# Patient Record
Sex: Female | Born: 1965 | Race: Black or African American | Hispanic: No | Marital: Single | Smoking: Current some day smoker
Health system: Southern US, Community
[De-identification: ages and names within clinical notes are randomized; demographics above are authoritative.]

## PROBLEM LIST (undated history)

## (undated) ENCOUNTER — Emergency Department (HOSPITAL_COMMUNITY): Payer: Self-pay

## (undated) DIAGNOSIS — I509 Heart failure, unspecified: Secondary | ICD-10-CM

## (undated) DIAGNOSIS — D573 Sickle-cell trait: Secondary | ICD-10-CM

## (undated) DIAGNOSIS — F329 Major depressive disorder, single episode, unspecified: Secondary | ICD-10-CM

## (undated) DIAGNOSIS — E669 Obesity, unspecified: Secondary | ICD-10-CM

## (undated) DIAGNOSIS — I639 Cerebral infarction, unspecified: Secondary | ICD-10-CM

## (undated) DIAGNOSIS — I10 Essential (primary) hypertension: Secondary | ICD-10-CM

## (undated) DIAGNOSIS — F32A Depression, unspecified: Secondary | ICD-10-CM

## (undated) HISTORY — PX: NO PAST SURGERIES: SHX2092

---

## 1998-05-12 ENCOUNTER — Emergency Department (HOSPITAL_COMMUNITY): Admission: EM | Admit: 1998-05-12 | Discharge: 1998-05-12 | Payer: Self-pay | Admitting: Emergency Medicine

## 1998-07-18 ENCOUNTER — Emergency Department (HOSPITAL_COMMUNITY): Admission: EM | Admit: 1998-07-18 | Discharge: 1998-07-18 | Payer: Self-pay | Admitting: Emergency Medicine

## 1998-09-05 ENCOUNTER — Emergency Department (HOSPITAL_COMMUNITY): Admission: EM | Admit: 1998-09-05 | Discharge: 1998-09-05 | Payer: Self-pay | Admitting: Emergency Medicine

## 1999-11-03 ENCOUNTER — Emergency Department (HOSPITAL_COMMUNITY): Admission: EM | Admit: 1999-11-03 | Discharge: 1999-11-03 | Payer: Self-pay | Admitting: Emergency Medicine

## 1999-11-11 ENCOUNTER — Emergency Department (HOSPITAL_COMMUNITY): Admission: EM | Admit: 1999-11-11 | Discharge: 1999-11-11 | Payer: Self-pay | Admitting: Emergency Medicine

## 1999-12-15 ENCOUNTER — Emergency Department (HOSPITAL_COMMUNITY): Admission: EM | Admit: 1999-12-15 | Discharge: 1999-12-15 | Payer: Self-pay | Admitting: Emergency Medicine

## 2000-01-29 ENCOUNTER — Emergency Department (HOSPITAL_COMMUNITY): Admission: EM | Admit: 2000-01-29 | Discharge: 2000-01-29 | Payer: Self-pay | Admitting: Emergency Medicine

## 2000-10-16 ENCOUNTER — Emergency Department (HOSPITAL_COMMUNITY): Admission: EM | Admit: 2000-10-16 | Discharge: 2000-10-16 | Payer: Self-pay | Admitting: Emergency Medicine

## 2000-10-23 ENCOUNTER — Encounter: Payer: Self-pay | Admitting: Emergency Medicine

## 2000-10-23 ENCOUNTER — Emergency Department (HOSPITAL_COMMUNITY): Admission: EM | Admit: 2000-10-23 | Discharge: 2000-10-23 | Payer: Self-pay | Admitting: Emergency Medicine

## 2001-01-07 ENCOUNTER — Emergency Department (HOSPITAL_COMMUNITY): Admission: EM | Admit: 2001-01-07 | Discharge: 2001-01-07 | Payer: Self-pay | Admitting: Emergency Medicine

## 2001-01-07 ENCOUNTER — Encounter: Payer: Self-pay | Admitting: Emergency Medicine

## 2001-01-09 ENCOUNTER — Emergency Department (HOSPITAL_COMMUNITY): Admission: EM | Admit: 2001-01-09 | Discharge: 2001-01-09 | Payer: Self-pay | Admitting: Emergency Medicine

## 2001-01-09 ENCOUNTER — Encounter: Payer: Self-pay | Admitting: Emergency Medicine

## 2001-07-29 ENCOUNTER — Emergency Department (HOSPITAL_COMMUNITY): Admission: EM | Admit: 2001-07-29 | Discharge: 2001-07-29 | Payer: Self-pay | Admitting: Emergency Medicine

## 2001-07-29 ENCOUNTER — Encounter: Payer: Self-pay | Admitting: Emergency Medicine

## 2001-10-21 ENCOUNTER — Emergency Department (HOSPITAL_COMMUNITY): Admission: EM | Admit: 2001-10-21 | Discharge: 2001-10-22 | Payer: Self-pay | Admitting: Emergency Medicine

## 2001-10-22 ENCOUNTER — Encounter: Payer: Self-pay | Admitting: Emergency Medicine

## 2002-06-15 ENCOUNTER — Emergency Department (HOSPITAL_COMMUNITY): Admission: EM | Admit: 2002-06-15 | Discharge: 2002-06-15 | Payer: Self-pay | Admitting: *Deleted

## 2002-06-23 ENCOUNTER — Encounter: Payer: Self-pay | Admitting: Emergency Medicine

## 2002-06-23 ENCOUNTER — Emergency Department (HOSPITAL_COMMUNITY): Admission: EM | Admit: 2002-06-23 | Discharge: 2002-06-23 | Payer: Self-pay | Admitting: Emergency Medicine

## 2002-06-29 ENCOUNTER — Emergency Department (HOSPITAL_COMMUNITY): Admission: EM | Admit: 2002-06-29 | Discharge: 2002-06-29 | Payer: Self-pay | Admitting: *Deleted

## 2002-07-01 ENCOUNTER — Emergency Department (HOSPITAL_COMMUNITY): Admission: EM | Admit: 2002-07-01 | Discharge: 2002-07-01 | Payer: Self-pay | Admitting: Emergency Medicine

## 2004-10-24 ENCOUNTER — Emergency Department (HOSPITAL_COMMUNITY): Admission: EM | Admit: 2004-10-24 | Discharge: 2004-10-24 | Payer: Self-pay | Admitting: Emergency Medicine

## 2005-05-15 ENCOUNTER — Emergency Department (HOSPITAL_COMMUNITY): Admission: EM | Admit: 2005-05-15 | Discharge: 2005-05-15 | Payer: Self-pay | Admitting: Emergency Medicine

## 2006-06-01 ENCOUNTER — Emergency Department (HOSPITAL_COMMUNITY): Admission: EM | Admit: 2006-06-01 | Discharge: 2006-06-01 | Payer: Self-pay | Admitting: Emergency Medicine

## 2006-06-04 ENCOUNTER — Emergency Department (HOSPITAL_COMMUNITY): Admission: EM | Admit: 2006-06-04 | Discharge: 2006-06-04 | Payer: Self-pay | Admitting: Emergency Medicine

## 2006-06-26 ENCOUNTER — Emergency Department (HOSPITAL_COMMUNITY): Admission: EM | Admit: 2006-06-26 | Discharge: 2006-06-26 | Payer: Self-pay | Admitting: Emergency Medicine

## 2006-09-26 ENCOUNTER — Emergency Department (HOSPITAL_COMMUNITY): Admission: EM | Admit: 2006-09-26 | Discharge: 2006-09-27 | Payer: Self-pay | Admitting: Emergency Medicine

## 2007-01-12 ENCOUNTER — Emergency Department (HOSPITAL_COMMUNITY): Admission: EM | Admit: 2007-01-12 | Discharge: 2007-01-12 | Payer: Self-pay | Admitting: Emergency Medicine

## 2007-04-27 ENCOUNTER — Emergency Department (HOSPITAL_COMMUNITY): Admission: EM | Admit: 2007-04-27 | Discharge: 2007-04-27 | Payer: Self-pay | Admitting: Emergency Medicine

## 2007-09-29 ENCOUNTER — Emergency Department (HOSPITAL_COMMUNITY): Admission: EM | Admit: 2007-09-29 | Discharge: 2007-09-29 | Payer: Self-pay | Admitting: Emergency Medicine

## 2007-10-06 ENCOUNTER — Ambulatory Visit: Payer: Self-pay | Admitting: *Deleted

## 2007-11-14 ENCOUNTER — Ambulatory Visit: Payer: Self-pay | Admitting: Internal Medicine

## 2007-11-14 ENCOUNTER — Encounter (INDEPENDENT_AMBULATORY_CARE_PROVIDER_SITE_OTHER): Payer: Self-pay | Admitting: Nurse Practitioner

## 2007-11-14 LAB — CONVERTED CEMR LAB
BUN: 19 mg/dL (ref 6–23)
CO2: 20 meq/L (ref 19–32)
Eosinophils Relative: 3 % (ref 0–5)
Glucose, Bld: 103 mg/dL — ABNORMAL HIGH (ref 70–99)
HCT: 40.4 % (ref 36.0–46.0)
Hemoglobin: 14 g/dL (ref 12.0–15.0)
Lymphocytes Relative: 40 % (ref 12–46)
Lymphs Abs: 2.8 10*3/uL (ref 0.7–4.0)
Monocytes Absolute: 0.8 10*3/uL (ref 0.1–1.0)
Monocytes Relative: 11 % (ref 3–12)
Platelets: 241 10*3/uL (ref 150–400)
RBC: 4.69 M/uL (ref 3.87–5.11)
Sodium: 141 meq/L (ref 135–145)
TSH: 1.061 microintl units/mL (ref 0.350–5.50)
Total Bilirubin: 0.2 mg/dL — ABNORMAL LOW (ref 0.3–1.2)
Total Protein: 6.9 g/dL (ref 6.0–8.3)
WBC: 6.9 10*3/uL (ref 4.0–10.5)

## 2007-11-25 ENCOUNTER — Ambulatory Visit (HOSPITAL_COMMUNITY): Admission: RE | Admit: 2007-11-25 | Discharge: 2007-11-25 | Payer: Self-pay | Admitting: Family Medicine

## 2007-11-27 ENCOUNTER — Emergency Department (HOSPITAL_COMMUNITY): Admission: EM | Admit: 2007-11-27 | Discharge: 2007-11-27 | Payer: Self-pay | Admitting: Emergency Medicine

## 2008-02-02 ENCOUNTER — Ambulatory Visit: Payer: Self-pay | Admitting: Internal Medicine

## 2008-02-09 ENCOUNTER — Ambulatory Visit: Payer: Self-pay | Admitting: Internal Medicine

## 2008-02-18 ENCOUNTER — Ambulatory Visit: Payer: Self-pay | Admitting: Internal Medicine

## 2008-05-04 ENCOUNTER — Encounter: Admission: RE | Admit: 2008-05-04 | Discharge: 2008-05-04 | Payer: Self-pay | Admitting: Family Medicine

## 2008-08-14 ENCOUNTER — Emergency Department (HOSPITAL_COMMUNITY): Admission: EM | Admit: 2008-08-14 | Discharge: 2008-08-14 | Payer: Self-pay | Admitting: Emergency Medicine

## 2008-09-04 ENCOUNTER — Emergency Department (HOSPITAL_COMMUNITY): Admission: EM | Admit: 2008-09-04 | Discharge: 2008-09-04 | Payer: Self-pay | Admitting: Emergency Medicine

## 2008-09-07 ENCOUNTER — Ambulatory Visit: Payer: Self-pay | Admitting: Family Medicine

## 2008-10-19 ENCOUNTER — Emergency Department (HOSPITAL_COMMUNITY): Admission: EM | Admit: 2008-10-19 | Discharge: 2008-10-19 | Payer: Self-pay | Admitting: Emergency Medicine

## 2008-11-04 ENCOUNTER — Encounter: Payer: Self-pay | Admitting: Family Medicine

## 2008-11-04 ENCOUNTER — Other Ambulatory Visit: Admission: RE | Admit: 2008-11-04 | Discharge: 2008-11-04 | Payer: Self-pay | Admitting: Obstetrics and Gynecology

## 2008-11-04 ENCOUNTER — Ambulatory Visit: Payer: Self-pay | Admitting: Obstetrics and Gynecology

## 2008-11-10 ENCOUNTER — Ambulatory Visit (HOSPITAL_COMMUNITY): Admission: RE | Admit: 2008-11-10 | Discharge: 2008-11-10 | Payer: Self-pay | Admitting: Obstetrics and Gynecology

## 2008-12-15 ENCOUNTER — Encounter: Admission: RE | Admit: 2008-12-15 | Discharge: 2008-12-15 | Payer: Self-pay | Admitting: Family Medicine

## 2009-01-14 ENCOUNTER — Emergency Department (HOSPITAL_COMMUNITY): Admission: EM | Admit: 2009-01-14 | Discharge: 2009-01-14 | Payer: Self-pay | Admitting: Emergency Medicine

## 2009-03-15 ENCOUNTER — Emergency Department (HOSPITAL_COMMUNITY): Admission: EM | Admit: 2009-03-15 | Discharge: 2009-03-16 | Payer: Self-pay | Admitting: Emergency Medicine

## 2009-03-16 ENCOUNTER — Inpatient Hospital Stay (HOSPITAL_COMMUNITY): Admission: AD | Admit: 2009-03-16 | Discharge: 2009-03-16 | Payer: Self-pay | Admitting: Obstetrics and Gynecology

## 2009-03-16 ENCOUNTER — Ambulatory Visit: Payer: Self-pay | Admitting: Obstetrics and Gynecology

## 2009-05-02 ENCOUNTER — Emergency Department (HOSPITAL_COMMUNITY): Admission: EM | Admit: 2009-05-02 | Discharge: 2009-05-02 | Payer: Self-pay | Admitting: Emergency Medicine

## 2009-05-05 ENCOUNTER — Ambulatory Visit: Payer: Self-pay | Admitting: Family Medicine

## 2009-08-07 ENCOUNTER — Emergency Department (HOSPITAL_COMMUNITY): Admission: EM | Admit: 2009-08-07 | Discharge: 2009-08-07 | Payer: Self-pay | Admitting: Emergency Medicine

## 2009-09-27 ENCOUNTER — Emergency Department (HOSPITAL_COMMUNITY): Admission: EM | Admit: 2009-09-27 | Discharge: 2009-09-27 | Payer: Self-pay | Admitting: Emergency Medicine

## 2010-03-23 ENCOUNTER — Emergency Department (HOSPITAL_COMMUNITY): Admission: EM | Admit: 2010-03-23 | Discharge: 2010-03-23 | Payer: Self-pay | Admitting: Emergency Medicine

## 2010-03-29 ENCOUNTER — Emergency Department (HOSPITAL_COMMUNITY): Admission: EM | Admit: 2010-03-29 | Discharge: 2010-03-29 | Payer: Self-pay | Admitting: Emergency Medicine

## 2010-04-08 ENCOUNTER — Emergency Department (HOSPITAL_COMMUNITY): Admission: EM | Admit: 2010-04-08 | Discharge: 2010-04-08 | Payer: Self-pay | Admitting: Emergency Medicine

## 2010-11-12 ENCOUNTER — Encounter: Payer: Self-pay | Admitting: Family Medicine

## 2010-11-13 ENCOUNTER — Encounter: Payer: Self-pay | Admitting: Family Medicine

## 2011-01-08 LAB — POCT I-STAT, CHEM 8
Calcium, Ion: 1.1 mmol/L — ABNORMAL LOW (ref 1.12–1.32)
Chloride: 109 mEq/L (ref 96–112)
HCT: 42 % (ref 36.0–46.0)
Hemoglobin: 14.3 g/dL (ref 12.0–15.0)
Potassium: 3.9 mEq/L (ref 3.5–5.1)
Sodium: 142 mEq/L (ref 135–145)
TCO2: 26 mmol/L (ref 0–100)

## 2011-01-08 LAB — RAPID URINE DRUG SCREEN, HOSP PERFORMED
Amphetamines: NOT DETECTED
Tetrahydrocannabinol: NOT DETECTED

## 2011-01-08 LAB — POCT CARDIAC MARKERS: Myoglobin, poc: 74.1 ng/mL (ref 12–200)

## 2011-01-08 LAB — CBC
MCHC: 33.6 g/dL (ref 30.0–36.0)
MCV: 87.4 fL (ref 78.0–100.0)
Platelets: 201 10*3/uL (ref 150–400)
WBC: 7.9 10*3/uL (ref 4.0–10.5)

## 2011-01-08 LAB — DIFFERENTIAL
Basophils Relative: 1 % (ref 0–1)
Eosinophils Absolute: 0.2 10*3/uL (ref 0.0–0.7)
Neutrophils Relative %: 46 % (ref 43–77)

## 2011-01-08 LAB — D-DIMER, QUANTITATIVE: D-Dimer, Quant: 0.33 ug/mL-FEU (ref 0.00–0.48)

## 2011-01-30 LAB — URINE MICROSCOPIC-ADD ON

## 2011-01-30 LAB — DIFFERENTIAL
Basophils Absolute: 0 10*3/uL (ref 0.0–0.1)
Eosinophils Relative: 4 % (ref 0–5)
Lymphocytes Relative: 38 % (ref 12–46)
Lymphs Abs: 3.7 10*3/uL (ref 0.7–4.0)
Neutro Abs: 4.8 10*3/uL (ref 1.7–7.7)
Neutrophils Relative %: 50 % (ref 43–77)

## 2011-01-30 LAB — URINALYSIS, ROUTINE W REFLEX MICROSCOPIC
Ketones, ur: NEGATIVE mg/dL
Leukocytes, UA: NEGATIVE
Nitrite: NEGATIVE
Specific Gravity, Urine: 1.015 (ref 1.005–1.030)
Urobilinogen, UA: 0.2 mg/dL (ref 0.0–1.0)
pH: 5.5 (ref 5.0–8.0)

## 2011-01-30 LAB — POCT CARDIAC MARKERS
Myoglobin, poc: 80 ng/mL (ref 12–200)
Troponin i, poc: <0.05 ng/mL (ref 0.00–0.09)

## 2011-01-30 LAB — POCT I-STAT, CHEM 8
BUN: 15 mg/dL (ref 6–23)
Chloride: 109 mEq/L (ref 96–112)
HCT: 44 % (ref 36.0–46.0)
Potassium: 3.7 mEq/L (ref 3.5–5.1)
Sodium: 139 mEq/L (ref 135–145)

## 2011-01-30 LAB — CBC
HCT: 43 % (ref 36.0–46.0)
Platelets: 250 10*3/uL (ref 150–400)
WBC: 9.6 10*3/uL (ref 4.0–10.5)

## 2011-01-30 LAB — WET PREP, GENITAL: WBC, Wet Prep HPF POC: NONE SEEN

## 2011-01-30 LAB — URINE CULTURE: Colony Count: 4000

## 2011-01-30 LAB — POCT PREGNANCY, URINE: Preg Test, Ur: NEGATIVE

## 2011-01-30 LAB — LIPASE, BLOOD: Lipase: 21 U/L (ref 11–59)

## 2011-02-01 LAB — COMPREHENSIVE METABOLIC PANEL
AST: 20 U/L (ref 0–37)
Albumin: 3.4 g/dL — ABNORMAL LOW (ref 3.5–5.2)
BUN: 9 mg/dL (ref 6–23)
Calcium: 8.6 mg/dL (ref 8.4–10.5)
Chloride: 108 mEq/L (ref 96–112)
Creatinine, Ser: 0.8 mg/dL (ref 0.4–1.2)
GFR calc Af Amer: >60 mL/min (ref >60)
Total Protein: 6.6 g/dL (ref 6.0–8.3)

## 2011-02-01 LAB — GC/CHLAMYDIA PROBE AMP, GENITAL: GC Probe Amp, Genital: NEGATIVE

## 2011-02-01 LAB — URINE MICROSCOPIC-ADD ON

## 2011-02-01 LAB — DIFFERENTIAL
Basophils Absolute: 0 10*3/uL (ref 0.0–0.1)
Eosinophils Relative: 4 % (ref 0–5)
Lymphocytes Relative: 36 % (ref 12–46)
Lymphs Abs: 2.5 10*3/uL (ref 0.7–4.0)
Monocytes Absolute: 0.9 10*3/uL (ref 0.1–1.0)
Monocytes Relative: 12 % (ref 3–12)
Neutro Abs: 3.4 10*3/uL (ref 1.7–7.7)

## 2011-02-01 LAB — URINE CULTURE
Colony Count: NO GROWTH
Culture: NO GROWTH

## 2011-02-01 LAB — CBC
HCT: 43.1 % (ref 36.0–46.0)
MCHC: 34.6 g/dL (ref 30.0–36.0)
MCV: 88.4 fL (ref 78.0–100.0)
Platelets: 217 10*3/uL (ref 150–400)
RDW: 14.1 % (ref 11.5–15.5)
WBC: 7.1 10*3/uL (ref 4.0–10.5)

## 2011-02-01 LAB — URINALYSIS, ROUTINE W REFLEX MICROSCOPIC
Bilirubin Urine: NEGATIVE
Glucose, UA: NEGATIVE mg/dL
Ketones, ur: 15 mg/dL — AB
Nitrite: NEGATIVE
Protein, ur: 100 mg/dL — AB

## 2011-02-01 LAB — WET PREP, GENITAL

## 2011-02-08 ENCOUNTER — Emergency Department (HOSPITAL_COMMUNITY): Payer: Self-pay

## 2011-02-08 ENCOUNTER — Emergency Department (HOSPITAL_COMMUNITY)
Admission: EM | Admit: 2011-02-08 | Discharge: 2011-02-08 | Disposition: A | Discharge status: Home / Self Care | Payer: Self-pay | Attending: Emergency Medicine | Admitting: Emergency Medicine

## 2011-02-08 DIAGNOSIS — H5789 Other specified disorders of eye and adnexa: Secondary | ICD-10-CM | POA: Insufficient documentation

## 2011-02-08 DIAGNOSIS — J45909 Unspecified asthma, uncomplicated: Secondary | ICD-10-CM | POA: Insufficient documentation

## 2011-02-08 DIAGNOSIS — Z760 Encounter for issue of repeat prescription: Secondary | ICD-10-CM | POA: Insufficient documentation

## 2011-02-08 DIAGNOSIS — H05019 Cellulitis of unspecified orbit: Secondary | ICD-10-CM | POA: Insufficient documentation

## 2011-02-08 DIAGNOSIS — H538 Other visual disturbances: Secondary | ICD-10-CM | POA: Insufficient documentation

## 2011-02-08 DIAGNOSIS — I10 Essential (primary) hypertension: Secondary | ICD-10-CM | POA: Insufficient documentation

## 2011-02-08 DIAGNOSIS — M7989 Other specified soft tissue disorders: Secondary | ICD-10-CM | POA: Insufficient documentation

## 2011-02-08 DIAGNOSIS — R51 Headache: Secondary | ICD-10-CM | POA: Insufficient documentation

## 2011-02-08 LAB — CBC
Hemoglobin: 14.4 g/dL (ref 12.0–15.0)
MCHC: 35.5 g/dL (ref 30.0–36.0)
RDW: 14.2 % (ref 11.5–15.5)
WBC: 6.2 10*3/uL (ref 4.0–10.5)

## 2011-02-08 LAB — POCT I-STAT, CHEM 8
Creatinine, Ser: 1.1 mg/dL (ref 0.4–1.2)
HCT: 42 % (ref 36.0–46.0)
Hemoglobin: 14.3 g/dL (ref 12.0–15.0)
Potassium: 4 mEq/L (ref 3.5–5.1)
Sodium: 141 mEq/L (ref 135–145)
TCO2: 24 mmol/L (ref 0–100)

## 2011-02-08 LAB — DIFFERENTIAL
Basophils Absolute: 0 10*3/uL (ref 0.0–0.1)
Basophils Relative: 0 % (ref 0–1)
Eosinophils Relative: 5 % (ref 0–5)
Lymphocytes Relative: 33 % (ref 12–46)
Monocytes Absolute: 0.7 10*3/uL (ref 0.1–1.0)
Neutro Abs: 3.1 10*3/uL (ref 1.7–7.7)

## 2011-03-06 NOTE — Group Therapy Note (Signed)
NAME:  Taylor Vazquez, PASK NO.:  [id number]   MEDICAL RECORD NO.:  [id number]          PATIENT TYPE:  WOC   LOCATION:  [facility]                   FACILITY:  [facility]   PHYSICIAN:  Argentina Donovan, MD        DATE OF BIRTH:  Jun 07, 1966   DATE OF SERVICE:  11/04/2008                                  CLINIC NOTE   REASON FOR VISIT:  Ms. Taylor Vazquez is here for Pap smear, evaluation of  right pelvic pain as well as evaluation of vulvar mass that has been  present for quite some time.   HISTORY OF PRESENT ILLNESS:  Ms. Taylor Vazquez is a 45 year old gravida 5,  para -4-0-1-4 who has an approximate 1-year history of right lower  pelvic pain that waxes and wanes.  She notes several months ago she was  seen in the ER for this.  An ultrasound at that time revealed a large  right ovarian cyst.  She is here today for followup of that.  She also  describes a lesion on her left vulva that has been present for several  years but over the last year has been increasing in size.  Additionally,  at her primary care Mariadejesus Cade visit, they were not able to visualize her  cervix so she is requesting a Pap smear today as well.   PAST MEDICAL HISTORY:  Is significant for hypertension.   PAST SURGICAL HISTORY:  None.   PAST OBSTETRICAL HISTORY:  She has a history of four vaginal term  deliveries and one miscarriage.  Her last menstrual period started  today, 11/04/2008.  She has a history of a tubal ligation.   She admits to being a smoker.   PHYSICAL EXAM:  Blood pressure 115/79, heart rate 96, respiration rate  20, temperature 97.6.  She is 5 feet 3 inches tall and weighs 214  pounds.  GENERAL APPEARANCE:  Healthy appearing in no acute distress.  LUNGS:  Were clear to auscultation.  CARDIOVASCULAR EXAM:  Regular rate without murmur.  ABDOMEN:  Soft, obese with right lower quadrant tenderness.  There is no  rebound or guarding.  GU EXAM:  Reveals normal external female genitalia.  The vaginal  mucosa  is normal with normal rugae.  The cervix was visualized which appeared  multiparous.  No lesion identified.  A Pap smear was done.  Bimanual  exam revealed a normal-sized uterus.  The adnexa were not palpated  secondary to the patient's body habitus.  There is a 4 cm pedunculated  skin mass protruding from the middle aspect of the left vulva.   The patient was counseled on the risks and benefits of an office skin  biopsy of her left vulvar lesion.  The risks to include but not limited  to bleeding, infection, pain, scarring and recurrence of the lesion.  The patient voiced understanding of these risks and desires to proceed  with the procedure.  The patient was then anesthetized with 2 mL of 1%  lidocaine with epinephrine.  She was then prepped and draped in the  usual sterile manner.  The 4 cm vulvar mass was removed with a scalpel  at the base.  The specimen was sent to pathology.  The small skin  opening was repaired with 3-0 Vicryl suture, simple interrupted.  Wound  care was discussed with the patient.  The patient will return in 2 weeks  for a discussion of the biopsy results.  The blood loss was minimal.  The patient tolerated the procedure well and there were no  complications.   ASSESSMENT/PLAN:  1. Right ovarian cyst.  The patient had ultrasound revealing a 6 cm      right ovarian cyst and has not yet had followup.  A repeat      ultrasound was scheduled prior to leaving the office today.  2. Vulvar skin tag removed as per procedure note above.  The patient      will follow up in 2 weeks for results.  3. Well-woman exam.  Pap smear was done today.  The patient will      follow up for results or receive a letter in the mail.      Additionally, her breast exam and mammogram were ordered through      her primary care Wacey Zieger at [facility].     ______________________________  Odie Sera, D.O.    ______________________________  Argentina Donovan, MD    MC/MEDQ   D:  11/04/2008  T:  11/04/2008  Job:  [id number]

## 2011-03-11 ENCOUNTER — Emergency Department (HOSPITAL_COMMUNITY)
Admission: EM | Admit: 2011-03-11 | Discharge: 2011-03-11 | Disposition: A | Discharge status: Home / Self Care | Payer: Self-pay | Attending: Emergency Medicine | Admitting: Emergency Medicine

## 2011-03-11 DIAGNOSIS — K089 Disorder of teeth and supporting structures, unspecified: Secondary | ICD-10-CM | POA: Insufficient documentation

## 2011-03-11 DIAGNOSIS — L509 Urticaria, unspecified: Secondary | ICD-10-CM | POA: Insufficient documentation

## 2011-03-11 DIAGNOSIS — J45909 Unspecified asthma, uncomplicated: Secondary | ICD-10-CM | POA: Insufficient documentation

## 2011-03-11 DIAGNOSIS — Z79899 Other long term (current) drug therapy: Secondary | ICD-10-CM | POA: Insufficient documentation

## 2011-03-11 DIAGNOSIS — I10 Essential (primary) hypertension: Secondary | ICD-10-CM | POA: Insufficient documentation

## 2011-03-11 DIAGNOSIS — L2989 Other pruritus: Secondary | ICD-10-CM | POA: Insufficient documentation

## 2011-03-11 DIAGNOSIS — L298 Other pruritus: Secondary | ICD-10-CM | POA: Insufficient documentation

## 2011-03-26 ENCOUNTER — Emergency Department (HOSPITAL_COMMUNITY)
Admission: EM | Admit: 2011-03-26 | Discharge: 2011-03-26 | Discharge status: Against Medical Advice | Payer: Self-pay | Attending: Emergency Medicine | Admitting: Emergency Medicine

## 2011-03-26 DIAGNOSIS — Z0389 Encounter for observation for other suspected diseases and conditions ruled out: Secondary | ICD-10-CM | POA: Insufficient documentation

## 2011-03-26 LAB — URINALYSIS, ROUTINE W REFLEX MICROSCOPIC
Ketones, ur: NEGATIVE mg/dL
Leukocytes, UA: NEGATIVE
Nitrite: NEGATIVE
Protein, ur: NEGATIVE mg/dL

## 2011-03-26 LAB — URINE MICROSCOPIC-ADD ON

## 2011-03-26 LAB — POCT PREGNANCY, URINE: Preg Test, Ur: NEGATIVE

## 2011-07-02 ENCOUNTER — Other Ambulatory Visit: Payer: Self-pay | Admitting: Family Medicine

## 2011-07-02 DIAGNOSIS — N63 Unspecified lump in unspecified breast: Secondary | ICD-10-CM

## 2011-07-25 LAB — URINALYSIS, ROUTINE W REFLEX MICROSCOPIC
Bilirubin Urine: NEGATIVE
Glucose, UA: NEGATIVE
Ketones, ur: NEGATIVE
Leukocytes, UA: NEGATIVE
Protein, ur: 30 — AB
pH: 6

## 2011-07-25 LAB — WET PREP, GENITAL
Trich, Wet Prep: NONE SEEN
Yeast Wet Prep HPF POC: NONE SEEN

## 2011-07-25 LAB — POCT PREGNANCY, URINE: Preg Test, Ur: NEGATIVE

## 2011-07-25 LAB — DIFFERENTIAL
Eosinophils Absolute: 0.1
Lymphs Abs: 3
Neutro Abs: 3.7
Neutrophils Relative %: 49

## 2011-07-25 LAB — COMPREHENSIVE METABOLIC PANEL
ALT: 26
BUN: 6
CO2: 23
Calcium: 9.3
Creatinine, Ser: 0.81
GFR calc non Af Amer: >60
Glucose, Bld: 95
Sodium: 139

## 2011-07-25 LAB — CBC
HCT: 47 — ABNORMAL HIGH
Hemoglobin: 15.6 — ABNORMAL HIGH
MCHC: 33.3
MCV: 88.7
RBC: 5.3 — ABNORMAL HIGH
RDW: 14.4

## 2011-07-25 LAB — GC/CHLAMYDIA PROBE AMP, GENITAL: GC Probe Amp, Genital: NEGATIVE

## 2011-07-30 LAB — DIFFERENTIAL
Eosinophils Relative: 4
Lymphocytes Relative: 39
Lymphs Abs: 3.1
Monocytes Absolute: 1

## 2011-07-30 LAB — I-STAT 8, (EC8 V) (CONVERTED LAB)
Glucose, Bld: 96
Potassium: 4.1
TCO2: 27
pCO2, Ven: 47.5
pH, Ven: 7.336 — ABNORMAL HIGH

## 2011-07-30 LAB — CBC
HCT: 39.2
Hemoglobin: 13.4
MCHC: 33.8
MCV: 87.4
MCV: 88.4
RBC: 4.79
RDW: 14
RDW: 14.2
WBC: 8

## 2011-07-30 LAB — POCT I-STAT CREATININE: Operator id: 294501

## 2011-08-14 ENCOUNTER — Emergency Department (HOSPITAL_COMMUNITY): Payer: Self-pay

## 2011-08-14 ENCOUNTER — Emergency Department (HOSPITAL_COMMUNITY)
Admission: EM | Admit: 2011-08-14 | Discharge: 2011-08-14 | Disposition: A | Discharge status: Home / Self Care | Payer: Self-pay | Attending: Emergency Medicine | Admitting: Emergency Medicine

## 2011-08-14 DIAGNOSIS — F411 Generalized anxiety disorder: Secondary | ICD-10-CM | POA: Insufficient documentation

## 2011-08-14 DIAGNOSIS — R079 Chest pain, unspecified: Secondary | ICD-10-CM | POA: Insufficient documentation

## 2011-08-14 DIAGNOSIS — M545 Low back pain, unspecified: Secondary | ICD-10-CM | POA: Insufficient documentation

## 2011-08-14 DIAGNOSIS — F17200 Nicotine dependence, unspecified, uncomplicated: Secondary | ICD-10-CM | POA: Insufficient documentation

## 2011-08-14 DIAGNOSIS — J45909 Unspecified asthma, uncomplicated: Secondary | ICD-10-CM | POA: Insufficient documentation

## 2011-08-14 DIAGNOSIS — R0609 Other forms of dyspnea: Secondary | ICD-10-CM | POA: Insufficient documentation

## 2011-08-14 DIAGNOSIS — R0602 Shortness of breath: Secondary | ICD-10-CM | POA: Insufficient documentation

## 2011-08-14 DIAGNOSIS — R0989 Other specified symptoms and signs involving the circulatory and respiratory systems: Secondary | ICD-10-CM | POA: Insufficient documentation

## 2011-08-14 LAB — URINE MICROSCOPIC-ADD ON

## 2011-08-14 LAB — BASIC METABOLIC PANEL
BUN: 8 mg/dL (ref 6–23)
Calcium: 9 mg/dL (ref 8.4–10.5)
GFR calc non Af Amer: 76 mL/min — ABNORMAL LOW (ref >90)
Glucose, Bld: 90 mg/dL (ref 70–99)
Potassium: 3.8 mEq/L (ref 3.5–5.1)

## 2011-08-14 LAB — URINALYSIS, ROUTINE W REFLEX MICROSCOPIC
Bilirubin Urine: NEGATIVE
Specific Gravity, Urine: 1.01 (ref 1.005–1.030)
Urobilinogen, UA: 0.2 mg/dL (ref 0.0–1.0)

## 2011-08-14 LAB — CBC
HCT: 41.5 % (ref 36.0–46.0)
Hemoglobin: 14.2 g/dL (ref 12.0–15.0)
MCH: 28.7 pg (ref 26.0–34.0)
MCHC: 34.2 g/dL (ref 30.0–36.0)
MCV: 83.8 fL (ref 78.0–100.0)

## 2011-08-14 LAB — DIFFERENTIAL
Lymphocytes Relative: 41 % (ref 12–46)
Monocytes Absolute: 0.8 10*3/uL (ref 0.1–1.0)
Monocytes Relative: 11 % (ref 3–12)
Neutro Abs: 3.1 10*3/uL (ref 1.7–7.7)

## 2011-08-23 NOTE — Consult Note (Signed)
NAMEGENTRY, SEEBER NO.:  [id number]  MEDICAL RECORD NO.:  [id number]  LOCATION:  [facility]                         FACILITY:  [facility]  PHYSICIAN:  Thurmon Fair, MD     DATE OF BIRTH:  1966-07-27  DATE OF CONSULTATION:  08/14/2011 DATE OF DISCHARGE:  08/14/2011                                CONSULTATION   Asked by ER physician to see 45 year old African American female secondary to chest pain and back pain and right flank pain.  HISTORY OF PRESENT ILLNESS:  A 45 year old African American female presented to [facility] Emergency Room today complaining of sharp left anterior chest pain and some chest heaviness with shortness of breath. This has been going on off and on for 1 week, sometimes does increase with a deep breath.  No nausea, diaphoresis.  Also complains of back pain, right flank with radiation to the right anterior lower quadrant. The patient stated she saw cardiologist sometime ago, was here at [facility] 1 year ago, and was told she had an MI and stated she was in the hospital for 1 week.  I cannot find any records in [place] in access anywhere nor in e-chart.  I find multiple ER notes and one ER note 1 year ago,when the patient was seen with chest pain.  She was negative for MI, but positive for cocaine at that time, and was discharged home from the emergency room.   Here in the ER, she received Dilaudid and Valium and in the course of the interview with the patient, she stated she was 6-[redacted] weeks pregnant and that she was supposed to see the obstetrician on Friday.  It was not a planned pregnancy.  Also new in her history from the ER is sickle cell anemia and asked the patient about this, states she was sickle cell, had not had an exacerbation in years, but again I find no previous history in our medical records at this time.  PAST MEDICAL HISTORY:  Asthma, prior chest pain with cocaine use, and hypertension.  OUTPATIENT MEDICATIONS:  Albuterol  and lisinopril.  FAMILY HISTORY:  Positive for coronary artery disease with mother, father, and 1 brother.  SOCIAL HISTORY:  She lives with a boyfriend, she has 4 grown children, 3 grandchildren, 1 daughter was born with a hole in her heart.  The patient smokes 2 cigarettes a day, uses social alcohol, but no street drugs.  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  REVIEW OF SYSTEMS:  GENERAL:  No colds or fevers.  SKIN:  No rashes. HEME:  No recent episodes of known sickle cell exacerbations.  HEENT: No blurred vision.  CARDIOVASCULAR:  As stated.  No palpitations. PULMONARY:  History of asthma.  MUSCULOSKELETAL:  Negative except for chest pain and back pain.  ENDOCRINE:  No diabetes or thyroid disease. NEURO:  No syncope.  PHYSICAL EXAMINATION:  VITAL SIGNS:  Today blood pressure 140/75, respirations 15, pulse 66, temp 98, oxygen saturation 98%. GENERAL:  Alert and oriented African American female, pleasant affect. SKIN:  Warm and dry.  Brisk capillary refill. HEENT:  Normocephalic.  Sclerae are clear. NECK:  Supple.  No JVD.  No bruits. HEART:  S1 and  S2.  Regular rate and rhythm without murmur, gallop, rub, or click. LUNGS:  Clear without rales, rhonchi, or wheezes. ABDOMEN:  Soft, nontender.  Positive bowel sounds.  I do not palpate liver, spleen, or masses. EXTREMITIES:  No edema. NEURO:  Alert and oriented x3.  Moves all extremities.  LABORATORY DATA:  Pregnancy urine screen was negative.  Troponin-I 0.01. Hemoglobin 14.2, hematocrit 41.5, WBC 6.9, platelets 243.  Sodium 140, potassium 3.8, BUN 8, creatinine 0.91, glucose 90.  Urinalysis was clear.  Chest x-ray, biapical bullous disease.  No acute infiltrates. EKG, sinus rhythm without acute changes from previous tracings.  IMPRESSION: 1. Atypical chest pain. 2. History of asthma. 3. Negative urine pregnancy.  The patient states she is pregnant.  PLAN:  Dr. Royann Shivers saw the patient and assessed her and felt stable for  discharge, from Cardiology standpoint, we will have her follow up in our office in 1-2 weeks.  Our office will call her with the date and time.  There are no abnormalities in her EKG and her troponin-I was negative.  her history it is unclear according to our documentation when comparing it to.  Nevertheless, we will see the patient in consult as an outpatient, and if she is admitted we will be glad to follow along, so please notify us of the admission.     Darcella Gasman. Annie Paras, N.P.   ______________________________ Thurmon Fair, MD    LRI/MEDQ  D:  08/14/2011  T:  08/14/2011  Job:  [id number]  Electronically Signed by Nada Boozer N.P. on 08/16/2011 09:57:31 AM Electronically Signed by Thurmon Fair M.D. on 08/23/2011 11:06:51 AM

## 2011-09-14 ENCOUNTER — Emergency Department (HOSPITAL_COMMUNITY)
Admission: EM | Admit: 2011-09-14 | Discharge: 2011-09-14 | Disposition: A | Discharge status: Home / Self Care | Payer: Self-pay | Attending: Emergency Medicine | Admitting: Emergency Medicine

## 2011-09-14 ENCOUNTER — Encounter: Payer: Self-pay | Admitting: *Deleted

## 2011-09-14 DIAGNOSIS — R109 Unspecified abdominal pain: Secondary | ICD-10-CM | POA: Insufficient documentation

## 2011-09-14 DIAGNOSIS — I10 Essential (primary) hypertension: Secondary | ICD-10-CM | POA: Insufficient documentation

## 2011-09-14 DIAGNOSIS — F17200 Nicotine dependence, unspecified, uncomplicated: Secondary | ICD-10-CM | POA: Insufficient documentation

## 2011-09-14 DIAGNOSIS — T148XXA Other injury of unspecified body region, initial encounter: Secondary | ICD-10-CM

## 2011-09-14 DIAGNOSIS — M545 Low back pain, unspecified: Secondary | ICD-10-CM | POA: Insufficient documentation

## 2011-09-14 DIAGNOSIS — M549 Dorsalgia, unspecified: Secondary | ICD-10-CM

## 2011-09-14 DIAGNOSIS — D573 Sickle-cell trait: Secondary | ICD-10-CM | POA: Insufficient documentation

## 2011-09-14 DIAGNOSIS — R112 Nausea with vomiting, unspecified: Secondary | ICD-10-CM | POA: Insufficient documentation

## 2011-09-14 HISTORY — DX: Sickle-cell trait: D57.3

## 2011-09-14 HISTORY — DX: Essential (primary) hypertension: I10

## 2011-09-14 LAB — CBC
Hemoglobin: 14 g/dL (ref 12.0–15.0)
MCH: 28.3 pg (ref 26.0–34.0)
RBC: 4.94 MIL/uL (ref 3.87–5.11)

## 2011-09-14 LAB — COMPREHENSIVE METABOLIC PANEL
Alkaline Phosphatase: 106 U/L (ref 39–117)
BUN: 14 mg/dL (ref 6–23)
CO2: 27 mEq/L (ref 19–32)
Chloride: 107 mEq/L (ref 96–112)
GFR calc Af Amer: 58 mL/min — ABNORMAL LOW (ref >90)
GFR calc non Af Amer: 50 mL/min — ABNORMAL LOW (ref >90)
Glucose, Bld: 91 mg/dL (ref 70–99)
Potassium: 4.1 mEq/L (ref 3.5–5.1)
Total Bilirubin: 0.2 mg/dL — ABNORMAL LOW (ref 0.3–1.2)

## 2011-09-14 LAB — URINALYSIS, ROUTINE W REFLEX MICROSCOPIC
Glucose, UA: NEGATIVE mg/dL
Ketones, ur: NEGATIVE mg/dL
Leukocytes, UA: NEGATIVE
Nitrite: NEGATIVE
Protein, ur: NEGATIVE mg/dL
Urobilinogen, UA: 0.2 mg/dL (ref 0.0–1.0)

## 2011-09-14 LAB — LIPASE, BLOOD: Lipase: 25 U/L (ref 11–59)

## 2011-09-14 LAB — DIFFERENTIAL
Eosinophils Absolute: 0.2 10*3/uL (ref 0.0–0.7)
Lymphocytes Relative: 43 % (ref 12–46)
Lymphs Abs: 2.9 10*3/uL (ref 0.7–4.0)
Monocytes Relative: 13 % — ABNORMAL HIGH (ref 3–12)
Neutro Abs: 2.7 10*3/uL (ref 1.7–7.7)
Neutrophils Relative %: 40 % — ABNORMAL LOW (ref 43–77)

## 2011-09-14 LAB — URINE MICROSCOPIC-ADD ON

## 2011-09-14 MED ORDER — SODIUM CHLORIDE 0.9 % IV BOLUS (SEPSIS)
1000.0000 mL | Freq: Once | INTRAVENOUS | Status: AC
  Administered 2011-09-14: 1000 mL via INTRAVENOUS

## 2011-09-14 MED ORDER — ONDANSETRON HCL 4 MG/2ML IJ SOLN
4.0000 mg | Freq: Once | INTRAMUSCULAR | Status: AC
  Administered 2011-09-14: 4 mg via INTRAVENOUS
  Filled 2011-09-14: qty 2

## 2011-09-14 MED ORDER — TRAMADOL HCL 50 MG PO TABS: 50.0000 mg | ORAL_TABLET | Freq: Four times a day (QID) | ORAL | Status: AC | PRN

## 2011-09-14 MED ORDER — MORPHINE SULFATE 4 MG/ML IJ SOLN
4.0000 mg | Freq: Once | INTRAMUSCULAR | Status: AC
  Administered 2011-09-14: 4 mg via INTRAVENOUS
  Filled 2011-09-14: qty 1

## 2011-09-14 MED ORDER — HYDROCHLOROTHIAZIDE 25 MG PO TABS: 12.5000 mg | ORAL_TABLET | Freq: Every day | ORAL | Status: DC

## 2011-09-14 NOTE — ED Provider Notes (Signed)
Medical screening examination/treatment/procedure(s) were performed by non-physician practitioner and as supervising physician I was immediately available for consultation/collaboration.   Gentry Pilson M Jakeem Grape, MD 09/14/11 1605 

## 2011-09-14 NOTE — ED Provider Notes (Signed)
History     CSN: [id number] Arrival date & time: 09/14/2011  8:55 AM   First MD Initiated Contact with Patient 09/14/11 1019     11:01 AM HPI Taylor Vazquez is a 45 y.o. female complaining of right mid to lower back pain with radiation to right flank. Reports pain has been constant since her last visit to the ED in October. States to her last visit she was told she may have gallstones and given Ultram for the pain which helped. Reports that she has run out of her Ultram and pain has returned. Reports pain associated with nausea and vomiting. States pain is constant and is not have any aggravating or alleviating factors. Denies fever, urinary symptoms, vaginal discharge, diarrhea, constipation, numbness, tingling, weakness,  perineal paresthesias, saddle anesthesias , or known injury. Reports she is to 3 months pregnant.  Patient is a 45 y.o. female presenting with back pain. The history is provided by the patient.  Back Pain  This is a chronic problem. Episode onset: Several weeks ago. The problem occurs constantly. The problem has not changed since onset.The pain is associated with no known injury. The quality of the pain is described as aching. The pain does not radiate. The pain is severe. The symptoms are aggravated by certain positions (Palpation). Pertinent negatives include no chest pain, no fever, no abdominal pain and no dysuria. Treatments tried: Ultram. The treatment provided significant relief.    Past Medical History  Diagnosis Date  . Sickle cell trait   . Hypertension     History reviewed. No pertinent past surgical history.  No family history on file.  History  Substance Use Topics  . Smoking status: Current Everyday Smoker  . Smokeless tobacco: Not on file  . Alcohol Use: Yes    OB History    Grav Para Term Preterm Abortions TAB SAB Ect Mult Living                  Review of Systems  Constitutional: Negative for fever and chills.  Respiratory: Negative for  shortness of breath.   Cardiovascular: Negative for chest pain.  Gastrointestinal: Positive for nausea and vomiting. Negative for abdominal pain, diarrhea and constipation.  Genitourinary: Positive for flank pain. Negative for dysuria, urgency, frequency, hematuria, vaginal discharge and vaginal pain.  Musculoskeletal: Positive for back pain.  All other systems reviewed and are negative.    Allergies  Shellfish allergy  Home Medications   Current Outpatient Rx  Name Route Sig Dispense Refill  . LISINOPRIL 10 MG PO TABS Oral Take 10 mg by mouth daily.      Marland Kitchen PRENATAL VITAMIN PO Oral Take 1 tablet by mouth daily.        BP 157/103  Pulse 81  Temp(Src) 97.4 F (36.3 C) (Oral)  Resp 20  SpO2 99%  Physical Exam  Vitals reviewed. Constitutional: She is oriented to person, place, and time. Vital signs are normal. She appears well-developed and well-nourished.  HENT:  Head: Normocephalic and atraumatic.  Eyes: Conjunctivae are normal. Pupils are equal, round, and reactive to light.  Neck: Normal range of motion. Neck supple.  Cardiovascular: Normal rate, regular rhythm and normal heart sounds.  Exam reveals no friction rub.   No murmur heard. Pulmonary/Chest: Effort normal and breath sounds normal. She has no wheezes. She has no rhonchi. She has no rales. She exhibits no tenderness.  Abdominal: Soft. Bowel sounds are normal. She exhibits no distension and no mass. There is no tenderness.  There is CVA tenderness. There is no rebound and no guarding.       Right sided CVA and right flank tenderness to palpation.  Musculoskeletal: Normal range of motion.  Neurological: She is alert and oriented to person, place, and time. Coordination normal.  Skin: Skin is warm and dry. No rash noted. No erythema. No pallor.    ED Course  Procedures   MDM   Discussed results with patient and family. Patient is requesting tramadol for pain. We'll write prescription for pain. Patient has finished  IV fluids And is ready for discharge.      Thomasene Lot, [place] 09/14/11 1346

## 2011-09-14 NOTE — ED Notes (Signed)
EDPA Cyndie Chime informed of elevated BP, no new orders at this time, ok to dc home

## 2011-09-14 NOTE — ED Notes (Signed)
Patient states she had mild lower back pain a few weeks ago.  She states her pain has increased and she can't sleep.  Patient denies any diff voiding.  She states she feels dehydrated.  She reports n/v.  Patient questions if she had gall stones on last visit

## 2011-10-31 ENCOUNTER — Emergency Department (HOSPITAL_COMMUNITY): Payer: Self-pay

## 2011-10-31 ENCOUNTER — Emergency Department (HOSPITAL_COMMUNITY)
Admission: EM | Admit: 2011-10-31 | Discharge: 2011-10-31 | Disposition: A | Discharge status: Home / Self Care | Payer: Self-pay | Attending: Emergency Medicine | Admitting: Emergency Medicine

## 2011-10-31 ENCOUNTER — Encounter (HOSPITAL_COMMUNITY): Payer: Self-pay | Admitting: *Deleted

## 2011-10-31 DIAGNOSIS — M545 Low back pain, unspecified: Secondary | ICD-10-CM | POA: Insufficient documentation

## 2011-10-31 DIAGNOSIS — S335XXA Sprain of ligaments of lumbar spine, initial encounter: Secondary | ICD-10-CM | POA: Insufficient documentation

## 2011-10-31 DIAGNOSIS — I10 Essential (primary) hypertension: Secondary | ICD-10-CM | POA: Insufficient documentation

## 2011-10-31 DIAGNOSIS — D573 Sickle-cell trait: Secondary | ICD-10-CM | POA: Insufficient documentation

## 2011-10-31 DIAGNOSIS — S39012A Strain of muscle, fascia and tendon of lower back, initial encounter: Secondary | ICD-10-CM

## 2011-10-31 DIAGNOSIS — M546 Pain in thoracic spine: Secondary | ICD-10-CM | POA: Insufficient documentation

## 2011-10-31 DIAGNOSIS — W108XXA Fall (on) (from) other stairs and steps, initial encounter: Secondary | ICD-10-CM | POA: Insufficient documentation

## 2011-10-31 MED ORDER — HYDROCODONE-ACETAMINOPHEN 5-325 MG PO TABS: 1.0000 | ORAL_TABLET | Freq: Four times a day (QID) | ORAL | Status: AC | PRN

## 2011-10-31 MED ORDER — CYCLOBENZAPRINE HCL 5 MG PO TABS: 5.0000 mg | ORAL_TABLET | Freq: Three times a day (TID) | ORAL | Status: AC | PRN

## 2011-10-31 MED ORDER — OXYCODONE-ACETAMINOPHEN 5-325 MG PO TABS
1.0000 | ORAL_TABLET | Freq: Once | ORAL | Status: AC
  Administered 2011-10-31: 1 via ORAL
  Filled 2011-10-31: qty 1

## 2011-10-31 MED ORDER — HYDROMORPHONE HCL PF 2 MG/ML IJ SOLN
2.0000 mg | Freq: Once | INTRAMUSCULAR | Status: AC
Start: 2011-10-31 — End: 2011-10-31
  Administered 2011-10-31: 2 mg via INTRAMUSCULAR
  Filled 2011-10-31: qty 1

## 2011-10-31 NOTE — ED Provider Notes (Signed)
History     CSN: [id number]  Arrival date & time 10/31/11  1430   First MD Initiated Contact with Patient 10/31/11 1644      Chief Complaint  Patient presents with  . Back Pain    HPI Patient states she fell down stairs 2 days after Christmas. Since that time she's been having persistent pain in her back. Patient states she can ambulate without trouble however her back is stiff. She denies any numbness or weakness. The pain is in her mid and lower back. It's sharp and severe. It increases with movement. She has not seen anyone else prior to arrival Past Medical History  Diagnosis Date  . Sickle cell trait   . Hypertension     History reviewed. No pertinent past surgical history.  History reviewed. No pertinent family history.  History  Substance Use Topics  . Smoking status: Current Everyday Smoker  . Smokeless tobacco: Not on file  . Alcohol Use: Yes    OB History    Grav Para Term Preterm Abortions TAB SAB Ect Mult Living                  Review of Systems  All other systems reviewed and are negative.    Allergies  Shellfish allergy  Home Medications   Current Outpatient Rx  Name Route Sig Dispense Refill  . LISINOPRIL 10 MG PO TABS Oral Take 10 mg by mouth daily.      Marland Kitchen PRENATAL VITAMIN PO Oral Take 1 tablet by mouth daily.        BP 155/99  Pulse 89  Temp(Src) 98.2 F (36.8 C) (Oral)  Resp 16  SpO2 99%  Physical Exam  Nursing note and vitals reviewed. Constitutional: She appears well-developed and well-nourished. No distress.  HENT:  Head: Normocephalic and atraumatic.  Right Ear: External ear normal.  Left Ear: External ear normal.  Eyes: Conjunctivae are normal. Right eye exhibits no discharge. Left eye exhibits no discharge. No scleral icterus.  Neck: Neck supple. No tracheal deviation present.  Cardiovascular: Normal rate.   Pulmonary/Chest: Effort normal. No stridor. No respiratory distress.  Musculoskeletal: She exhibits no edema.    Cervical back: She exhibits no tenderness.       Thoracic back: She exhibits tenderness and bony tenderness. She exhibits no swelling and no edema.       Lumbar back: She exhibits tenderness and bony tenderness. She exhibits no swelling and no edema.  Neurological: She is alert. Cranial nerve deficit: no gross deficits.  Skin: Skin is warm and dry. No rash noted.  Psychiatric: She has a normal mood and affect.    ED Course  Procedures (including critical care time)  Labs Reviewed - No data to display Dg Thoracic Spine 2 View  10/31/2011  *RADIOLOGY REPORT*  Clinical Data: Recent fall.  Mid back pain.  THORACIC SPINE - 2 VIEW  Comparison: None.  Findings: Two-view exam shows no fracture.  No subluxation. Intervertebral disc spaces are preserved throughout.  No abnormal paraspinal line on the frontal film.  IMPRESSION: No evidence for thoracic spine fracture.  Original Report Authenticated By: ERIC A. MANSELL, M.D.   Dg Lumbar Spine Complete  10/31/2011  *RADIOLOGY REPORT*  Clinical Data: Mid and low back pain.  LUMBAR SPINE - COMPLETE 4+ VIEW  Comparison: None.  Findings: No evidence for fracture.  No subluxation. Intervertebral disc spaces are preserved throughout.  The facets are well-aligned bilaterally. SI joints are normal.  IMPRESSION: Normal exam.  Original Report Authenticated By: ERIC A. MANSELL, M.D.     1. Lumbar strain       MDM  Patient without signs of fracture on her x-ray. Her symptoms do not suggest any acute neurologic injury. Patient does not have any symptoms to suggest a referred pain from her kidney or abdomen. She will be treated with a course of hydrocodone and Flexeril. She is to follow up with her doctor next week if not improving        Celene Kras, MD 10/31/11 [phone]

## 2011-10-31 NOTE — ED Notes (Signed)
To ed for eval of back pain since falling down stairs on Christmas day. Ambulatory without diff

## 2012-01-15 ENCOUNTER — Emergency Department (HOSPITAL_COMMUNITY)
Admission: EM | Admit: 2012-01-15 | Discharge: 2012-01-15 | Disposition: A | Discharge status: Home / Self Care | Payer: Self-pay | Attending: Emergency Medicine | Admitting: Emergency Medicine

## 2012-01-15 ENCOUNTER — Encounter (HOSPITAL_COMMUNITY): Payer: Self-pay | Admitting: *Deleted

## 2012-01-15 ENCOUNTER — Emergency Department (HOSPITAL_COMMUNITY): Payer: Self-pay

## 2012-01-15 DIAGNOSIS — M25562 Pain in left knee: Secondary | ICD-10-CM

## 2012-01-15 DIAGNOSIS — Z79899 Other long term (current) drug therapy: Secondary | ICD-10-CM | POA: Insufficient documentation

## 2012-01-15 DIAGNOSIS — M25469 Effusion, unspecified knee: Secondary | ICD-10-CM | POA: Insufficient documentation

## 2012-01-15 DIAGNOSIS — I10 Essential (primary) hypertension: Secondary | ICD-10-CM | POA: Insufficient documentation

## 2012-01-15 DIAGNOSIS — M25569 Pain in unspecified knee: Secondary | ICD-10-CM | POA: Insufficient documentation

## 2012-01-15 DIAGNOSIS — J45909 Unspecified asthma, uncomplicated: Secondary | ICD-10-CM | POA: Insufficient documentation

## 2012-01-15 MED ORDER — HYDROCODONE-ACETAMINOPHEN 5-325 MG PO TABS: 1.0000 | ORAL_TABLET | ORAL | Status: AC | PRN

## 2012-01-15 MED ORDER — HYDROCODONE-ACETAMINOPHEN 5-325 MG PO TABS
1.0000 | ORAL_TABLET | Freq: Once | ORAL | Status: AC
  Administered 2012-01-15: 1 via ORAL
  Filled 2012-01-15: qty 1

## 2012-01-15 NOTE — ED Notes (Addendum)
Patient has pain in her left knee.  She thinks she may have fluid on her knee.  Patient states her leg gives out on her and causes her to fall.  She is requesting an inhaler and needs assistance with her bp meds

## 2012-01-15 NOTE — Discharge Instructions (Signed)
Knee Pain The knee is the complex joint between your thigh and your lower leg. It is made up of bones, tendons, ligaments, and cartilage. The bones that make up the knee are:  The femur in the thigh.   The tibia and fibula in the lower leg.   The patella or kneecap riding in the groove on the lower femur.  CAUSES  Knee pain is a common complaint with many causes. A few of these causes are:  Injury, such as:   A ruptured ligament or tendon injury.   Torn cartilage.   Medical conditions, such as:   Gout   Arthritis   Infections   Overuse, over training or overdoing a physical activity.  Knee pain can be minor or severe. Knee pain can accompany debilitating injury. Minor knee problems often respond well to self-care measures or get well on their own. More serious injuries may need medical intervention or even surgery. SYMPTOMS The knee is complex. Symptoms of knee problems can vary widely. Some of the problems are:  Pain with movement and weight bearing.   Swelling and tenderness.   Buckling of the knee.   Inability to straighten or extend your knee.   Your knee locks and you cannot straighten it.   Warmth and redness with pain and fever.   Deformity or dislocation of the kneecap.  DIAGNOSIS  Determining what is wrong may be very straight forward such as when there is an injury. It can also be challenging because of the complexity of the knee. Tests to make a diagnosis may include:  Your caregiver taking a history and doing a physical exam.   Routine X-rays can be used to rule out other problems. X-rays will not reveal a cartilage tear. Some injuries of the knee can be diagnosed by:   Arthroscopy a surgical technique by which a small video camera is inserted through tiny incisions on the sides of the knee. This procedure is used to examine and repair internal knee joint problems. Tiny instruments can be used during arthroscopy to repair the torn knee cartilage  (meniscus).   Arthrography is a radiology technique. A contrast liquid is directly injected into the knee joint. Internal structures of the knee joint then become visible on X-ray film.   An MRI scan is a non x-ray radiology procedure in which magnetic fields and a computer produce two- or three-dimensional images of the inside of the knee. Cartilage tears are often visible using an MRI scanner. MRI scans have largely replaced arthrography in diagnosing cartilage tears of the knee.   Blood work.   Examination of the fluid that helps to lubricate the knee joint (synovial fluid). This is done by taking a sample out using a needle and a syringe.  TREATMENT The treatment of knee problems depends on the cause. Some of these treatments are:  Depending on the injury, proper casting, splinting, surgery or physical therapy care will be needed.   Give yourself adequate recovery time. Do not overuse your joints. If you begin to get sore during workout routines, back off. Slow down or do fewer repetitions.   For repetitive activities such as cycling or running, maintain your strength and nutrition.   Alternate muscle groups. For example if you are a weight lifter, work the upper body on one day and the lower body the next.   Either tight or weak muscles do not give the proper support for your knee. Tight or weak muscles do not absorb the stress placed   on the knee joint. Keep the muscles surrounding the knee strong.   Take care of mechanical problems.   If you have flat feet, orthotics or special shoes may help. See your caregiver if you need help.   Arch supports, sometimes with wedges on the inner or outer aspect of the heel, can help. These can shift pressure away from the side of the knee most bothered by osteoarthritis.   A brace called an "unloader" brace also may be used to help ease the pressure on the most arthritic side of the knee.   If your caregiver has prescribed crutches, braces,  wraps or ice, use as directed. The acronym for this is PRICE. This means protection, rest, ice, compression and elevation.   Nonsteroidal anti-inflammatory drugs (NSAID's), can help relieve pain. But if taken immediately after an injury, they may actually increase swelling. Take NSAID's with food in your stomach. Stop them if you develop stomach problems. Do not take these if you have a history of ulcers, stomach pain or bleeding from the bowel. Do not take without your caregiver's approval if you have problems with fluid retention, heart failure, or kidney problems.   For ongoing knee problems, physical therapy may be helpful.   Glucosamine and chondroitin are over-the-counter dietary supplements. Both may help relieve the pain of osteoarthritis in the knee. These medicines are different from the usual anti-inflammatory drugs. Glucosamine may decrease the rate of cartilage destruction.   Injections of a corticosteroid drug into your knee joint may help reduce the symptoms of an arthritis flare-up. They may provide pain relief that lasts a few months. You may have to wait a few months between injections. The injections do have a small increased risk of infection, water retention and elevated blood sugar levels.   Hyaluronic acid injected into damaged joints may ease pain and provide lubrication. These injections may work by reducing inflammation. A series of shots may give relief for as long as 6 months.   Topical painkillers. Applying certain ointments to your skin may help relieve the pain and stiffness of osteoarthritis. Ask your pharmacist for suggestions. Many over the-counter products are approved for temporary relief of arthritis pain.   In some countries, doctors often prescribe topical NSAID's for relief of chronic conditions such as arthritis and tendinitis. A review of treatment with NSAID creams found that they worked as well as oral medications but without the serious side effects.    PREVENTION  Maintain a healthy weight. Extra pounds put more strain on your joints.   Get strong, stay limber. Weak muscles are a common cause of knee injuries. Stretching is important. Include flexibility exercises in your workouts.   Be smart about exercise. If you have osteoarthritis, chronic knee pain or recurring injuries, you may need to change the way you exercise. This does not mean you have to stop being active. If your knees ache after jogging or playing basketball, consider switching to swimming, water aerobics or other low-impact activities, at least for a few days a week. Sometimes limiting high-impact activities will provide relief.   Make sure your shoes fit well. Choose footwear that is right for your sport.   Protect your knees. Use the proper gear for knee-sensitive activities. Use kneepads when playing volleyball or laying carpet. Buckle your seat belt every time you drive. Most shattered kneecaps occur in car accidents.   Rest when you are tired.  SEEK MEDICAL CARE IF:  You have knee pain that is continual and does not   seem to be getting better.  SEEK IMMEDIATE MEDICAL CARE IF:  Your knee joint feels hot to the touch and you have a high fever. MAKE SURE YOU:   Understand these instructions.   Will watch your condition.   Will get help right away if you are not doing well or get worse.  Document Released: 08/05/2007 Document Revised: 09/27/2011 Document Reviewed: 08/05/2007 [employer] Patient Information 2012 [employer]. 

## 2012-01-15 NOTE — Progress Notes (Signed)
Orthopedic Tech Progress Note Patient Details:  Taylor Vazquez June 06, 1966 [id number]  Other Ortho Devices Type of Ortho Device: Crutches Ortho Device Interventions: Application   [name] 01/15/2012, 1:24 PM

## 2012-01-15 NOTE — ED Provider Notes (Signed)
History     CSN: [id number]  Arrival date & time 01/15/12  1126   First MD Initiated Contact with Patient 01/15/12 1231      Chief Complaint  Patient presents with  . Knee Pain    (Consider location/radiation/quality/duration/timing/severity/associated sxs/prior treatment) HPI Comments: Patient presents with worsening left knee pain over the last month.  Patient notes that she feels like it gives out on her part of the time she's had several falls related to this.  Her last fall was approximately 5 days ago.  The left knee pain is more in the medial aspect.  She has difficulty bearing weight due to her pain.  She notes she's been seeing at least 2 other times and given ibuprofen and tramadol for her pain.  She has not seen an orthopedic physician.  No redness or fevers.  She has had some mild swelling as well.  Patient is a 46 y.o. female presenting with knee pain. The history is provided by the patient. No language interpreter was used.  Knee Pain This is a recurrent problem. The current episode started more than 1 week ago. The problem occurs constantly. The problem has been gradually worsening. Pertinent negatives include no chest pain, no abdominal pain, no headaches and no shortness of breath. The symptoms are aggravated by walking and bending. The symptoms are relieved by nothing. Treatments tried: ibuprofen, tramadol. The treatment provided mild relief.    Past Medical History  Diagnosis Date  . Sickle cell trait   . Hypertension   . Asthma     History reviewed. No pertinent past surgical history.  History reviewed. No pertinent family history.  History  Substance Use Topics  . Smoking status: Current Everyday Smoker  . Smokeless tobacco: Not on file  . Alcohol Use: Yes    OB History    Grav Para Term Preterm Abortions TAB SAB Ect Mult Living                  Review of Systems  Constitutional: Negative.  Negative for fever and chills.  HENT: Negative.   Eyes:  Negative.  Negative for discharge and redness.  Respiratory: Negative.  Negative for cough and shortness of breath.   Cardiovascular: Negative.  Negative for chest pain.  Gastrointestinal: Negative.  Negative for nausea, vomiting, abdominal pain and diarrhea.  Genitourinary: Negative.  Negative for dysuria and vaginal discharge.  Musculoskeletal: Positive for joint swelling and arthralgias. Negative for back pain.  Skin: Negative.  Negative for color change and rash.  Neurological: Negative.  Negative for syncope and headaches.  Hematological: Negative.  Negative for adenopathy.  Psychiatric/Behavioral: Negative.  Negative for confusion.  All other systems reviewed and are negative.    Allergies  Shellfish allergy  Home Medications   Current Outpatient Rx  Name Route Sig Dispense Refill  . ALBUTEROL SULFATE (2.5 MG/3ML) 0.083% IN NEBU Nebulization Take 2.5 mg by nebulization every 6 (six) hours as needed. For shortness of breath    . LISINOPRIL 10 MG PO TABS Oral Take 10 mg by mouth daily.      Marland Kitchen PRENATAL VITAMIN PO Oral Take 1 tablet by mouth daily.        BP 151/108  Pulse 88  Temp(Src) 98.2 F (36.8 C) (Oral)  Resp 20  SpO2 99%  Physical Exam  Nursing note and vitals reviewed. Constitutional: She is oriented to person, place, and time. She appears well-developed and well-nourished.  Non-toxic appearance. She does not have a sickly appearance.  HENT:  Head: Normocephalic and atraumatic.  Eyes: Conjunctivae, EOM and lids are normal. Pupils are equal, round, and reactive to light. No scleral icterus.  Neck: Trachea normal and normal range of motion. Neck supple.  Cardiovascular: Normal rate.   Pulmonary/Chest: Effort normal.  Abdominal: Normal appearance. There is no CVA tenderness.  Musculoskeletal: Normal range of motion. She exhibits tenderness.       Mild tenderness to palpation over medial left knee, mild swelling, no erythema or warmth.  Pt can flex and extend the  knee  Neurological: She is alert and oriented to person, place, and time. She has normal strength.  Skin: Skin is warm, dry and intact. No rash noted.  Psychiatric: She has a normal mood and affect. Her behavior is normal. Judgment and thought content normal.    ED Course  Procedures (including critical care time)  Labs Reviewed - No data to display Dg Knee Complete 4 Views Left  01/15/2012  *RADIOLOGY REPORT*  Clinical Data: Left knee pain, fall.  LEFT KNEE - COMPLETE 4+ VIEW  Comparison: 08/07/2009  Findings: Degenerative changes in the left knee, most pronounced in the medial compartment. No acute bony abnormality.  Specifically, no fracture, subluxation, or dislocation.  Soft tissues are intact. No joint effusion.  IMPRESSION: Degenerative changes. No acute bony abnormality.  Original Report Authenticated By: Cyndie Chime, M.D.     No diagnosis found.    MDM  Patient with mild degenerative changes on her xray.  Patient with no acute signs of septic arthritis at this point in time and her exam.  Patient may have a meniscal tear or other cartilage damage and I will refer her to orthopedics for further evaluation of this.  She understands that she needs to call to make an appointment for this followup will give her a small amount of Norco to help treat her pain at home.        Nat Christen, MD 01/15/12 1245

## 2012-04-11 ENCOUNTER — Encounter (HOSPITAL_COMMUNITY): Payer: Self-pay | Admitting: Emergency Medicine

## 2012-04-11 ENCOUNTER — Emergency Department (HOSPITAL_COMMUNITY)
Admission: EM | Admit: 2012-04-11 | Discharge: 2012-04-11 | Disposition: A | Discharge status: Home / Self Care | Payer: Self-pay | Attending: Emergency Medicine | Admitting: Emergency Medicine

## 2012-04-11 DIAGNOSIS — F3289 Other specified depressive episodes: Secondary | ICD-10-CM | POA: Insufficient documentation

## 2012-04-11 DIAGNOSIS — F329 Major depressive disorder, single episode, unspecified: Secondary | ICD-10-CM | POA: Insufficient documentation

## 2012-04-11 DIAGNOSIS — M1710 Unilateral primary osteoarthritis, unspecified knee: Secondary | ICD-10-CM

## 2012-04-11 DIAGNOSIS — J45909 Unspecified asthma, uncomplicated: Secondary | ICD-10-CM | POA: Insufficient documentation

## 2012-04-11 DIAGNOSIS — I10 Essential (primary) hypertension: Secondary | ICD-10-CM | POA: Insufficient documentation

## 2012-04-11 DIAGNOSIS — F17200 Nicotine dependence, unspecified, uncomplicated: Secondary | ICD-10-CM | POA: Insufficient documentation

## 2012-04-11 HISTORY — DX: Depression, unspecified: F32.A

## 2012-04-11 HISTORY — DX: Major depressive disorder, single episode, unspecified: F32.9

## 2012-04-11 MED ORDER — IBUPROFEN 600 MG PO TABS: 600.0000 mg | ORAL_TABLET | Freq: Three times a day (TID) | ORAL | Status: AC

## 2012-04-11 MED ORDER — HYDROCODONE-ACETAMINOPHEN 5-325 MG PO TABS
ORAL_TABLET | ORAL | Status: DC
Start: 2012-04-11 — End: 2012-04-11

## 2012-04-11 MED ORDER — KETOROLAC TROMETHAMINE 30 MG/ML IJ SOLN
INTRAMUSCULAR | Status: AC
  Filled 2012-04-11: qty 1

## 2012-04-11 MED ORDER — KETOROLAC TROMETHAMINE 15 MG/ML IJ SOLN: 30.0000 mg | Freq: Once | INTRAMUSCULAR | Status: DC

## 2012-04-11 MED ORDER — IBUPROFEN 600 MG PO TABS: 600.0000 mg | ORAL_TABLET | Freq: Three times a day (TID) | ORAL | Status: DC

## 2012-04-11 MED ORDER — HYDROCODONE-ACETAMINOPHEN 5-325 MG PO TABS: ORAL_TABLET | ORAL | Status: AC

## 2012-04-11 MED ORDER — HYDROCODONE-ACETAMINOPHEN 5-325 MG PO TABS
2.0000 | ORAL_TABLET | Freq: Once | ORAL | Status: AC
  Administered 2012-04-11: 2 via ORAL
  Filled 2012-04-11: qty 2

## 2012-04-11 NOTE — Discharge Instructions (Signed)
Wear and Tear Disorders of the Knee (Arthritis, Osteoarthritis) Everyone will experience wear and tear injuries (arthritis, osteoarthritis) of the knee. These are the changes we all get as we age. They come from the joint stress of daily living. The amount of cartilage damage in your knee and your symptoms determine if you need surgery. Mild problems require approximately two months recovery time. More severe problems take several months to recover. With mild problems, your surgeon may find worn and rough cartilage surfaces. With severe changes, your surgeon may find cartilage that has completely worn away and exposed the bone. Loose bodies of bone and cartilage, bone spurs (excess bone growth), and injuries to the menisci (cushions between the large bones of your leg) are also common. All of these problems can cause pain. For a mild wear and tear problem, rough cartilage may simply need to be shaved and smoothed. For more severe problems with areas of exposed bone, your surgeon may use an instrument for roughing up the bone surfaces to stimulate new cartilage growth. Loose bodies are usually removed. Torn menisci may be trimmed or repaired. ABOUT THE ARTHROSCOPIC PROCEDURE Arthroscopy is a surgical technique. It allows your orthopedic surgeon to diagnose and treat your knee injury with accuracy. The surgeon looks into your knee through a small scope. The scope is like a small (pencil-sized) telescope. Arthroscopy is less invasive than open knee surgery. You can expect a more rapid recovery. After the procedure, you will be moved to a recovery area until most of the effects of the medication have worn off. Your caregiver will discuss the test results with you. RECOVERY The severity of the arthritis and the type of procedure performed will determine recovery time. Other important factors include age, physical condition, medical conditions, and the type of rehabilitation program. Strengthening your muscles after  arthroscopy helps guarantee a better recovery. Follow your caregiver's instructions. Use crutches, rest, elevate, ice, and do knee exercises as instructed. Your caregivers will help you and instruct you with exercises and other physical therapy required to regain your mobility, muscle strength, and functioning following surgery. Only take over-the-counter or prescription medicines for pain, discomfort, or fever as directed by your caregiver.  SEEK MEDICAL CARE IF:   There is increased bleeding (more than a small spot) from the wound.   You notice redness, swelling, or increasing pain in the wound.   Pus is coming from wound.   You develop an unexplained oral temperature above 102 F (38.9 C) , or as your caregiver suggests.   You notice a foul smell coming from the wound or dressing.   You have severe pain with motion of the knee.  SEEK IMMEDIATE MEDICAL CARE IF:   You develop a rash.   You have difficulty breathing.   You have any allergic problems.  MAKE SURE YOU:   Understand these instructions.   Will watch your condition.   Will get help right away if you are not doing well or get worse.  Document Released: 10/05/2000 Document Revised: 09/27/2011 Document Reviewed: 03/03/2008 [facility] Patient Information 2012 [place], [place].   Narcotic and benzodiazepine use may cause drowsiness, slowed breathing or dependence.  Please use with caution and do not drive, operate machinery or watch young children alone while taking them.  Taking combinations of these medications or drinking alcohol will potentiate these effects.

## 2012-04-11 NOTE — ED Provider Notes (Signed)
History     CSN: [id number]  Arrival date & time 04/11/12  0846   First MD Initiated Contact with Patient 04/11/12 0919      Chief Complaint  Patient presents with  . Knee Pain    (Consider location/radiation/quality/duration/timing/severity/associated sxs/prior treatment) HPI Comments: Worsening pain to left knee, feels like thigh will give out on her.  She reports has had knee drained in the ED previously.  Has never followed up with an orthopedist in the past.  Denies abd pain, N/V/D, lew swelling, recent travel, pleuritic CP, SOB.  No skin rash, no distal numbness or weakness except limited by pain.  She has difficulty weight bearing recently.    Patient is a 46 y.o. female presenting with knee pain. The history is provided by the patient.  Knee Pain Pertinent negatives include no chest pain and no shortness of breath.    Past Medical History  Diagnosis Date  . Sickle cell trait   . Hypertension   . Asthma   . Depression     History reviewed. No pertinent past surgical history.  History reviewed. No pertinent family history.  History  Substance Use Topics  . Smoking status: Current Everyday Smoker -- 0.5 packs/day    Types: Cigarettes  . Smokeless tobacco: Not on file  . Alcohol Use: Yes    OB History    Grav Para Term Preterm Abortions TAB SAB Ect Mult Living   2               Review of Systems  Constitutional: Negative.   Respiratory: Negative for shortness of breath.   Cardiovascular: Negative for chest pain and leg swelling.  Musculoskeletal: Positive for arthralgias and gait problem.  Skin: Negative for color change, rash and wound.  Neurological: Negative for weakness and numbness.    Allergies  Shellfish allergy  Home Medications   Current Outpatient Rx  Name Route Sig Dispense Refill  . ALBUTEROL SULFATE (2.5 MG/3ML) 0.083% IN NEBU Nebulization Take 2.5 mg by nebulization every 6 (six) hours as needed. As needed for shortness of breath.      . IBUPROFEN 200 MG PO TABS Oral Take 400 mg by mouth every 6 (six) hours as needed. As needed for leg pain.    Marland Kitchen PRENATAL VITAMIN PO Oral Take 1 tablet by mouth daily.      Marland Kitchen HYDROCODONE-ACETAMINOPHEN 5-325 MG PO TABS  1-2 tablets po q 6 hours prn moderate to severe pain 20 tablet 0  . IBUPROFEN 600 MG PO TABS Oral Take 1 tablet (600 mg total) by mouth 3 (three) times daily. 21 tablet 0  . LISINOPRIL 10 MG PO TABS Oral Take 10 mg by mouth daily.        BP 141/77  Pulse 74  Temp 97.9 F (36.6 C) (Oral)  Ht 5\' 3"  (1.6 m)  Wt 194 lb (87.998 kg)  BMI 34.37 kg/m2  SpO2 100%  LMP 02/08/2011  Breastfeeding? Unknown  Physical Exam  Nursing note and vitals reviewed. Constitutional: She appears well-developed and well-nourished. No distress.  Musculoskeletal:       Left knee: She exhibits decreased range of motion. She exhibits no swelling, no effusion, no ecchymosis, no deformity and normal patellar mobility. tenderness found.  Neurological: She is alert. She has normal strength. No sensory deficit.  Skin: Skin is warm and dry. No rash noted. No pallor.  Psychiatric: She has a normal mood and affect. Her speech is normal. Thought content normal.    ED  Course  Procedures (including critical care time)  Labs Reviewed - No data to display No results found.   1. Arthritis of knee       MDM  I reviewed last xray from just March.  Showed degeneration.  Pt with no fever, no sig recent trauma directly to knee.  Pt reports at age 85, she is 9 mo pregnant.  toradol not indicated, norco given and referral made to [facility].  Knee sleeve also given told to elevated, ice at home.          Gavin Pound. Oletta Lamas, MD 04/11/12 2039

## 2012-04-11 NOTE — ED Notes (Signed)
Pt c/o of sob X few days. Pt reports she usually does her breathing treatment at home when she feels like this but is out of the medication. She needs a new prescription for the albuterol.

## 2012-04-11 NOTE — ED Notes (Signed)
Pt c/o of left knee pain X 1 week. Pt reports that she has had some chronic left knee pain in the past but it has never been this bad. The pain has caused her to fall down a few times. Pt reports that the pain is worse at night and when she applies pressure to it.

## 2012-04-11 NOTE — Progress Notes (Signed)
Orthopedic Tech Progress Note Patient Details:  Taylor Vazquez 11/09/1952 [id number]  Ortho Devices Type of Ortho Device: Knee Sleeve Ortho Device/Splint Interventions: Application   Cammer, Mickie Bail 04/11/2012, 10:10 AM

## 2012-04-26 ENCOUNTER — Inpatient Hospital Stay (HOSPITAL_COMMUNITY): Payer: Self-pay

## 2012-04-26 ENCOUNTER — Encounter (HOSPITAL_COMMUNITY): Payer: Self-pay

## 2012-04-26 ENCOUNTER — Inpatient Hospital Stay (HOSPITAL_COMMUNITY)
Admission: AD | Admit: 2012-04-26 | Discharge: 2012-04-26 | Disposition: A | Discharge status: Home / Self Care | Payer: Self-pay | Source: Ambulatory Visit | Attending: Obstetrics & Gynecology | Admitting: Obstetrics & Gynecology

## 2012-04-26 DIAGNOSIS — A599 Trichomoniasis, unspecified: Secondary | ICD-10-CM

## 2012-04-26 DIAGNOSIS — I10 Essential (primary) hypertension: Secondary | ICD-10-CM

## 2012-04-26 DIAGNOSIS — N72 Inflammatory disease of cervix uteri: Secondary | ICD-10-CM | POA: Insufficient documentation

## 2012-04-26 DIAGNOSIS — N938 Other specified abnormal uterine and vaginal bleeding: Secondary | ICD-10-CM | POA: Insufficient documentation

## 2012-04-26 DIAGNOSIS — A5901 Trichomonal vulvovaginitis: Secondary | ICD-10-CM | POA: Insufficient documentation

## 2012-04-26 DIAGNOSIS — N939 Abnormal uterine and vaginal bleeding, unspecified: Secondary | ICD-10-CM

## 2012-04-26 DIAGNOSIS — N949 Unspecified condition associated with female genital organs and menstrual cycle: Secondary | ICD-10-CM | POA: Insufficient documentation

## 2012-04-26 DIAGNOSIS — N730 Acute parametritis and pelvic cellulitis: Secondary | ICD-10-CM

## 2012-04-26 DIAGNOSIS — N739 Female pelvic inflammatory disease, unspecified: Secondary | ICD-10-CM | POA: Insufficient documentation

## 2012-04-26 LAB — URINALYSIS, ROUTINE W REFLEX MICROSCOPIC
Bilirubin Urine: NEGATIVE
Glucose, UA: NEGATIVE mg/dL
Nitrite: NEGATIVE
Specific Gravity, Urine: 1.01 (ref 1.005–1.030)
pH: 7 (ref 5.0–8.0)

## 2012-04-26 LAB — CBC
MCV: 85.7 fL (ref 78.0–100.0)
Platelets: 209 10*3/uL (ref 150–400)
RBC: 4.77 MIL/uL (ref 3.87–5.11)
WBC: 7.3 10*3/uL (ref 4.0–10.5)

## 2012-04-26 LAB — WET PREP, GENITAL: Yeast Wet Prep HPF POC: NONE SEEN

## 2012-04-26 MED ORDER — METRONIDAZOLE 500 MG PO TABS: 500.0000 mg | ORAL_TABLET | Freq: Two times a day (BID) | ORAL | Status: AC

## 2012-04-26 MED ORDER — AZITHROMYCIN 250 MG PO TABS
1000.0000 mg | ORAL_TABLET | Freq: Once | ORAL | Status: AC
  Administered 2012-04-26: 1000 mg via ORAL
  Filled 2012-04-26: qty 4

## 2012-04-26 MED ORDER — CEFTRIAXONE SODIUM 250 MG IJ SOLR
250.0000 mg | Freq: Once | INTRAMUSCULAR | Status: AC
  Administered 2012-04-26: 250 mg via INTRAMUSCULAR
  Filled 2012-04-26: qty 250

## 2012-04-26 MED ORDER — KETOROLAC TROMETHAMINE 60 MG/2ML IM SOLN
60.0000 mg | Freq: Once | INTRAMUSCULAR | Status: AC
  Administered 2012-04-26: 60 mg via INTRAMUSCULAR
  Filled 2012-04-26: qty 2

## 2012-04-26 NOTE — MAU Note (Signed)
Spotting for 2 days first week in April, two days ago started having vaginal bleeding and passed a clot or something has changed pad about 5 times, fatigue drained, off and on sharp pains in bottom of her stomach.

## 2012-04-26 NOTE — MAU Provider Note (Signed)
History     CSN: [id number]  Arrival date & time 04/26/12  1601   None     Chief Complaint  Patient presents with  . Vaginal Bleeding    HPI Taylor Vazquez is a 46 y.o. female who presents to MAU for vaginal bleeding. The bleeding started 3 days ago. She describes the bleeding as heavy and passing clots. She is feeling weak and dizzy. She has pain in the lower abdomen that comes and goes. At it's worst it is 10/10 and then goes away.  Has been taking Advil but then gets nauseated. Last pap smear last year at [facility] and was normal. Had normal periods until April when she just had spotting and then no period in May or June. Current sex partner x 20 years. No history of STI's. The history was provided by the patient.  Past Medical History  Diagnosis Date  . Sickle cell trait   . Hypertension   . Asthma   . Depression     Past Surgical History  Procedure Date  . No past surgeries     Family History  Problem Relation Age of Onset  . Other Neg Hx     History  Substance Use Topics  . Smoking status: Current Everyday Smoker -- 1.0 packs/day    Types: Cigarettes  . Smokeless tobacco: Never Used  . Alcohol Use: Yes    OB History    Grav Para Term Preterm Abortions TAB SAB Ect Mult Living   8 5  5 1 1    4       Review of Systems  Constitutional: Positive for chills. Negative for fever, diaphoresis and fatigue.       Elevated BP, hx of HTN  HENT: Negative for ear pain, congestion, sore throat, facial swelling, neck pain, neck stiffness, dental problem and sinus pressure.   Eyes: Negative for photophobia, pain, discharge and visual disturbance.  Respiratory: Negative for cough, chest tightness and wheezing.   Cardiovascular: Negative for chest pain and palpitations.       Worked up a few weeks ago for chest pain but was released.  Gastrointestinal: Positive for nausea and abdominal pain. Negative for vomiting, diarrhea, constipation and abdominal distention.    Genitourinary: Positive for vaginal bleeding and pelvic pain. Negative for dysuria, frequency, flank pain, vaginal discharge and difficulty urinating.  Musculoskeletal: Positive for back pain. Negative for myalgias and gait problem.  Skin: Negative for color change and rash.  Neurological: Positive for dizziness and light-headedness. Negative for speech difficulty, weakness, numbness and headaches.  Psychiatric/Behavioral: Negative for confusion and agitation. The patient is not nervous/anxious.     Allergies  Shellfish allergy  Home Medications  No current outpatient prescriptions on file.  BP 174/110  Pulse 90  Temp 98.4 F (36.9 C) (Oral)  Resp 18  Ht 5\' 3"  (1.6 m)  Wt 219 lb 9.6 oz (99.61 kg)  BMI 38.90 kg/m2  LMP 01/23/2012  Breastfeeding? No  Physical Exam  Nursing note and vitals reviewed. Constitutional: She is oriented to person, place, and time. She appears well-developed and well-nourished.  HENT:  Head: Normocephalic.  Eyes: EOM are normal.  Neck: Neck supple.  Cardiovascular: Normal rate.   Pulmonary/Chest: Effort normal.  Abdominal: Soft. There is tenderness in the right lower quadrant, suprapubic area and left lower quadrant. There is no rigidity, no rebound, no guarding and no CVA tenderness.  Genitourinary:       External genitalia without lesions, frothy blood tinged discharge  vaginal vault, cervix inflamed, Positive CMT, bilateral adnexal tenderness. Unable to palpate uterus due to patient habitus.  Musculoskeletal: Normal range of motion.  Neurological: She is alert and oriented to person, place, and time. No cranial nerve deficit.  Skin: Skin is warm and dry.  Psychiatric: She has a normal mood and affect. Her behavior is normal. Judgment and thought content normal.   Assessment: Abnormal vaginal bleeding   Abdominal pain  Diff Dx: Uterine fibroids   STI's   Uterine Cancer   Endometrial polyps   Perimenopausal symptoms  Plan:  Ultrasound  pelvis   Cultures for GC, Chlamydia pending   CBC, Wet prep       Results for orders placed during the hospital encounter of 04/26/12 (from the past 24 hour(s))  URINALYSIS, ROUTINE W REFLEX MICROSCOPIC     Status: Abnormal   Collection Time   04/26/12  4:19 PM      Component Value Range   Color, Urine YELLOW  YELLOW   APPearance CLEAR  CLEAR   Specific Gravity, Urine 1.010  1.005 - 1.030   pH 7.0  5.0 - 8.0   Glucose, UA NEGATIVE  NEGATIVE mg/dL   Hgb urine dipstick MODERATE (*) NEGATIVE   Bilirubin Urine NEGATIVE  NEGATIVE   Ketones, ur NEGATIVE  NEGATIVE mg/dL   Protein, ur NEGATIVE  NEGATIVE mg/dL   Urobilinogen, UA 0.2  0.0 - 1.0 mg/dL   Nitrite NEGATIVE  NEGATIVE   Leukocytes, UA NEGATIVE  NEGATIVE  URINE MICROSCOPIC-ADD ON     Status: Normal   Collection Time   04/26/12  4:19 PM      Component Value Range   Squamous Epithelial / LPF RARE  RARE   WBC, UA 0-2  <3 WBC/hpf   RBC / HPF 3-6  <3 RBC/hpf  POCT PREGNANCY, URINE     Status: Normal   Collection Time   04/26/12  4:23 PM      Component Value Range   Preg Test, Ur NEGATIVE  NEGATIVE  CBC     Status: Normal   Collection Time   04/26/12  4:38 PM      Component Value Range   WBC 7.3  4.0 - 10.5 K/uL   RBC 4.77  3.87 - 5.11 MIL/uL   Hemoglobin 13.6  12.0 - 15.0 g/dL   HCT 29.5  62.1 - 30.8 %   MCV 85.7  78.0 - 100.0 fL   MCH 28.5  26.0 - 34.0 pg   MCHC 33.3  30.0 - 36.0 g/dL   RDW 65.7  84.6 - 96.2 %   Platelets 209  150 - 400 K/uL  WET PREP, GENITAL     Status: Abnormal   Collection Time   04/26/12  4:50 PM      Component Value Range   Yeast Wet Prep HPF POC NONE SEEN  NONE SEEN   Trich, Wet Prep RARE (*) NONE SEEN   Clue Cells Wet Prep HPF POC NONE SEEN  NONE SEEN   WBC, Wet Prep HPF POC FEW (*) NONE SEEN   US Transvaginal Non-ob  04/26/2012  *RADIOLOGY REPORT*  Clinical Data: Pain and bleeding  TRANSABDOMINAL AND TRANSVAGINAL ULTRASOUND OF PELVIS Technique:  Both transabdominal and transvaginal ultrasound  examinations of the pelvis were performed. Transabdominal technique was performed for global imaging of the pelvis including uterus, ovaries, adnexal regions, and pelvic cul-de-sac.  It was necessary to proceed with endovaginal exam following the transabdominal exam to visualize the endometrium and ovaries.  Comparison:  Ultrasound 03/15/2009  Findings:  Uterus: Normal in size at 8.3 x 4.3 x 5.3 cm.  There is small serosal fundal leiomyoma measuring 2.3 cm.  Endometrium: Endometrium measures 5.6 mm which is greater than the upper limits of normal of 5 mm for post menopausal female with bleeding.  There is no nodularity of the endometrium.  Right ovary:  Normal at 3.8 x 2.9 x 3.0 cm  Left ovary: Normal at 2.1 x 1.1 x 1.2 cm.  Other findings: No free fluid  IMPRESSION:  The endometrium is greater than normal thickness for a post menopausal female.  In patient with vaginal bleeding consider endometrial biopsy.  Original Report Authenticated By: Genevive Bi, M.D.   US Pelvis Complete  04/26/2012  *RADIOLOGY REPORT*  Clinical Data: Pain and bleeding  TRANSABDOMINAL AND TRANSVAGINAL ULTRASOUND OF PELVIS Technique:  Both transabdominal and transvaginal ultrasound examinations of the pelvis were performed. Transabdominal technique was performed for global imaging of the pelvis including uterus, ovaries, adnexal regions, and pelvic cul-de-sac.  It was necessary to proceed with endovaginal exam following the transabdominal exam to visualize the endometrium and ovaries.  Comparison:  Ultrasound 03/15/2009  Findings:  Uterus: Normal in size at 8.3 x 4.3 x 5.3 cm.  There is small serosal fundal leiomyoma measuring 2.3 cm.  Endometrium: Endometrium measures 5.6 mm which is greater than the upper limits of normal of 5 mm for post menopausal female with bleeding.  There is no nodularity of the endometrium.  Right ovary:  Normal at 3.8 x 2.9 x 3.0 cm  Left ovary: Normal at 2.1 x 1.1 x 1.2 cm.  Other findings: No free fluid   IMPRESSION:  The endometrium is greater than normal thickness for a post menopausal female.  In patient with vaginal bleeding consider endometrial biopsy.  Original Report Authenticated By: Genevive Bi, M.D.   Re evaluation of patient no pain at this time.  Will treat for Cervicitis, trichomonas and PID  Plan:  Rocephin 250 mg IM   Zithromax 1 gram po   Rx Flagyl    Follow up in GYN Clinic for endometrial biopsy  I have discussed with the patient in detail results of the lab results, ultrasound and need for follow up. Patient voices understanding.  I have sent a message to the GYN Clinic to call patient for follow up.  ED Course  Procedures   MDM

## 2012-04-28 DIAGNOSIS — I10 Essential (primary) hypertension: Secondary | ICD-10-CM

## 2012-04-28 LAB — GC/CHLAMYDIA PROBE AMP, GENITAL: GC Probe Amp, Genital: NEGATIVE

## 2012-04-28 NOTE — MAU Provider Note (Signed)
HTN needs to be addressed.  Pt will be called by Loyce Dys with office numbers.  Pt will follow up in gyn clinic for endometrial biopsy.

## 2012-04-29 ENCOUNTER — Telehealth (HOSPITAL_COMMUNITY): Payer: Self-pay | Admitting: *Deleted

## 2012-04-29 NOTE — Telephone Encounter (Signed)
Message copied by Pennie Banter on Tue Apr 29, 2012  9:27 AM ------      Message from: Lesly Dukes      Created: Mon Apr 28, 2012  3:27 PM       Rikki Spearing,            This patient had HTN im the MAU and was not addressed.  Can you call her with office numbers for primary care MDs.  In particular [facility] and [facility].

## 2012-04-29 NOTE — Telephone Encounter (Addendum)
Telephone call to patient regarding her HTN.  Instructed patient that Dr. Penne Lash wants her to follow up with a primary care physician to address her HTN.  Patient states she already has a follow up appointment with [facility].

## 2012-05-06 ENCOUNTER — Ambulatory Visit
Admission: RE | Admit: 2012-05-06 | Discharge: 2012-05-06 | Disposition: A | Discharge status: Home / Self Care | Payer: Self-pay | Source: Ambulatory Visit | Attending: Family Medicine | Admitting: Family Medicine

## 2012-05-06 ENCOUNTER — Other Ambulatory Visit: Payer: Self-pay | Admitting: Family Medicine

## 2012-05-06 DIAGNOSIS — N63 Unspecified lump in unspecified breast: Secondary | ICD-10-CM

## 2012-05-07 ENCOUNTER — Other Ambulatory Visit: Payer: Self-pay

## 2012-05-13 ENCOUNTER — Ambulatory Visit
Admission: RE | Admit: 2012-05-13 | Discharge: 2012-05-13 | Disposition: A | Discharge status: Home / Self Care | Payer: Self-pay | Source: Ambulatory Visit | Attending: Family Medicine | Admitting: Family Medicine

## 2012-05-13 DIAGNOSIS — N63 Unspecified lump in unspecified breast: Secondary | ICD-10-CM

## 2012-05-16 ENCOUNTER — Other Ambulatory Visit: Payer: Self-pay | Admitting: Obstetrics & Gynecology

## 2012-06-11 ENCOUNTER — Other Ambulatory Visit: Payer: Self-pay | Admitting: Obstetrics & Gynecology

## 2012-06-19 ENCOUNTER — Other Ambulatory Visit: Payer: Self-pay

## 2012-06-19 ENCOUNTER — Emergency Department (HOSPITAL_COMMUNITY)
Admission: EM | Admit: 2012-06-19 | Discharge: 2012-06-19 | Disposition: A | Discharge status: Home / Self Care | Payer: Self-pay | Attending: Emergency Medicine | Admitting: Emergency Medicine

## 2012-06-19 ENCOUNTER — Encounter (HOSPITAL_COMMUNITY): Payer: Self-pay | Admitting: *Deleted

## 2012-06-19 ENCOUNTER — Emergency Department (HOSPITAL_COMMUNITY): Payer: Self-pay

## 2012-06-19 DIAGNOSIS — I10 Essential (primary) hypertension: Secondary | ICD-10-CM | POA: Insufficient documentation

## 2012-06-19 DIAGNOSIS — R0789 Other chest pain: Secondary | ICD-10-CM

## 2012-06-19 DIAGNOSIS — J45901 Unspecified asthma with (acute) exacerbation: Secondary | ICD-10-CM | POA: Insufficient documentation

## 2012-06-19 DIAGNOSIS — F3289 Other specified depressive episodes: Secondary | ICD-10-CM | POA: Insufficient documentation

## 2012-06-19 DIAGNOSIS — F329 Major depressive disorder, single episode, unspecified: Secondary | ICD-10-CM | POA: Insufficient documentation

## 2012-06-19 DIAGNOSIS — R071 Chest pain on breathing: Secondary | ICD-10-CM | POA: Insufficient documentation

## 2012-06-19 DIAGNOSIS — Z79899 Other long term (current) drug therapy: Secondary | ICD-10-CM | POA: Insufficient documentation

## 2012-06-19 LAB — CBC WITH DIFFERENTIAL/PLATELET
Eosinophils Absolute: 0.3 10*3/uL (ref 0.0–0.7)
Eosinophils Relative: 4 % (ref 0–5)
HCT: 41.5 % (ref 36.0–46.0)
Lymphs Abs: 3 10*3/uL (ref 0.7–4.0)
MCH: 28.5 pg (ref 26.0–34.0)
MCV: 84.5 fL (ref 78.0–100.0)
Monocytes Absolute: 0.8 10*3/uL (ref 0.1–1.0)
Platelets: 225 10*3/uL (ref 150–400)
RBC: 4.91 MIL/uL (ref 3.87–5.11)
RDW: 14.2 % (ref 11.5–15.5)

## 2012-06-19 LAB — BASIC METABOLIC PANEL
CO2: 23 mEq/L (ref 19–32)
Calcium: 9 mg/dL (ref 8.4–10.5)
Chloride: 107 mEq/L (ref 96–112)
Creatinine, Ser: 0.97 mg/dL (ref 0.50–1.10)
Glucose, Bld: 114 mg/dL — ABNORMAL HIGH (ref 70–99)

## 2012-06-19 LAB — POCT I-STAT TROPONIN I: Troponin i, poc: 0.01 ng/mL (ref 0.00–0.08)

## 2012-06-19 MED ORDER — PREDNISONE 20 MG PO TABS: ORAL_TABLET | ORAL | Status: AC

## 2012-06-19 MED ORDER — ALBUTEROL SULFATE HFA 108 (90 BASE) MCG/ACT IN AERS: 2.0000 | INHALATION_SPRAY | RESPIRATORY_TRACT | Status: DC | PRN

## 2012-06-19 MED ORDER — OXYCODONE-ACETAMINOPHEN 5-325 MG PO TABS: 2.0000 | ORAL_TABLET | Freq: Four times a day (QID) | ORAL | Status: AC | PRN

## 2012-06-19 NOTE — ED Notes (Signed)
MD at bedside. 

## 2012-06-19 NOTE — ED Notes (Signed)
Pt reports midsternal chest pain radiating to bilateral arms, constant, x 3-4 days. Pt reports nausea and dizziness. Pt report has taken tums without any relief.

## 2012-06-19 NOTE — ED Notes (Signed)
Discharged home with written and verbal instructions.  No questions or concerns at discharge. 

## 2012-06-19 NOTE — ED Notes (Signed)
Ambulatory to bathroom- Snack provided to husband.

## 2012-06-19 NOTE — ED Provider Notes (Signed)
History     CSN: [id number]  Arrival date & time 06/19/12  1625   First MD Initiated Contact with Patient 06/19/12 1921      Chief Complaint  Patient presents with  . Chest Pain    (Consider location/radiation/quality/duration/timing/severity/associated sxs/prior treatment) HPI 3-4 days of constant 24-hour a day cough, wheezing, occasional shortness of breath, constant chest pain which is anterior sharp stabbing radiating towards the right shoulder with occasional sweats and nausea from the pain. The pain however again is nonexertional it is positional it is nonpleuritic. She ran out of her inhaler. There is no treatment prior to arrival. Her symptoms are moderately severe pain. She has mild shortness of breath. Past Medical History  Diagnosis Date  . Sickle cell trait   . Hypertension   . Asthma   . Depression     Past Surgical History  Procedure Date  . No past surgeries     Family History  Problem Relation Age of Onset  . Other Neg Hx     History  Substance Use Topics  . Smoking status: Current Everyday Smoker -- 1.0 packs/day    Types: Cigarettes  . Smokeless tobacco: Never Used  . Alcohol Use: Yes    OB History    Grav Para Term Preterm Abortions TAB SAB Ect Mult Living   8 5  5 1 1    4       Review of Systems 10 Systems reviewed and are negative for acute change except as noted in the HPI. Allergies  Shellfish allergy  Home Medications   Current Outpatient Rx  Name Route Sig Dispense Refill  . ACETAMINOPHEN 500 MG PO TABS Oral Take 500 mg by mouth every 6 (six) hours as needed. For pain    . ALBUTEROL SULFATE (2.5 MG/3ML) 0.083% IN NEBU Nebulization Take 2.5 mg by nebulization every 6 (six) hours as needed. As needed for shortness of breath.    Marland Kitchen GABAPENTIN 100 MG PO CAPS Oral Take 100 mg by mouth 2 (two) times daily.    Marland Kitchen LISINOPRIL 10 MG PO TABS Oral Take 10 mg by mouth daily.      Marland Kitchen PRENATAL VITAMIN PO Oral Take 1 tablet by mouth daily.      .  ALBUTEROL SULFATE HFA 108 (90 BASE) MCG/ACT IN AERS Inhalation Inhale 2 puffs into the lungs every 2 (two) hours as needed for wheezing or shortness of breath (cough). 1 Inhaler 0  . OXYCODONE-ACETAMINOPHEN 5-325 MG PO TABS Oral Take 2 tablets by mouth every 6 (six) hours as needed for pain. 20 tablet 0  . PREDNISONE 20 MG PO TABS  3 tabs po day one, then 2 po daily x 4 days 11 tablet 0    BP 142/82  Pulse 86  Temp 98.2 F (36.8 C) (Oral)  Resp 22  SpO2 97%  LMP 06/09/2012  Physical Exam  Nursing note and vitals reviewed. Constitutional:       Awake, alert, nontoxic appearance.  HENT:  Head: Atraumatic.  Eyes: Right eye exhibits no discharge. Left eye exhibits no discharge.  Neck: Neck supple.  Cardiovascular: Normal rate and regular rhythm.   No murmur heard. Pulmonary/Chest: Effort normal. No respiratory distress. She has wheezes. She has no rales. She exhibits tenderness.       Mild diffuse scattered end expiratory wheezes without retractions or accessory muscle usage. She has normal speech with normal room air pulse oximetry.  Her chest wall shows exactly reproducible anterior chest wall tenderness with  no rash noted and no deformity noted.  Abdominal: Soft. There is no tenderness. There is no rebound.  Musculoskeletal: She exhibits no edema and no tenderness.       Baseline ROM, no obvious new focal weakness.  Neurological: She is alert.       Mental status and motor strength appears baseline for patient and situation.  Skin: No rash noted.  Psychiatric: She has a normal mood and affect.    ED Course  Procedures (including critical care time) ECG: Normal sinus rhythm, ventricular rate 79, normal axis, normal intervals, septal Q waves, no acute ischemic changes noted, no comparison ECG available  Labs Reviewed  BASIC METABOLIC PANEL - Abnormal; Notable for the following:    Potassium 3.3 (*)     Glucose, Bld 114 (*)     GFR calc non Af Amer 64 (*)     GFR calc Af Amer 74  (*)     All other components within normal limits  CBC WITH DIFFERENTIAL  POCT I-STAT TROPONIN I  LAB REPORT - SCANNED   No results found.   1. Asthma attack   2. Chest wall pain       MDM  Pt stable in ED with no significant deterioration in condition.Patient / Family / Caregiver informed of clinical course, understand medical decision-making process, and agree with plan.        Hurman Horn, MD 06/21/12 [id number]

## 2012-07-07 ENCOUNTER — Other Ambulatory Visit: Payer: Self-pay | Admitting: Obstetrics & Gynecology

## 2012-07-30 ENCOUNTER — Other Ambulatory Visit: Payer: Self-pay | Admitting: Obstetrics & Gynecology

## 2012-12-14 ENCOUNTER — Encounter (HOSPITAL_COMMUNITY): Payer: Self-pay | Admitting: Physical Medicine and Rehabilitation

## 2012-12-14 ENCOUNTER — Emergency Department (HOSPITAL_COMMUNITY)
Admission: EM | Admit: 2012-12-14 | Discharge: 2012-12-14 | Disposition: A | Discharge status: Home / Self Care | Payer: Self-pay | Attending: Emergency Medicine | Admitting: Emergency Medicine

## 2012-12-14 DIAGNOSIS — Z862 Personal history of diseases of the blood and blood-forming organs and certain disorders involving the immune mechanism: Secondary | ICD-10-CM | POA: Insufficient documentation

## 2012-12-14 DIAGNOSIS — J45909 Unspecified asthma, uncomplicated: Secondary | ICD-10-CM | POA: Insufficient documentation

## 2012-12-14 DIAGNOSIS — Z79899 Other long term (current) drug therapy: Secondary | ICD-10-CM | POA: Insufficient documentation

## 2012-12-14 DIAGNOSIS — K089 Disorder of teeth and supporting structures, unspecified: Secondary | ICD-10-CM | POA: Insufficient documentation

## 2012-12-14 DIAGNOSIS — L509 Urticaria, unspecified: Secondary | ICD-10-CM

## 2012-12-14 DIAGNOSIS — L299 Pruritus, unspecified: Secondary | ICD-10-CM | POA: Insufficient documentation

## 2012-12-14 DIAGNOSIS — R509 Fever, unspecified: Secondary | ICD-10-CM | POA: Insufficient documentation

## 2012-12-14 DIAGNOSIS — I10 Essential (primary) hypertension: Secondary | ICD-10-CM | POA: Insufficient documentation

## 2012-12-14 DIAGNOSIS — F17200 Nicotine dependence, unspecified, uncomplicated: Secondary | ICD-10-CM | POA: Insufficient documentation

## 2012-12-14 DIAGNOSIS — R21 Rash and other nonspecific skin eruption: Secondary | ICD-10-CM | POA: Insufficient documentation

## 2012-12-14 DIAGNOSIS — Z8659 Personal history of other mental and behavioral disorders: Secondary | ICD-10-CM | POA: Insufficient documentation

## 2012-12-14 DIAGNOSIS — K0889 Other specified disorders of teeth and supporting structures: Secondary | ICD-10-CM

## 2012-12-14 MED ORDER — OXYCODONE-ACETAMINOPHEN 5-325 MG PO TABS
2.0000 | ORAL_TABLET | Freq: Once | ORAL | Status: AC
  Administered 2012-12-14: 2 via ORAL
  Filled 2012-12-14: qty 2

## 2012-12-14 MED ORDER — DEXAMETHASONE SODIUM PHOSPHATE 10 MG/ML IJ SOLN
10.0000 mg | Freq: Once | INTRAMUSCULAR | Status: AC
  Administered 2012-12-14: 10 mg via INTRAMUSCULAR
  Filled 2012-12-14: qty 1

## 2012-12-14 MED ORDER — OXYCODONE-ACETAMINOPHEN 10-325 MG PO TABS: 1.0000 | ORAL_TABLET | ORAL | Status: DC | PRN

## 2012-12-14 MED ORDER — PENICILLIN V POTASSIUM 500 MG PO TABS: 500.0000 mg | ORAL_TABLET | Freq: Four times a day (QID) | ORAL | Status: DC

## 2012-12-14 NOTE — ED Provider Notes (Signed)
History  This chart was scribed for non-physician practitioner working with Carleene Cooper III, MD, by Candelaria Stagers, ED Scribe. This patient was seen in room TR09C/TR09C and the patient's care was started at 5:10 PM   CSN: [id number]  Arrival date & time 12/14/12  1429   None     Chief Complaint  Patient presents with  . Dental Pain  . Urticaria     The history is provided by the patient. No language interpreter was used.   Taylor Vazquez is a 47 y.o. female who presents to the Emergency Department complaining of right upper molar pain that started several days ago.  Pt reports that the same tooth was bothering her about one year ago and she took percocet with relief.  She denies swelling, difficulty breathing, or difficulty eating.  She reports subjective fever and drainage from the tooth that has now subsided.  Her fever in the ED is 98.  Pt is also complaining of itching hives to her bilateral legs that started about one week ago.  She experienced the same rash about one month ago.  She denies trouble breathing, throat swelling, or itchiness to tongue or lips.  Pt has used benadryl for the rash with no relief.  Nothing seems to make the sx better or worse.  Pt has h/o sickle cell trait, HTN, asthma, and depression.      Past Medical History  Diagnosis Date  . Sickle cell trait   . Hypertension   . Asthma   . Depression     Past Surgical History  Procedure Laterality Date  . No past surgeries      Family History  Problem Relation Age of Onset  . Other Neg Hx     History  Substance Use Topics  . Smoking status: Current Every Day Smoker -- 1.00 packs/day    Types: Cigarettes  . Smokeless tobacco: Never Used  . Alcohol Use: Yes    OB History   Grav Para Term Preterm Abortions TAB SAB Ect Mult Living   8 5  5 1 1    4       Review of Systems  HENT: Positive for dental problem. Negative for trouble swallowing.   Skin: Positive for rash (to bilateral upper  legs).  All other systems reviewed and are negative.    Allergies  Shellfish allergy  Home Medications   Current Outpatient Rx  Name  Route  Sig  Dispense  Refill  . albuterol (PROVENTIL HFA;VENTOLIN HFA) 108 (90 BASE) MCG/ACT inhaler   Inhalation   Inhale 2 puffs into the lungs every 2 (two) hours as needed for wheezing or shortness of breath (cough).   1 Inhaler   0   . albuterol (PROVENTIL) (2.5 MG/3ML) 0.083% nebulizer solution   Nebulization   Take 2.5 mg by nebulization every 6 (six) hours as needed. As needed for shortness of breath.         Marland Kitchen ibuprofen (ADVIL,MOTRIN) 200 MG tablet   Oral   Take 400-600 mg by mouth 2 (two) times daily as needed for pain. For pain         . lisinopril (PRINIVIL,ZESTRIL) 10 MG tablet   Oral   Take 10 mg by mouth daily.             BP 130/86  Pulse 89  Temp(Src) 98 F (36.7 C) (Oral)  Resp 18  SpO2 97%  Physical Exam  Nursing note and vitals reviewed. Constitutional: She is oriented  to person, place, and time. She appears well-developed and well-nourished. No distress.  HENT:  Head: Normocephalic and atraumatic.  Multiple dental caries.  Gingival erythema to upper left posterior molar.  No fluctuant, induration, or discharge.  Uvula midline.  No pharyngeal erythema or edema.   Airway patent.  No trismus.    Eyes: EOM are normal.  Neck: Neck supple. No tracheal deviation present.  Cardiovascular: Normal rate.   Pulmonary/Chest: Effort normal and breath sounds normal. No respiratory distress. She has no wheezes.  Musculoskeletal: Normal range of motion.  Neurological: She is alert and oriented to person, place, and time.  Skin: Skin is warm and dry. She is not diaphoretic.  Multiple singular wheels and confluent wheels c/o allergic reaction on anterior thighs bilaterally.  Mild excoriation.  No signs of infection.  Psychiatric: She has a normal mood and affect. Her behavior is normal.    ED Course   Procedures  DIAGNOSTIC STUDIES: Oxygen Saturation is 97% on room air, normal by my interpretation.    COORDINATION OF CARE:  5:13 PM Discussed course of care with pt at bedside that includes pain medication and decadron shot.  Advised pt to follow up with a dentist.  Pt understands and agrees.      Labs Reviewed - No data to display No results found.   1. Pain, dental   2. Urticaria       MDM  Urticaria of unknown etiology.  We'll treat here with shot of Decadron.  Patient has Benadryl at home which he may continue to take.Patient with toothache.  No gross abscess.  Exam unconcerning for Ludwig's angina or spread of infection.  Will treat with penicillin and pain medicine.  Urged patient to follow-up with dentist.     I personally performed the services described in this documentation, which was scribed in my presence. The recorded information has been reviewed and is accurate.        Arthor Captain, PA-C 12/15/12 0006

## 2012-12-14 NOTE — ED Notes (Signed)
Pt instructed to change into hospital gown.

## 2012-12-14 NOTE — ED Notes (Signed)
Pt presents to department for evaluation of R upper molar dental pain, also states hives to bilateral legs for several days. Respirations unlabored. Pt is alert and oriented x4. No signs of distress noted.

## 2012-12-15 NOTE — ED Provider Notes (Signed)
Medical screening examination/treatment/procedure(s) were performed by non-physician practitioner and as supervising physician I was immediately available for consultation/collaboration.   Landis Dowdy III, MD 12/15/12 0118 

## 2013-03-19 ENCOUNTER — Encounter (HOSPITAL_COMMUNITY): Payer: Self-pay | Admitting: *Deleted

## 2013-03-19 ENCOUNTER — Emergency Department (HOSPITAL_COMMUNITY)
Admission: EM | Admit: 2013-03-19 | Discharge: 2013-03-19 | Discharge status: Against Medical Advice | Payer: Self-pay | Attending: Emergency Medicine | Admitting: Emergency Medicine

## 2013-03-19 DIAGNOSIS — I10 Essential (primary) hypertension: Secondary | ICD-10-CM | POA: Insufficient documentation

## 2013-03-19 DIAGNOSIS — J45909 Unspecified asthma, uncomplicated: Secondary | ICD-10-CM | POA: Insufficient documentation

## 2013-03-19 DIAGNOSIS — Z760 Encounter for issue of repeat prescription: Secondary | ICD-10-CM | POA: Insufficient documentation

## 2013-03-19 DIAGNOSIS — M79609 Pain in unspecified limb: Secondary | ICD-10-CM | POA: Insufficient documentation

## 2013-03-19 DIAGNOSIS — F17200 Nicotine dependence, unspecified, uncomplicated: Secondary | ICD-10-CM | POA: Insufficient documentation

## 2013-03-19 NOTE — ED Notes (Signed)
Pt reports being here a couple of weeks ago for leg pain.  Reports that she waited too long to go get rx for percocet filled that when she got there she realized the rx wasn't signed.  Pt reports no change in pain.  Pt also here for HTN meds refilled-states that she has been out for 2 weeks.

## 2013-03-19 NOTE — ED Notes (Addendum)
Patient is very upset about the waiting time.  Apologized for the wait but patient insisted on going home and coming back.  Told to patietn to come back if symtpoms worsen.  Patient walked away and insisted that she was going home and will be back tomorrow.

## 2013-04-02 ENCOUNTER — Telehealth (HOSPITAL_COMMUNITY): Payer: Self-pay | Admitting: Emergency Medicine

## 2013-04-02 ENCOUNTER — Emergency Department (HOSPITAL_COMMUNITY)
Admission: EM | Admit: 2013-04-02 | Discharge: 2013-04-02 | Disposition: A | Discharge status: Home / Self Care | Payer: Self-pay | Attending: Emergency Medicine | Admitting: Emergency Medicine

## 2013-04-02 DIAGNOSIS — F17200 Nicotine dependence, unspecified, uncomplicated: Secondary | ICD-10-CM | POA: Insufficient documentation

## 2013-04-02 DIAGNOSIS — Z79899 Other long term (current) drug therapy: Secondary | ICD-10-CM | POA: Insufficient documentation

## 2013-04-02 DIAGNOSIS — K0889 Other specified disorders of teeth and supporting structures: Secondary | ICD-10-CM

## 2013-04-02 DIAGNOSIS — Z862 Personal history of diseases of the blood and blood-forming organs and certain disorders involving the immune mechanism: Secondary | ICD-10-CM | POA: Insufficient documentation

## 2013-04-02 DIAGNOSIS — I10 Essential (primary) hypertension: Secondary | ICD-10-CM | POA: Insufficient documentation

## 2013-04-02 DIAGNOSIS — K089 Disorder of teeth and supporting structures, unspecified: Secondary | ICD-10-CM | POA: Insufficient documentation

## 2013-04-02 DIAGNOSIS — J45909 Unspecified asthma, uncomplicated: Secondary | ICD-10-CM | POA: Insufficient documentation

## 2013-04-02 DIAGNOSIS — Z8659 Personal history of other mental and behavioral disorders: Secondary | ICD-10-CM | POA: Insufficient documentation

## 2013-04-02 DIAGNOSIS — K029 Dental caries, unspecified: Secondary | ICD-10-CM | POA: Insufficient documentation

## 2013-04-02 MED ORDER — OXYCODONE-ACETAMINOPHEN 5-325 MG PO TABS: 1.0000 | ORAL_TABLET | Freq: Three times a day (TID) | ORAL | Status: DC | PRN

## 2013-04-02 MED ORDER — PENICILLIN V POTASSIUM 500 MG PO TABS: 500.0000 mg | ORAL_TABLET | Freq: Four times a day (QID) | ORAL | Status: DC

## 2013-04-02 MED ORDER — OXYCODONE-ACETAMINOPHEN 5-325 MG PO TABS
2.0000 | ORAL_TABLET | Freq: Once | ORAL | Status: AC
  Administered 2013-04-02: 2 via ORAL
  Filled 2013-04-02: qty 2

## 2013-04-02 NOTE — ED Notes (Signed)
PT was seen here a few weeks ago; px given for percocet and antibiotics; pt reports MD did not sign off on px for narcotics and pharmacy says she needs another copy signed. Pharmacy did not give it back to her.

## 2013-04-02 NOTE — ED Provider Notes (Signed)
History     CSN: [id number]  Arrival date & time 04/02/13  1050   First MD Initiated Contact with Patient 04/02/13 1110      Chief Complaint  Patient presents with  . Dental Pain    (Consider location/radiation/quality/duration/timing/severity/associated sxs/prior treatment) HPI  Patient presents to the emergency department with a dental complaint. Symptoms began a few weeks ago and have ben waxing and waining. The patient has tried to alleviate pain with penicillin.  Pain rated at a 10/10, characterized as throbbing in nature and located left upper molar. She was prescribed Percocet but the provider didn't sign it and couldn't get it filled, she did not call the dentist because she cant afford it. Has been taking Penicillin once a day to stretch out" the medication. Patient denies fever, night sweats, chills, difficulty swallowing or opening mouth, SOB, nuchal rigidity or decreased ROM of neck.  Patient does not have a dentist and requests a resource guide at discharge.   Past Medical History  Diagnosis Date  . Sickle cell trait   . Hypertension   . Asthma   . Depression     Past Surgical History  Procedure Laterality Date  . No past surgeries      Family History  Problem Relation Age of Onset  . Other Neg Hx     History  Substance Use Topics  . Smoking status: Current Every Day Smoker -- 1.00 packs/day    Types: Cigarettes  . Smokeless tobacco: Never Used  . Alcohol Use: Yes    OB History   Grav Para Term Preterm Abortions TAB SAB Ect Mult Living   8 5  5 1 1    4       Review of Systems  HENT: Positive for dental problem.   All other systems reviewed and are negative.    Allergies  Shellfish allergy  Home Medications   Current Outpatient Rx  Name  Route  Sig  Dispense  Refill  . acetaminophen (TYLENOL) 500 MG tablet   Oral   Take 1,000 mg by mouth 3 (three) times daily as needed for pain.         Marland Kitchen albuterol (PROVENTIL HFA;VENTOLIN HFA) 108 (90  BASE) MCG/ACT inhaler   Inhalation   Inhale 2 puffs into the lungs every 4 (four) hours as needed for wheezing or shortness of breath.         Marland Kitchen albuterol (PROVENTIL) (2.5 MG/3ML) 0.083% nebulizer solution   Nebulization   Take 2.5 mg by nebulization every 4 (four) hours as needed for wheezing or shortness of breath.          Marland Kitchen ibuprofen (ADVIL,MOTRIN) 200 MG tablet   Oral   Take 400-600 mg by mouth 3 (three) times daily as needed for pain. For pain         . lisinopril (PRINIVIL,ZESTRIL) 10 MG tablet   Oral   Take 10 mg by mouth daily.           Marland Kitchen oxyCODONE-acetaminophen (PERCOCET/ROXICET) 5-325 MG per tablet   Oral   Take 1 tablet by mouth every 8 (eight) hours as needed for pain.   15 tablet   0   . penicillin v potassium (VEETID) 500 MG tablet   Oral   Take 1 tablet (500 mg total) by mouth 4 (four) times daily. #40 filled 03/11/13   40 tablet   0     BP 166/102  Pulse 85  Temp(Src) 98.3 F (36.8 C) (Oral)  Resp 18  SpO2 96%  LMP 03/22/2013  Physical Exam  Constitutional: She appears well-developed and well-nourished. No distress.  HENT:  Head: Normocephalic and atraumatic.  Mouth/Throat: Uvula is midline, oropharynx is clear and moist and mucous membranes are normal. Normal dentition. Dental caries (Pts tooth shows no obvious abscess but moderate to severe tenderness to palpation of marked tooth) present. No edematous.    Eyes: Pupils are equal, round, and reactive to light.  Neck: Trachea normal, normal range of motion and full passive range of motion without pain. Neck supple.  Cardiovascular: Normal rate, regular rhythm, normal heart sounds and normal pulses.   Pulmonary/Chest: Effort normal and breath sounds normal. No respiratory distress. Chest wall is not dull to percussion. She exhibits no tenderness, no crepitus, no edema, no deformity and no retraction.  Abdominal: Normal appearance.  Musculoskeletal: Normal range of motion.  Neurological: She  is alert. She has normal strength.  Skin: Skin is warm, dry and intact. She is not diaphoretic.  Psychiatric: She has a normal mood and affect. Her speech is normal. Cognition and memory are normal.    ED Course  Procedures (including critical care time)  Labs Reviewed - No data to display No results found.   1. Pain, dental       MDM  Patient has dental pain. No emergent s/sx's present. Patent airway. No trismus.  Will be given pain medication and antibiotics. I discussed the need to call dentist within 24/48 hours for follow-up. Dental referral given. Return to ED precautions given.  Pt voiced understanding and has agreed to follow-up.         Dorthula Matas, PA-C 04/02/13 1226

## 2013-04-02 NOTE — ED Provider Notes (Signed)
Medical screening examination/treatment/procedure(s) were performed by non-physician practitioner and as supervising physician I was immediately available for consultation/collaboration.   Loren Racer, MD 04/02/13 [phone]

## 2013-04-02 NOTE — ED Notes (Signed)
PT ambulated with baseline gait; VSS; A&Ox3; no signs of distress; respirations even and unlabored; skin warm and dry; no questions upon discharge.  

## 2013-08-17 ENCOUNTER — Emergency Department (HOSPITAL_COMMUNITY)
Admission: EM | Admit: 2013-08-17 | Discharge: 2013-08-17 | Discharge status: Against Medical Advice | Payer: Self-pay | Attending: Emergency Medicine | Admitting: Emergency Medicine

## 2013-08-17 ENCOUNTER — Encounter (HOSPITAL_COMMUNITY): Payer: Self-pay | Admitting: Emergency Medicine

## 2013-08-17 DIAGNOSIS — F3289 Other specified depressive episodes: Secondary | ICD-10-CM | POA: Insufficient documentation

## 2013-08-17 DIAGNOSIS — D573 Sickle-cell trait: Secondary | ICD-10-CM | POA: Insufficient documentation

## 2013-08-17 DIAGNOSIS — J45909 Unspecified asthma, uncomplicated: Secondary | ICD-10-CM | POA: Insufficient documentation

## 2013-08-17 DIAGNOSIS — Z79899 Other long term (current) drug therapy: Secondary | ICD-10-CM | POA: Insufficient documentation

## 2013-08-17 DIAGNOSIS — M25519 Pain in unspecified shoulder: Secondary | ICD-10-CM | POA: Insufficient documentation

## 2013-08-17 DIAGNOSIS — M546 Pain in thoracic spine: Secondary | ICD-10-CM | POA: Insufficient documentation

## 2013-08-17 DIAGNOSIS — R079 Chest pain, unspecified: Secondary | ICD-10-CM | POA: Insufficient documentation

## 2013-08-17 DIAGNOSIS — E669 Obesity, unspecified: Secondary | ICD-10-CM | POA: Insufficient documentation

## 2013-08-17 DIAGNOSIS — F329 Major depressive disorder, single episode, unspecified: Secondary | ICD-10-CM | POA: Insufficient documentation

## 2013-08-17 DIAGNOSIS — R209 Unspecified disturbances of skin sensation: Secondary | ICD-10-CM | POA: Insufficient documentation

## 2013-08-17 DIAGNOSIS — I10 Essential (primary) hypertension: Secondary | ICD-10-CM | POA: Insufficient documentation

## 2013-08-17 DIAGNOSIS — F17200 Nicotine dependence, unspecified, uncomplicated: Secondary | ICD-10-CM | POA: Insufficient documentation

## 2013-08-17 HISTORY — DX: Obesity, unspecified: E66.9

## 2013-08-17 NOTE — ED Notes (Signed)
Pt did not respond to call from waiting room when bed became available.

## 2013-08-17 NOTE — ED Notes (Signed)
Pt did not respond to call from waiting room to go to x-ray

## 2013-08-17 NOTE — ED Notes (Signed)
Pt. reports intermittent mid chest pain radiating to both shoulders and upper back for 3 days with right facial numbness / fingers tingling.

## 2013-08-17 NOTE — ED Notes (Signed)
Attempted to draw blood, pt jerking, crying and moving around in chair.  Patient states, "they usually have to put in a PICC line when I have blood drawn".  Call to Phlebotomy for blood draw.

## 2013-08-31 ENCOUNTER — Emergency Department (HOSPITAL_COMMUNITY): Payer: Self-pay

## 2013-08-31 ENCOUNTER — Emergency Department (HOSPITAL_COMMUNITY)
Admission: EM | Admit: 2013-08-31 | Discharge: 2013-08-31 | Disposition: A | Discharge status: Home / Self Care | Payer: Self-pay | Attending: Emergency Medicine | Admitting: Emergency Medicine

## 2013-08-31 ENCOUNTER — Encounter (HOSPITAL_COMMUNITY): Payer: Self-pay | Admitting: Emergency Medicine

## 2013-08-31 DIAGNOSIS — J45901 Unspecified asthma with (acute) exacerbation: Secondary | ICD-10-CM | POA: Insufficient documentation

## 2013-08-31 DIAGNOSIS — Z862 Personal history of diseases of the blood and blood-forming organs and certain disorders involving the immune mechanism: Secondary | ICD-10-CM | POA: Insufficient documentation

## 2013-08-31 DIAGNOSIS — R5381 Other malaise: Secondary | ICD-10-CM | POA: Insufficient documentation

## 2013-08-31 DIAGNOSIS — Z3202 Encounter for pregnancy test, result negative: Secondary | ICD-10-CM | POA: Insufficient documentation

## 2013-08-31 DIAGNOSIS — R059 Cough, unspecified: Secondary | ICD-10-CM | POA: Insufficient documentation

## 2013-08-31 DIAGNOSIS — M546 Pain in thoracic spine: Secondary | ICD-10-CM | POA: Insufficient documentation

## 2013-08-31 DIAGNOSIS — Z79899 Other long term (current) drug therapy: Secondary | ICD-10-CM | POA: Insufficient documentation

## 2013-08-31 DIAGNOSIS — Z8659 Personal history of other mental and behavioral disorders: Secondary | ICD-10-CM | POA: Insufficient documentation

## 2013-08-31 DIAGNOSIS — Z791 Long term (current) use of non-steroidal anti-inflammatories (NSAID): Secondary | ICD-10-CM | POA: Insufficient documentation

## 2013-08-31 DIAGNOSIS — R3589 Other polyuria: Secondary | ICD-10-CM | POA: Insufficient documentation

## 2013-08-31 DIAGNOSIS — R358 Other polyuria: Secondary | ICD-10-CM | POA: Insufficient documentation

## 2013-08-31 DIAGNOSIS — M549 Dorsalgia, unspecified: Secondary | ICD-10-CM

## 2013-08-31 DIAGNOSIS — R509 Fever, unspecified: Secondary | ICD-10-CM | POA: Insufficient documentation

## 2013-08-31 DIAGNOSIS — R109 Unspecified abdominal pain: Secondary | ICD-10-CM | POA: Insufficient documentation

## 2013-08-31 DIAGNOSIS — R112 Nausea with vomiting, unspecified: Secondary | ICD-10-CM | POA: Insufficient documentation

## 2013-08-31 DIAGNOSIS — I10 Essential (primary) hypertension: Secondary | ICD-10-CM | POA: Insufficient documentation

## 2013-08-31 DIAGNOSIS — R350 Frequency of micturition: Secondary | ICD-10-CM | POA: Insufficient documentation

## 2013-08-31 DIAGNOSIS — R05 Cough: Secondary | ICD-10-CM | POA: Insufficient documentation

## 2013-08-31 DIAGNOSIS — F17200 Nicotine dependence, unspecified, uncomplicated: Secondary | ICD-10-CM | POA: Insufficient documentation

## 2013-08-31 DIAGNOSIS — R631 Polydipsia: Secondary | ICD-10-CM | POA: Insufficient documentation

## 2013-08-31 DIAGNOSIS — E669 Obesity, unspecified: Secondary | ICD-10-CM | POA: Insufficient documentation

## 2013-08-31 LAB — CBC WITH DIFFERENTIAL/PLATELET
HCT: 43.2 % (ref 36.0–46.0)
Hemoglobin: 14.9 g/dL (ref 12.0–15.0)
Lymphocytes Relative: 36 % (ref 12–46)
Lymphs Abs: 2.7 10*3/uL (ref 0.7–4.0)
Monocytes Absolute: 1 10*3/uL (ref 0.1–1.0)
Monocytes Relative: 13 % — ABNORMAL HIGH (ref 3–12)
Neutro Abs: 3.6 10*3/uL (ref 1.7–7.7)
Neutrophils Relative %: 48 % (ref 43–77)
RBC: 5.03 MIL/uL (ref 3.87–5.11)

## 2013-08-31 LAB — COMPREHENSIVE METABOLIC PANEL
Albumin: 3.2 g/dL — ABNORMAL LOW (ref 3.5–5.2)
Alkaline Phosphatase: 98 U/L (ref 39–117)
BUN: 12 mg/dL (ref 6–23)
CO2: 25 mEq/L (ref 19–32)
Chloride: 105 mEq/L (ref 96–112)
GFR calc Af Amer: 82 mL/min — ABNORMAL LOW (ref >90)
GFR calc non Af Amer: 70 mL/min — ABNORMAL LOW (ref >90)
Glucose, Bld: 102 mg/dL — ABNORMAL HIGH (ref 70–99)
Potassium: 3.7 mEq/L (ref 3.5–5.1)
Total Bilirubin: 0.2 mg/dL — ABNORMAL LOW (ref 0.3–1.2)

## 2013-08-31 LAB — URINALYSIS, ROUTINE W REFLEX MICROSCOPIC
Glucose, UA: NEGATIVE mg/dL
Ketones, ur: NEGATIVE mg/dL
Leukocytes, UA: NEGATIVE
pH: 6.5 (ref 5.0–8.0)

## 2013-08-31 LAB — PREGNANCY, URINE: Preg Test, Ur: NEGATIVE

## 2013-08-31 LAB — URINE MICROSCOPIC-ADD ON

## 2013-08-31 MED ORDER — ALBUTEROL SULFATE HFA 108 (90 BASE) MCG/ACT IN AERS: 1.0000 | INHALATION_SPRAY | Freq: Four times a day (QID) | RESPIRATORY_TRACT | Status: DC | PRN

## 2013-08-31 MED ORDER — METHOCARBAMOL 500 MG PO TABS: 500.0000 mg | ORAL_TABLET | Freq: Three times a day (TID) | ORAL | Status: DC | PRN

## 2013-08-31 MED ORDER — HYDROCODONE-ACETAMINOPHEN 5-325 MG PO TABS: 2.0000 | ORAL_TABLET | ORAL | Status: DC | PRN

## 2013-08-31 MED ORDER — ONDANSETRON HCL 4 MG/2ML IJ SOLN
4.0000 mg | Freq: Once | INTRAMUSCULAR | Status: AC
  Administered 2013-08-31: 4 mg via INTRAVENOUS
  Filled 2013-08-31: qty 2

## 2013-08-31 MED ORDER — IBUPROFEN 800 MG PO TABS: 800.0000 mg | ORAL_TABLET | Freq: Three times a day (TID) | ORAL | Status: DC

## 2013-08-31 MED ORDER — MORPHINE SULFATE 4 MG/ML IJ SOLN
4.0000 mg | INTRAMUSCULAR | Status: DC | PRN
  Administered 2013-08-31: 4 mg via INTRAVENOUS
  Filled 2013-08-31: qty 1

## 2013-08-31 MED ORDER — SODIUM CHLORIDE 0.9 % IV BOLUS (SEPSIS)
1000.0000 mL | Freq: Once | INTRAVENOUS | Status: AC
  Administered 2013-08-31: 1000 mL via INTRAVENOUS

## 2013-08-31 NOTE — ED Notes (Signed)
Pt began having upper back pain between scapula 2 weeks ago. Pt has become very short of breath with fever and vomiting for 3 days. Pt is extremely weak and does not feel right.

## 2013-08-31 NOTE — Progress Notes (Signed)
[facility] CL provided pt with a list of primary care resources and a [facility] orange card application.

## 2013-08-31 NOTE — ED Notes (Signed)
Patient transported to X-ray 

## 2013-08-31 NOTE — ED Notes (Signed)
Pt took excedrin and 2 extra strength tylenol this AM also she has complaining of extreme thirst for 4 days.

## 2013-08-31 NOTE — ED Provider Notes (Signed)
CSN: [id number]     Arrival date & time 08/31/13  1226 History   First MD Initiated Contact with Patient 08/31/13 1248     Chief Complaint  Patient presents with  . Shortness of Breath    Back pain    HPI  Patient has had pain in her midback for about 2 weeks. As her daily. Hurts to move. No fall or injury. Multitude of other symptoms. States she had a temperature up to 103 last 2 days previous and polyuria. She's thirsty drinking a lot. Not diabetic by history. No weight loss or gain she can tell. The cough as well. No neurological symptoms. No radiation to her lumbar spine. No numbness or weakness to the extremities.  Past Medical History  Diagnosis Date  . Sickle cell trait   . Hypertension   . Asthma   . Depression   . Obesity    Past Surgical History  Procedure Laterality Date  . No past surgeries     Family History  Problem Relation Age of Onset  . Other Neg Hx    History  Substance Use Topics  . Smoking status: Current Every Day Smoker -- 1.00 packs/day    Types: Cigarettes  . Smokeless tobacco: Never Used  . Alcohol Use: Yes   OB History   Grav Para Term Preterm Abortions TAB SAB Ect Mult Living   8 5  5 1 1    4      Review of Systems  Constitutional: Positive for fever and fatigue.  HENT: Negative for sore throat.   Eyes: Negative for visual disturbance.  Respiratory: Positive for cough.   Cardiovascular: Negative for chest pain.  Gastrointestinal: Positive for nausea and vomiting. Negative for diarrhea.  Endocrine: Positive for polydipsia and polyuria. Negative for polyphagia.  Genitourinary: Positive for frequency and flank pain. Negative for urgency, hematuria and pelvic pain.  Musculoskeletal: Positive for back pain and myalgias. Negative for gait problem and joint swelling.  Skin: Negative for rash.  Neurological: Positive for weakness. Negative for dizziness and numbness.    Allergies  Shellfish allergy  Home Medications   Current Outpatient  Rx  Name  Route  Sig  Dispense  Refill  . acetaminophen (TYLENOL) 500 MG tablet   Oral   Take 1,000 mg by mouth 3 (three) times daily as needed for pain.         Marland Kitchen albuterol (PROVENTIL HFA;VENTOLIN HFA) 108 (90 BASE) MCG/ACT inhaler   Inhalation   Inhale 2 puffs into the lungs every 4 (four) hours as needed for wheezing or shortness of breath.         Marland Kitchen albuterol (PROVENTIL) (2.5 MG/3ML) 0.083% nebulizer solution   Nebulization   Take 2.5 mg by nebulization every 4 (four) hours as needed for wheezing or shortness of breath.          . lisinopril (PRINIVIL,ZESTRIL) 10 MG tablet   Oral   Take 10 mg by mouth daily.           Marland Kitchen HYDROcodone-acetaminophen (NORCO/VICODIN) 5-325 MG per tablet   Oral   Take 2 tablets by mouth every 4 (four) hours as needed.   10 tablet   0   . ibuprofen (ADVIL,MOTRIN) 800 MG tablet   Oral   Take 1 tablet (800 mg total) by mouth 3 (three) times daily.   21 tablet   0   . methocarbamol (ROBAXIN) 500 MG tablet   Oral   Take 1 tablet (500 mg total) by  mouth 3 (three) times daily between meals as needed.   20 tablet   0    BP 167/108  Pulse 89  Temp(Src) 98.5 F (36.9 C) (Oral)  Resp 24  SpO2 98%  LMP 08/30/2013 Physical Exam  Constitutional: She is oriented to person, place, and time. She appears well-developed and well-nourished. No distress.  Initially laying on her left side. Has pain with any movement.  HENT:  Head: Normocephalic.  Conjunctiva not pale. Sclera anicteric  Eyes: Conjunctivae are normal. Pupils are equal, round, and reactive to light. No scleral icterus.  Neck: Normal range of motion. Neck supple. No thyromegaly present.  Cardiovascular: Normal rate and regular rhythm.  Exam reveals no gallop and no friction rub.   No murmur heard. Pulmonary/Chest: Effort normal and breath sounds normal. No respiratory distress. She has no wheezes. She has no rales.  Clear bilateral breath sounds  Abdominal: Soft. Bowel sounds are  normal. She exhibits no distension. There is no tenderness. There is no rebound.   abdomen soft benign  Genitourinary:  CVA tenderness bilaterally  Musculoskeletal: Normal range of motion.  Neurological: She is alert and oriented to person, place, and time.  No radicular pain. Normal use and strength and sensation lower extremities.  Skin: Skin is warm and dry. No rash noted.  No skin rash  Psychiatric: She has a normal mood and affect. Her behavior is normal.    ED Course  Procedures (including critical care time) Labs Review Labs Reviewed  URINALYSIS, ROUTINE W REFLEX MICROSCOPIC - Abnormal; Notable for the following:    Hgb urine dipstick SMALL (*)    Protein, ur 30 (*)    All other components within normal limits  CBC WITH DIFFERENTIAL - Abnormal; Notable for the following:    Monocytes Relative 13 (*)    All other components within normal limits  COMPREHENSIVE METABOLIC PANEL - Abnormal; Notable for the following:    Glucose, Bld 102 (*)    Albumin 3.2 (*)    Total Bilirubin 0.2 (*)    GFR calc non Af Amer 70 (*)    GFR calc Af Amer 82 (*)    All other components within normal limits  PREGNANCY, URINE  URINE MICROSCOPIC-ADD ON   Imaging Review Ct Abdomen Pelvis Wo Contrast  08/31/2013   CLINICAL DATA:  Weakness, hematuria, back pain  EXAM: CT ABDOMEN AND PELVIS WITHOUT CONTRAST  TECHNIQUE: Multidetector CT imaging of the abdomen and pelvis was performed following the standard protocol without intravenous contrast.  COMPARISON:  01/14/2009  FINDINGS: Sagittal images of the spine are unremarkable. There are streaky artifacts from patient's large body habitus.  Lung bases are unremarkable. Unenhanced liver shows no biliary ductal dilatation. Unenhanced pancreas, spleen and adrenal glands are unremarkable. No calcified gallstones are noted within gallbladder.  Unenhanced kidneys are symmetrical in size. No nephrolithiasis. No hydronephrosis or hydroureter. No calcified ureteral  calculi are noted.  No small bowel obstruction. No ascites or free air. No adenopathy.  There is no pericecal inflammation. Normal retrocecal appendix clearly visualized axial image 56.  Unenhanced uterus is unremarkable. The urinary bladder is unremarkable. Bilateral distal ureter is unremarkable. There is 1.7 cm cyst/follicle within right ovary.  IMPRESSION: 1. No nephrolithiasis. No hydronephrosis or hydroureter. 2. No calcified ureteral calculi. 3. No pericecal inflammation. Normal appendix. 4. Cyst/follicle within right ovary measures 1.7 cm.   Electronically Signed   By: Natasha Mead M.D.   On: 08/31/2013 14:43   Dg Chest 2 View  08/31/2013  CLINICAL DATA:  Shortness of breath, chest pain.  EXAM: CHEST  2 VIEW  COMPARISON:  06/19/2012  FINDINGS: Slight peribronchial thickening. Heart and mediastinal contours are within normal limits. No focal opacities or effusions. No acute bony abnormality.  IMPRESSION: Slight bronchitic changes.   Electronically Signed   By: Charlett Nose M.D.   On: 08/31/2013 14:36    EKG Interpretation   None       MDM   1. Back pain      X-ray shows no infiltrate. Urine showed some blood but no infection. CT stone protocol shows no ureteral stone.  No spinal abnormalities.  Discussion I think her pain is musculoskeletal. This may be a simple myalgias. No documented fever here. Subjective fever at home. No evidence clinically of pneumonia. No  Evidence of pneumonia. No urinary tract infection. No history of IV use drug use or suspicious that this would be epidural or spinal infection. She has no radicular findings or symptoms.  Having symptomatic treatment with anti-inflammatories muscle relaxants rest fluid hydration as well as indicated. His recurrent symptoms fevers chills or other symptoms recheck here.    Roney Marion, MD 08/31/13 [phone]

## 2013-10-08 ENCOUNTER — Other Ambulatory Visit: Payer: Self-pay | Admitting: Obstetrics & Gynecology

## 2013-10-28 ENCOUNTER — Other Ambulatory Visit: Payer: Self-pay | Admitting: Obstetrics & Gynecology

## 2014-01-13 ENCOUNTER — Emergency Department (HOSPITAL_COMMUNITY)
Admission: EM | Admit: 2014-01-13 | Discharge: 2014-01-13 | Disposition: A | Discharge status: Home / Self Care | Payer: Self-pay | Attending: Emergency Medicine | Admitting: Emergency Medicine

## 2014-01-13 ENCOUNTER — Emergency Department (HOSPITAL_COMMUNITY): Payer: Self-pay

## 2014-01-13 ENCOUNTER — Encounter (HOSPITAL_COMMUNITY): Payer: Self-pay | Admitting: Emergency Medicine

## 2014-01-13 DIAGNOSIS — R079 Chest pain, unspecified: Secondary | ICD-10-CM

## 2014-01-13 DIAGNOSIS — R110 Nausea: Secondary | ICD-10-CM | POA: Insufficient documentation

## 2014-01-13 DIAGNOSIS — R0789 Other chest pain: Secondary | ICD-10-CM | POA: Insufficient documentation

## 2014-01-13 DIAGNOSIS — Z862 Personal history of diseases of the blood and blood-forming organs and certain disorders involving the immune mechanism: Secondary | ICD-10-CM | POA: Insufficient documentation

## 2014-01-13 DIAGNOSIS — F411 Generalized anxiety disorder: Secondary | ICD-10-CM | POA: Insufficient documentation

## 2014-01-13 DIAGNOSIS — E669 Obesity, unspecified: Secondary | ICD-10-CM | POA: Insufficient documentation

## 2014-01-13 DIAGNOSIS — J45909 Unspecified asthma, uncomplicated: Secondary | ICD-10-CM | POA: Insufficient documentation

## 2014-01-13 DIAGNOSIS — I10 Essential (primary) hypertension: Secondary | ICD-10-CM | POA: Insufficient documentation

## 2014-01-13 DIAGNOSIS — IMO0001 Reserved for inherently not codable concepts without codable children: Secondary | ICD-10-CM | POA: Insufficient documentation

## 2014-01-13 DIAGNOSIS — Z79899 Other long term (current) drug therapy: Secondary | ICD-10-CM | POA: Insufficient documentation

## 2014-01-13 DIAGNOSIS — R209 Unspecified disturbances of skin sensation: Secondary | ICD-10-CM | POA: Insufficient documentation

## 2014-01-13 DIAGNOSIS — R51 Headache: Secondary | ICD-10-CM | POA: Insufficient documentation

## 2014-01-13 DIAGNOSIS — R519 Headache, unspecified: Secondary | ICD-10-CM

## 2014-01-13 DIAGNOSIS — F17200 Nicotine dependence, unspecified, uncomplicated: Secondary | ICD-10-CM | POA: Insufficient documentation

## 2014-01-13 LAB — CBC
HCT: 42.4 % (ref 36.0–46.0)
Hemoglobin: 14.3 g/dL (ref 12.0–15.0)
MCH: 28.4 pg (ref 26.0–34.0)
MCHC: 33.7 g/dL (ref 30.0–36.0)
MCV: 84.3 fL (ref 78.0–100.0)
PLATELETS: 229 10*3/uL (ref 150–400)
RBC: 5.03 MIL/uL (ref 3.87–5.11)
RDW: 14.2 % (ref 11.5–15.5)
WBC: 6.8 10*3/uL (ref 4.0–10.5)

## 2014-01-13 LAB — I-STAT TROPONIN, ED
TROPONIN I, POC: 0 ng/mL (ref 0.00–0.08)
Troponin i, poc: 0 ng/mL (ref 0.00–0.08)

## 2014-01-13 LAB — BASIC METABOLIC PANEL
BUN: 11 mg/dL (ref 6–23)
CALCIUM: 9 mg/dL (ref 8.4–10.5)
CO2: 25 meq/L (ref 19–32)
CREATININE: 1.01 mg/dL (ref 0.50–1.10)
Chloride: 103 mEq/L (ref 96–112)
GFR calc Af Amer: 76 mL/min — ABNORMAL LOW (ref >90)
GFR calc non Af Amer: 66 mL/min — ABNORMAL LOW (ref >90)
Glucose, Bld: 142 mg/dL — ABNORMAL HIGH (ref 70–99)
Potassium: 3.9 mEq/L (ref 3.7–5.3)
Sodium: 140 mEq/L (ref 137–147)

## 2014-01-13 LAB — CBG MONITORING, ED: Glucose-Capillary: 109 mg/dL — ABNORMAL HIGH (ref 70–99)

## 2014-01-13 MED ORDER — METOCLOPRAMIDE HCL 5 MG/ML IJ SOLN
10.0000 mg | Freq: Once | INTRAMUSCULAR | Status: AC
Start: 2014-01-13 — End: 2014-01-13
  Administered 2014-01-13: 10 mg via INTRAVENOUS
  Filled 2014-01-13: qty 2

## 2014-01-13 MED ORDER — NITROGLYCERIN 0.4 MG SL SUBL
0.4000 mg | SUBLINGUAL_TABLET | SUBLINGUAL | Status: DC | PRN
  Administered 2014-01-13: 0.4 mg via SUBLINGUAL

## 2014-01-13 MED ORDER — DIPHENHYDRAMINE HCL 50 MG/ML IJ SOLN
25.0000 mg | Freq: Once | INTRAMUSCULAR | Status: AC
  Administered 2014-01-13: 25 mg via INTRAVENOUS
  Filled 2014-01-13: qty 1

## 2014-01-13 MED ORDER — DEXAMETHASONE SODIUM PHOSPHATE 10 MG/ML IJ SOLN
10.0000 mg | Freq: Once | INTRAMUSCULAR | Status: AC
Start: 2014-01-13 — End: 2014-01-13
  Administered 2014-01-13: 10 mg via INTRAVENOUS
  Filled 2014-01-13: qty 1

## 2014-01-13 MED ORDER — KETOROLAC TROMETHAMINE 30 MG/ML IJ SOLN
30.0000 mg | Freq: Once | INTRAMUSCULAR | Status: AC
Start: 2014-01-13 — End: 2014-01-13
  Administered 2014-01-13: 30 mg via INTRAVENOUS
  Filled 2014-01-13: qty 1

## 2014-01-13 MED ORDER — NAPROXEN 500 MG PO TABS: 500.0000 mg | ORAL_TABLET | Freq: Two times a day (BID) | ORAL | Status: DC

## 2014-01-13 MED ORDER — LISINOPRIL 10 MG PO TABS: 10.0000 mg | ORAL_TABLET | Freq: Two times a day (BID) | ORAL | Status: DC

## 2014-01-13 MED ORDER — SODIUM CHLORIDE 0.9 % IV BOLUS (SEPSIS)
1000.0000 mL | Freq: Once | INTRAVENOUS | Status: AC
  Administered 2014-01-13: 1000 mL via INTRAVENOUS

## 2014-01-13 MED ORDER — MORPHINE SULFATE 4 MG/ML IJ SOLN
4.0000 mg | Freq: Once | INTRAMUSCULAR | Status: AC
Start: 2014-01-13 — End: 2014-01-13
  Administered 2014-01-13: 4 mg via INTRAVENOUS
  Filled 2014-01-13: qty 1

## 2014-01-13 NOTE — Progress Notes (Signed)
CARE MANAGEMENT ED NOTE 01/13/2014  Patient:  Taylor Vazquez, Taylor Vazquez   Account Number:  [id number]  Date Initiated:  01/13/2014  Documentation initiated by:  Jackelyn Poling  Subjective/Objective Assessment:   48 yr old self pay [place] pt c/o chest pain & headache s/s for 4 days PMH sickle cell trait, HTn, asthma, depression, obesity CM referral from [facility] staff, Stacy for possible medication assistance     Subjective/Objective Assessment Detail:   Pt last seen at [facility] on 08/31/13 for back pain She confirms she got her last Rx for Lisinopril from [facility] ED  Pt mentioned to EDP that she had used xanax & Prilosec previously  Pt states preference of pharmacy is [facility] on [address] & has a [employer] near her home  CM initially denied having sickle cell trait but later in assessment she stated she previously seen at previous [facility] clinic off [address] [place]  Pt [facility] Leward Quan & Dr Wynona Canes at [facility] outpatient center  Pt confirms with CM she has a nebulizer at home & [facility] cost for albuterol inhaler is $54  Reports previously having medicaid & her plans to go to sign up for orange card & to go to [place] on 01/13/14  Pt noted to be talking fast, sitting up & down in bed during assessment  Daughter voiced understanding of resources CM reviewed with pt     Action/Plan:   Cm reviewed EPIC labs, EDP notes with med list, CM spoke with pt with daughter at bedside, CM reviewed the below mentioned resources CM present during EDP assessment of pt   Action/Plan Detail:   CM inquired if pt's daughter understood resources Cm reviewed with pt  CM wrote on resources provided that Dr Clayborn Bigness had transferred to [facility]  Discussed with pt that she can fill Rx at any pharmacy/transfer meds prn   Anticipated DC Date:  01/13/2014     Status Recommendation to Physician:   Result of Recommendation:    Other ED Scraper / [facility] (established/new)  PCP issues  Other  Outpatient Services - Pt will follow up  Medication Assistance  CM consult    Choice offered to / List presented to:            Status of service:  Completed, signed off  ED Comments:   ED Comments Detail:  Stormy Fabian  Signed  Progress Notes Service date: 01/13/2014 11:32 AM [facility] CL provided pt with a list of primary care resources and a [facility] [employer] application. Also, provided pt with same information on last ED visit on 08/31/13. Patient stated that she was going to DSS today to see about [employer] but stated that they started seeing people to early. Patient stated that she had not had a PCP since going to [facility]. Patient stated that she needed help getting medications. CL explained to patient that if she contacted [facility] either by calling or going to the office, they would fill her medications at there pharmacy, but only if she made a follow-up apt. Patient stated that she would see about getting an apt there.   Cm discussed with pt specialists for headaches, migraines as neurologists Pt stated she has had head CM encouraged pt to have chronic illness managed by a pcp vs EDs.  Encouraged pt to follow up  with resources offered by [facility] staff.  CM spoke with pt who confirms self pay [facility] resident with no pcp. CM discussed and provided written information for self pay pcps, [place] [employer] to assist with med co pays, importance of pcp for f/u care, www.needymeds.org, discounted pharmacies and other [employer] such as financial assistance, DSS and  health department  Reviewed resources for [employer] self pay pcps like [facility], [employer], family medicine at [address], [facility] family practice, general medical clinics, [facility] urgent care plus others, [facility] out [facility]  at [employer] and housing Pt voiced understanding and appreciation of resources provided  CM discussed the differences in albuterol inhaler & albuterol nebulizer costs at [employer] provided with a [facility] $4 med list indicating her albuterol neb and lisinopril are available  CM discussed with pt the [facility] facilities do not provide "free medicines" when she inquired about getting her medications from ED to leave with today, 01/13/14.  Pt informed CM she had not requested medication assistance since 2012 and "did not know" Discussed that there is a program to assist with a discounted co pay.  Pt voiced understanding but stated she had no money but is not homeless

## 2014-01-13 NOTE — Progress Notes (Signed)
[facility] CL provided pt with a list of primary care resources and a [facility] [employer] application. Also, provided pt with same information on last ED visit on 08/31/13. Patient stated that she was going to DSS today to see about [employer] but stated that they started seeing people to early. Patient stated that she had not had a PCP since going to [facility]. Patient stated that she needed help getting medications. CL explained to patient that if she contacted [facility] [place] either by calling or going to the office, they would fill her medications at there pharmacy, but only if she made a follow-up apt. Patient stated that she would see about getting an apt there.

## 2014-01-13 NOTE — Discharge Instructions (Signed)
Call for a follow up appointment with a Family or Primary Care Provider.  Call a Cardiologist for further evaluation of your chest pain. Call a Neurologist for further evaluation of your headache. Return if Symptoms worsen.   Take medication as prescribed.

## 2014-01-13 NOTE — ED Notes (Signed)
Pt reports chest pain and chest pressure x4 days, now pain in left arm and upper back 10/10, reports shortness of breath and feels dehydrated. Pt reports she also has been having headaches and vein in left forehead pops out and "she is afraid to go to sleep because she might have an aneurysm". Also left chin numbness "feels like needles in her face". Pt reports hx of a heart a few years ago. Reports nausea. Denies vomiting/ diarrhea.

## 2014-01-13 NOTE — ED Provider Notes (Signed)
CSN: [id number]     Arrival date & time 01/13/14  9518 History   First MD Initiated Contact with Patient 01/13/14 1005     Chief Complaint  Patient presents with  . Chest Pain  . Headache     (Consider location/radiation/quality/duration/timing/severity/associated sxs/prior Treatment) HPI Comments: Taylor Vazquez is a 48 y.o. female with a past medical history of HTN, Obesity, Asthma, presenting the Emergency Department with a chief complaint of 4 days of chest discomfort.  The patient reports a gradual onset of left sided chest discomfort.  She describes the non-radiating discomfort as sharp, pressure, constant. The patient reports onset of discomfort while sitting.She reports associated Left arm pain, neck pain. She reports intermitent headache for several months.    Denies history of MI, reports early MI in her father, no history of stress test.  The patient is also stating that her daughter needs to stay at home from work with her for the next three nights incase something happens to her.  Pt lives alone. No PCP   The history is provided by the patient and medical records. No language interpreter was used.    Past Medical History  Diagnosis Date  . Sickle cell trait   . Hypertension   . Asthma   . Depression   . Obesity    Past Surgical History  Procedure Laterality Date  . No past surgeries     Family History  Problem Relation Age of Onset  . Other Neg Hx    History  Substance Use Topics  . Smoking status: Current Every Day Smoker -- 1.00 packs/day    Types: Cigarettes  . Smokeless tobacco: Never Used  . Alcohol Use: Yes   OB History   Grav Para Term Preterm Abortions TAB SAB Ect Mult Living   8 5  5 1 1    4      Review of Systems  Constitutional: Negative for fever and chills.  Cardiovascular: Positive for chest pain. Negative for palpitations and leg swelling.  Gastrointestinal: Positive for nausea. Negative for vomiting, abdominal pain and constipation.   Musculoskeletal: Positive for myalgias.  Neurological: Positive for numbness and headaches. Negative for facial asymmetry, weakness and light-headedness.      Allergies  Bee venom and Shellfish allergy  Home Medications   Current Outpatient Rx  Name  Route  Sig  Dispense  Refill  . albuterol (PROVENTIL HFA;VENTOLIN HFA) 108 (90 BASE) MCG/ACT inhaler   Inhalation   Inhale 2 puffs into the lungs every 4 (four) hours as needed for wheezing or shortness of breath.         Marland Kitchen albuterol (PROVENTIL) (2.5 MG/3ML) 0.083% nebulizer solution   Nebulization   Take 2.5 mg by nebulization every 4 (four) hours as needed for wheezing or shortness of breath.          Marland Kitchen aspirin-acetaminophen-caffeine (EXCEDRIN EXTRA STRENGTH) 841-660-63 MG per tablet   Oral   Take 2-3 tablets by mouth every 4 (four) hours as needed for headache (For pain).         Marland Kitchen lisinopril (PRINIVIL,ZESTRIL) 10 MG tablet   Oral   Take 10 mg by mouth daily.           Sallye Lat (VISINE ADVANCED RELIEF) 0.05-0.1-1-1 % SOLN   Ophthalmic   Apply 1 drop to eye 3 (three) times daily as needed (For drys eyes).          BP 129/91  Pulse 90  Temp(Src) 97.9 F (36.6 C) (  Oral)  Resp 18  SpO2 97%  LMP 12/29/2013 Physical Exam  Nursing note and vitals reviewed. Constitutional: She is oriented to person, place, and time. She appears well-developed and well-nourished. No distress.  Exam limited by patient's body habitus.    HENT:  Head: Normocephalic and atraumatic.  Eyes: EOM are normal. Pupils are equal, round, and reactive to light. No scleral icterus.  Neck: Neck supple.  Cardiovascular: Normal rate, regular rhythm and normal heart sounds.   No murmur heard. Pulses:      Radial pulses are 2+ on the right side, and 2+ on the left side.  No lower extremity edema  Pulmonary/Chest: Effort normal and breath sounds normal. She has no wheezes.  Abdominal: Soft. Bowel sounds are normal. There is  no tenderness. There is no rebound and no guarding.  Musculoskeletal: Normal range of motion. She exhibits no edema.  Neurological: She is alert and oriented to person, place, and time. She has normal strength. She is not disoriented. No cranial nerve deficit or sensory deficit. She exhibits normal muscle tone. Gait normal. GCS eye subscore is 4. GCS verbal subscore is 5. GCS motor subscore is 6.  Reflex Scores:      Bicep reflexes are 2+ on the right side and 2+ on the left side. Speech is clear and goal oriented, follows commands Cranial nerves III - XII grossly intact, no facial droop Normal strength in upper and lower extremities bilaterally, strong and equal grip strength Sensation normal to light touch Moves all 4 extremities without ataxia, coordination intact Normal finger to nose and rapid alternating movements Normal gait No pronator drift    Skin: Skin is warm and dry. No rash noted. She is not diaphoretic.  Psychiatric: Her speech is normal and behavior is normal. Her mood appears anxious.    ED Course  Procedures (including critical care time) Labs Review Labs Reviewed  BASIC METABOLIC PANEL - Abnormal; Notable for the following:    Glucose, Bld 142 (*)    GFR calc non Af Amer 66 (*)    GFR calc Af Amer 76 (*)    All other components within normal limits  CBG MONITORING, ED - Abnormal; Notable for the following:    Glucose-Capillary 109 (*)    All other components within normal limits  CBC  I-STAT TROPOININ, ED  I-STAT TROPOININ, ED   Imaging Review Dg Chest 2 View  01/13/2014   CLINICAL DATA:  Left facial numbness  EXAM: CHEST  2 VIEW  COMPARISON:  DG CHEST 2 VIEW dated 08/31/2013  FINDINGS: The heart size and mediastinal contours are within normal limits. Both lungs are clear. The visualized skeletal structures are unremarkable.  IMPRESSION: No active cardiopulmonary disease.   Electronically Signed   By: Kathreen Devoid   On: 01/13/2014 11:18     EKG  Interpretation   Date/Time:  Wednesday January 13 2014 09:47:35 EDT Ventricular Rate:  89 PR Interval:  141 QRS Duration: 77 QT Interval:  374 QTC Calculation: 455 R Axis:   10 Text Interpretation:  Sinus rhythm Right atrial enlargement Baseline  wander in lead(s) V4 Similar to prior Confirmed by [facility]  MD, [place]  ([id number]) on 01/13/2014 10:06:16 AM      MDM   Final diagnoses:  Chest pain  Headache   Pt presents with atypical chest discomfort for 4 days, and head ache for 3 days.  No neuro deficits on exam. EKG shows Right Atrial enlargement. Pt is anxious, requesting a note for her  daughter to stay at home from work the next 3 days because she feels as if something bad is going to happen. Troponin negative. Cbc without abnormalities. BMP shows elevated glucose 142. Re-eval: pt reports chest discomfort has improved with medication but complains of ongoing headache. Toradol ordered Dr. Mingo Amber also evaluated the patient, migraine cocktail ordered. Advised second troponin. EMR shows previous chest discomfort with a history of cocaine use. Per the cardiology not there was no elevation in cardiac enzymes or changes on EKG.  Re-eval Pt reports headache has improved and is requesting to be discharged home. Troponin x 2 negative.  Discussed patient history, condition, and labs with Dr. Mingo Amber, agrees the patient can be evaluated as an out-pt. Will have pt follow up for ongoing headaches and chest discomfort. Discussed lab results, imaging results, and treatment plan with the patient. Return precautions given. Reports understanding and no other concerns at this time.  Patient is stable for discharge at this time.   Meds given in ED:  Medications  nitroGLYCERIN (NITROSTAT) SL tablet 0.4 mg (0.4 mg Sublingual Given 01/13/14 1323)  sodium chloride 0.9 % bolus 1,000 mL (0 mLs Intravenous Stopped 01/13/14 1220)  morphine 4 MG/ML injection 4 mg (4 mg Intravenous Given 01/13/14 1104)  ketorolac  (TORADOL) 30 MG/ML injection 30 mg (30 mg Intravenous Given 01/13/14 1259)  metoCLOPramide (REGLAN) injection 10 mg (10 mg Intravenous Given 01/13/14 1327)  dexamethasone (DECADRON) injection 10 mg (10 mg Intravenous Given 01/13/14 1328)  diphenhydrAMINE (BENADRYL) injection 25 mg (25 mg Intravenous Given 01/13/14 1326)    New Prescriptions   LISINOPRIL (PRINIVIL,ZESTRIL) 10 MG TABLET    Take 1 tablet (10 mg total) by mouth 2 (two) times daily.   NAPROXEN (NAPROSYN) 500 MG TABLET    Take 1 tablet (500 mg total) by mouth 2 (two) times daily.        Lorrine Kin, PA-C 01/15/14 [phone]

## 2014-01-18 NOTE — ED Provider Notes (Signed)
Medical screening examination/treatment/procedure(s) were conducted as a shared visit with non-physician practitioner(s) and myself.  I personally evaluated the patient during the encounter.   EKG Interpretation   Date/Time:  Wednesday January 13 2014 09:47:35 EDT Ventricular Rate:  89 PR Interval:  141 QRS Duration: 77 QT Interval:  374 QTC Calculation: 455 R Axis:   10 Text Interpretation:  Sinus rhythm Right atrial enlargement Baseline  wander in lead(s) V4 Similar to prior Confirmed by Mingo Amber  MD, [place]  ([id number]) on 01/13/2014 10:06:16 AM      Patient here with chest pain, headache. Headache: Hx of migraines, had a migraine for several days now. No thunderclap onset, began gradual, slowly got worse. No neuro deficits. Headache improved after migraine cocktail. CP: Here with atypical chest pain. Present for 4 days, states constant. EKG similar to prior, hx of cocaine use. Patient has negative serial troponins, stable for discharge.  Osvaldo Shipper, MD 01/18/14 [phone]

## 2014-03-21 ENCOUNTER — Emergency Department (HOSPITAL_COMMUNITY)
Admission: EM | Admit: 2014-03-21 | Discharge: 2014-03-21 | Disposition: A | Discharge status: Home / Self Care | Payer: Self-pay | Attending: Emergency Medicine | Admitting: Emergency Medicine

## 2014-03-21 ENCOUNTER — Encounter (HOSPITAL_COMMUNITY): Payer: Self-pay | Admitting: Emergency Medicine

## 2014-03-21 DIAGNOSIS — Z8659 Personal history of other mental and behavioral disorders: Secondary | ICD-10-CM | POA: Insufficient documentation

## 2014-03-21 DIAGNOSIS — Z7982 Long term (current) use of aspirin: Secondary | ICD-10-CM | POA: Insufficient documentation

## 2014-03-21 DIAGNOSIS — R05 Cough: Secondary | ICD-10-CM | POA: Insufficient documentation

## 2014-03-21 DIAGNOSIS — I10 Essential (primary) hypertension: Secondary | ICD-10-CM | POA: Insufficient documentation

## 2014-03-21 DIAGNOSIS — M549 Dorsalgia, unspecified: Secondary | ICD-10-CM | POA: Insufficient documentation

## 2014-03-21 DIAGNOSIS — Z79899 Other long term (current) drug therapy: Secondary | ICD-10-CM | POA: Insufficient documentation

## 2014-03-21 DIAGNOSIS — Z720 Tobacco use: Secondary | ICD-10-CM

## 2014-03-21 DIAGNOSIS — J45909 Unspecified asthma, uncomplicated: Secondary | ICD-10-CM | POA: Insufficient documentation

## 2014-03-21 DIAGNOSIS — R109 Unspecified abdominal pain: Secondary | ICD-10-CM | POA: Insufficient documentation

## 2014-03-21 DIAGNOSIS — R059 Cough, unspecified: Secondary | ICD-10-CM | POA: Insufficient documentation

## 2014-03-21 DIAGNOSIS — Z862 Personal history of diseases of the blood and blood-forming organs and certain disorders involving the immune mechanism: Secondary | ICD-10-CM | POA: Insufficient documentation

## 2014-03-21 DIAGNOSIS — F17200 Nicotine dependence, unspecified, uncomplicated: Secondary | ICD-10-CM | POA: Insufficient documentation

## 2014-03-21 DIAGNOSIS — T464X5A Adverse effect of angiotensin-converting-enzyme inhibitors, initial encounter: Secondary | ICD-10-CM

## 2014-03-21 DIAGNOSIS — F1010 Alcohol abuse, uncomplicated: Secondary | ICD-10-CM | POA: Insufficient documentation

## 2014-03-21 DIAGNOSIS — E669 Obesity, unspecified: Secondary | ICD-10-CM | POA: Insufficient documentation

## 2014-03-21 DIAGNOSIS — T465X5A Adverse effect of other antihypertensive drugs, initial encounter: Secondary | ICD-10-CM | POA: Insufficient documentation

## 2014-03-21 LAB — URINALYSIS, ROUTINE W REFLEX MICROSCOPIC
BILIRUBIN URINE: NEGATIVE
Glucose, UA: NEGATIVE mg/dL
KETONES UR: NEGATIVE mg/dL
Leukocytes, UA: NEGATIVE
NITRITE: NEGATIVE
PH: 5.5 (ref 5.0–8.0)
Protein, ur: 100 mg/dL — AB
Specific Gravity, Urine: 1.017 (ref 1.005–1.030)
Urobilinogen, UA: 1 mg/dL (ref 0.0–1.0)

## 2014-03-21 LAB — CBC WITH DIFFERENTIAL/PLATELET
BASOS PCT: 0 % (ref 0–1)
Basophils Absolute: 0 10*3/uL (ref 0.0–0.1)
Eosinophils Absolute: 0.4 10*3/uL (ref 0.0–0.7)
Eosinophils Relative: 4 % (ref 0–5)
HCT: 40.1 % (ref 36.0–46.0)
Hemoglobin: 13.9 g/dL (ref 12.0–15.0)
Lymphocytes Relative: 31 % (ref 12–46)
Lymphs Abs: 2.7 10*3/uL (ref 0.7–4.0)
MCH: 29 pg (ref 26.0–34.0)
MCHC: 34.7 g/dL (ref 30.0–36.0)
MCV: 83.7 fL (ref 78.0–100.0)
Monocytes Absolute: 1 10*3/uL (ref 0.1–1.0)
Monocytes Relative: 12 % (ref 3–12)
NEUTROS PCT: 53 % (ref 43–77)
Neutro Abs: 4.6 10*3/uL (ref 1.7–7.7)
PLATELETS: 256 10*3/uL (ref 150–400)
RBC: 4.79 MIL/uL (ref 3.87–5.11)
RDW: 14.2 % (ref 11.5–15.5)
WBC: 8.8 10*3/uL (ref 4.0–10.5)

## 2014-03-21 LAB — COMPREHENSIVE METABOLIC PANEL
ALK PHOS: 103 U/L (ref 39–117)
ALT: 13 U/L (ref 0–35)
AST: 17 U/L (ref 0–37)
Albumin: 3.1 g/dL — ABNORMAL LOW (ref 3.5–5.2)
BUN: 17 mg/dL (ref 6–23)
CALCIUM: 9.2 mg/dL (ref 8.4–10.5)
CO2: 20 meq/L (ref 19–32)
Chloride: 107 mEq/L (ref 96–112)
Creatinine, Ser: 1.22 mg/dL — ABNORMAL HIGH (ref 0.50–1.10)
GFR, EST AFRICAN AMERICAN: 61 mL/min — AB (ref >90)
GFR, EST NON AFRICAN AMERICAN: 52 mL/min — AB (ref >90)
Glucose, Bld: 102 mg/dL — ABNORMAL HIGH (ref 70–99)
POTASSIUM: 4.3 meq/L (ref 3.7–5.3)
Sodium: 141 mEq/L (ref 137–147)
Total Bilirubin: <0.2 mg/dL — ABNORMAL LOW (ref 0.3–1.2)
Total Protein: 7 g/dL (ref 6.0–8.3)

## 2014-03-21 LAB — URINE MICROSCOPIC-ADD ON

## 2014-03-21 LAB — LIPASE, BLOOD: LIPASE: 45 U/L (ref 11–59)

## 2014-03-21 LAB — I-STAT TROPONIN, ED: Troponin i, poc: 0.01 ng/mL (ref 0.00–0.08)

## 2014-03-21 MED ORDER — PANTOPRAZOLE SODIUM 40 MG PO TBEC
40.0000 mg | DELAYED_RELEASE_TABLET | Freq: Once | ORAL | Status: AC
  Administered 2014-03-21: 40 mg via ORAL
  Filled 2014-03-21: qty 1

## 2014-03-21 MED ORDER — OXYCODONE-ACETAMINOPHEN 5-325 MG PO TABS
1.0000 | ORAL_TABLET | Freq: Once | ORAL | Status: AC
  Administered 2014-03-21: 1 via ORAL
  Filled 2014-03-21: qty 1

## 2014-03-21 MED ORDER — PANTOPRAZOLE SODIUM 20 MG PO TBEC: 20.0000 mg | DELAYED_RELEASE_TABLET | Freq: Every day | ORAL | Status: DC

## 2014-03-21 MED ORDER — GI COCKTAIL ~~LOC~~
30.0000 mL | Freq: Once | ORAL | Status: AC
  Administered 2014-03-21: 30 mL via ORAL
  Filled 2014-03-21: qty 30

## 2014-03-21 MED ORDER — HYDROCHLOROTHIAZIDE 25 MG PO TABS: 25.0000 mg | ORAL_TABLET | Freq: Every day | ORAL | Status: DC

## 2014-03-21 MED ORDER — OXYCODONE-ACETAMINOPHEN 5-325 MG PO TABS: 1.0000 | ORAL_TABLET | ORAL | Status: DC | PRN

## 2014-03-21 NOTE — ED Notes (Signed)
Pt is here with burning to mid upper abdomen and states that she has pain with just drinking water, so she tried beer and it just made it worse.  Pt also complains of back pain from the top of her spine down

## 2014-03-21 NOTE — Discharge Instructions (Signed)
Stop taking the lisinopril, to see if that improves your cough. Start taking the new blood pressure medication, today. This medicine can lower your potassium, make sure that you eat foods with potassium, such as bananas, and broccoli It is also important to stop smoking.   Abdominal Pain, Adult Many things can cause abdominal pain. Usually, abdominal pain is not caused by a disease and will improve without treatment. It can often be observed and treated at home. Your health care provider will do a physical exam and possibly order blood tests and X-rays to help determine the seriousness of your pain. However, in many cases, more time must pass before a clear cause of the pain can be found. Before that point, your health care provider may not know if you need more testing or further treatment. HOME CARE INSTRUCTIONS  Monitor your abdominal pain for any changes. The following actions may help to alleviate any discomfort you are experiencing:  Only take over-the-counter or prescription medicines as directed by your health care provider.  Do not take laxatives unless directed to do so by your health care provider.  Try a clear liquid diet (broth, tea, or water) as directed by your health care provider. Slowly move to a bland diet as tolerated. SEEK MEDICAL CARE IF:  You have unexplained abdominal pain.  You have abdominal pain associated with nausea or diarrhea.  You have pain when you urinate or have a bowel movement.  You experience abdominal pain that wakes you in the night.  You have abdominal pain that is worsened or improved by eating food.  You have abdominal pain that is worsened with eating fatty foods. SEEK IMMEDIATE MEDICAL CARE IF:   Your pain does not go away within 2 hours.  You have a fever.  You keep throwing up (vomiting).  Your pain is felt only in portions of the abdomen, such as the right side or the left lower portion of the abdomen.  You pass bloody or black  tarry stools. MAKE SURE YOU:  Understand these instructions.   Will watch your condition.   Will get help right away if you are not doing well or get worse.  Document Released: 07/18/2005 Document Revised: 07/29/2013 Document Reviewed: 06/17/2013 [facility] Patient Information 2014 [place].  Back Pain, Adult Low back pain is very common. About 1 in 5 people have back pain.The cause of low back pain is rarely dangerous. The pain often gets better over time.About half of people with a sudden onset of back pain feel better in just 2 weeks. About 8 in 10 people feel better by 6 weeks.  CAUSES Some common causes of back pain include:  Strain of the muscles or ligaments supporting the spine.  Wear and tear (degeneration) of the spinal discs.  Arthritis.  Direct injury to the back. DIAGNOSIS Most of the time, the direct cause of low back pain is not known.However, back pain can be treated effectively even when the exact cause of the pain is unknown.Answering your caregiver's questions about your overall health and symptoms is one of the most accurate ways to make sure the cause of your pain is not dangerous. If your caregiver needs more information, he or she may order lab work or imaging tests (X-rays or MRIs).However, even if imaging tests show changes in your back, this usually does not require surgery. HOME CARE INSTRUCTIONS For many people, back pain returns.Since low back pain is rarely dangerous, it is often a condition that people can learn  to [place] their own.   Remain active. It is stressful on the back to sit or stand in one place. Do not sit, drive, or stand in one place for more than 30 minutes at a time. Take short walks on level surfaces as soon as pain allows.Try to increase the length of time you walk each day.  Do not stay in bed.Resting more than 1 or 2 days can delay your recovery.  Do not avoid exercise or work.Your body is made to move.It is not  dangerous to be active, even though your back may hurt.Your back will likely heal faster if you return to being active before your pain is gone.  Pay attention to your body when you bend and lift. Many people have less discomfortwhen lifting if they bend their knees, keep the load close to their bodies,and avoid twisting. Often, the most comfortable positions are those that put less stress on your recovering back.  Find a comfortable position to sleep. Use a firm mattress and lie on your side with your knees slightly bent. If you lie on your back, put a pillow under your knees.  Only take over-the-counter or prescription medicines as directed by your caregiver. Over-the-counter medicines to reduce pain and inflammation are often the most helpful.Your caregiver may prescribe muscle relaxant drugs.These medicines help dull your pain so you can more quickly return to your normal activities and healthy exercise.  Put ice on the injured area.  Put ice in a plastic bag.  Place a towel between your skin and the bag.  Leave the ice on for 15-20 minutes, 03-04 times a day for the first 2 to 3 days. After that, ice and heat may be alternated to reduce pain and spasms.  Ask your caregiver about trying back exercises and gentle massage. This may be of some benefit.  Avoid feeling anxious or stressed.Stress increases muscle tension and can worsen back pain.It is important to recognize when you are anxious or stressed and learn ways to manage it.Exercise is a great option. SEEK MEDICAL CARE IF:  You have pain that is not relieved with rest or medicine.  You have pain that does not improve in 1 week.  You have new symptoms.  You are generally not feeling well. SEEK IMMEDIATE MEDICAL CARE IF:   You have pain that radiates from your back into your legs.  You develop new bowel or bladder control problems.  You have unusual weakness or numbness in your arms or legs.  You develop nausea or  vomiting.  You develop abdominal pain.  You feel faint. Document Released: 10/08/2005 Document Revised: 04/08/2012 Document Reviewed: 02/26/2011 [facility] Patient Information 2014 [place], [place].   Hypertension As your heart beats, it forces blood through your arteries. This force is your blood pressure. If the pressure is too high, it is called hypertension (HTN) or high blood pressure. HTN is dangerous because you may have it and not know it. High blood pressure may mean that your heart has to work harder to pump blood. Your arteries may be narrow or stiff. The extra work puts you at risk for heart disease, stroke, and other problems.  Blood pressure consists of two numbers, a higher number over a lower, 110/72, for example. It is stated as "110 over 72." The ideal is below 120 for the top number (systolic) and under 80 for the bottom (diastolic). Write down your blood pressure today. You should pay close attention to your blood pressure if you have certain  conditions such as:  Heart failure.  Prior heart attack.  Diabetes  Chronic kidney disease.  Prior stroke.  Multiple risk factors for heart disease. To see if you have HTN, your blood pressure should be measured while you are seated with your arm held at the level of the heart. It should be measured at least twice. A one-time elevated blood pressure reading (especially in the Emergency Department) does not mean that you need treatment. There may be conditions in which the blood pressure is different between your right and left arms. It is important to see your caregiver soon for a recheck. Most people have essential hypertension which means that there is not a specific cause. This type of high blood pressure may be lowered by changing lifestyle factors such as:  Stress.  Smoking.  Lack of exercise.  Excessive weight.  Drug/tobacco/alcohol use.  Eating less salt. Most people do not have symptoms from high blood pressure  until it has caused damage to the body. Effective treatment can often prevent, delay or reduce that damage. TREATMENT  When a cause has been identified, treatment for high blood pressure is directed at the cause. There are a large number of medications to treat HTN. These fall into several categories, and your caregiver will help you select the medicines that are best for you. Medications may have side effects. You should review side effects with your caregiver. If your blood pressure stays high after you have made lifestyle changes or started on medicines,   Your medication(s) may need to be changed.  Other problems may need to be addressed.  Be certain you understand your prescriptions, and know how and when to take your medicine.  Be sure to follow up with your caregiver within the time frame advised (usually within two weeks) to have your blood pressure rechecked and to review your medications.  If you are taking more than one medicine to lower your blood pressure, make sure you know how and at what times they should be taken. Taking two medicines at the same time can result in blood pressure that is too low. SEEK IMMEDIATE MEDICAL CARE IF:  You develop a severe headache, blurred or changing vision, or confusion.  You have unusual weakness or numbness, or a faint feeling.  You have severe chest or abdominal pain, vomiting, or breathing problems. MAKE SURE YOU:   Understand these instructions.  Will watch your condition.  Will get help right away if you are not doing well or get worse. Document Released: 10/08/2005 Document Revised: 12/31/2011 Document Reviewed: 05/28/2008 [facility] Patient Information 2014 [place].   Nicotine Addiction Nicotine can act as both a stimulant (excites/activates) and a sedative (calms/quiets). Immediately after exposure to nicotine, there is a "kick" caused in part by the drug's stimulation of the adrenal glands and resulting discharge of  adrenaline (epinephrine). The rush of adrenaline stimulates the body and causes a sudden release of sugar. This means that smokers are always slightly hyperglycemic. Hyperglycemic means that the blood sugar is high, just like in diabetics. Nicotine also decreases the amount of insulin which helps control sugar levels in the body. There is an increase in blood pressure, breathing, and the rate of heart beats.  In addition, nicotine indirectly causes a release of dopamine in the brain that controls pleasure and motivation. A similar reaction is seen with other drugs of abuse, such as cocaine and heroin. This dopamine release is thought to cause the pleasurable sensations when smoking. In some different cases,  nicotine can also create a calming effect, depending on sensitivity of the smoker's nervous system and the dose of nicotine taken. WHAT HAPPENS WHEN NICOTINE IS TAKEN FOR LONG PERIODS OF TIME?  Long-term use of nicotine results in addiction. It is difficult to stop.  Repeated use of nicotine creates tolerance. Higher doses of nicotine are needed to get the "kick." When nicotine use is stopped, withdrawal may last a month or more. Withdrawal may begin within a few hours after the last cigarette. Symptoms peak within the first few days and may lessen within a few weeks. For some people, however, symptoms may last for months or longer. Withdrawal symptoms include:   Irritability.  Craving.  Learning and attention deficits.  Sleep disturbances.  Increased appetite. Craving for tobacco may last for 6 months or longer. Many behaviors done while using nicotine can also play a part in the severity of withdrawal symptoms. For some people, the feel, smell, and sight of a cigarette and the ritual of obtaining, handling, lighting, and smoking the cigarette are closely linked with the pleasure of smoking. When stopped, they also miss the related behaviors which make the withdrawal or craving worse. While  nicotine gum and patches may lessen the drug aspects of withdrawal, cravings often persist. WHAT ARE THE MEDICAL CONSEQUENCES OF NICOTINE USE?  Nicotine addiction accounts for one-third of all cancers. The top cancer caused by tobacco is lung cancer. Lung cancer is the number one cancer killer of both men and women.  Smoking is also associated with cancers of the:  Mouth.  Pharynx.  Larynx.  Esophagus.  Stomach.  Pancreas.  Cervix.  Kidney.  Ureter.  Bladder.  Smoking also causes lung diseases such as lasting (chronic) bronchitis and emphysema.  It worsens asthma in adults and children.  Smoking increases the risk of heart disease, including:  Stroke.  Heart attack.  Vascular disease.  Aneurysm.  Passive or secondary smoke can also increase medical risks including:  Asthma in children.  Sudden Infant Death Syndrome (SIDS).  Additionally, dropped cigarettes are the leading cause of residential fire fatalities.  Nicotine poisoning has been reported from accidental ingestion of tobacco products by children and pets. Death usually results in a few minutes from respiratory failure (when a person stops breathing) caused by paralysis. TREATMENT   Medication. Nicotine replacement medicines such as nicotine gum and the patch are used to stop smoking. These medicines gradually lower the dosage of nicotine in the body. These medicines do not contain the carbon monoxide and other toxins found in tobacco smoke.  Hypnotherapy.  Relaxation therapy.  Nicotine Anonymous (a 12-step support program). Find times and locations in your local yellow pages. Document Released: 06/13/2004 Document Revised: 12/31/2011 Document Reviewed: 11/05/2007 [facility] Patient Information 2014 [place].  Smoking Cessation Quitting smoking is important to your health and has many advantages. However, it is not always easy to quit since nicotine is a very addictive drug. Often times,  people try 3 times or more before being able to quit. This document explains the best ways for you to prepare to quit smoking. Quitting takes hard work and a lot of effort, but you can do it. ADVANTAGES OF QUITTING SMOKING  You will live longer, feel better, and live better.  Your body will feel the impact of quitting smoking almost immediately.  Within 20 minutes, blood pressure decreases. Your pulse returns to its normal level.  After 8 hours, carbon monoxide levels in the blood return to normal. Your oxygen level increases.  After 24 hours, the chance of having a heart attack starts to decrease. Your breath, hair, and body stop smelling like smoke.  After 48 hours, damaged nerve endings begin to recover. Your sense of taste and smell improve.  After 72 hours, the body is virtually free of nicotine. Your bronchial tubes relax and breathing becomes easier.  After 2 to 12 weeks, lungs can hold more air. Exercise becomes easier and circulation improves.  The risk of having a heart attack, stroke, cancer, or lung disease is greatly reduced.  After 1 year, the risk of coronary heart disease is cut in half.  After 5 years, the risk of stroke falls to the same as a nonsmoker.  After 10 years, the risk of lung cancer is cut in half and the risk of other cancers decreases significantly.  After 15 years, the risk of coronary heart disease drops, usually to the level of a nonsmoker.  If you are pregnant, quitting smoking will improve your chances of having a healthy baby.  The people you live with, especially any children, will be healthier.  You will have extra money to spend on things other than cigarettes. QUESTIONS TO THINK ABOUT BEFORE ATTEMPTING TO QUIT You may want to talk about your answers with your caregiver.  Why do you want to quit?  If you tried to quit in the past, what helped and what did not?  What will be the most difficult situations for you after you quit? How will  you plan to handle them?  Who can help you through the tough times? Your family? Friends? A caregiver?  What pleasures do you get from smoking? What ways can you still get pleasure if you quit? Here are some questions to ask your caregiver:  How can you help me to be successful at quitting?  What medicine do you think would be best for me and how should I take it?  What should I do if I need more help?  What is smoking withdrawal like? How can I get information on withdrawal? GET READY  Set a quit date.  Change your environment by getting rid of all cigarettes, ashtrays, matches, and lighters in your home, car, or work. Do not let people smoke in your home.  Review your past attempts to quit. Think about what worked and what did not. GET SUPPORT AND ENCOURAGEMENT You have a better chance of being successful if you have help. You can get support in many ways.  Tell your family, friends, and co-workers that you are going to quit and need their support. Ask them not to smoke around you.  Get individual, group, or telephone counseling and support. Programs are available at [employer] and health centers. Call your local health department for information about programs in your area.  Spiritual beliefs and practices may help some smokers quit.  Download a "quit meter" on your computer to keep track of quit statistics, such as how long you have gone without smoking, cigarettes not smoked, and money saved.  Get a self-help book about quitting smoking and staying off of tobacco. [place] yourself from urges to smoke. Talk to someone, go for a walk, or occupy your time with a task.  Change your normal routine. Take a different route to work. Drink tea instead of coffee. Eat breakfast in a different place.  Reduce your stress. Take a hot bath, exercise, or read a book.  Plan something enjoyable to do every  day. Reward yourself for not  smoking.  Explore interactive web-based programs that specialize in helping you quit. GET MEDICINE AND USE IT CORRECTLY Medicines can help you stop smoking and decrease the urge to smoke. Combining medicine with the above behavioral methods and support can greatly increase your chances of successfully quitting smoking.  Nicotine replacement therapy helps deliver nicotine to your body without the negative effects and risks of smoking. Nicotine replacement therapy includes nicotine gum, lozenges, inhalers, nasal sprays, and skin patches. Some may be available over-the-counter and others require a prescription.  Antidepressant medicine helps people abstain from smoking, but how this works is unknown. This medicine is available by prescription.  Nicotinic receptor partial agonist medicine simulates the effect of nicotine in your brain. This medicine is available by prescription. Ask your caregiver for advice about which medicines to use and how to use them based on your health history. Your caregiver will tell you what side effects to look out for if you choose to be on a medicine or therapy. Carefully read the information on the package. Do not use any other product containing nicotine while using a nicotine replacement product.  RELAPSE OR DIFFICULT SITUATIONS Most relapses occur within the first 3 months after quitting. Do not be discouraged if you start smoking again. Remember, most people try several times before finally quitting. You may have symptoms of withdrawal because your body is used to nicotine. You may crave cigarettes, be irritable, feel very hungry, cough often, get headaches, or have difficulty concentrating. The withdrawal symptoms are only temporary. They are strongest when you first quit, but they will go away within 10 14 days. To reduce the chances of relapse, try to:  Avoid drinking alcohol. Drinking lowers your chances of successfully quitting.  Reduce the amount of caffeine  you consume. Once you quit smoking, the amount of caffeine in your body increases and can give you symptoms, such as a rapid heartbeat, sweating, and anxiety.  Avoid smokers because they can make you want to smoke.  Do not let weight gain distract you. Many smokers will gain weight when they quit, usually less than 10 pounds. Eat a healthy diet and stay active. You can always lose the weight gained after you quit.  Find ways to improve your mood other than smoking. FOR MORE INFORMATION  www.smokefree.gov  Document Released: 10/02/2001 Document Revised: 04/08/2012 Document Reviewed: 01/17/2012 [facility] Patient Information 2014 [place], [place].

## 2014-03-21 NOTE — ED Provider Notes (Addendum)
CSN: [id number]     Arrival date & time 03/21/14  1046 History   First MD Initiated Contact with Patient 03/21/14 1123     Chief Complaint  Patient presents with  . Back Pain     (Consider location/radiation/quality/duration/timing/severity/associated sxs/prior Treatment) Patient is a 48 y.o. female presenting with back pain. The history is provided by the patient.  Back Pain  She complains of mid back pain for 1 month. That is constant and prevents her from sleeping. She also has upper abdominal pain that is worsening over the last several days and is aggravated by drinking water or beer. She has a chronic cough, which she relates to her blood pressure medicine. She denies sputum production, fever, or chills, nausea, vomiting, weakness, dizziness, dysuria, constipation, or trouble walking. She is a cigarette smoker. She is taking her medication, as directed. There are no other known modifying factors.   Past Medical History  Diagnosis Date  . Sickle cell trait   . Hypertension   . Asthma   . Depression   . Obesity    Past Surgical History  Procedure Laterality Date  . No past surgeries     Family History  Problem Relation Age of Onset  . Other Neg Hx    History  Substance Use Topics  . Smoking status: Current Every Day Smoker -- 1.00 packs/day    Types: Cigarettes  . Smokeless tobacco: Never Used  . Alcohol Use: Yes     Comment: occ   OB History   Grav Para Term Preterm Abortions TAB SAB Ect Mult Living   8 5  5 1 1    4      Review of Systems  Musculoskeletal: Positive for back pain.  All other systems reviewed and are negative.     Allergies  Bee venom and Shellfish allergy  Home Medications   Prior to Admission medications   Medication Sig Start Date End Date Taking? Authorizing Provider  acetaminophen (TYLENOL) 500 MG tablet Take 1,000 mg by mouth every 6 (six) hours as needed for mild pain.   Yes Historical Provider, MD  albuterol (PROVENTIL  HFA;VENTOLIN HFA) 108 (90 BASE) MCG/ACT inhaler Inhale 2 puffs into the lungs every 4 (four) hours as needed for wheezing or shortness of breath.   Yes Historical Provider, MD  albuterol (PROVENTIL) (2.5 MG/3ML) 0.083% nebulizer solution Take 2.5 mg by nebulization every 4 (four) hours as needed for wheezing or shortness of breath.    Yes Historical Provider, MD  aspirin-acetaminophen-caffeine (EXCEDRIN EXTRA STRENGTH) [phone] MG per tablet Take 2-3 tablets by mouth every 4 (four) hours as needed for headache (For pain).   Yes Historical Provider, MD  Esomeprazole Magnesium (NEXIUM PO) Take 22.3 mg by mouth daily as needed (for acid reflux).   Yes Historical Provider, MD  lisinopril (PRINIVIL,ZESTRIL) 10 MG tablet Take 10 mg by mouth 2 (two) times daily.   Yes Historical Provider, MD  oxyCODONE-acetaminophen (PERCOCET) 5-325 MG per tablet Take 1 tablet by mouth every 4 (four) hours as needed for severe pain. 03/21/14   Richarda Blade, MD  pantoprazole (PROTONIX) 20 MG tablet Take 1 tablet (20 mg total) by mouth daily. 03/21/14   Richarda Blade, MD   BP 130/87  Pulse 92  Temp(Src) 98.1 F (36.7 C) (Oral)  Resp 18  SpO2 100% Physical Exam  Nursing note and vitals reviewed. Constitutional: She is oriented to person, place, and time. She appears well-developed. No distress.  Overweight  HENT:  Head:  Normocephalic and atraumatic.  Eyes: Conjunctivae and EOM are normal. Pupils are equal, round, and reactive to light.  Neck: Normal range of motion and phonation normal. Neck supple.  Cardiovascular: Normal rate, regular rhythm and intact distal pulses.   Pulmonary/Chest: Effort normal and breath sounds normal. She exhibits no tenderness.  Abdominal: Soft. She exhibits no distension and no mass. Tenderness: Epigastric, mild. There is no rebound and no guarding.  Musculoskeletal: Normal range of motion. She exhibits no edema.  No localized back tenderness or deformity.  Neurological: She is alert  and oriented to person, place, and time. She exhibits normal muscle tone.  Skin: Skin is warm and dry.  Psychiatric: She has a normal mood and affect. Her behavior is normal. Judgment and thought content normal.    ED Course  Procedures (including critical care time) Medications  gi cocktail (Maalox,Lidocaine,Donnatal) (30 mLs Oral Given 03/21/14 1142)  pantoprazole (PROTONIX) EC tablet 40 mg (40 mg Oral Given 03/21/14 1142)  oxyCODONE-acetaminophen (PERCOCET/ROXICET) 5-325 MG per tablet 1 tablet (1 tablet Oral Given 03/21/14 1223)    Patient Vitals for the past 24 hrs:  BP Temp Temp src Pulse Resp SpO2  03/21/14 1245 130/87 mmHg - - 92 - 100 %  03/21/14 1215 119/75 mmHg - - 68 - 99 %  03/21/14 1200 123/79 mmHg - - - - -  03/21/14 1057 115/94 mmHg 98.1 F (36.7 C) Oral 91 18 94 %    1:20 PM Reevaluation with update and discussion. After initial assessment and treatment, an updated evaluation reveals she is somewhat better, she required narcotic analgesia. Findings discussed with patient, all questions answered. Richarda Blade    Labs Review Labs Reviewed  COMPREHENSIVE METABOLIC PANEL - Abnormal; Notable for the following:    Glucose, Bld 102 (*)    Creatinine, Ser 1.22 (*)    Albumin 3.1 (*)    Total Bilirubin <0.2 (*)    GFR calc non Af Amer 52 (*)    GFR calc Af Amer 61 (*)    All other components within normal limits  URINALYSIS, ROUTINE W REFLEX MICROSCOPIC - Abnormal; Notable for the following:    Hgb urine dipstick MODERATE (*)    Protein, ur 100 (*)    All other components within normal limits  CBC WITH DIFFERENTIAL  LIPASE, BLOOD  URINE MICROSCOPIC-ADD ON  I-STAT TROPOININ, ED    Imaging Review No results found.   EKG Interpretation   Date/Time:  Sunday Mar 21 2014 10:54:44 EDT Ventricular Rate:  86 PR Interval:  128 QRS Duration: 76 QT Interval:  390 QTC Calculation: 466 R Axis:   27 Text Interpretation:  Normal sinus rhythm Normal ECG since last  tracing no  significant change Confirmed by Eulis Foster  MD, Vira Agar ([id number]) on 03/21/2014  12:30:12 PM      MDM   Final diagnoses:  Abdominal pain  Back pain    Ongoing back and abdominal pain, with signs and symptoms of peptic ulcer disease, and possible GERD. She is hemodynamically stable. Doubt pancreatitis, urinary tract infection, colitis or impending vascular collapse. She has an incidental chronic cough that may be associated with an ACE inhibitor. We'll change her to hydrochlorothiazide for now.  Nursing Notes Reviewed/ Care Coordinated Applicable Imaging Reviewed Interpretation of Laboratory Data incorporated into ED treatment  The patient appears reasonably screened and/or stabilized for discharge and I doubt any other medical condition or other [facility] requiring further screening, evaluation, or treatment in the ED at this time prior to  discharge.  Plan: Home Medications- Protonix, Percocet, HCTZ; Home Treatments- stop smoking; return here if the recommended treatment, does not improve the symptoms; Recommended follow up- PCP, next week, as scheduled.     Richarda Blade, MD 03/21/14 [place], MD 03/22/14 [phone]

## 2014-05-16 ENCOUNTER — Observation Stay (HOSPITAL_COMMUNITY)
Admission: EM | Admit: 2014-05-16 | Discharge: 2014-05-19 | Disposition: A | Discharge status: Home / Self Care | Payer: Self-pay | Attending: Internal Medicine | Admitting: Internal Medicine

## 2014-05-16 ENCOUNTER — Emergency Department (HOSPITAL_COMMUNITY): Payer: Self-pay

## 2014-05-16 ENCOUNTER — Encounter (HOSPITAL_COMMUNITY): Payer: Self-pay | Admitting: Emergency Medicine

## 2014-05-16 DIAGNOSIS — I10 Essential (primary) hypertension: Secondary | ICD-10-CM | POA: Insufficient documentation

## 2014-05-16 DIAGNOSIS — I639 Cerebral infarction, unspecified: Secondary | ICD-10-CM | POA: Diagnosis present

## 2014-05-16 DIAGNOSIS — R209 Unspecified disturbances of skin sensation: Secondary | ICD-10-CM | POA: Insufficient documentation

## 2014-05-16 DIAGNOSIS — T148 Other injury of unspecified body region: Secondary | ICD-10-CM

## 2014-05-16 DIAGNOSIS — I6330 Cerebral infarction due to thrombosis of unspecified cerebral artery: Secondary | ICD-10-CM

## 2014-05-16 DIAGNOSIS — F17200 Nicotine dependence, unspecified, uncomplicated: Secondary | ICD-10-CM | POA: Diagnosis present

## 2014-05-16 DIAGNOSIS — E669 Obesity, unspecified: Secondary | ICD-10-CM | POA: Insufficient documentation

## 2014-05-16 DIAGNOSIS — F3289 Other specified depressive episodes: Secondary | ICD-10-CM | POA: Insufficient documentation

## 2014-05-16 DIAGNOSIS — D573 Sickle-cell trait: Secondary | ICD-10-CM | POA: Insufficient documentation

## 2014-05-16 DIAGNOSIS — R5381 Other malaise: Secondary | ICD-10-CM | POA: Insufficient documentation

## 2014-05-16 DIAGNOSIS — R5383 Other fatigue: Secondary | ICD-10-CM

## 2014-05-16 DIAGNOSIS — J45909 Unspecified asthma, uncomplicated: Secondary | ICD-10-CM | POA: Diagnosis present

## 2014-05-16 DIAGNOSIS — R0789 Other chest pain: Principal | ICD-10-CM | POA: Diagnosis present

## 2014-05-16 DIAGNOSIS — R079 Chest pain, unspecified: Secondary | ICD-10-CM | POA: Insufficient documentation

## 2014-05-16 DIAGNOSIS — J4520 Mild intermittent asthma, uncomplicated: Secondary | ICD-10-CM

## 2014-05-16 DIAGNOSIS — F329 Major depressive disorder, single episode, unspecified: Secondary | ICD-10-CM | POA: Insufficient documentation

## 2014-05-16 DIAGNOSIS — R531 Weakness: Secondary | ICD-10-CM

## 2014-05-16 DIAGNOSIS — R0602 Shortness of breath: Secondary | ICD-10-CM | POA: Insufficient documentation

## 2014-05-16 DIAGNOSIS — W57XXXA Bitten or stung by nonvenomous insect and other nonvenomous arthropods, initial encounter: Secondary | ICD-10-CM | POA: Diagnosis present

## 2014-05-16 DIAGNOSIS — Z79899 Other long term (current) drug therapy: Secondary | ICD-10-CM | POA: Insufficient documentation

## 2014-05-16 DIAGNOSIS — F1410 Cocaine abuse, uncomplicated: Secondary | ICD-10-CM | POA: Diagnosis present

## 2014-05-16 LAB — COMPREHENSIVE METABOLIC PANEL
ALK PHOS: 84 U/L (ref 39–117)
ALT: 10 U/L (ref 0–35)
AST: 13 U/L (ref 0–37)
Albumin: 3 g/dL — ABNORMAL LOW (ref 3.5–5.2)
Anion gap: 15 (ref 5–15)
BUN: 9 mg/dL (ref 6–23)
CHLORIDE: 105 meq/L (ref 96–112)
CO2: 20 meq/L (ref 19–32)
Calcium: 8.5 mg/dL (ref 8.4–10.5)
Creatinine, Ser: 0.96 mg/dL (ref 0.50–1.10)
GFR, EST AFRICAN AMERICAN: 81 mL/min — AB (ref >90)
GFR, EST NON AFRICAN AMERICAN: 70 mL/min — AB (ref >90)
GLUCOSE: 89 mg/dL (ref 70–99)
POTASSIUM: 4 meq/L (ref 3.7–5.3)
SODIUM: 140 meq/L (ref 137–147)
Total Bilirubin: 0.3 mg/dL (ref 0.3–1.2)
Total Protein: 6.5 g/dL (ref 6.0–8.3)

## 2014-05-16 LAB — PRO B NATRIURETIC PEPTIDE: Pro B Natriuretic peptide (BNP): 156.4 pg/mL — ABNORMAL HIGH (ref 0–125)

## 2014-05-16 LAB — PROTIME-INR
INR: 1.05 (ref 0.00–1.49)
PROTHROMBIN TIME: 13.7 s (ref 11.6–15.2)

## 2014-05-16 LAB — CBC WITH DIFFERENTIAL/PLATELET
Basophils Absolute: 0 10*3/uL (ref 0.0–0.1)
Basophils Relative: 0 % (ref 0–1)
EOS PCT: 4 % (ref 0–5)
Eosinophils Absolute: 0.3 10*3/uL (ref 0.0–0.7)
HEMATOCRIT: 43.7 % (ref 36.0–46.0)
HEMOGLOBIN: 14.5 g/dL (ref 12.0–15.0)
LYMPHS ABS: 2.7 10*3/uL (ref 0.7–4.0)
LYMPHS PCT: 38 % (ref 12–46)
MCH: 28 pg (ref 26.0–34.0)
MCHC: 33.2 g/dL (ref 30.0–36.0)
MCV: 84.5 fL (ref 78.0–100.0)
MONO ABS: 0.7 10*3/uL (ref 0.1–1.0)
MONOS PCT: 11 % (ref 3–12)
NEUTROS ABS: 3.3 10*3/uL (ref 1.7–7.7)
Neutrophils Relative %: 47 % (ref 43–77)
Platelets: 229 10*3/uL (ref 150–400)
RBC: 5.17 MIL/uL — ABNORMAL HIGH (ref 3.87–5.11)
RDW: 15.4 % (ref 11.5–15.5)
WBC: 7 10*3/uL (ref 4.0–10.5)

## 2014-05-16 LAB — RAPID URINE DRUG SCREEN, HOSP PERFORMED
Amphetamines: NOT DETECTED
BARBITURATES: NOT DETECTED
Benzodiazepines: NOT DETECTED
Cocaine: POSITIVE — AB
OPIATES: POSITIVE — AB
Tetrahydrocannabinol: NOT DETECTED

## 2014-05-16 LAB — TROPONIN I
Troponin I: <0.3 ng/mL (ref <0.30)
Troponin I: <0.3 ng/mL (ref <0.30)

## 2014-05-16 LAB — APTT: aPTT: 25 seconds (ref 24–37)

## 2014-05-16 LAB — ACETAMINOPHEN LEVEL

## 2014-05-16 LAB — SALICYLATE LEVEL: Salicylate Lvl: <2 mg/dL — ABNORMAL LOW (ref 2.8–20.0)

## 2014-05-16 LAB — ETHANOL: Alcohol, Ethyl (B): <11 mg/dL (ref 0–11)

## 2014-05-16 MED ORDER — LISINOPRIL 40 MG PO TABS
40.0000 mg | ORAL_TABLET | Freq: Two times a day (BID) | ORAL | Status: DC
  Administered 2014-05-17 – 2014-05-19 (×5): 40 mg via ORAL
  Filled 2014-05-16 (×6): qty 1

## 2014-05-16 MED ORDER — LORATADINE 10 MG PO TABS
10.0000 mg | ORAL_TABLET | Freq: Every day | ORAL | Status: DC
  Administered 2014-05-16 – 2014-05-19 (×4): 10 mg via ORAL
  Filled 2014-05-16 (×4): qty 1

## 2014-05-16 MED ORDER — ONDANSETRON HCL 4 MG/2ML IJ SOLN
4.0000 mg | Freq: Once | INTRAMUSCULAR | Status: AC
  Administered 2014-05-16: 4 mg via INTRAVENOUS
  Filled 2014-05-16: qty 2

## 2014-05-16 MED ORDER — STROKE: EARLY STAGES OF RECOVERY BOOK
Freq: Once | Status: DC
  Filled 2014-05-16: qty 1

## 2014-05-16 MED ORDER — DIPHENHYDRAMINE HCL 25 MG PO CAPS
25.0000 mg | ORAL_CAPSULE | Freq: Three times a day (TID) | ORAL | Status: DC | PRN
Start: 2014-05-16 — End: 2014-05-19
  Administered 2014-05-16: 25 mg via ORAL
  Filled 2014-05-16: qty 1

## 2014-05-16 MED ORDER — TRAMADOL HCL 50 MG PO TABS
50.0000 mg | ORAL_TABLET | Freq: Four times a day (QID) | ORAL | Status: DC | PRN
  Administered 2014-05-16 – 2014-05-19 (×7): 50 mg via ORAL
  Filled 2014-05-16 (×7): qty 1

## 2014-05-16 MED ORDER — ALBUTEROL SULFATE (2.5 MG/3ML) 0.083% IN NEBU: 2.5000 mg | INHALATION_SOLUTION | RESPIRATORY_TRACT | Status: DC | PRN

## 2014-05-16 MED ORDER — SODIUM CHLORIDE 0.9 % IV BOLUS (SEPSIS)
1000.0000 mL | Freq: Once | INTRAVENOUS | Status: AC
  Administered 2014-05-16: 1000 mL via INTRAVENOUS

## 2014-05-16 MED ORDER — ASPIRIN 325 MG PO TABS
325.0000 mg | ORAL_TABLET | Freq: Every day | ORAL | Status: DC
  Administered 2014-05-16 – 2014-05-19 (×4): 325 mg via ORAL
  Filled 2014-05-16 (×4): qty 1

## 2014-05-16 MED ORDER — SENNOSIDES-DOCUSATE SODIUM 8.6-50 MG PO TABS
1.0000 | ORAL_TABLET | Freq: Every evening | ORAL | Status: DC | PRN
  Filled 2014-05-16: qty 1

## 2014-05-16 MED ORDER — ALBUTEROL SULFATE HFA 108 (90 BASE) MCG/ACT IN AERS: 2.0000 | INHALATION_SPRAY | RESPIRATORY_TRACT | Status: DC | PRN

## 2014-05-16 MED ORDER — ENOXAPARIN SODIUM 40 MG/0.4ML ~~LOC~~ SOLN
40.0000 mg | SUBCUTANEOUS | Status: DC
  Administered 2014-05-16 – 2014-05-18 (×3): 40 mg via SUBCUTANEOUS
  Filled 2014-05-16 (×4): qty 0.4

## 2014-05-16 MED ORDER — MORPHINE SULFATE 4 MG/ML IJ SOLN
4.0000 mg | Freq: Once | INTRAMUSCULAR | Status: AC
  Administered 2014-05-16: 4 mg via INTRAVENOUS
  Filled 2014-05-16: qty 1

## 2014-05-16 NOTE — ED Provider Notes (Signed)
Medical screening examination/treatment/procedure(s) were conducted as a shared visit with non-physician practitioner(s) and myself.  I personally evaluated the patient during the encounter.  Difficult historian.  3 days of L sided chest pain, constant, worse with palpation. Nurse reports patient using too much APAP, but patient states no more than 8 500 mg tabs per day.. APAP level neg, LFTs normal. Decreased grip strength on R, weakness in R arm and R leg.  Questionable slurred speech. Chest pain atypical for ACS. Needs stroke rule out.   EKG Interpretation   Date/Time:  Sunday May 16 2014 10:39:58 EDT Ventricular Rate:  94 PR Interval:  128 QRS Duration: 78 QT Interval:  366 QTC Calculation: 457 R Axis:   -13 Text Interpretation:  Normal sinus rhythm Septal infarct , age  undetermined Abnormal ECG No significant change was found Confirmed by  Wyvonnia Dusky  MD, Taysen Bushart [phone]) on 05/16/2014 10:47:29 AM       Ezequiel Essex, MD 05/16/14 1900

## 2014-05-16 NOTE — ED Notes (Signed)
hospitalist at bedside to eval pt

## 2014-05-16 NOTE — ED Notes (Signed)
Pt had bed on 3E, calling flow manager to change to neuro unit.

## 2014-05-16 NOTE — ED Notes (Signed)
Pt has multiple complaints. Reports having fevers, headaches, back pain, chest pain and sob. Has been taking tylenol too frequently, unsure of dosage or frequency but reports going through approx 24 pills in past two days.

## 2014-05-16 NOTE — ED Provider Notes (Signed)
CSN: [id number]     Arrival date & time 05/16/14  1037 History   First MD Initiated Contact with Patient 05/16/14 1044     Chief Complaint  Patient presents with  . Chest Pain  . Shortness of Breath     (Consider location/radiation/quality/duration/timing/severity/associated sxs/prior Treatment) HPI Comments: Patient is a 48 yo F PMHx significant for sickle cell trait, HTN, Asthma, Depression, cocaine use presenting to the ED for multiple complaints. Patient's first complaint is three day history of constant chest pressure with associated shortness of breath. No alleviating or aggravating factors. Medications tried prior to arrival: Tylenol 8 tablets extra strength a day. Patient is also complaining of right sided arm and leg weakness and tingling that began three days ago as well. No injuries. No alleviating or aggravating factors. PERC negative.     Past Medical History  Diagnosis Date  . Sickle cell trait   . Hypertension   . Asthma   . Depression   . Obesity    Past Surgical History  Procedure Laterality Date  . No past surgeries     Family History  Problem Relation Age of Onset  . Other Neg Hx    History  Substance Use Topics  . Smoking status: Current Every Day Smoker -- 1.00 packs/day    Types: Cigarettes  . Smokeless tobacco: Never Used  . Alcohol Use: Yes     Comment: occ   OB History   Grav Para Term Preterm Abortions TAB SAB Ect Mult Living   8 5  5 1 1    4      Review of Systems  Constitutional: Negative for fever and chills.  Respiratory: Positive for shortness of breath.   Cardiovascular: Positive for chest pain.  Gastrointestinal: Negative for nausea, vomiting, abdominal pain and diarrhea.  Neurological: Positive for weakness.  All other systems reviewed and are negative.     Allergies  Bee venom and Shellfish allergy  Home Medications   Prior to Admission medications   Medication Sig Start Date End Date Taking? Authorizing Provider   acetaminophen (TYLENOL) 500 MG tablet Take 1,000 mg by mouth every 6 (six) hours as needed for mild pain.   Yes Historical Provider, MD  lisinopril (PRINIVIL,ZESTRIL) 20 MG tablet Take 40 mg by mouth 2 (two) times daily.   Yes Historical Provider, MD  albuterol (PROVENTIL HFA;VENTOLIN HFA) 108 (90 BASE) MCG/ACT inhaler Inhale 2 puffs into the lungs every 4 (four) hours as needed for wheezing or shortness of breath.    Historical Provider, MD   BP 122/79  Pulse 92  Temp(Src) 97.4 F (36.3 C) (Oral)  Resp 22  SpO2 100%  LMP 05/09/2014 Physical Exam  Nursing note and vitals reviewed. Constitutional: She is oriented to person, place, and time. She appears well-developed and well-nourished. No distress.  HENT:  Head: Normocephalic and atraumatic.  Right Ear: External ear normal.  Left Ear: External ear normal.  Nose: Nose normal.  Mouth/Throat: Oropharynx is clear and moist. No oropharyngeal exudate.  Eyes: Conjunctivae and EOM are normal. Pupils are equal, round, and reactive to light.  Neck: Normal range of motion. Neck supple.  Cardiovascular: Normal rate, regular rhythm, normal heart sounds and intact distal pulses.   Pulmonary/Chest: Effort normal and breath sounds normal. No respiratory distress. She exhibits tenderness.  Abdominal: Soft. There is no tenderness.  Musculoskeletal:  MAE x 4  Neurological: She is alert and oriented to person, place, and time. No cranial nerve deficit. Gait normal. GCS eye subscore  is 4. GCS verbal subscore is 5. GCS motor subscore is 6.  Mildly decreased sensation to R arm and leg.  RUE and RLE 4/5 strength compared to 5/5 on left side.   Skin: Skin is warm and dry. She is not diaphoretic.    ED Course  Procedures (including critical care time) Medications  sodium chloride 0.9 % bolus 1,000 mL (0 mLs Intravenous Stopped 05/16/14 1430)  ondansetron (ZOFRAN) injection 4 mg (4 mg Intravenous Given 05/16/14 1205)  morphine 4 MG/ML injection 4 mg (4 mg  Intravenous Given 05/16/14 1207)    Labs Review Labs Reviewed  CBC WITH DIFFERENTIAL - Abnormal; Notable for the following:    RBC 5.17 (*)    All other components within normal limits  PRO B NATRIURETIC PEPTIDE - Abnormal; Notable for the following:    Pro B Natriuretic peptide (BNP) 156.4 (*)    All other components within normal limits  COMPREHENSIVE METABOLIC PANEL - Abnormal; Notable for the following:    Albumin 3.0 (*)    GFR calc non Af Amer 70 (*)    GFR calc Af Amer 81 (*)    All other components within normal limits  SALICYLATE LEVEL - Abnormal; Notable for the following:    Salicylate Lvl <9.9 (*)    All other components within normal limits  TROPONIN I  ACETAMINOPHEN LEVEL  URINE RAPID DRUG SCREEN (HOSP PERFORMED)  ETHANOL  PROTIME-INR  APTT  TROPONIN I    Imaging Review Dg Chest 2 View  05/16/2014   CLINICAL DATA:  Chest pressure, left shoulder pain, shortness of breath  EXAM: CHEST  2 VIEW  COMPARISON:  01/13/2014  FINDINGS: Stable medial left apical lucency compatible with bullous formation. Normal heart size and vascularity. Lungs remain otherwise clear. No focal pneumonia, collapse or consolidation. No effusion, edema or pneumothorax. Trachea midline.  IMPRESSION: Left apical bullous disease.  Stable exam without superimposed acute process.   Electronically Signed   By: Daryll Brod M.D.   On: 05/16/2014 11:22   Ct Head Wo Contrast  05/16/2014   CLINICAL DATA:  R sided weakness SHORTNESS OF BREATH  EXAM: CT HEAD WITHOUT CONTRAST  TECHNIQUE: Contiguous axial images were obtained from the base of the skull through the vertex without intravenous contrast.  COMPARISON:  02/08/2011  FINDINGS: There is no evidence of acute intracranial hemorrhage, brain edema, mass lesion, acute infarction, mass effect, or midline shift. Acute infarct may be inapparent on noncontrast CT. No other intra-axial abnormalities are seen, and the ventricles and sulci are within normal limits in  size and symmetry. No abnormal extra-axial fluid collections or masses are identified. No significant calvarial abnormality.  IMPRESSION: 1. Negative for bleed or other acute intracranial process.   Electronically Signed   By: Arne Cleveland M.D.   On: 05/16/2014 14:18     EKG Interpretation   Date/Time:  Sunday May 16 2014 10:39:58 EDT Ventricular Rate:  94 PR Interval:  128 QRS Duration: 78 QT Interval:  366 QTC Calculation: 457 R Axis:   -13 Text Interpretation:  Normal sinus rhythm Septal infarct , age  undetermined Abnormal ECG No significant change was found Confirmed by  Wyvonnia Dusky  MD, STEPHEN [phone]) on 05/16/2014 10:47:29 AM      MDM   Final diagnoses:  Chest pain, unspecified chest pain type    Filed Vitals:   05/16/14 1300  BP:   Pulse:   Temp: 97.4 F (36.3 C)  Resp:     Afebrile, NAD, non-toxic  appearing, AAOx4.   Patient with right sided weakness > 6 hours onset. No other neurofocal deficits. CT head negative. Dr. Nicole Kindred consulted who recommends in patient admission for further evaluation of CVA.  CP: 3 day history of reproducible chest pain. Regular rate and rhythm. Lungs clear to auscultation. Chest wall is tender to palpation. Patient with no evidence of respiratory distress. Delta troponin will be obtained. EKG unremarkable. CXR unremarkable. First troponin negative. Will monitor further inpatient.   Patient d/w with Dr. Wyvonnia Dusky, agrees with plan.     Harlow Mares, PA-C 05/16/14 1557

## 2014-05-16 NOTE — ED Notes (Signed)
Patient transported to X-ray 

## 2014-05-16 NOTE — ED Notes (Signed)
Attempted report to 3W and RN unable to take report

## 2014-05-16 NOTE — ED Notes (Signed)
Attempted to call report, RN unable to take it at this time.

## 2014-05-16 NOTE — ED Notes (Signed)
Patient transported to CT 

## 2014-05-16 NOTE — ED Notes (Signed)
Pt. Stated, I'm having chest pain , it feels like I hafved a ton of bricks sitting on my chest.  Im also SOB.  This all started on Friday. I have an appt at the [facility] on [address]. 26.

## 2014-05-16 NOTE — Consult Note (Signed)
Referring Physician: Dr. Wyvonnia Dusky    Chief Complaint: Weakness involving right side and slurred speech.  HPI: Taylor Vazquez is an 48 y.o. female with a history of sickle cell trait, hypertension, asthma, depression and obesity. Patient also indicated she had a myocardial infarction about 3 years ago. She's presenting today with complaint of reduced movement in weakness involving her right side for 3 days. Family indicates that her speech has become slightly slurred. She's had no difficulty with swallowing. No facial weakness his been noted. CT scan of her head was obtained earlier today which showed no acute intracranial abnormality. Patient has not been compliant with taking aspirin daily as previously recommended. NIH stroke score was 4.  LSN: 05/13/2014 tPA Given: No: Beyond time under for treatment consideration MRankin: 1  Past Medical History  Diagnosis Date  . Sickle cell trait   . Hypertension   . Asthma   . Depression   . Obesity     Family History  Problem Relation Age of Onset  . Other Neg Hx      Medications: I have reviewed the patient's current medications.  ROS: History obtained from the patient  General ROS: negative for - chills, fatigue, fever, night sweats, weight gain or weight loss Psychological ROS: negative for - behavioral disorder, hallucinations, memory difficulties, mood swings or suicidal ideation Ophthalmic ROS: negative for - blurry vision, double vision, eye pain or loss of vision ENT ROS: negative for - epistaxis, nasal discharge, oral lesions, sore throat, tinnitus or vertigo Allergy and Immunology ROS: negative for - hives or itchy/watery eyes Hematological and Lymphatic ROS: negative for - bleeding problems, bruising or swollen lymph nodes Endocrine ROS: negative for - galactorrhea, hair pattern changes, polydipsia/polyuria or temperature intolerance Respiratory ROS: negative for - cough, hemoptysis, shortness of breath or  wheezing Cardiovascular ROS: Positive for chest pain for 3 days, not relieved with antacids and Prilosec Gastrointestinal ROS: negative for - abdominal pain, diarrhea, hematemesis, nausea/vomiting or stool incontinence Genito-Urinary ROS: negative for - dysuria, hematuria, incontinence or urinary frequency/urgency Musculoskeletal ROS: negative for - joint swelling or muscular weakness Neurological ROS: as noted in HPI Dermatological ROS: negative for rash and skin lesion changes  Physical Examination: Blood pressure 122/79, pulse 92, temperature 97.4 F (36.3 C), temperature source Oral, resp. rate 22, last menstrual period 05/09/2014, SpO2 100.00%.  Neurologic Examination: Mental Status: Alert, oriented, thought content appropriate.  Speech fluent without evidence of aphasia. Able to follow commands without difficulty. Cranial Nerves: II-Visual fields were normal. III/IV/VI-Pupils were equal and reacted. Extraocular movements were full and conjugate.    V/VII-no facial numbness and no facial weakness. VIII-normal. X-equivocal dysarthria. Motor: Drift of right upper and lower extremities; reduced grip strength on the right. Sensory: Reduced perception of tactile sensation involving right lower extremity compared to left lower extremity. Deep Tendon Reflexes: 2+ and symmetric. Plantars: Flexor bilaterally.  Dg Chest 2 View  05/16/2014   CLINICAL DATA:  Chest pressure, left shoulder pain, shortness of breath  EXAM: CHEST  2 VIEW  COMPARISON:  01/13/2014  FINDINGS: Stable medial left apical lucency compatible with bullous formation. Normal heart size and vascularity. Lungs remain otherwise clear. No focal pneumonia, collapse or consolidation. No effusion, edema or pneumothorax. Trachea midline.  IMPRESSION: Left apical bullous disease.  Stable exam without superimposed acute process.   Electronically Signed   By: Daryll Brod M.D.   On: 05/16/2014 11:22   Ct Head Wo Contrast  05/16/2014    CLINICAL DATA:  R sided weakness  SHORTNESS OF BREATH  EXAM: CT HEAD WITHOUT CONTRAST  TECHNIQUE: Contiguous axial images were obtained from the base of the skull through the vertex without intravenous contrast.  COMPARISON:  02/08/2011  FINDINGS: There is no evidence of acute intracranial hemorrhage, brain edema, mass lesion, acute infarction, mass effect, or midline shift. Acute infarct may be inapparent on noncontrast CT. No other intra-axial abnormalities are seen, and the ventricles and sulci are within normal limits in size and symmetry. No abnormal extra-axial fluid collections or masses are identified. No significant calvarial abnormality.  IMPRESSION: 1. Negative for bleed or other acute intracranial process.   Electronically Signed   By: Arne Cleveland M.D.   On: 05/16/2014 14:18    Assessment: 48 y.o. female with multiple risk factors for stroke presenting with possible acute left subcortical MCA territory ischemic stroke.  Stroke Risk Factors - family history and hypertension  Plan: 1. HgbA1c, fasting lipid panel 2. MRI, MRA  of the brain without contrast 3. PT consult, OT consult, Speech consult 4. Echocardiogram 5. Carotid dopplers 6. Prophylactic therapy-Antiplatelet med: Aspirin 325 mg per day 7. Risk factor modification 8. Telemetry monitoring   C.R. Nicole Kindred, MD [facility] [phone]  05/16/2014, 3:36 PM

## 2014-05-16 NOTE — H&P (Signed)
[facility] History and Physical  Taylor Vazquez WRU:[id number] DOB: Mar 12, 1966 DOA: 05/16/2014  Referring physician: EDP PCP: Default, Provider, MD   Chief Complaint: chest discomfort and right sided weakness since three days.   HPI: Taylor Vazquez is a 48 y.o. female with prior h/o hypertension, asthma, comes in for rigth sided weakness and chest discomfort since Friday. She comes in today for not feeling well, subjective fevers and chills. She reports tingling and numbness of the right lower and upper extremity. She denies any headache, dizziness, or palpitations. She reports chest discomfort, is substernal and tender to palpation. Reports sob on exertion, which is apparently the baseline for her,. No orthopnea or PND or pedal edema.  On arrival to ED, she underwent a CT head without contrast for evaluation of stroke. Neurology was consulted. She was referred to [facility] service for admission.   Review of Systems:  Constitutional:  Reports fever and chills.  HEENT:  No headaches, Difficulty swallowing,Tooth/dental problems,Sore throat,  No sneezing, itching, ear ache, nasal congestion, post nasal drip,  Cardio-vascular:  Reports chest discomfort substernal.  GI:  No heartburn, indigestion, abdominal pain, nausea, vomiting, diarrhea, change in bowel habits, loss of appetite  Resp:  No shortness of breath with exertion or at rest. No excess mucus, no productive cough, No non-productive cough, No coughing up of blood.No change in color of mucus.No wheezing.chest wall tenderness on palpation. Skin:  Bug bites over her upper extremities and abdomen.   GU:  no dysuria, change in color of urine, no urgency or frequency. No flank pain.  Musculoskeletal:  No joint pain or swelling. No decreased range of motion. No back pain.    Past Medical History  Diagnosis Date  . Sickle cell trait   . Hypertension   . Asthma   . Depression   . Obesity    Past Surgical History    Procedure Laterality Date  . No past surgeries     Social History:  reports that she has been smoking Cigarettes.  She has been smoking about 1.00 pack per day. She has never used smokeless tobacco. She reports that she drinks alcohol. She reports that she uses illicit drugs.  Allergies  Allergen Reactions  . Bee Venom Anaphylaxis  . Shellfish Allergy Anaphylaxis and Swelling    Family History  Problem Relation Age of Onset  . Other Neg Hx      Prior to Admission medications   Medication Sig Start Date End Date Taking? Authorizing Provider  acetaminophen (TYLENOL) 500 MG tablet Take 1,000 mg by mouth every 6 (six) hours as needed for mild pain.   Yes Historical Provider, MD  lisinopril (PRINIVIL,ZESTRIL) 20 MG tablet Take 40 mg by mouth 2 (two) times daily.   Yes Historical Provider, MD  albuterol (PROVENTIL HFA;VENTOLIN HFA) 108 (90 BASE) MCG/ACT inhaler Inhale 2 puffs into the lungs every 4 (four) hours as needed for wheezing or shortness of breath.    Historical Provider, MD   Physical Exam: Filed Vitals:   05/16/14 1530 05/16/14 1625 05/16/14 1627 05/16/14 1755  BP: 131/85 139/67 139/67 157/86  Pulse: 86 70  80  Temp:   97.6 F (36.4 C) 98.3 F (36.8 C)  TempSrc:   Oral Oral  Resp: 22 22 16 17   Height:    5\' 3"  (1.6 m)  Weight:    108.863 kg (240 lb)  SpO2: 99% 100% 100% 94%    Wt Readings from Last 3 Encounters:  05/16/14 108.863 kg (240 lb)  08/17/13 100.245 kg (221 lb)  04/26/12 99.61 kg (219 lb 9.6 oz)    General:  Appears calm and comfortable Eyes: PERRL, normal lids, irises & conjunctiva Neck: no LAD, masses or thyromegaly Cardiovascular: RRR, no m/r/g. No LE edema. Respiratory: CTA bilaterally, no w/r/r. Normal respiratory effort. Abdomen: soft, ntnd Skin: no rash or induration seen on limited exam Musculoskeletal: grossly normal tone BUE/BLE. Neurologic: Alert and oriented to person, place and time, speech normal. Per patient she is slurring  occasionally. No facial droop. Decreased sensation on the right side of the body, including, her arms and legs and face. Strength normal bilaterally.           Labs on Admission:  Basic Metabolic Panel:  Recent Labs Lab 05/16/14 1152  NA 140  K 4.0  CL 105  CO2 20  GLUCOSE 89  BUN 9  CREATININE 0.96  CALCIUM 8.5   Liver Function Tests:  Recent Labs Lab 05/16/14 1152  AST 13  ALT 10  ALKPHOS 84  BILITOT 0.3  PROT 6.5  ALBUMIN 3.0*   No results found for this basename: LIPASE, AMYLASE,  in the last 168 hours No results found for this basename: AMMONIA,  in the last 168 hours CBC:  Recent Labs Lab 05/16/14 1152  WBC 7.0  NEUTROABS 3.3  HGB 14.5  HCT 43.7  MCV 84.5  PLT 229   Cardiac Enzymes:  Recent Labs Lab 05/16/14 1152 05/16/14 1622  TROPONINI <0.30 <0.30    BNP (last 3 results)  Recent Labs  05/16/14 1152  PROBNP 156.4*   CBG: No results found for this basename: GLUCAP,  in the last 168 hours  Radiological Exams on Admission: Dg Chest 2 View  05/16/2014   CLINICAL DATA:  Chest pressure, left shoulder pain, shortness of breath  EXAM: CHEST  2 VIEW  COMPARISON:  01/13/2014  FINDINGS: Stable medial left apical lucency compatible with bullous formation. Normal heart size and vascularity. Lungs remain otherwise clear. No focal pneumonia, collapse or consolidation. No effusion, edema or pneumothorax. Trachea midline.  IMPRESSION: Left apical bullous disease.  Stable exam without superimposed acute process.   Electronically Signed   By: Daryll Brod M.D.   On: 05/16/2014 11:22   Ct Head Wo Contrast  05/16/2014   CLINICAL DATA:  R sided weakness SHORTNESS OF BREATH  EXAM: CT HEAD WITHOUT CONTRAST  TECHNIQUE: Contiguous axial images were obtained from the base of the skull through the vertex without intravenous contrast.  COMPARISON:  02/08/2011  FINDINGS: There is no evidence of acute intracranial hemorrhage, brain edema, mass lesion, acute infarction,  mass effect, or midline shift. Acute infarct may be inapparent on noncontrast CT. No other intra-axial abnormalities are seen, and the ventricles and sulci are within normal limits in size and symmetry. No abnormal extra-axial fluid collections or masses are identified. No significant calvarial abnormality.  IMPRESSION: 1. Negative for bleed or other acute intracranial process.   Electronically Signed   By: Arne Cleveland M.D.   On: 05/16/2014 14:18    EKG: NSR at 95/min  Assessment/Plan Active Problems:   Right sided weakness   Asthma, chronic   Chest discomfort   Bug bite   RIGHT sided weakness: Admitted for evaluation of stroke.  MRI, MRA of the head and neck ordered.  Carotid duplex and echocardiogram ordered.  PT eval.  Aspirin 325 mg daily.  Neurology on board.    Chest discomfort: Tender to palpation, very atypical.  But will r/o ACS. Get serial  troponins and echocardiogram.    Asthma: controlled. Resume albuterol.   Hypertension: permissive hypertension.      Code Status: full code.  DVT Prophylaxis: sq lovenox Family Communication: family atbedside Disposition Plan: admitted to telemetry  Time spent: 76  Minutes.   [facility] Pager [phone]  **Disclaimer: This note may have been dictated with voice recognition software. Similar sounding words can inadvertently be transcribed and this note may contain transcription errors which may not have been corrected upon publication of note.**

## 2014-05-17 ENCOUNTER — Observation Stay (HOSPITAL_COMMUNITY): Payer: Self-pay

## 2014-05-17 DIAGNOSIS — R0789 Other chest pain: Secondary | ICD-10-CM | POA: Diagnosis present

## 2014-05-17 DIAGNOSIS — I10 Essential (primary) hypertension: Secondary | ICD-10-CM

## 2014-05-17 DIAGNOSIS — F17200 Nicotine dependence, unspecified, uncomplicated: Secondary | ICD-10-CM | POA: Diagnosis present

## 2014-05-17 DIAGNOSIS — M6281 Muscle weakness (generalized): Secondary | ICD-10-CM

## 2014-05-17 DIAGNOSIS — R071 Chest pain on breathing: Secondary | ICD-10-CM

## 2014-05-17 DIAGNOSIS — I6330 Cerebral infarction due to thrombosis of unspecified cerebral artery: Secondary | ICD-10-CM

## 2014-05-17 DIAGNOSIS — I517 Cardiomegaly: Secondary | ICD-10-CM

## 2014-05-17 LAB — LIPID PANEL
CHOLESTEROL: 154 mg/dL (ref 0–200)
HDL: 32 mg/dL — ABNORMAL LOW (ref >39)
LDL Cholesterol: 95 mg/dL (ref 0–99)
TRIGLYCERIDES: 135 mg/dL (ref <150)
Total CHOL/HDL Ratio: 4.8 RATIO
VLDL: 27 mg/dL (ref 0–40)

## 2014-05-17 LAB — TROPONIN I: Troponin I: <0.3 ng/mL (ref <0.30)

## 2014-05-17 LAB — HEMOGLOBIN A1C
Hgb A1c MFr Bld: 5.9 % — ABNORMAL HIGH (ref <5.7)
Mean Plasma Glucose: 123 mg/dL — ABNORMAL HIGH (ref <117)

## 2014-05-17 MED ORDER — SIMVASTATIN 20 MG PO TABS
20.0000 mg | ORAL_TABLET | Freq: Every day | ORAL | Status: DC
  Administered 2014-05-17 – 2014-05-18 (×2): 20 mg via ORAL
  Filled 2014-05-17 (×3): qty 1

## 2014-05-17 MED ORDER — HYDROCHLOROTHIAZIDE 12.5 MG PO CAPS
12.5000 mg | ORAL_CAPSULE | Freq: Every day | ORAL | Status: DC
  Administered 2014-05-17 – 2014-05-19 (×3): 12.5 mg via ORAL
  Filled 2014-05-17 (×3): qty 1

## 2014-05-17 MED ORDER — LORAZEPAM 2 MG/ML IJ SOLN
1.0000 mg | Freq: Once | INTRAMUSCULAR | Status: AC
  Administered 2014-05-17: 1 mg via INTRAVENOUS
  Filled 2014-05-17: qty 1

## 2014-05-17 NOTE — Progress Notes (Signed)
Utilization review completed.  

## 2014-05-17 NOTE — Progress Notes (Signed)
RN called stating patient's brother needed note for visiting his sister (the patient) because he lives at [facility].   Note written and given to brother stating: Mr. Dan Humphreys visited his sister at [facility] on 05/17/14 from 7:30 pm to 9:30 pm.   Signed,  Clance Boll, NP [facility]

## 2014-05-17 NOTE — Evaluation (Signed)
Physical Therapy Evaluation Patient Details Name: Taylor Vazquez MRN: [id number] DOB: 1966-06-25 Today's Date: 05/17/2014   History of Present Illness  Patient is a 48 y/o female admitted with chest discomfort and right sided weakness, numbness, slurred speech and blurred vision for 3 days admitted for possible CVA. Head CT (-). NIH-3. PMH positive for HTN, asthma, depression, sickle cell trait and obesity.   Clinical Impression  Patient presents with functional limitations due to deficits listed in PT problem list below. Pt presents with right hemiparesis however inconsistencies noted during formal testing vs actual observation of functional mobility. Pt reports "going through some stressful stuff at home - had to bury a long time family friend recently, worried about mother, kids etc." Pt high falls risk due to weakness in RLE. Pt would benefit from skilled PT to maximize independence and improve safe mobility so pt can safely discharge to below venue. May need ST rehab pending improvement however pt reports she has 24/7 supervision/assist at home.   Follow Up Recommendations Home health PT;Supervision/Assistance - 24 hour (pending improvement.)    Equipment Recommendations  Rolling walker with 5" wheels    Recommendations for Other Services       Precautions / Restrictions Precautions Precautions: Fall Restrictions Weight Bearing Restrictions: No      Mobility  Bed Mobility Overal bed mobility: Needs Assistance Bed Mobility: Supine to Sit;Sit to Supine     Supine to sit: Supervision;HOB elevated Sit to supine: Supervision;HOB elevated   General bed mobility comments: No difficulty getting to EOB. Complaints of dizziness reported. BP 153/84  Transfers Overall transfer level: Needs assistance Equipment used: Rolling walker (2 wheeled) Transfers: Sit to/from Stand Sit to Stand: Min guard         General transfer comment: Stood from EOB  x1.  Ambulation/Gait Ambulation/Gait assistance: Min assist Ambulation Distance (Feet): 20 Feet Assistive device: Rolling walker (2 wheeled) Gait Pattern/deviations: Decreased stance time - right;Step-to pattern;Decreased step length - left;Decreased stride length;Trunk flexed   Gait velocity interpretation: Below normal speed for age/gender    Stairs            Wheelchair Mobility    Modified Rankin (Stroke Patients Only) Modified Rankin (Stroke Patients Only) Pre-Morbid Rankin Score: No symptoms Modified Rankin: Moderately severe disability     Balance Overall balance assessment: Needs assistance   Sitting balance-Leahy Scale: Fair Sitting balance - Comments: Able to reach down and fix socks using RUE prior to assessment.   Standing balance support: Bilateral upper extremity supported;During functional activity Standing balance-Leahy Scale: Poor Standing balance comment: Use of RW for support. Unsteady.                             Pertinent Vitals/Pain Reports "dull pressure" in chest area. Does not change with exercise/activity. Dizziness reported while sitting EOB for >10 minutes, resolved. Rn made aware.     Home Living Family/patient expects to be discharged to:: Private residence Living Arrangements: Non-relatives/Friends Available Help at Discharge: Friend(s) Type of Home: House Home Access: Level entry     Home Layout: One level Home Equipment: None      Prior Function Level of Independence: Independent               Hand Dominance   Dominant Hand: Right    Extremity/Trunk Assessment   Upper Extremity Assessment: RUE deficits/detail;LUE deficits/detail RUE Deficits / Details: Decreased AROM shoulder elevation to 90 degrees, PROM increases to 135  degrees with pain in shoulder, AROM elbow flexion/ext WFL, wrist/digits WFL. Decreased grip strength. Inconsistencies noted during formal testing vs observed mobility during therapy  session of RUE. Able to functionally utilize RUE to pull covers over self and push down through walker handle during mobility.   RUE Sensation: decreased light touch LUE Deficits / Details: AROM WFL throughout all joints - shoulder, elbow, wrist, digits. Strength WFL.   Lower Extremity Assessment: RLE deficits/detail;LLE deficits/detail RLE Deficits / Details: Grossly ~2+/5 knee extension, 2+/5 knee flexion, 2+/5 hip flexion, 2+/5 ankle DF/PF during formal MMT, however inconsistencies noted as pt able to stand and ambulate with use of RW with partial right knee buckling. On performing MMT second occasion, strength getting progressively weaker.  LLE Deficits / Details: AROM and strength WFL.     Communication   Communication: No difficulties  Cognition Arousal/Alertness: Awake/alert Behavior During Therapy: WFL for tasks assessed/performed Overall Cognitive Status: Within Functional Limits for tasks assessed                      General Comments General comments (skin integrity, edema, etc.): Pt reports seeing "spots" and blurry vision in right eye. Able to read calendar and white board from across room without difficulty. Wears corrective lenses, not present in room.    Exercises        Assessment/Plan    PT Assessment Patient needs continued PT services  PT Diagnosis Generalized weakness;Abnormality of gait   PT Problem List Decreased strength;Decreased range of motion;Impaired sensation;Decreased activity tolerance;Decreased knowledge of use of DME;Decreased balance;Decreased safety awareness;Obesity;Decreased mobility;Decreased knowledge of precautions  PT Treatment Interventions DME instruction;Balance training;Gait training;Neuromuscular re-education;Functional mobility training;Patient/family education;Therapeutic activities;Therapeutic exercise   PT Goals (Current goals can be found in the Care Plan section) Acute Rehab PT Goals Patient Stated Goal: to get better and  go home. PT Goal Formulation: With patient Time For Goal Achievement: 05/31/14 Potential to Achieve Goals: Good    Frequency Min 4X/week   Barriers to discharge        Co-evaluation               End of Session Equipment Utilized During Treatment: Gait belt Activity Tolerance: Patient tolerated treatment well Patient left: in bed;with call bell/phone within reach;with bed alarm set;with nursing/sitter in room Nurse Communication: Mobility status;Precautions         Time: 1224-8250 PT Time Calculation (min): 27 min   Charges:   PT Evaluation $Initial PT Evaluation Tier I: 1 Procedure PT Treatments $Gait Training: 8-22 mins   PT G CodesCandy Sledge A 05/17/2014, 2:35 PM Candy Sledge, [place], [place]

## 2014-05-17 NOTE — Progress Notes (Signed)
Pt returned from to floor at 1900.  MRI technician called by this writer as to why test not performed. Reported pt remains claustrophobic despite Lorazepam 1 mg given at 1825; stated will  "try it tomorrow"

## 2014-05-17 NOTE — Progress Notes (Signed)
SLP Cancellation Note  Patient Details Name: [name] MRN: [id number] DOB: May 03, 1966   Cancelled treatment:       Reason Eval/Treat Not Completed: SLP screened, no needs identified, will sign off   Cliffie Gingras, Katherene Ponto 05/17/2014, 10:13 AM

## 2014-05-17 NOTE — Consult Note (Signed)
CONSULTATION NOTE  Reason for Consult: Chest pain  Requesting Physician: Dr. Algis Liming  Cardiologist: Dr. Sallyanne Kuster  HPI: This is a 48 y.o. female with a past medical history significant for asthma, sickle cell anemia and prior cocaine use. She presented to the [facility] in 2012 for chest pain and back pain as well as right flank pain. She had stated at the time that she saw a cardiology a cardiologist in the past and was told she had an MI and stated she was in the  hospital for 1 week. However, no records were found to validate this.  At the time she was seen she stated she was  6-[redacted] weeks pregnant and that she was supposed to see the obstetrician on Friday. It was not a planned pregnancy.  Her chest pain was felt to be noncardiac.  A urine pregnancy test was negative.  An outpatient followup was recommended, but no record of follow-up exists.   She now presents for right-sided weakness, slurred speech and chest pain. He chest pain is reportedly very tender to palpation and thought to be atypical. Troponin is negative x 5.   EKG shows normal sinus rhythm without ischemic changes. At this time she denies any prior history of MI.   PMHx:  Past Medical History  Diagnosis Date  . Sickle cell trait   . Hypertension   . Asthma   . Depression   . Obesity    Past Surgical History  Procedure Laterality Date  . No past surgeries      FAMHx: Family History  Problem Relation Age of Onset  . Other Neg Hx     SOCHx:  reports that she has been smoking Cigarettes.  She has been smoking about 1.00 pack per day. She has never used smokeless tobacco. She reports that she drinks alcohol. She reports that she uses illicit drugs.  ALLERGIES: Allergies  Allergen Reactions  . Bee Venom Anaphylaxis  . Shellfish Allergy Anaphylaxis and Swelling    ROS: A comprehensive review of systems was negative except for: Cardiovascular: positive for chest pain Neurological: positive for  speech problems and weakness  HOME MEDICATIONS: Prescriptions prior to admission  Medication Sig Dispense Refill  . acetaminophen (TYLENOL) 500 MG tablet Take 1,000 mg by mouth every 6 (six) hours as needed for mild pain.      Marland Kitchen lisinopril (PRINIVIL,ZESTRIL) 20 MG tablet Take 40 mg by mouth 2 (two) times daily.      Marland Kitchen albuterol (PROVENTIL HFA;VENTOLIN HFA) 108 (90 BASE) MCG/ACT inhaler Inhale 2 puffs into the lungs every 4 (four) hours as needed for wheezing or shortness of breath.        HOSPITAL MEDICATIONS: Prior to Admission:  Prescriptions prior to admission  Medication Sig Dispense Refill  . acetaminophen (TYLENOL) 500 MG tablet Take 1,000 mg by mouth every 6 (six) hours as needed for mild pain.      Marland Kitchen lisinopril (PRINIVIL,ZESTRIL) 20 MG tablet Take 40 mg by mouth 2 (two) times daily.      Marland Kitchen albuterol (PROVENTIL HFA;VENTOLIN HFA) 108 (90 BASE) MCG/ACT inhaler Inhale 2 puffs into the lungs every 4 (four) hours as needed for wheezing or shortness of breath.        VITALS: Blood pressure 149/89, pulse 85, temperature 98.3 F (36.8 Vazquez), temperature source Oral, resp. rate 16, height '5\' 3"'  (1.6 m), weight 240 lb (108.863 kg), last menstrual period 05/09/2014, SpO2 95.00%.  PHYSICAL EXAM: General appearance: alert, no distress and holding  right arm Neck: no carotid bruit and no JVD Lungs: clear to auscultation bilaterally Heart: regular rate and rhythm, S1, S2 normal, no murmur, click, rub or gallop Abdomen: soft, non-tender; bowel sounds normal; no masses,  no organomegaly and obese Extremities: extremities normal, atraumatic, no cyanosis or edema Pulses: 2+ and symmetric Skin: Skin color, texture, turgor normal. No rashes or lesions Neurologic: Mental status: Alert, oriented, thought content appropriate, strength 4/5 right arm and right leg, 5/5 on left Psych: Normal  LABS: Results for orders placed during the hospital encounter of 05/16/14 (from the past 48 hour(s))  CBC WITH  DIFFERENTIAL     Status: Abnormal   Collection Time    05/16/14 11:52 AM      Result Value Ref Range   WBC 7.0  4.0 - 10.5 K/uL   RBC 5.17 (*) 3.87 - 5.11 MIL/uL   Hemoglobin 14.5  12.0 - 15.0 g/dL   HCT 43.7  36.0 - 46.0 %   MCV 84.5  78.0 - 100.0 fL   MCH 28.0  26.0 - 34.0 pg   MCHC 33.2  30.0 - 36.0 g/dL   RDW 15.4  11.5 - 15.5 %   Platelets 229  150 - 400 K/uL   Neutrophils Relative % 47  43 - 77 %   Neutro Abs 3.3  1.7 - 7.7 K/uL   Lymphocytes Relative 38  12 - 46 %   Lymphs Abs 2.7  0.7 - 4.0 K/uL   Monocytes Relative 11  3 - 12 %   Monocytes Absolute 0.7  0.1 - 1.0 K/uL   Eosinophils Relative 4  0 - 5 %   Eosinophils Absolute 0.3  0.0 - 0.7 K/uL   Basophils Relative 0  0 - 1 %   Basophils Absolute 0.0  0.0 - 0.1 K/uL  PRO B NATRIURETIC PEPTIDE     Status: Abnormal   Collection Time    05/16/14 11:52 AM      Result Value Ref Range   Pro B Natriuretic peptide (BNP) 156.4 (*) 0 - 125 pg/mL  TROPONIN I     Status: None   Collection Time    05/16/14 11:52 AM      Result Value Ref Range   Troponin I <0.30  <0.30 ng/mL   Comment:            Due to the release kinetics of cTnI,     a negative result within the first hours     of the onset of symptoms does not rule out     myocardial infarction with certainty.     If myocardial infarction is still suspected,     repeat the test at appropriate intervals.  ACETAMINOPHEN LEVEL     Status: None   Collection Time    05/16/14 11:52 AM      Result Value Ref Range   Acetaminophen (Tylenol), Serum <15.0  10 - 30 ug/mL   Comment:            THERAPEUTIC CONCENTRATIONS VARY     SIGNIFICANTLY. A RANGE OF 10-30     ug/mL MAY BE AN EFFECTIVE     CONCENTRATION FOR MANY PATIENTS.     HOWEVER, SOME ARE BEST TREATED     AT CONCENTRATIONS OUTSIDE THIS     RANGE.     ACETAMINOPHEN CONCENTRATIONS     >150 ug/mL AT 4 HOURS AFTER     INGESTION AND >50 ug/mL AT 12     HOURS AFTER  INGESTION ARE     OFTEN ASSOCIATED WITH TOXIC      REACTIONS.  COMPREHENSIVE METABOLIC PANEL     Status: Abnormal   Collection Time    05/16/14 11:52 AM      Result Value Ref Range   Sodium 140  137 - 147 mEq/L   Potassium 4.0  3.7 - 5.3 mEq/L   Chloride 105  96 - 112 mEq/L   CO2 20  19 - 32 mEq/L   Glucose, Bld 89  70 - 99 mg/dL   BUN 9  6 - 23 mg/dL   Creatinine, Ser 0.96  0.50 - 1.10 mg/dL   Calcium 8.5  8.4 - 10.5 mg/dL   Total Protein 6.5  6.0 - 8.3 g/dL   Albumin 3.0 (*) 3.5 - 5.2 g/dL   AST 13  0 - 37 U/L   ALT 10  0 - 35 U/L   Alkaline Phosphatase 84  39 - 117 U/L   Total Bilirubin 0.3  0.3 - 1.2 mg/dL   GFR calc non Af Amer 70 (*) >90 mL/min   GFR calc Af Amer 81 (*) >90 mL/min   Comment: (NOTE)     The eGFR has been calculated using the CKD EPI equation.     This calculation has not been validated in all clinical situations.     eGFR's persistently <90 mL/min signify possible Chronic Kidney     Disease.   Anion gap 15  5 - 15  SALICYLATE LEVEL     Status: Abnormal   Collection Time    05/16/14 11:52 AM      Result Value Ref Range   Salicylate Lvl <6.8 (*) 2.8 - 20.0 mg/dL  URINE RAPID DRUG SCREEN (HOSP PERFORMED)     Status: Abnormal   Collection Time    05/16/14  3:56 PM      Result Value Ref Range   Opiates POSITIVE (*) NONE DETECTED   Cocaine POSITIVE (*) NONE DETECTED   Benzodiazepines NONE DETECTED  NONE DETECTED   Amphetamines NONE DETECTED  NONE DETECTED   Tetrahydrocannabinol NONE DETECTED  NONE DETECTED   Barbiturates NONE DETECTED  NONE DETECTED   Comment:            DRUG SCREEN FOR MEDICAL PURPOSES     ONLY.  IF CONFIRMATION IS NEEDED     FOR ANY PURPOSE, NOTIFY LAB     WITHIN 5 DAYS.                LOWEST DETECTABLE LIMITS     FOR URINE DRUG SCREEN     Drug Class       Cutoff (ng/mL)     Amphetamine      1000     Barbiturate      200     Benzodiazepine   616     Tricyclics       837     Opiates          300     Cocaine          300     THC              50  ETHANOL     Status: None    Collection Time    05/16/14  4:22 PM      Result Value Ref Range   Alcohol, Ethyl (B) <11  0 - 11 mg/dL   Comment:  LOWEST DETECTABLE LIMIT FOR     SERUM ALCOHOL IS 11 mg/dL     FOR MEDICAL PURPOSES ONLY  PROTIME-INR     Status: None   Collection Time    05/16/14  4:22 PM      Result Value Ref Range   Prothrombin Time 13.7  11.6 - 15.2 seconds   INR 1.05  0.00 - 1.49  APTT     Status: None   Collection Time    05/16/14  4:22 PM      Result Value Ref Range   aPTT 25  24 - 37 seconds  TROPONIN I     Status: None   Collection Time    05/16/14  4:22 PM      Result Value Ref Range   Troponin I <0.30  <0.30 ng/mL   Comment:            Due to the release kinetics of cTnI,     a negative result within the first hours     of the onset of symptoms does not rule out     myocardial infarction with certainty.     If myocardial infarction is still suspected,     repeat the test at appropriate intervals.  TROPONIN I     Status: None   Collection Time    05/16/14 10:15 PM      Result Value Ref Range   Troponin I <0.30  <0.30 ng/mL   Comment:            Due to the release kinetics of cTnI,     a negative result within the first hours     of the onset of symptoms does not rule out     myocardial infarction with certainty.     If myocardial infarction is still suspected,     repeat the test at appropriate intervals.  LIPID PANEL     Status: Abnormal   Collection Time    05/17/14  3:30 AM      Result Value Ref Range   Cholesterol 154  0 - 200 mg/dL   Triglycerides 135  <150 mg/dL   HDL 32 (*) >39 mg/dL   Total CHOL/HDL Ratio 4.8     VLDL 27  0 - 40 mg/dL   LDL Cholesterol 95  0 - 99 mg/dL   Comment:            Total Cholesterol/HDL:CHD Risk     Coronary Heart Disease Risk Table                         Men   Women      1/2 Average Risk   3.4   3.3      Average Risk       5.0   4.4      2 X Average Risk   9.6   7.1      3 X Average Risk  23.4   11.0                 Use the calculated Patient Ratio     above and the CHD Risk Table     to determine the patient's CHD Risk.                ATP III CLASSIFICATION (LDL):      <100     mg/dL   Optimal      100-129  mg/dL  Near or Above                        Optimal      130-159  mg/dL   Borderline      160-189  mg/dL   High      >190     mg/dL   Very High  TROPONIN I     Status: None   Collection Time    05/17/14  3:30 AM      Result Value Ref Range   Troponin I <0.30  <0.30 ng/mL   Comment:            Due to the release kinetics of cTnI,     a negative result within the first hours     of the onset of symptoms does not rule out     myocardial infarction with certainty.     If myocardial infarction is still suspected,     repeat the test at appropriate intervals.  TROPONIN I     Status: None   Collection Time    05/17/14  9:15 AM      Result Value Ref Range   Troponin I <0.30  <0.30 ng/mL   Comment:            Due to the release kinetics of cTnI,     a negative result within the first hours     of the onset of symptoms does not rule out     myocardial infarction with certainty.     If myocardial infarction is still suspected,     repeat the test at appropriate intervals.    IMAGING: Dg Chest 2 View  05/16/2014   CLINICAL DATA:  Chest pressure, left shoulder pain, shortness of breath  EXAM: CHEST  2 VIEW  COMPARISON:  01/13/2014  FINDINGS: Stable medial left apical lucency compatible with bullous formation. Normal heart size and vascularity. Lungs remain otherwise clear. No focal pneumonia, collapse or consolidation. No effusion, edema or pneumothorax. Trachea midline.  IMPRESSION: Left apical bullous disease.  Stable exam without superimposed acute process.   Electronically Signed   By: Daryll Brod M.D.   On: 05/16/2014 11:22   Ct Head Wo Contrast  05/16/2014   CLINICAL DATA:  R sided weakness SHORTNESS OF BREATH  EXAM: CT HEAD WITHOUT CONTRAST  TECHNIQUE: Contiguous axial images were  obtained from the base of the skull through the vertex without intravenous contrast.  COMPARISON:  02/08/2011  FINDINGS: There is no evidence of acute intracranial hemorrhage, brain edema, mass lesion, acute infarction, mass effect, or midline shift. Acute infarct may be inapparent on noncontrast CT. No other intra-axial abnormalities are seen, and the ventricles and sulci are within normal limits in size and symmetry. No abnormal extra-axial fluid collections or masses are identified. No significant calvarial abnormality.  IMPRESSION: 1. Negative for bleed or other acute intracranial process.   Electronically Signed   By: Arne Cleveland M.D.   On: 05/16/2014 14:18    HOSPITAL DIAGNOSES: Active Problems:   Right sided weakness   Asthma, chronic   Chest discomfort   Bug bite   Chest wall pain   IMPRESSION: 1. Chest wall pain, atypical for angina  RECOMMENDATION: 1. Taylor Vazquez has atypical chest pain which is reproducible on palpation and located to a 2-3 inch area along the left lateral sternal border. She's had 5 troponins which are negative. She continues to complain of right-sided weakness although her CT scan  was negative. She has yet to undergo a head MRI.  I agree with an echocardiogram to look for possible cardiac causes of what appears to be subacute stroke. I suspect her chest wall pain is neuropathic and are musculoskeletal in nature. Other than the echocardiogram I would not recommended any other cardiac workup at this time, unless a TEE is felt to be helpful to evaluate for a cardiac cause of her stroke.  Thanks for consulting [place].  Time Spent Directly with Patient: 30 minutes  Pixie Casino, MD, [facility] Attending Cardiologist [facility]  Taylor Vazquez 05/17/2014, 11:32 AM

## 2014-05-17 NOTE — Evaluation (Signed)
Occupational Therapy Evaluation Patient Details Name: Taylor Vazquez MRN: [id number] DOB: October 15, 1966 Today's Date: 05/17/2014    History of Present Illness Patient is a 48 y/o female admitted with chest discomfort and right sided weakness, numbness, slurred speech and blurred vision for 3 days admitted for possible CVA. Head CT (-). NIH-3. PMH positive for HTN, asthma, depression, sickle cell trait and obesity.   Clinical Impression   Patient presents during OT evaluation with right sided weakness, and decreased active range of motion to formal testing, balance deficits, general deconditioning / decreased activity tolerance, and report of blurred vision which impede safe and independent perfomrance of basic self care skills.  Patient was independent with ADL/IADL - working part time, and babysitting 3 young grandchildren prior to this event.  Patient will benefit from skilled OT intervention to improve independence with ADL/IADL prior to discharge home with support from friend.     Follow Up Recommendations  Home health OT    Equipment Recommendations  3 in 1 bedside comode;Tub/shower bench    Recommendations for Other Services       Precautions / Restrictions Precautions Precautions: Fall Restrictions Weight Bearing Restrictions: No      Mobility Bed Mobility Overal bed mobility: Needs Assistance Bed Mobility: Supine to Sit;Sit to Supine     Supine to sit: HOB elevated;Supervision Sit to supine: Supervision;HOB elevated   General bed mobility comments: No difficulty getting to EOB. Complaints of dizziness reported. BP 145/87  Transfers Overall transfer level: Needs assistance Equipment used: Rolling walker (2 wheeled) Transfers: Sit to/from Stand Sit to Stand: Min assist         General transfer comment: Stood from EOB x1.    Balance Overall balance assessment: Needs assistance Sitting-balance support: No upper extremity supported;Feet supported Sitting  balance-Leahy Scale: Good Sitting balance - Comments: Able to reach down and fix socks using RUE prior to assessment.   Standing balance support: No upper extremity supported;During functional activity Standing balance-Leahy Scale: Poor Standing balance comment: Use of RW for support. Unsteady.                            ADL Overall ADL's : Needs assistance/impaired     Grooming: Wash/dry hands;Wash/dry face;Sitting;Set up   Upper Body Bathing: Set up;Sitting   Lower Body Bathing: Minimal assistance;Sit to/from stand   Upper Body Dressing : Set up;Sitting   Lower Body Dressing: Minimal assistance;Sit to/from stand   Toilet Transfer: Moderate assistance   Toileting- Clothing Manipulation and Hygiene: Moderate assistance;Sit to/from stand       Functional mobility during ADLs: Minimal assistance;Moderate assistance General ADL Comments: functional mobility level vascilates - reports decreased knee control     Vision                     Perception Perception Perception Tested?: No   Praxis Praxis Praxis tested?: Not tested    Pertinent Vitals/Pain Mild 2/10 right shoulder     Hand Dominance Right   Extremity/Trunk Assessment Upper Extremity Assessment Upper Extremity Assessment: RUE deficits/detail RUE Deficits / Details: Has full active assisted ROM in right shoulder, initially only able to flex to 90 degrees, howerver using functionally without difficulty.  Able to actively flex and extend shoulder in end ranges RUE Sensation: decreased light touch RUE Coordination: decreased fine motor;decreased gross motor LUE Deficits / Details: AROM WFL throughout all joints - shoulder, elbow, wrist, digits. Strength WFL.   Lower Extremity  Assessment Lower Extremity Assessment: Defer to PT evaluation RLE Deficits / Details: Grossly ~2+/5 knee extension, 2+/5 knee flexion, 2+/5 hip flexion, 2+/5 ankle DF/PF during formal MMT, however inconsistencies noted  as pt able to stand and ambulate with use of RW with partial right knee buckling. On performing MMT second occasion, strength getting progressively weaker.  RLE Sensation: decreased light touch RLE Coordination: decreased fine motor;decreased gross motor LLE Deficits / Details: AROM and strength WFL.   Cervical / Trunk Assessment Cervical / Trunk Assessment: Normal   Communication Communication Communication: No difficulties   Cognition Arousal/Alertness: Awake/alert Behavior During Therapy: WFL for tasks assessed/performed Overall Cognitive Status: Within Functional Limits for tasks assessed                     General Comments       Exercises       Shoulder Instructions      Home Living Family/patient expects to be discharged to:: Private residence Living Arrangements: Non-relatives/Friends Available Help at Discharge: Friend(s) Type of Home: House Home Access: Level entry     Home Layout: One level     Bathroom Shower/Tub: Tub/shower unit Shower/tub characteristics: Architectural technologist: Standard     Home Equipment: None   Additional Comments: plans to stay at friend's home at discharge      Prior Functioning/Environment Level of Independence: Independent             OT Diagnosis: Generalized weakness;Acute pain;Disturbance of vision   OT Problem List: Decreased strength;Impaired balance (sitting and/or standing);Pain;Decreased range of motion;Decreased safety awareness;Cardiopulmonary status limiting activity;Decreased activity tolerance;Decreased knowledge of use of DME or AE;Impaired UE functional use   OT Treatment/Interventions: Self-care/ADL training;DME and/or AE instruction;Therapeutic activities;Balance training;Therapeutic exercise;Visual/perceptual remediation/compensation;Patient/family education    OT Goals(Current goals can be found in the care plan section) Acute Rehab OT Goals Patient Stated Goal: to get better and go home. OT  Goal Formulation: With patient Time For Goal Achievement: 05/31/14 Potential to Achieve Goals: Good  OT Frequency: Min 2X/week   Barriers to D/C: Decreased caregiver support  plans to stay with friend for increased support at discharge       Co-evaluation              End of Session    Activity Tolerance: Patient tolerated treatment well Patient left: in bed;with call bell/phone within reach;with family/visitor present   Time: 6073-7106 OT Time Calculation (min): 23 min Charges:  OT General Charges $OT Visit: 1 Procedure OT Evaluation $Initial OT Evaluation Tier I: 1 Procedure OT Treatments $Self Care/Home Management : 8-22 mins G-Codes:    Mariah Milling 14-Jun-2014, 3:21 PM

## 2014-05-17 NOTE — Progress Notes (Signed)
Stroke Team Progress Note  HISTORY Taylor Vazquez is an 48 y.o. female with a history of sickle cell trait, hypertension, asthma, depression and obesity. Patient also indicated she had a myocardial infarction about 3 years ago. She's presenting today with complaint of reduced movement in weakness involving her right side for 3 days prior to presentation to the hospital.. Family indicates that her speech has become slightly slurred. She's had no difficulty with swallowing. No facial weakness his been noted. CT scan of her head was obtained earlier today which showed no acute intracranial abnormality. Patient has not been compliant with taking aspirin daily as previously recommended. NIH stroke score was 4.  LSN: 05/13/2014  tPA Given: No: Beyond time under for treatment consideration  MRankin: 1   . She was admitted to the neuro floor bed for further evaluation and treatment.  SUBJECTIVE Her  Family is not  at the bedside.  Overall she feels her condition is gradually improving. She still has right arm heaviness and weakness.  OBJECTIVE Most recent Vital Signs: Filed Vitals:   05/17/14 0700 05/17/14 1037 05/17/14 1207 05/17/14 1510  BP: 154/86 149/89 136/77 145/87  Pulse: 85  77   Temp: 98.3 F (36.8 C)  97.8 F (36.6 C)   TempSrc: Oral  Oral   Resp: 16  12   Height:      Weight:      SpO2: 95%  96%    CBG (last 3)  No results found for this basename: GLUCAP,  in the last 72 hours  IV Fluid Intake:     MEDICATIONS  .  stroke: mapping our early stages of recovery book   Does not apply Once  . aspirin  325 mg Oral Daily  . enoxaparin (LOVENOX) injection  40 mg Subcutaneous Q24H  . hydrochlorothiazide  12.5 mg Oral Daily  . lisinopril  40 mg Oral BID  . loratadine  10 mg Oral Daily  . simvastatin  20 mg Oral q1800   PRN:  albuterol, diphenhydrAMINE, senna-docusate, traMADol  Diet:  Cardiac   Activity:  Bedrest    DVT Prophylaxis:  SCDs  CLINICALLY SIGNIFICANT  STUDIES Basic Metabolic Panel:  Recent Labs Lab 05/16/14 1152  NA 140  K 4.0  CL 105  CO2 20  GLUCOSE 89  BUN 9  CREATININE 0.96  CALCIUM 8.5   Liver Function Tests:  Recent Labs Lab 05/16/14 1152  AST 13  ALT 10  ALKPHOS 84  BILITOT 0.3  PROT 6.5  ALBUMIN 3.0*   CBC:  Recent Labs Lab 05/16/14 1152  WBC 7.0  NEUTROABS 3.3  HGB 14.5  HCT 43.7  MCV 84.5  PLT 229   Coagulation:  Recent Labs Lab 05/16/14 1622  LABPROT 13.7  INR 1.05   Cardiac Enzymes:  Recent Labs Lab 05/16/14 2215 05/17/14 0330 05/17/14 0915  TROPONINI <0.30 <0.30 <0.30   Urinalysis: No results found for this basename: COLORURINE, APPERANCEUR, LABSPEC, PHURINE, GLUCOSEU, HGBUR, BILIRUBINUR, KETONESUR, PROTEINUR, UROBILINOGEN, NITRITE, LEUKOCYTESUR,  in the last 168 hours Lipid Panel    Component Value Date/Time   CHOL 154 05/17/2014 0330   TRIG 135 05/17/2014 0330   HDL 32* 05/17/2014 0330   CHOLHDL 4.8 05/17/2014 0330   VLDL 27 05/17/2014 0330   LDLCALC 95 05/17/2014 0330   HgbA1C  Lab Results  Component Value Date   HGBA1C 5.9* 05/17/2014    Urine Drug Screen:     Component Value Date/Time   LABOPIA POSITIVE* 05/16/2014 1556  COCAINSCRNUR POSITIVE* 05/16/2014 1556   LABBENZ NONE DETECTED 05/16/2014 1556   AMPHETMU NONE DETECTED 05/16/2014 1556   THCU NONE DETECTED 05/16/2014 1556   LABBARB NONE DETECTED 05/16/2014 1556    Alcohol Level:  Recent Labs Lab 05/16/14 1622  ETH <11    Dg Chest 2 View  05/16/2014   CLINICAL DATA:  Chest pressure, left shoulder pain, shortness of breath  EXAM: CHEST  2 VIEW  COMPARISON:  01/13/2014  FINDINGS: Stable medial left apical lucency compatible with bullous formation. Normal heart size and vascularity. Lungs remain otherwise clear. No focal pneumonia, collapse or consolidation. No effusion, edema or pneumothorax. Trachea midline.  IMPRESSION: Left apical bullous disease.  Stable exam without superimposed acute process.   Electronically Signed    By: Daryll Brod M.D.   On: 05/16/2014 11:22   Ct Head Wo Contrast  05/16/2014   CLINICAL DATA:  R sided weakness SHORTNESS OF BREATH  EXAM: CT HEAD WITHOUT CONTRAST  TECHNIQUE: Contiguous axial images were obtained from the base of the skull through the vertex without intravenous contrast.  COMPARISON:  02/08/2011  FINDINGS: There is no evidence of acute intracranial hemorrhage, brain edema, mass lesion, acute infarction, mass effect, or midline shift. Acute infarct may be inapparent on noncontrast CT. No other intra-axial abnormalities are seen, and the ventricles and sulci are within normal limits in size and symmetry. No abnormal extra-axial fluid collections or masses are identified. No significant calvarial abnormality.  IMPRESSION: 1. Negative for bleed or other acute intracranial process.   Electronically Signed   By: Arne Cleveland M.D.   On: 05/16/2014 14:18     MRI of the brain  ordered  MRA of the brain  orered  Carotid Doppler  pending  2D Echocardiogram  Results pending CXR  Left apical bullous disease.  Stable exam without superimposed acute process.    EKG  Normal sinus rhythm Septal infarct , age undetermined  Therapy Recommendations  pending Physical Exam   Middle aged african american aldy not in distress.Awake alert. Afebrile. Head is nontraumatic. Neck is supple without bruit. Hearing is normal. Cardiac exam no murmur or gallop. Lungs are clear to auscultation. Distal pulses are well felt. Neurological Exam : Awake alert oriented x 3 normal speech and language. Mild right lower face asymmetry. Tongue midline. No drift. Mild diminished fine finger movements on right. Orbits left over right upper extremity. Mild right grip weak.. Normal sensation . Normal coordination. ASSESSMENT Taylor Vazquez is a 48 y.o. female presenting with dysarthria and right sided weakness and chest pain.   .CT head negative and chest pain w/u negative for MI so far.  On no antiplatelets  prior to admission. Now on Aspirin 325 mg for secondary stroke prevention. Patient with resultant mild subjective right sided weakness. Stroke work up underway.   Cocaine positive  LDL 95 mg % Not on statins PTA  Hypertension on Lisinopril PTA   Hospital day # 1  TREATMENT/PLAN  Continue   aspirin 325 mg orally every day for secondary stroke prevention.  Patient counselled to  Quit cocaine and be complaint with her HT medicines.   I have personally examined this patient, reviewed notes, independently viewed imaging studies, participated in medical decision making and plan of care.  Antony Contras, MD Medical Director [facility] Pager: [phone] 05/17/2014 4:02 PM  SIGNED    To contact Stroke Continuity provider, please refer to [email or web address] After hours, contact General Neurology

## 2014-05-17 NOTE — Progress Notes (Signed)
*  PRELIMINARY RESULTS* Echocardiogram 2D Echocardiogram has been performed.  Leavy Cella 05/17/2014, 3:06 PM

## 2014-05-17 NOTE — Progress Notes (Signed)
VASCULAR LAB PRELIMINARY  PRELIMINARY  PRELIMINARY  PRELIMINARY  Carotid Dopplers completed.    Preliminary report:  1-39% ICA stenosis.  Vertebral artery flow is antegrade.   Daleen Steinhaus, RVT 05/17/2014, 4:20 PM

## 2014-05-17 NOTE — Progress Notes (Addendum)
PROGRESS NOTE    Taylor Vazquez CZY:[id number] DOB: [birth year] DOA: 05/16/2014 PCP: Default, Provider, MD  HPI/Brief narrative 48 year old female patient with history of sickle cell trait, hypertension, asthma, depression, obesity, ongoing tobacco abuse, prior MI, denies cocaine abuse, presented to the ED on 05/16/14 with 3 days history of right-sided numbness, weakness, blurred vision and slurred speech. She also gives history of midsternal pressure-like "feels like bricks sitting on my chest" chest discomfort, not relieved by TUMS and some water produced to palpation. CT head without acute abnormality. Not on aspirin prior to admission. Admitted for possible stroke and then chest pain evaluation.   Assessment/Plan:  1. 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 Complete stroke workup (MRI and MRA brain, 2-D echo, carotid Dopplers, hemoglobin A1c and fasting lipids). Initial CT head negative for acute findings. No TPA administered because out of window. Not on antithrombotic spread to admission-will be on aspirin 325 mg daily for secondary stroke prophylaxis. PT, OT and ST evaluation. Stroke risk factors: Hypertension, obesity, tobacco abuse. Start statins. 2. Chest pain: Some typical and atypical features. Ruled out for MI by troponins x5 negative. EKG showed sinus rhythm, Q waves in leads V1-2 without acute findings. DD-musculoskeletal. Cardiology consulted. 3. Hypertension: Mildly uncontrolled. Continue lisinopril. Add low-dose HCTZ. Avoid beta blockers secondary to cocaine positive UDS. 4. Tobacco abuse: Cessation counseled. 5. Cocaine positive UDS: Patient denies using and states that a cigarette that she borrowed from a friend might have been laced with it. Cessation counseled. 6. Asthma: Stable 7. Morbid obesity.   Code Status: Full Family Communication: None at bedside Disposition Plan: Home when medically  stable   Consultants:  Cardiology  Procedures:  None  Antibiotics:  None   Subjective: Persisting right-sided numbness and weakness. Denies headache or blurred vision at this time. Intermittent midsternal pressure-like chest pain, nonradiating, not associated with dyspnea or diaphoresis.  Objective: Filed Vitals:   05/16/14 1755 05/16/14 1950 05/17/14 0700 05/17/14 1037  BP: 157/86 157/86 154/86 149/89  Pulse: 80  85   Temp: 98.3 F (36.8 C) 97.7 F (36.5 C) 98.3 F (36.8 C)   TempSrc: Oral Oral Oral   Resp: 17 18 16    Height: 5\' 3"  (1.6 m)     Weight: 108.863 kg (240 lb)     SpO2: 94% 100% 95%     Intake/Output Summary (Last 24 hours) at 05/17/14 1125 Last data filed at 05/16/14 1430  Gross per 24 hour  Intake   1000 ml  Output      0 ml  Net   1000 ml   Filed Weights   05/16/14 1755  Weight: 108.863 kg (240 lb)     Exam:  General exam: Pleasant morbidly obese young female lying comfortably in bed Respiratory system: Clear. No increased work of breathing. Somewhat reproducible midsternal tenderness. Cardiovascular system: S1 & S2 heard, RRR. No JVD, murmurs, gallops, clicks or pedal edema. Telemetry: Sinus rhythm. Gastrointestinal system: Abdomen is nondistended, soft and nontender. Normal bowel sounds heard. Central nervous system: Alert and oriented. No focal neurological deficits. Extremities: 5 x 5 power in left limbs. 4+ by 5 power in right limbs with right hemisensory deficit.   Data Reviewed: Basic Metabolic Panel:  Recent Labs Lab 05/16/14 1152  NA 140  K 4.0  CL 105  CO2 20  GLUCOSE 89  BUN 9  CREATININE 0.96  CALCIUM 8.5   Liver Function Tests:  Recent Labs Lab 05/16/14 1152  AST 13  ALT 10  ALKPHOS  84  BILITOT 0.3  PROT 6.5  ALBUMIN 3.0*   No results found for this basename: LIPASE, AMYLASE,  in the last 168 hours No results found for this basename: AMMONIA,  in the last 168 hours CBC:  Recent Labs Lab 05/16/14 1152   WBC 7.0  NEUTROABS 3.3  HGB 14.5  HCT 43.7  MCV 84.5  PLT 229   Cardiac Enzymes:  Recent Labs Lab 05/16/14 1152 05/16/14 1622 05/16/14 2215 05/17/14 0330 05/17/14 0915  TROPONINI <0.30 <0.30 <0.30 <0.30 <0.30   BNP (last 3 results)  Recent Labs  05/16/14 1152  PROBNP 156.4*   CBG: No results found for this basename: GLUCAP,  in the last 168 hours  No results found for this or any previous visit (from the past 240 hour(s)).    Additional labs: 1. Blood alcohol level <11 2. Fasting lipids: LDL 95 3. UDS: Positive for opiates and cocaine. 4. Serum salicylate level <2 and acetaminophen <15     Studies: Dg Chest 2 View  05/16/2014   CLINICAL DATA:  Chest pressure, left shoulder pain, shortness of breath  EXAM: CHEST  2 VIEW  COMPARISON:  01/13/2014  FINDINGS: Stable medial left apical lucency compatible with bullous formation. Normal heart size and vascularity. Lungs remain otherwise clear. No focal pneumonia, collapse or consolidation. No effusion, edema or pneumothorax. Trachea midline.  IMPRESSION: Left apical bullous disease.  Stable exam without superimposed acute process.   Electronically Signed   By: Daryll Brod M.D.   On: 05/16/2014 11:22   Ct Head Wo Contrast  05/16/2014   CLINICAL DATA:  R sided weakness SHORTNESS OF BREATH  EXAM: CT HEAD WITHOUT CONTRAST  TECHNIQUE: Contiguous axial images were obtained from the base of the skull through the vertex without intravenous contrast.  COMPARISON:  02/08/2011  FINDINGS: There is no evidence of acute intracranial hemorrhage, brain edema, mass lesion, acute infarction, mass effect, or midline shift. Acute infarct may be inapparent on noncontrast CT. No other intra-axial abnormalities are seen, and the ventricles and sulci are within normal limits in size and symmetry. No abnormal extra-axial fluid collections or masses are identified. No significant calvarial abnormality.  IMPRESSION: 1. Negative for bleed or other acute  intracranial process.   Electronically Signed   By: Arne Cleveland M.D.   On: 05/16/2014 14:18        Scheduled Meds: .  stroke: mapping our early stages of recovery book   Does not apply Once  . aspirin  325 mg Oral Daily  . enoxaparin (LOVENOX) injection  40 mg Subcutaneous Q24H  . lisinopril  40 mg Oral BID  . loratadine  10 mg Oral Daily   Continuous Infusions:   Active Problems:   Right sided weakness   Asthma, chronic   Chest discomfort   Bug bite    Time spent: 35 minutes.    Vernell Leep, MD, FACP, FHM. [facility] Pager [phone]  If 7PM-7AM, please contact night-coverage [email or web address] Password [email or web address] 05/17/2014, 11:25 AM    LOS: 1 day

## 2014-05-18 ENCOUNTER — Encounter (HOSPITAL_COMMUNITY): Payer: Self-pay | Admitting: *Deleted

## 2014-05-18 ENCOUNTER — Observation Stay (HOSPITAL_COMMUNITY): Payer: Self-pay

## 2014-05-18 MED ORDER — IOHEXOL 350 MG/ML SOLN
50.0000 mL | Freq: Once | INTRAVENOUS | Status: AC | PRN
  Administered 2014-05-18: 50 mL via INTRAVENOUS

## 2014-05-18 NOTE — Plan of Care (Signed)
Problem: Progression Outcomes Goal: Communication method established Outcome: Completed/Met Date Met:  05/18/14 Patient verbal

## 2014-05-18 NOTE — Progress Notes (Signed)
Physical Therapy Treatment Patient Details Name: Taylor Vazquez MRN: [id number] DOB: Oct 25, 1965 Today's Date: 05/18/2014    History of Present Illness Patient is a 48 y/o female admitted with chest discomfort and right sided weakness, numbness, slurred speech and blurred vision for 3 days admitted for possible CVA. Head CT (-). NIH-3. PMH positive for HTN, asthma, depression, sickle cell trait and obesity. Pt refusing MRI due to claustrophobia.  Repeat head CT on 05/18/14 also negative for acute infarct.     PT Comments    Pt is progressing with increased gait distance today.  She does fatigue quickly and required a second person to safely make it back to the room after walking into the hallway.  She has some inconsistencies in her MMT vs. Her functional mobility (see details below).  As she presents now, she would benefit from [place] and her friend reports he is comfortable helping her physically as he watched [place] walking with her down the hallway.    Follow Up Recommendations  Home health PT;Supervision/Assistance - 24 hour     Equipment Recommendations  Rolling walker with 5" wheels    Recommendations for Other Services   NA      Precautions / Restrictions Precautions Precautions: Fall Precaution Comments: right leg and arm weakness, poor activity tolerance    Mobility  Bed Mobility Overal bed mobility: Needs Assistance Bed Mobility: Supine to Sit     Supine to sit: Supervision     General bed mobility comments: Pt required extra time and use of railing, but lifted right and left leg against gravity easily.   Transfers Overall transfer level: Needs assistance Equipment used: Rolling walker (2 wheeled) Transfers: Sit to/from Stand Sit to Stand: Min assist         General transfer comment: Min assist to support trunk during transitions as pt demonstrated increased effort and heavy reliance on her hands to support her.   Ambulation/Gait Ambulation/Gait assistance:  Min assist;+2 physical assistance;Mod assist Ambulation Distance (Feet): 45 Feet Assistive device: Rolling walker (2 wheeled) Gait Pattern/deviations: Step-through pattern;Decreased step length - right;Decreased stance time - right;Decreased dorsiflexion - right;Decreased weight shift to right;Shuffle;Trunk flexed Gait velocity: decreased Gait velocity interpretation: Below normal speed for age/gender General Gait Details: Pt started out at min assist with only signs of right knee buckling, as she progressed with gait she started in internally rotate her right foot and drag it significantly more than at the beginning of gait requiring two person mod assist for gait to be safe going back to her room.       Modified Rankin (Stroke Patients Only) Modified Rankin (Stroke Patients Only) Pre-Morbid Rankin Score: No symptoms Modified Rankin: Moderately severe disability     Balance Overall balance assessment: Needs assistance Sitting-balance support: Feet supported;No upper extremity supported Sitting balance-Leahy Scale: Good     Standing balance support: Bilateral upper extremity supported Standing balance-Leahy Scale: Poor                      Cognition Arousal/Alertness: Awake/alert Behavior During Therapy: WFL for tasks assessed/performed Overall Cognitive Status: Within Functional Limits for tasks assessed                         General Comments General comments (skin integrity, edema, etc.): Pt continues to report blurry vision at end of session.  MMT in her right leg as 3/5 knee extension, hip flexion, 4/5 hamstring and 3/5 foot/ankle PF/DF.  This  is not consistant with her physical presentation during gait (3/5 strength at the ankle but presented during gait with 2/5 DF at the ankle).  PT spent extensive time discussing d/c therapy options and pt had questions about Medicaid application and I directed her to the financial counselor's office.       Pertinent  Vitals/Pain See vitals flow sheet.            PT Goals (current goals can now be found in the care plan section) Acute Rehab PT Goals Patient Stated Goal: to get better and go home. Progress towards PT goals: Progressing toward goals    Frequency  Min 4X/week    PT Plan Current plan remains appropriate       End of Session Equipment Utilized During Treatment: Gait belt Activity Tolerance: Patient limited by fatigue Patient left: in bed;with call bell/phone within reach;with family/visitor present     Time: 1354-1425 PT Time Calculation (min): 31 min  Charges:  $Gait Training: 8-22 mins $Self Care/Home Management: 8-22            Tytus Strahle B. [place], [place], DPT [phone]   05/18/2014, 4:02 PM

## 2014-05-18 NOTE — Progress Notes (Signed)
Stroke Team Progress Note  HISTORY Taylor Vazquez is an 48 y.o. female with a history of sickle cell trait, hypertension, asthma, depression and obesity. Patient also indicated she had a myocardial infarction about 3 years ago. She's presenting today with complaint of reduced movement in weakness involving her right side for 3 days prior to presentation to the hospital.. Family indicates that her speech has become slightly slurred. She's had no difficulty with swallowing. No facial weakness his been noted. CT scan of her head was obtained earlier today which showed no acute intracranial abnormality. Patient has not been compliant with taking aspirin daily as previously recommended. NIH stroke score was 4.  LSN: 05/13/2014  tPA Given: No: Beyond time under for treatment consideration  MRankin: 1  She was admitted to the neuro floor bed for further evaluation and treatment.  SUBJECTIVE Her  Family is not  at the bedside.  Overall she feels her condition is gradually improving. She still has right arm heaviness and weakness. She could not tolerate MRI scan despite sedation and hence it was canceled and CT angiogram of the brain was obtained instead  OBJECTIVE Most recent Vital Signs: Filed Vitals:   05/18/14 0029 05/18/14 0400 05/18/14 0800 05/18/14 1230  BP: 142/83 140/80 107/77 120/91  Pulse: 86 88 84 89  Temp: 98 F (36.7 C) 98.3 F (36.8 C) 98.7 F (37.1 C) 98.2 F (36.8 C)  TempSrc: Oral Oral Oral Oral  Resp: 18 18 18 18   Height:      Weight:      SpO2: 93% 95% 95% 96%   CBG (last 3)  No results found for this basename: GLUCAP,  in the last 72 hours  IV Fluid Intake:     MEDICATIONS  .  stroke: mapping our early stages of recovery book   Does not apply Once  . aspirin  325 mg Oral Daily  . enoxaparin (LOVENOX) injection  40 mg Subcutaneous Q24H  . hydrochlorothiazide  12.5 mg Oral Daily  . lisinopril  40 mg Oral BID  . loratadine  10 mg Oral Daily  . simvastatin  20 mg  Oral q1800   PRN:  albuterol, diphenhydrAMINE, senna-docusate, traMADol  Diet:  Cardiac   Activity:  Bedrest    DVT Prophylaxis:  SCDs  CLINICALLY SIGNIFICANT STUDIES Basic Metabolic Panel:   Recent Labs Lab 05/16/14 1152  NA 140  K 4.0  CL 105  CO2 20  GLUCOSE 89  BUN 9  CREATININE 0.96  CALCIUM 8.5   Liver Function Tests:   Recent Labs Lab 05/16/14 1152  AST 13  ALT 10  ALKPHOS 84  BILITOT 0.3  PROT 6.5  ALBUMIN 3.0*   CBC:   Recent Labs Lab 05/16/14 1152  WBC 7.0  NEUTROABS 3.3  HGB 14.5  HCT 43.7  MCV 84.5  PLT 229   Coagulation:   Recent Labs Lab 05/16/14 1622  LABPROT 13.7  INR 1.05   Cardiac Enzymes:   Recent Labs Lab 05/16/14 2215 05/17/14 0330 05/17/14 0915  TROPONINI <0.30 <0.30 <0.30   Urinalysis: No results found for this basename: COLORURINE, APPERANCEUR, LABSPEC, PHURINE, GLUCOSEU, HGBUR, BILIRUBINUR, KETONESUR, PROTEINUR, UROBILINOGEN, NITRITE, LEUKOCYTESUR,  in the last 168 hours Lipid Panel    Component Value Date/Time   CHOL 154 05/17/2014 0330   TRIG 135 05/17/2014 0330   HDL 32* 05/17/2014 0330   CHOLHDL 4.8 05/17/2014 0330   VLDL 27 05/17/2014 0330   LDLCALC 95 05/17/2014 0330   HgbA1C  Lab  Results  Component Value Date   HGBA1C 5.9* 05/17/2014    Urine Drug Screen:     Component Value Date/Time   LABOPIA POSITIVE* 05/16/2014 1556   COCAINSCRNUR POSITIVE* 05/16/2014 1556   LABBENZ NONE DETECTED 05/16/2014 1556   AMPHETMU NONE DETECTED 05/16/2014 1556   THCU NONE DETECTED 05/16/2014 1556   LABBARB NONE DETECTED 05/16/2014 1556    Alcohol Level:   Recent Labs Lab 05/16/14 1622  ETH <11    Ct Head Wo Contrast  05/16/2014   CLINICAL DATA:  R sided weakness SHORTNESS OF BREATH  EXAM: CT HEAD WITHOUT CONTRAST  TECHNIQUE: Contiguous axial images were obtained from the base of the skull through the vertex without intravenous contrast.  COMPARISON:  02/08/2011  FINDINGS: There is no evidence of acute intracranial  hemorrhage, brain edema, mass lesion, acute infarction, mass effect, or midline shift. Acute infarct may be inapparent on noncontrast CT. No other intra-axial abnormalities are seen, and the ventricles and sulci are within normal limits in size and symmetry. No abnormal extra-axial fluid collections or masses are identified. No significant calvarial abnormality.  IMPRESSION: 1. Negative for bleed or other acute intracranial process.   Electronically Signed   By: Arne Cleveland M.D.   On: 05/16/2014 14:18     MRI of the brain  patient claustrophobic and refused despite MRA of the brain patient refused  Carotid Doppler  1-39% ICA stenosis. Vertebral artery flow is antegrade.    CT angiogram brain final report pending but by my review no large vessel stenosis/occlusion. No acute infarct seen   2D Echocardiogram Left ventricle: The cavity size was normal. There was moderate concentric hypertrophy. Systolic function was normal. The estimated ejection fraction was in the range of 55% to 60%. Wall motion was normal; there were no regional wall motion abnormalities.  CXR  Left apical bullous disease.  Stable exam without superimposed acute process.    EKG  Normal sinus rhythm Septal infarct , age undetermined  Therapy Recommendations  pending Physical Exam   Middle aged african american aldy not in distress.Awake alert. Afebrile. Head is nontraumatic. Neck is supple without bruit. Hearing is normal. Cardiac exam no murmur or gallop. Lungs are clear to auscultation. Distal pulses are well felt. Neurological Exam : Awake alert oriented x 3 normal speech and language. Mild right lower face asymmetry. Tongue midline. No drift. Mild diminished fine finger movements on right. Orbits left over right upper extremity. Mild right grip weak.. Normal sensation . Normal coordination. ASSESSMENT Ms. Taylor Vazquez is a 48 y.o. female presenting with dysarthria and right sided weakness and chest pain.    .CT head negative and chest pain w/u negative for MI so far.  On no antiplatelets prior to admission. Now on Aspirin 325 mg for secondary stroke prevention. Patient with resultant mild subjective right sided weakness. Stroke work up underway.   Cocaine positive  LDL 95 mg % Not on statins PTA  Hypertension on Lisinopril PTA   Hospital day # 2  TREATMENT/PLAN  Continue   aspirin 325 mg orally every day for secondary stroke prevention.  Patient counselled to  Quit cocaine and be complaint with her HT medicines. Stroke team to sign off. Kindly call for questions. Outpatient followup with Dr.Xu in vascular neurologic clinic in 4 weeks   Antony Contras, MD   Pager: [phone] 05/18/2014 1:43 PM  SIGNED    To contact Stroke Continuity provider, please refer to [email or web address] After hours, contact General Neurology

## 2014-05-18 NOTE — Progress Notes (Addendum)
PROGRESS NOTE    TENESSA MARSEE OEV:[id number] DOB: Dec 14, 1965 DOA: 05/16/2014 PCP: Default, Provider, MD  HPI/Brief narrative 48 year old female patient with history of sickle cell trait, hypertension, asthma, depression, obesity, ongoing tobacco abuse, prior MI, denies cocaine abuse, presented to the ED on 05/16/14 with 3 days history of right-sided numbness, weakness, blurred vision and slurred speech. She also gives history of midsternal pressure-like "feels like bricks sitting on my chest" chest discomfort, not relieved by TUMS and some water produced to palpation. CT head without acute abnormality. Not on aspirin prior to admission. Admitted for possible stroke and then chest pain evaluation.   Assessment/Plan:  1. 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 Completed stroke workup. Unable to performed MRI and MRA brain secondary to claustrophobia in closed MRI-thereby performed CT angiogram of head with contrast 7/28-report pending. Initial CT head 7/26 was negative for acute findings. 2-D echo: EF 55-60% without wall motion abnormalities. Carotid Dopplers: 1-39% ICA stenosis. LDL 95-started simvastatin 20 mg daily. Hemoglobin A1c 5.9. No TPA administered because out of window. Not on antithrombotic prior to admission-now on aspirin 325 mg daily for secondary stroke prophylaxis. PT evaluated on 7/27 and recommended home health PT with 24-hour supervision/assistance pending improvement-will await followup. Stroke risk factors: Hypertension, obesity, tobacco abuse. Neurology/stroke team followup appreciated-signed off 05/18/14. Outpatient followup with Dr. Rosalin Hawking in 4 weeks. 2. Chest pain: Some typical and atypical features. Ruled out for MI by troponins x5 negative. EKG showed sinus rhythm, Q waves in leads V1-2 without acute findings. DD-musculoskeletal. Chest pain resolved. Echo unremarkable. Cardiology signed off and no further workup recommended. 3. Hypertension: Mildly  uncontrolled. Continue lisinopril. Added low-dose HCTZ. Avoid beta blockers secondary to cocaine positive UDS. Better control. 4. Tobacco abuse: Cessation counseled. 5. Cocaine positive UDS: Patient denies using and states that a cigarette that she borrowed from a friend might have been laced with it. Cessation counseled. 6. Asthma: Stable 7. Morbid obesity.   Code Status: Full Family Communication: None at bedside Disposition Plan: Home possibly 7/29 pending CTA brain results, PT f/u and CM assistance with home medications.   Consultants:  Cardiology  Neurology  Procedures:  None  Antibiotics:  None   Subjective: No further chest pain. Continues to complain of numbness over the right half of the body with weakness of right limbs which is slightly better than yesterday.  Objective: Filed Vitals:   05/18/14 0029 05/18/14 0400 05/18/14 0800 05/18/14 1230  BP: 142/83 140/80 107/77 120/91  Pulse: 86 88 84 89  Temp: 98 F (36.7 C) 98.3 F (36.8 C) 98.7 F (37.1 C) 98.2 F (36.8 C)  TempSrc: Oral Oral Oral Oral  Resp: 18 18 18 18   Height:      Weight:      SpO2: 93% 95% 95% 96%    Intake/Output Summary (Last 24 hours) at 05/18/14 1430 Last data filed at 05/18/14 0500  Gross per 24 hour  Intake      0 ml  Output      3 ml  Net     -3 ml   Filed Weights   05/16/14 1755  Weight: 108.863 kg (240 lb)     Exam:  General exam: Pleasant morbidly obese young female lying comfortably in bed Respiratory system: Clear. No increased work of breathing. Somewhat reproducible midsternal tenderness- better. Cardiovascular system: S1 & S2 heard, RRR. No JVD, murmurs, gallops, clicks or pedal edema. Telemetry: Sinus rhythm- DC'ed 7/28. Gastrointestinal system: Abdomen is nondistended, soft and nontender.  Normal bowel sounds heard. Central nervous system: Alert and oriented. No focal neurological deficits. Extremities: 5 x 5 power in left limbs. 4+ by 5 power in right limbs  with right hemisensory deficit.   Data Reviewed: Basic Metabolic Panel:  Recent Labs Lab 05/16/14 1152  NA 140  K 4.0  CL 105  CO2 20  GLUCOSE 89  BUN 9  CREATININE 0.96  CALCIUM 8.5   Liver Function Tests:  Recent Labs Lab 05/16/14 1152  AST 13  ALT 10  ALKPHOS 84  BILITOT 0.3  PROT 6.5  ALBUMIN 3.0*   No results found for this basename: LIPASE, AMYLASE,  in the last 168 hours No results found for this basename: AMMONIA,  in the last 168 hours CBC:  Recent Labs Lab 05/16/14 1152  WBC 7.0  NEUTROABS 3.3  HGB 14.5  HCT 43.7  MCV 84.5  PLT 229   Cardiac Enzymes:  Recent Labs Lab 05/16/14 1152 05/16/14 1622 05/16/14 2215 05/17/14 0330 05/17/14 0915  TROPONINI <0.30 <0.30 <0.30 <0.30 <0.30   BNP (last 3 results)  Recent Labs  05/16/14 1152  PROBNP 156.4*   CBG: No results found for this basename: GLUCAP,  in the last 168 hours  No results found for this or any previous visit (from the past 240 hour(s)).    Additional labs: 1. Blood alcohol level <11 2. Fasting lipids: LDL 95 3. UDS: Positive for opiates and cocaine. 4. Serum salicylate level <2 and acetaminophen <15     Studies: No results found.      Scheduled Meds: .  stroke: mapping our early stages of recovery book   Does not apply Once  . aspirin  325 mg Oral Daily  . enoxaparin (LOVENOX) injection  40 mg Subcutaneous Q24H  . hydrochlorothiazide  12.5 mg Oral Daily  . lisinopril  40 mg Oral BID  . loratadine  10 mg Oral Daily  . simvastatin  20 mg Oral q1800   Continuous Infusions:   Principal Problem:   CVA (cerebral infarction) Active Problems:   Asthma, chronic   Chest discomfort   Bug bite   Chest wall pain   Tobacco use disorder    Time spent: 35 minutes.    Vernell Leep, MD, FACP, FHM. [facility] Pager [phone]  If 7PM-7AM, please contact night-coverage [email or web address] Password [email or web address] 05/18/2014, 2:30 PM    LOS: 2 days

## 2014-05-18 NOTE — Progress Notes (Addendum)
SUBJECTIVE:  Reports weakness.  No chest pain now.  OBJECTIVE:   Vitals:   Filed Vitals:   05/17/14 2000 05/18/14 0029 05/18/14 0400 05/18/14 0800  BP: 152/88 142/83 140/80 107/77  Pulse: 85 86 88 84  Temp: 98.2 F (36.8 C) 98 F (36.7 C) 98.3 F (36.8 C) 98.7 F (37.1 C)  TempSrc: Oral Oral Oral Oral  Resp: 18 18 18 18   Height:      Weight:      SpO2: 97% 93% 95% 95%   I&O's:   Intake/Output Summary (Last 24 hours) at 05/18/14 1210 Last data filed at 05/18/14 0500  Gross per 24 hour  Intake      0 ml  Output      3 ml  Net     -3 ml   TELEMETRY: Reviewed telemetry pt in NSR:     PHYSICAL EXAM General: Well developed, well nourished, in no acute distress Head:   Normal cephalic and atramatic  Lungs:  No wheezing. Heart:   HRRR    Abdomen: obese Msk:   Normal strength and tone for age.   Neuro: Alert and oriented. Psych:  Normal affect, responds appropriately   LABS: Basic Metabolic Panel:  Recent Labs  05/16/14 1152  NA 140  K 4.0  CL 105  CO2 20  GLUCOSE 89  BUN 9  CREATININE 0.96  CALCIUM 8.5   Liver Function Tests:  Recent Labs  05/16/14 1152  AST 13  ALT 10  ALKPHOS 84  BILITOT 0.3  PROT 6.5  ALBUMIN 3.0*   No results found for this basename: LIPASE, AMYLASE,  in the last 72 hours CBC:  Recent Labs  05/16/14 1152  WBC 7.0  NEUTROABS 3.3  HGB 14.5  HCT 43.7  MCV 84.5  PLT 229   Cardiac Enzymes:  Recent Labs  05/16/14 2215 05/17/14 0330 05/17/14 0915  TROPONINI <0.30 <0.30 <0.30   BNP: No components found with this basename: POCBNP,  D-Dimer: No results found for this basename: DDIMER,  in the last 72 hours Hemoglobin A1C:  Recent Labs  05/17/14 0330  HGBA1C 5.9*   Fasting Lipid Panel:  Recent Labs  05/17/14 0330  CHOL 154  HDL 32*  LDLCALC 95  TRIG 135  CHOLHDL 4.8   Thyroid Function Tests: No results found for this basename: TSH, T4TOTAL, FREET3, T3FREE, THYROIDAB,  in the last 72 hours Anemia  Panel: No results found for this basename: VITAMINB12, FOLATE, FERRITIN, TIBC, IRON, RETICCTPCT,  in the last 72 hours Coag Panel:   Lab Results  Component Value Date   INR 1.05 05/16/2014    RADIOLOGY: Dg Chest 2 View  05/16/2014   CLINICAL DATA:  Chest pressure, left shoulder pain, shortness of breath  EXAM: CHEST  2 VIEW  COMPARISON:  01/13/2014  FINDINGS: Stable medial left apical lucency compatible with bullous formation. Normal heart size and vascularity. Lungs remain otherwise clear. No focal pneumonia, collapse or consolidation. No effusion, edema or pneumothorax. Trachea midline.  IMPRESSION: Left apical bullous disease.  Stable exam without superimposed acute process.   Electronically Signed   By: Daryll Brod M.D.   On: 05/16/2014 11:22   Ct Head Wo Contrast  05/16/2014   CLINICAL DATA:  R sided weakness SHORTNESS OF BREATH  EXAM: CT HEAD WITHOUT CONTRAST  TECHNIQUE: Contiguous axial images were obtained from the base of the skull through the vertex without intravenous contrast.  COMPARISON:  02/08/2011  FINDINGS: There is no evidence of acute  intracranial hemorrhage, brain edema, mass lesion, acute infarction, mass effect, or midline shift. Acute infarct may be inapparent on noncontrast CT. No other intra-axial abnormalities are seen, and the ventricles and sulci are within normal limits in size and symmetry. No abnormal extra-axial fluid collections or masses are identified. No significant calvarial abnormality.  IMPRESSION: 1. Negative for bleed or other acute intracranial process.   Electronically Signed   By: Arne Cleveland M.D.   On: 05/16/2014 14:18      ASSESSMENT: Noncardiac chest pain  PLAN:  Echo reviewed and unremarkable.  No further cardiac w/u planned.  Will sign off.  Further w/u for weakness per neuro.  Jettie Booze., MD  05/18/2014  12:10 PM

## 2014-05-18 NOTE — Care Management Note (Signed)
    Page 1 of 2   05/19/2014     1:18:22 PM CARE MANAGEMENT NOTE 05/19/2014  Patient:  Taylor Vazquez, Taylor Vazquez   Account Number:  [id number]  Date Initiated:  05/18/2014  Documentation initiated by:  GRAVES-BIGELOW,Ariabella Brien  Subjective/Objective Assessment:   Pt admitted for right-sided numbness, weakness, blurred vision and slurred speech. Pt without insurance at this time. CM did call the Financial Counselor  to assist the pt with questionable medicaid.     Action/Plan:   Pt previously on generic meds-Please try to d/c pt on generic medications possibly from [facility] $4.00 list. PT recommendations for [facility] PT/OT. CM did try to call [place] to see if eligible for charity cases for therapy. Awaiting call back.   Anticipated DC Date:  05/19/2014   Anticipated DC Plan:  [place]  In-house referral  [place]  CM consult  [facility]  Medication Assistance      [facility] Choice  [place]   Choice offered to / List presented to:  C-1 Patient        [place] arranged  [place]   Status of service:  Completed, signed off Medicare Important Message given?  NO (If response is "NO", the following Medicare IM given date fields will be blank) Date Medicare IM given:   Medicare IM given by:   Date Additional Medicare IM given:   Additional Medicare IM given by:    Discharge Disposition:  [place]  Per UR Regulation:  Reviewed for med. necessity/level of care/duration of stay  If discussed at [place] of Stay Meetings, dates discussed:    Comments:   05-19-14 [address] Jacqlyn Krauss, RN,BSN [phone] CM did provide [facility] information for pt to call and set up PCP/Hospital F/u. Care [place] agreeable to take pt for PT/OT services. SOC to begin within 24-48 hrs post d/c. No further needs at this time.    05-18-14 [place], RN, BSN [phone] CM did call the [facility] and [facility] for hospital f/u and PCP appointment, However received voice mail. CM did leave a message awaiting retrun phone call. CM did provide pt with the [facility] number for pt to call as well. No further needs from CM at this time.

## 2014-05-18 NOTE — Progress Notes (Signed)
Physical Therapy Note  Late entry for missed G-codes   May 30, 2014 1436  PT G-Codes **NOT FOR INPATIENT CLASS**  Functional Assessment Tool Used clinical judgment  Functional Limitation Mobility: Walking and moving around  Mobility: Walking and Moving Around Current Status (Y6415) CJ  Mobility: Walking and Moving Around Goal Status (A3094) CI   Candy Sledge, PT, DPT [phone]

## 2014-05-18 NOTE — Progress Notes (Signed)
Utilization review completed.  

## 2014-05-19 DIAGNOSIS — F1410 Cocaine abuse, uncomplicated: Secondary | ICD-10-CM

## 2014-05-19 DIAGNOSIS — J45909 Unspecified asthma, uncomplicated: Secondary | ICD-10-CM

## 2014-05-19 DIAGNOSIS — R079 Chest pain, unspecified: Secondary | ICD-10-CM

## 2014-05-19 MED ORDER — ASPIRIN 325 MG PO TABS: 325.0000 mg | ORAL_TABLET | Freq: Every day | ORAL | Status: DC

## 2014-05-19 MED ORDER — TRAMADOL HCL 50 MG PO TABS: 50.0000 mg | ORAL_TABLET | Freq: Four times a day (QID) | ORAL | Status: DC | PRN

## 2014-05-19 MED ORDER — HYDROCHLOROTHIAZIDE 25 MG PO TABS: 25.0000 mg | ORAL_TABLET | Freq: Every day | ORAL | Status: DC

## 2014-05-19 MED ORDER — HYDROCHLOROTHIAZIDE 12.5 MG PO CAPS: 12.5000 mg | ORAL_CAPSULE | Freq: Every day | ORAL | Status: DC

## 2014-05-19 MED ORDER — LORATADINE 10 MG PO TABS: 10.0000 mg | ORAL_TABLET | Freq: Every day | ORAL | Status: DC

## 2014-05-19 MED ORDER — SIMVASTATIN 20 MG PO TABS: 20.0000 mg | ORAL_TABLET | Freq: Every day | ORAL | Status: DC

## 2014-05-19 NOTE — Progress Notes (Signed)
Physical Therapy Treatment Patient Details Name: Taylor Vazquez MRN: [id number] DOB: April 27, 1966 Today's Date: 05/19/2014    History of Present Illness Patient is a 48 y/o female admitted with chest discomfort and right sided weakness, numbness, slurred speech and blurred vision for 3 days admitted for possible CVA. Head CT (-). NIH-3. PMH positive for HTN, asthma, depression, sickle cell trait and obesity. Pt refusing MRI due to claustrophobia.  Repeat head CT on 05/18/14 also negative for acute infarct.     PT Comments    Pt was seen for visit to observe gait, transfers with continuing concern for her safety with gait at home.  Plans to go home today per her report of MD conversation, and has planned to take only RW.  Discussed safety with environment and help, advised her to not go out of house on [facility] as she had planned.  Will need RW for all gait.  Follow Up Recommendations  Home health PT     Equipment Recommendations  Rolling walker with 5" wheels    Recommendations for Other Services       Precautions / Restrictions Precautions Precautions: Fall Precaution Comments: R foot drop and difficulty walking more than a few feet at a time on the walker Restrictions Weight Bearing Restrictions: No    Mobility  Bed Mobility Overal bed mobility: Needs Assistance Bed Mobility: Supine to Sit     Supine to sit: Supervision        Transfers Overall transfer level: Needs assistance Equipment used: Rolling walker (2 wheeled) Transfers: Sit to/from Stand Sit to Stand: Min guard            Ambulation/Gait Ambulation/Gait assistance: Min guard Ambulation Distance (Feet): 50 Feet Assistive device: Rolling walker (2 wheeled) Gait Pattern/deviations: Step-through pattern Gait velocity: decreased Gait velocity interpretation: Below normal speed for age/gender General Gait Details: Pt started with sliding R foot and with cues could lift enough to clear the floor  minimally.   Stairs            Wheelchair Mobility    Modified Rankin (Stroke Patients Only) Modified Rankin (Stroke Patients Only) Pre-Morbid Rankin Score: No symptoms Modified Rankin: Moderately severe disability     Balance Overall balance assessment: Needs assistance Sitting-balance support: Feet supported Sitting balance-Leahy Scale: Good     Standing balance support: Bilateral upper extremity supported Standing balance-Leahy Scale: Poor                      Cognition Arousal/Alertness: Awake/alert Behavior During Therapy: WFL for tasks assessed/performed Overall Cognitive Status: Within Functional Limits for tasks assessed                      Exercises      General Comments General comments (skin integrity, edema, etc.): Pt reports fatigue today after walks and with effort to correct R foot drop.  Her plan is to go home and was instructed not to use the cane she plans to have, but to rely on the RW especially for trips out of house.      Pertinent Vitals/Pain 135/77 BP, pulse 66, O2 sat 91%.       Home Living                      Prior Function            PT Goals (current goals can now be found in the care plan section) Acute Rehab PT  Goals Patient Stated Goal: to get better and go home. Progress towards PT goals: Progressing toward goals    Frequency  Min 4X/week    PT Plan Current plan remains appropriate    Co-evaluation             End of Session Equipment Utilized During Treatment: Other (comment) Activity Tolerance: Patient limited by fatigue Patient left: in bed;with call bell/phone within reach     Time: 0810-0828 PT Time Calculation (min): 18 min  Charges:  $Gait Training: 8-22 mins                    G Codes:      Ramond Dial 2014-06-06, 9:30 AM  Mee Hives, PT MS Acute Rehab Dept. Number: [phone]

## 2014-05-19 NOTE — Discharge Instructions (Signed)
STROKE/TIA DISCHARGE INSTRUCTIONS SMOKING Cigarette smoking nearly doubles your risk of having a stroke & is the single most alterable risk factor  If you smoke or have smoked in the last 12 months, you are advised to quit smoking for your health.  Most of the excess cardiovascular risk related to smoking disappears within a year of stopping.  Ask you doctor about anti-smoking medications  [facility] Quit Line: 1-800-QUIT NOW  Free Smoking Cessation Classes [phone]  CHOLESTEROL Know your levels; limit fat & cholesterol in your diet  Lipid Panel     Component Value Date/Time   CHOL 154 05/17/2014 0330   TRIG 135 05/17/2014 0330   HDL 32* 05/17/2014 0330   CHOLHDL 4.8 05/17/2014 0330   VLDL 27 05/17/2014 0330   LDLCALC 95 05/17/2014 0330      Many patients benefit from treatment even if their cholesterol is at goal.  Goal: Total Cholesterol (CHOL) less than 160  Goal:  Triglycerides (TRIG) less than 150  Goal:  HDL greater than 40  Goal:  LDL (LDLCALC) less than 100   BLOOD PRESSURE American Stroke Association blood pressure target is less that 120/80 mm/Hg  Your discharge blood pressure is:  BP: 151/82 mmHg  Monitor your blood pressure  Limit your salt and alcohol intake  Many individuals will require more than one medication for high blood pressure  DIABETES (A1c is a blood sugar average for last 3 months) Goal HGBA1c is under 7% (HBGA1c is blood sugar average for last 3 months)  Diabetes: No known diagnosis of diabetes    Lab Results  Component Value Date   HGBA1C 5.9* 05/17/2014     Your HGBA1c can be lowered with medications, healthy diet, and exercise.  Check your blood sugar as directed by your physician  Call your physician if you experience unexplained or low blood sugars.  PHYSICAL ACTIVITY/REHABILITATION Goal is 30 minutes at least 4 days per week  Activity: Increase activity slowly, Therapies: HOME HEALTH Return to work:  Activity decreases your risk of  heart attack and stroke and makes your heart stronger.  It helps control your weight and blood pressure; helps you relax and can improve your mood.  Participate in a regular exercise program.  Talk with your doctor about the best form of exercise for you (dancing, walking, swimming, cycling).  DIET/WEIGHT Goal is to maintain a healthy weight  Your discharge diet is: Cardiac REGULAR liquids Your height is:  Height: 5\' 3"  (160 cm) Your current weight is: Weight: 107.639 kg (237 lb 4.8 oz) Your Body Mass Index (BMI) is:  BMI (Calculated): 42.6  Following the type of diet specifically designed for you will help prevent another stroke.  Your goal weight range is:  107 - 136  Your goal Body Mass Index (BMI) is 19-24.  Healthy food habits can help reduce 3 risk factors for stroke:  High cholesterol, hypertension, and excess weight.  RESOURCES Stroke/Support Group:  Call [phone]   STROKE EDUCATION PROVIDED/REVIEWED AND GIVEN TO PATIENT Stroke warning signs and symptoms How to activate emergency medical system (call 911). Medications prescribed at discharge. Need for follow-up after discharge. Personal risk factors for stroke. Pneumonia vaccine given: No Flu vaccine given: No My questions have been answered, the writing is legible, and I understand these instructions.  I will adhere to these goals & educational materials that have been provided to me after my discharge from the hospital.   Stroke Prevention Some medical conditions and behaviors are associated with an  increased chance of having a stroke. You may prevent a stroke by making healthy choices and managing medical conditions. HOW CAN I REDUCE MY RISK OF HAVING A STROKE?   Stay physically active. Get at least 30 minutes of activity on most or all days.  Do not smoke. It may also be helpful to avoid exposure to secondhand smoke.  Limit alcohol use. Moderate alcohol use is considered to be:  No more than 2 drinks per day for  men.  No more than 1 drink per day for nonpregnant women.  Eat healthy foods. This involves:  Eating 5 or more servings of fruits and vegetables a day.  Making dietary changes that address high blood pressure (hypertension), high cholesterol, diabetes, or obesity.  Manage your cholesterol levels.  Making food choices that are high in fiber and low in saturated fat, trans fat, and cholesterol may control cholesterol levels.  Take any prescribed medicines to control cholesterol as directed by your health care provider.  Manage your diabetes.  Controlling your carbohydrate and sugar intake is recommended to manage diabetes.  Take any prescribed medicines to control diabetes as directed by your health care provider.  Control your hypertension.  Making food choices that are low in salt (sodium), saturated fat, trans fat, and cholesterol is recommended to manage hypertension.  Take any prescribed medicines to control hypertension as directed by your health care provider.  Maintain a healthy weight.  Reducing calorie intake and making food choices that are low in sodium, saturated fat, trans fat, and cholesterol are recommended to manage weight.  Stop drug abuse.  Avoid taking birth control pills.  Talk to your health care provider about the risks of taking birth control pills if you are over 52 years old, smoke, get migraines, or have ever had a blood clot.  Get evaluated for sleep disorders (sleep apnea).  Talk to your health care provider about getting a sleep evaluation if you snore a lot or have excessive sleepiness.  Take medicines only as directed by your health care provider.  For some people, aspirin or blood thinners (anticoagulants) are helpful in reducing the risk of forming abnormal blood clots that can lead to stroke. If you have the irregular heart rhythm of atrial fibrillation, you should be on a blood thinner unless there is a good reason you cannot take  them.  Understand all your medicine instructions.  Make sure that other conditions (such as anemia or atherosclerosis) are addressed. SEEK IMMEDIATE MEDICAL CARE IF:   You have sudden weakness or numbness of the face, arm, or leg, especially on one side of the body.  Your face or eyelid droops to one side.  You have sudden confusion.  You have trouble speaking (aphasia) or understanding.  You have sudden trouble seeing in one or both eyes.  You have sudden trouble walking.  You have dizziness.  You have a loss of balance or coordination.  You have a sudden, severe headache with no known cause.  You have new chest pain or an irregular heartbeat. Any of these symptoms may represent a serious problem that is an emergency. Do not wait to see if the symptoms will go away. Get medical help at once. Call your local emergency services (911 in [place] Do not drive yourself to the hospital. Document Released: 11/15/2004 Document Revised: 02/22/2014 Document Reviewed: 04/10/2013 [facility] Patient Information 2015 [place], [place]. This information is not intended to replace advice given to you by your health care provider. Make sure you  discuss any questions you have with your health care provider.

## 2014-05-19 NOTE — Progress Notes (Signed)
D/C IV, D/C instructions reviewed with pt., D/C paperwork and prescriptions given to pt., pt. Belongings including provided walker from [facility] sent with pt.,pt. And pt.'s daughter had packed pt.'s belongings prior to me discharging her, pt. Left unit via wheelchair by volunteer, pt. Transported home via pt.'s daughter, pt. Showed no signs or symptoms of distress or discomfort.

## 2014-05-19 NOTE — Progress Notes (Addendum)
Pt.'s B/P was 159/134 Map 140 and HR 88 and pt. Was symptomatic with severe Headache and Blurry Vision. I text paged MD Rai on call about pt.'s condition. I gave scheduled B/P Medications and rechecked pt.'s B/P and it was now 131/93 Map 102 HR 98. I re-paged MD on call again to see if any additional PRN B/P meds needed because pt. Still had HA and Blurred Vision. MD called back and stated to let pt. Eat lunch, recheck B/P and if it is still trending down/normal then pt. Will still D/C home today but if not then pt. Will need to stay longer for further evaluation/monitoring of her B/P.

## 2014-05-19 NOTE — Discharge Summary (Addendum)
Physician Discharge Summary  Patient ID: Taylor Vazquez MRN: [id number] DOB/AGE: 12-11-1965 48 y.o.  Admit date: 05/16/2014 Discharge date: 05/19/2014  Primary Care Physician:  Default, Provider, MD  Discharge Diagnoses:    . right-sided weakness, CT angiogram negative for stroke, unclear etiology  . Asthma, chronic . Chest discomfort . Bug bite . Chest wall pain . Tobacco use disorder . Cocaine abuse  Consults: Cardiology                    Neurology   Recommendations for Outpatient Follow-up:  Patient was strongly recommended to be compliant with her medication and stop using cocaine  Allergies:   Allergies  Allergen Reactions  . Bee Venom Anaphylaxis  . Shellfish Allergy Anaphylaxis and Swelling     Discharge Medications:   Medication List         acetaminophen 500 MG tablet  Commonly known as:  TYLENOL  Take 1,000 mg by mouth every 6 (six) hours as needed for mild pain.     albuterol 108 (90 BASE) MCG/ACT inhaler  Commonly known as:  PROVENTIL HFA;VENTOLIN HFA  Inhale 2 puffs into the lungs every 4 (four) hours as needed for wheezing or shortness of breath.     aspirin 325 MG tablet  Take 1 tablet (325 mg total) by mouth daily.     hydrochlorothiazide 25 MG tablet  Commonly known as:  HYDRODIURIL  Take 1 tablet (25 mg total) by mouth daily.  Start taking on:  05/20/2014     lisinopril 20 MG tablet  Commonly known as:  PRINIVIL,ZESTRIL  Take 40 mg by mouth 2 (two) times daily.     loratadine 10 MG tablet  Commonly known as:  CLARITIN  Take 1 tablet (10 mg total) by mouth daily.     simvastatin 20 MG tablet  Commonly known as:  ZOCOR  Take 1 tablet (20 mg total) by mouth at bedtime.     traMADol 50 MG tablet  Commonly known as:  ULTRAM  Take 1 tablet (50 mg total) by mouth every 6 (six) hours as needed for moderate pain.         Brief H and P: For complete details please refer to admission H and P, but in brief Taylor Vazquez is a 48  y.o. female with prior h/o hypertension, asthma, presented with right-sided weakness and chest discomfort for 3 days prior to admission. She presented with complaints of not feeling well, subjective fevers and chills. She also reported tingling and numbness of the right lower and upper extremity. She denied any headache, dizziness, or palpitations. She reported chest discomfort, is substernal and tender to palpation. Reported sob on exertion, which is apparently the baseline for her,. No orthopnea or PND or pedal edema.  On arrival to ED, she underwent a CT head without contrast for evaluation of stroke. Neurology was consulted. She was referred to [facility] service for admission.   Hospital Course:  48 year old female patient with history of sickle cell trait, hypertension, asthma, depression, obesity, ongoing tobacco abuse, prior MI, denies cocaine abuse, presented to the ED on 05/16/14 with 3 days history of right-sided numbness, weakness, blurred vision and slurred speech. She also gives history of midsternal pressure-like "feels like bricks sitting on my chest" chest discomfort, not relieved by TUMS and some water produced to palpation. CT head without acute abnormality. Not on aspirin prior to admission. Patient was admitted for possible stroke and then chest pain evaluation.  Right-sided weakness: -  Neurology was consulted. Unable to perform MRI, MRA brain scan due to claustrophobia enclosed MRI. Patient underwent CT angiogram of the head which showed no evidence of acute infarct, hemorrhage or mass effect. No proximal stenosis, aneurysm or occlusion, mild irregularity of the mid basilar artery without stenosis. Initial CT head 7/26 was negative for acute findings.  2-D echo: EF 55-60% without wall motion abnormalities.  Carotid Dopplers: 1-39% ICA stenosis.  LDL 95-started simvastatin 20 mg daily.  Hemoglobin A1c 5.9. No TPA administered because out of window.  Not on antithrombotic prior to  admission-now on aspirin 325 mg daily for secondary stroke prophylaxis.  PT evaluated on 7/27 and recommended home health PT which was arranged Outpatient followup with Dr. Rosalin Hawking in 4 weeks.  Chest pain: Some typical and atypical features, possibly mask or skeletal. Ruled out for acute ACS with negative troponins x5 negative. EKG showed sinus rhythm, Q waves in leads V1-2 without acute findings.  Chest pain resolved. Echo unremarkable. Cardiology signed off and no further workup recommended  Hypertension: Mildly uncontrolled. Continue lisinopril. Added low-dose HCTZ. Avoid beta blockers secondary to cocaine positive UDS. Better control  Tobacco abuse: Patient was counseled smoking cessation  Day of Discharge BP 151/82  Pulse 82  Temp(Src) 98.2 F (36.8 C) (Oral)  Resp 18  Ht 5\' 3"  (1.6 m)  Wt 107.639 kg (237 lb 4.8 oz)  BMI 42.05 kg/m2  SpO2 91%  LMP 05/09/2014  Physical Exam: General: Alert and awake oriented x3 not in any acute distress. HEENT: anicteric sclera, pupils reactive to light and accommodation CVS: S1-S2 clear no murmur rubs or gallops Chest: clear to auscultation bilaterally, no wheezing rales or rhonchi Abdomen: soft nontender, nondistended, normal bowel sounds Extremities: no cyanosis, clubbing or edema noted bilaterally Neuro: Cranial nerves II-XII intact, no focal neurological deficits   The results of significant diagnostics from this hospitalization (including imaging, microbiology, ancillary and laboratory) are listed below for reference.    LAB RESULTS: Basic Metabolic Panel:  Recent Labs Lab 05/16/14 1152  NA 140  K 4.0  CL 105  CO2 20  GLUCOSE 89  BUN 9  CREATININE 0.96  CALCIUM 8.5   Liver Function Tests:  Recent Labs Lab 05/16/14 1152  AST 13  ALT 10  ALKPHOS 84  BILITOT 0.3  PROT 6.5  ALBUMIN 3.0*   No results found for this basename: LIPASE, AMYLASE,  in the last 168 hours No results found for this basename: AMMONIA,  in  the last 168 hours CBC:  Recent Labs Lab 05/16/14 1152  WBC 7.0  NEUTROABS 3.3  HGB 14.5  HCT 43.7  MCV 84.5  PLT 229   Cardiac Enzymes:  Recent Labs Lab 05/17/14 0330 05/17/14 0915  TROPONINI <0.30 <0.30   BNP: No components found with this basename: POCBNP,  CBG: No results found for this basename: GLUCAP,  in the last 168 hours  Significant Diagnostic Studies:  Dg Chest 2 View  05/16/2014   CLINICAL DATA:  Chest pressure, left shoulder pain, shortness of breath  EXAM: CHEST  2 VIEW  COMPARISON:  01/13/2014  FINDINGS: Stable medial left apical lucency compatible with bullous formation. Normal heart size and vascularity. Lungs remain otherwise clear. No focal pneumonia, collapse or consolidation. No effusion, edema or pneumothorax. Trachea midline.  IMPRESSION: Left apical bullous disease.  Stable exam without superimposed acute process.   Electronically Signed   By: Daryll Brod M.D.   On: 05/16/2014 11:22   Ct Head Wo Contrast  05/16/2014  CLINICAL DATA:  R sided weakness SHORTNESS OF BREATH  EXAM: CT HEAD WITHOUT CONTRAST  TECHNIQUE: Contiguous axial images were obtained from the base of the skull through the vertex without intravenous contrast.  COMPARISON:  02/08/2011  FINDINGS: There is no evidence of acute intracranial hemorrhage, brain edema, mass lesion, acute infarction, mass effect, or midline shift. Acute infarct may be inapparent on noncontrast CT. No other intra-axial abnormalities are seen, and the ventricles and sulci are within normal limits in size and symmetry. No abnormal extra-axial fluid collections or masses are identified. No significant calvarial abnormality.  IMPRESSION: 1. Negative for bleed or other acute intracranial process.   Electronically Signed   By: Arne Cleveland M.D.   On: 05/16/2014 14:18    2D ECHO: Study Conclusions  - Left ventricle: The cavity size was normal. There was moderate concentric hypertrophy. Systolic function was normal.  The estimated ejection fraction was in the range of 55% to 60%. Wall motion was normal; there were no regional wall motion abnormalities   Disposition and Follow-up: Discharge Instructions   Diet - low sodium heart healthy    Complete by:  As directed      Increase activity slowly    Complete by:  As directed             DISPOSITION: Home  DIET: Heart healthy     DISCHARGE FOLLOW-UP Follow-up Information   Follow up with Xu,Jindong, MD In 4 weeks. (stroke f/u)    Specialty:  Neurology   Contact information:   [address] [place] [address] [phone]       Schedule an appointment as soon as possible for a visit with [facility]    . ( )    Contact information:   [place] [address] [phone]      Follow up with [facility]. Librarian, academic)    Contact information:   [address] [place] [address] [phone]       Follow up with [facility]. (Physical Therapy and Occupational)    Specialty:  Home Health Services   Contact information:   [place] [address] [phone]       Time spent on Discharge: 40 mins  Signed:   Dedria Endres M.D. [facility] 05/19/2014, 2:09 PM Pager: [phone]   **Disclaimer: This note was dictated with voice recognition software. Similar sounding words can inadvertently be transcribed and this note may contain transcription errors which may not have been corrected upon publication of note.**

## 2014-06-01 ENCOUNTER — Observation Stay: Payer: Self-pay | Admitting: Internal Medicine

## 2014-06-01 LAB — CBC
HCT: 48.7 % — ABNORMAL HIGH (ref 35.0–47.0)
HGB: 15.7 g/dL (ref 12.0–16.0)
MCH: 28 pg (ref 26.0–34.0)
MCHC: 32.3 g/dL (ref 32.0–36.0)
MCV: 87 fL (ref 80–100)
Platelet: 284 10*3/uL (ref 150–440)
RBC: 5.61 10*6/uL — ABNORMAL HIGH (ref 3.80–5.20)
RDW: 15.1 % — AB (ref 11.5–14.5)
WBC: 8 10*3/uL (ref 3.6–11.0)

## 2014-06-01 LAB — URINALYSIS, COMPLETE
Bacteria: NONE SEEN
Bilirubin,UR: NEGATIVE
Glucose,UR: NEGATIVE mg/dL (ref 0–75)
KETONE: NEGATIVE
LEUKOCYTE ESTERASE: NEGATIVE
NITRITE: NEGATIVE
PH: 5 (ref 4.5–8.0)
Specific Gravity: 1.013 (ref 1.003–1.030)
WBC UR: 1 /HPF (ref 0–5)

## 2014-06-01 LAB — D-DIMER(ARMC): D-DIMER: 504 ng/mL

## 2014-06-01 LAB — BASIC METABOLIC PANEL
ANION GAP: 10 (ref 7–16)
BUN: 17 mg/dL (ref 7–18)
CREATININE: 1.4 mg/dL — AB (ref 0.60–1.30)
Calcium, Total: 8.7 mg/dL (ref 8.5–10.1)
Chloride: 102 mmol/L (ref 98–107)
Co2: 28 mmol/L (ref 21–32)
GFR CALC AF AMER: 52 — AB
GFR CALC NON AF AMER: 45 — AB
Glucose: 97 mg/dL (ref 65–99)
Osmolality: 281 (ref 275–301)
Potassium: 3.3 mmol/L — ABNORMAL LOW (ref 3.5–5.1)
Sodium: 140 mmol/L (ref 136–145)

## 2014-06-01 LAB — TROPONIN I
Troponin-I: <0.02 ng/mL
Troponin-I: <0.02 ng/mL

## 2014-06-08 ENCOUNTER — Emergency Department: Payer: Self-pay | Admitting: Emergency Medicine

## 2014-06-08 LAB — CBC
HCT: 44.1 % (ref 35.0–47.0)
HGB: 14.6 g/dL (ref 12.0–16.0)
MCH: 28.3 pg (ref 26.0–34.0)
MCHC: 33.1 g/dL (ref 32.0–36.0)
MCV: 86 fL (ref 80–100)
PLATELETS: 212 10*3/uL (ref 150–440)
RBC: 5.16 10*6/uL (ref 3.80–5.20)
RDW: 14.7 % — ABNORMAL HIGH (ref 11.5–14.5)
WBC: 7.1 10*3/uL (ref 3.6–11.0)

## 2014-06-08 LAB — BASIC METABOLIC PANEL
Anion Gap: 11 (ref 7–16)
BUN: 10 mg/dL (ref 7–18)
CALCIUM: 8.4 mg/dL — AB (ref 8.5–10.1)
CHLORIDE: 109 mmol/L — AB (ref 98–107)
CO2: 22 mmol/L (ref 21–32)
CREATININE: 1.16 mg/dL (ref 0.60–1.30)
EGFR (Non-African Amer.): 56 — ABNORMAL LOW
GLUCOSE: 95 mg/dL (ref 65–99)
OSMOLALITY: 282 (ref 275–301)
Potassium: 3.8 mmol/L (ref 3.5–5.1)
SODIUM: 142 mmol/L (ref 136–145)

## 2014-06-08 LAB — TROPONIN I: Troponin-I: <0.02 ng/mL

## 2014-06-14 ENCOUNTER — Emergency Department: Payer: Self-pay | Admitting: Emergency Medicine

## 2014-06-14 LAB — BASIC METABOLIC PANEL
ANION GAP: 10 (ref 7–16)
BUN: 13 mg/dL (ref 7–18)
CALCIUM: 8 mg/dL — AB (ref 8.5–10.1)
CHLORIDE: 111 mmol/L — AB (ref 98–107)
CREATININE: 1.18 mg/dL (ref 0.60–1.30)
Co2: 22 mmol/L (ref 21–32)
EGFR (Non-African Amer.): 55 — ABNORMAL LOW
Glucose: 99 mg/dL (ref 65–99)
OSMOLALITY: 285 (ref 275–301)
POTASSIUM: 3.6 mmol/L (ref 3.5–5.1)
Sodium: 143 mmol/L (ref 136–145)

## 2014-06-14 LAB — CBC WITH DIFFERENTIAL/PLATELET
BASOS ABS: 0.1 10*3/uL (ref 0.0–0.1)
BASOS PCT: 0.9 %
EOS ABS: 0.2 10*3/uL (ref 0.0–0.7)
Eosinophil %: 2.3 %
HCT: 41.7 % (ref 35.0–47.0)
HGB: 13.3 g/dL (ref 12.0–16.0)
Lymphocyte #: 2.8 10*3/uL (ref 1.0–3.6)
Lymphocyte %: 37.5 %
MCH: 27.8 pg (ref 26.0–34.0)
MCHC: 32 g/dL (ref 32.0–36.0)
MCV: 87 fL (ref 80–100)
MONOS PCT: 11.7 %
Monocyte #: 0.9 x10 3/mm (ref 0.2–0.9)
Neutrophil #: 3.6 10*3/uL (ref 1.4–6.5)
Neutrophil %: 47.6 %
Platelet: 205 10*3/uL (ref 150–440)
RBC: 4.8 10*6/uL (ref 3.80–5.20)
RDW: 15.1 % — AB (ref 11.5–14.5)
WBC: 7.6 10*3/uL (ref 3.6–11.0)

## 2014-06-14 LAB — TROPONIN I: Troponin-I: <0.02 ng/mL

## 2014-06-14 LAB — URINALYSIS, COMPLETE
Bacteria: NONE SEEN
Bilirubin,UR: NEGATIVE
GLUCOSE, UR: NEGATIVE mg/dL (ref 0–75)
Ketone: NEGATIVE
LEUKOCYTE ESTERASE: NEGATIVE
Nitrite: NEGATIVE
PH: 6 (ref 4.5–8.0)
RBC,UR: 7 /HPF (ref 0–5)
Specific Gravity: 1.012 (ref 1.003–1.030)
Squamous Epithelial: 3
WBC UR: 1 /HPF (ref 0–5)

## 2014-07-06 ENCOUNTER — Emergency Department: Payer: Self-pay | Admitting: Emergency Medicine

## 2014-07-06 LAB — CK TOTAL AND CKMB (NOT AT ARMC)
CK, TOTAL: 518 U/L — AB
CK-MB: 2.5 ng/mL (ref 0.5–3.6)

## 2014-07-06 LAB — CBC
HCT: 45.5 % (ref 35.0–47.0)
HGB: 14.5 g/dL (ref 12.0–16.0)
MCH: 27.4 pg (ref 26.0–34.0)
MCHC: 31.9 g/dL — ABNORMAL LOW (ref 32.0–36.0)
MCV: 86 fL (ref 80–100)
Platelet: 193 10*3/uL (ref 150–440)
RBC: 5.31 10*6/uL — ABNORMAL HIGH (ref 3.80–5.20)
RDW: 15 % — AB (ref 11.5–14.5)
WBC: 8.9 10*3/uL (ref 3.6–11.0)

## 2014-07-06 LAB — BASIC METABOLIC PANEL
Anion Gap: 11 (ref 7–16)
BUN: 14 mg/dL (ref 7–18)
CALCIUM: 8.1 mg/dL — AB (ref 8.5–10.1)
CREATININE: 1.08 mg/dL (ref 0.60–1.30)
Chloride: 106 mmol/L (ref 98–107)
Co2: 24 mmol/L (ref 21–32)
GLUCOSE: 96 mg/dL (ref 65–99)
OSMOLALITY: 282 (ref 275–301)
Potassium: 3.5 mmol/L (ref 3.5–5.1)
Sodium: 141 mmol/L (ref 136–145)

## 2014-07-06 LAB — PRO B NATRIURETIC PEPTIDE: B-Type Natriuretic Peptide: 18 pg/mL (ref 0–125)

## 2014-07-06 LAB — TROPONIN I: Troponin-I: <0.02 ng/mL

## 2014-08-18 ENCOUNTER — Emergency Department (HOSPITAL_COMMUNITY)
Admission: EM | Admit: 2014-08-18 | Discharge: 2014-08-18 | Disposition: A | Discharge status: Home / Self Care | Payer: Self-pay | Attending: Emergency Medicine | Admitting: Emergency Medicine

## 2014-08-18 ENCOUNTER — Emergency Department (HOSPITAL_COMMUNITY): Payer: Self-pay

## 2014-08-18 ENCOUNTER — Encounter (HOSPITAL_COMMUNITY): Payer: Self-pay | Admitting: Emergency Medicine

## 2014-08-18 DIAGNOSIS — Z79899 Other long term (current) drug therapy: Secondary | ICD-10-CM | POA: Insufficient documentation

## 2014-08-18 DIAGNOSIS — J441 Chronic obstructive pulmonary disease with (acute) exacerbation: Secondary | ICD-10-CM | POA: Insufficient documentation

## 2014-08-18 DIAGNOSIS — R059 Cough, unspecified: Secondary | ICD-10-CM

## 2014-08-18 DIAGNOSIS — I10 Essential (primary) hypertension: Secondary | ICD-10-CM | POA: Insufficient documentation

## 2014-08-18 DIAGNOSIS — E669 Obesity, unspecified: Secondary | ICD-10-CM | POA: Insufficient documentation

## 2014-08-18 DIAGNOSIS — Z7982 Long term (current) use of aspirin: Secondary | ICD-10-CM | POA: Insufficient documentation

## 2014-08-18 DIAGNOSIS — Z7952 Long term (current) use of systemic steroids: Secondary | ICD-10-CM | POA: Insufficient documentation

## 2014-08-18 DIAGNOSIS — R51 Headache: Secondary | ICD-10-CM | POA: Insufficient documentation

## 2014-08-18 DIAGNOSIS — R05 Cough: Secondary | ICD-10-CM

## 2014-08-18 DIAGNOSIS — J440 Chronic obstructive pulmonary disease with acute lower respiratory infection: Secondary | ICD-10-CM

## 2014-08-18 DIAGNOSIS — Z8673 Personal history of transient ischemic attack (TIA), and cerebral infarction without residual deficits: Secondary | ICD-10-CM | POA: Insufficient documentation

## 2014-08-18 DIAGNOSIS — F329 Major depressive disorder, single episode, unspecified: Secondary | ICD-10-CM | POA: Insufficient documentation

## 2014-08-18 DIAGNOSIS — J209 Acute bronchitis, unspecified: Secondary | ICD-10-CM

## 2014-08-18 DIAGNOSIS — Z862 Personal history of diseases of the blood and blood-forming organs and certain disorders involving the immune mechanism: Secondary | ICD-10-CM | POA: Insufficient documentation

## 2014-08-18 DIAGNOSIS — R1031 Right lower quadrant pain: Secondary | ICD-10-CM | POA: Insufficient documentation

## 2014-08-18 HISTORY — DX: Cerebral infarction, unspecified: I63.9

## 2014-08-18 LAB — CBC WITH DIFFERENTIAL/PLATELET
BASOS PCT: 0 % (ref 0–1)
Basophils Absolute: 0 10*3/uL (ref 0.0–0.1)
EOS ABS: 0.3 10*3/uL (ref 0.0–0.7)
Eosinophils Relative: 3 % (ref 0–5)
HCT: 41.9 % (ref 36.0–46.0)
HEMOGLOBIN: 14.5 g/dL (ref 12.0–15.0)
LYMPHS ABS: 3.2 10*3/uL (ref 0.7–4.0)
Lymphocytes Relative: 38 % (ref 12–46)
MCH: 28.5 pg (ref 26.0–34.0)
MCHC: 34.6 g/dL (ref 30.0–36.0)
MCV: 82.5 fL (ref 78.0–100.0)
MONOS PCT: 10 % (ref 3–12)
Monocytes Absolute: 0.8 10*3/uL (ref 0.1–1.0)
Neutro Abs: 4.1 10*3/uL (ref 1.7–7.7)
Neutrophils Relative %: 49 % (ref 43–77)
Platelets: 266 10*3/uL (ref 150–400)
RBC: 5.08 MIL/uL (ref 3.87–5.11)
RDW: 14.9 % (ref 11.5–15.5)
WBC: 8.5 10*3/uL (ref 4.0–10.5)

## 2014-08-18 LAB — COMPREHENSIVE METABOLIC PANEL
ALT: 13 U/L (ref 0–35)
AST: 17 U/L (ref 0–37)
Albumin: 3.7 g/dL (ref 3.5–5.2)
Alkaline Phosphatase: 112 U/L (ref 39–117)
Anion gap: 15 (ref 5–15)
BILIRUBIN TOTAL: 0.2 mg/dL — AB (ref 0.3–1.2)
BUN: 17 mg/dL (ref 6–23)
CHLORIDE: 103 meq/L (ref 96–112)
CO2: 22 mEq/L (ref 19–32)
Calcium: 9.6 mg/dL (ref 8.4–10.5)
Creatinine, Ser: 0.97 mg/dL (ref 0.50–1.10)
GFR calc Af Amer: 80 mL/min — ABNORMAL LOW (ref >90)
GFR calc non Af Amer: 69 mL/min — ABNORMAL LOW (ref >90)
Glucose, Bld: 99 mg/dL (ref 70–99)
Potassium: 4.2 mEq/L (ref 3.7–5.3)
SODIUM: 140 meq/L (ref 137–147)
TOTAL PROTEIN: 8 g/dL (ref 6.0–8.3)

## 2014-08-18 LAB — URINALYSIS, ROUTINE W REFLEX MICROSCOPIC
BILIRUBIN URINE: NEGATIVE
Glucose, UA: NEGATIVE mg/dL
KETONES UR: NEGATIVE mg/dL
Leukocytes, UA: NEGATIVE
Nitrite: NEGATIVE
PROTEIN: 30 mg/dL — AB
Specific Gravity, Urine: 1.02 (ref 1.005–1.030)
Urobilinogen, UA: 0.2 mg/dL (ref 0.0–1.0)
pH: 5.5 (ref 5.0–8.0)

## 2014-08-18 LAB — URINE MICROSCOPIC-ADD ON

## 2014-08-18 LAB — LIPASE, BLOOD: Lipase: 28 U/L (ref 11–59)

## 2014-08-18 MED ORDER — SODIUM CHLORIDE 0.9 % IV SOLN
INTRAVENOUS | Status: DC
  Administered 2014-08-18: 17:00:00 via INTRAVENOUS

## 2014-08-18 MED ORDER — SODIUM CHLORIDE 0.9 % IV BOLUS (SEPSIS)
250.0000 mL | Freq: Once | INTRAVENOUS | Status: AC
  Administered 2014-08-18: 16:00:00 via INTRAVENOUS

## 2014-08-18 MED ORDER — ALBUTEROL SULFATE HFA 108 (90 BASE) MCG/ACT IN AERS
2.0000 | INHALATION_SPRAY | Freq: Four times a day (QID) | RESPIRATORY_TRACT | Status: DC
  Filled 2014-08-18: qty 6.7

## 2014-08-18 MED ORDER — LISINOPRIL 20 MG PO TABS: 20.0000 mg | ORAL_TABLET | Freq: Every day | ORAL | Status: DC

## 2014-08-18 MED ORDER — PREDNISONE 10 MG PO TABS: 40.0000 mg | ORAL_TABLET | Freq: Every day | ORAL | Status: DC

## 2014-08-18 MED ORDER — HYDROMORPHONE HCL 1 MG/ML IJ SOLN
1.0000 mg | Freq: Once | INTRAMUSCULAR | Status: AC
  Administered 2014-08-18: 1 mg via INTRAVENOUS
  Filled 2014-08-18: qty 1

## 2014-08-18 MED ORDER — METHYLPREDNISOLONE SODIUM SUCC 125 MG IJ SOLR
125.0000 mg | Freq: Once | INTRAMUSCULAR | Status: AC
  Administered 2014-08-18: 125 mg via INTRAVENOUS
  Filled 2014-08-18: qty 2

## 2014-08-18 MED ORDER — IPRATROPIUM-ALBUTEROL 0.5-2.5 (3) MG/3ML IN SOLN
3.0000 mL | Freq: Once | RESPIRATORY_TRACT | Status: AC
  Administered 2014-08-18: 3 mL via RESPIRATORY_TRACT
  Filled 2014-08-18: qty 3

## 2014-08-18 MED ORDER — ALBUTEROL SULFATE (2.5 MG/3ML) 0.083% IN NEBU
2.5000 mg | INHALATION_SOLUTION | Freq: Once | RESPIRATORY_TRACT | Status: AC
  Administered 2014-08-18: 2.5 mg via RESPIRATORY_TRACT
  Filled 2014-08-18: qty 3

## 2014-08-18 MED ORDER — IOHEXOL 300 MG/ML  SOLN
100.0000 mL | Freq: Once | INTRAMUSCULAR | Status: AC | PRN
  Administered 2014-08-18: 100 mL via INTRAVENOUS

## 2014-08-18 MED ORDER — ONDANSETRON HCL 4 MG/2ML IJ SOLN
4.0000 mg | Freq: Once | INTRAMUSCULAR | Status: AC
  Administered 2014-08-18: 4 mg via INTRAVENOUS
  Filled 2014-08-18: qty 2

## 2014-08-18 NOTE — ED Notes (Signed)
Pt states that she "was spitting up blood last night."

## 2014-08-18 NOTE — ED Provider Notes (Addendum)
CSN: [id number]     Arrival date & time 08/18/14  1213 History  This chart was scribed for Fredia Sorrow, MD by Rayfield Citizen, ED Scribe. This patient was seen in room [facility]14[facility]14 and the patient's care was started at 3:00 PM.    Chief Complaint  Patient presents with  . Asthma   Patient is a 48 y.o. female presenting with asthma. The history is provided by the patient. No language interpreter was used.  Asthma This is a chronic problem. The current episode started more than 2 days ago. Associated symptoms include chest pain, abdominal pain, headaches and shortness of breath.    HPI Comments: Taylor Vazquez is a 48 y.o. female with past medical history of asthma who presents to the Emergency Department complaining of cough and abdominal pain. She has been experiencing hemoptysis for the past two days (small amounts of blood, no mucous). She also reports 3 days of wheezing. Patient explains she has had a cough for the past three months.   She also reports RLQ abdominal pain; she describes this pain as a waxing and waning, stabbing pain. She reports nausea and vomiting. She has been managing her pain with Tylenol ("a whole bottle of Tylenol in 3 day").   Patient reports that she had a stroke 7/26; she improved after but experienced pain soon after. She returned to the hospital and was told that her lungs had "serious damage." She states that this did not surprise her based on her history of drug use. At this time, she was also told she had an enlarged organ   Patient is inquiring about refills for her medications: she takes Lisinopril 1x per day and would like this refilled. She takes aspirin. She denies blood thinners.    Past Medical History  Diagnosis Date  . Sickle cell trait   . Hypertension   . Asthma   . Depression   . Obesity   . Stroke    Past Surgical History  Procedure Laterality Date  . No past surgeries     Family History  Problem Relation Age of Onset  . Other  Neg Hx    History  Substance Use Topics  . Smoking status: Current Every Day Smoker -- 1.00 packs/day    Types: Cigarettes  . Smokeless tobacco: Never Used  . Alcohol Use: Yes     Comment: occ   OB History   Grav Para Term Preterm Abortions TAB SAB Ect Mult Living   8 5  5 1 1    4      Review of Systems  Constitutional: Positive for fever and chills.  HENT: Negative for rhinorrhea and sore throat.   Eyes: Positive for visual disturbance.  Respiratory: Positive for cough, shortness of breath and wheezing.   Cardiovascular: Positive for chest pain. Negative for leg swelling.  Gastrointestinal: Positive for nausea, vomiting and abdominal pain. Negative for diarrhea.  Genitourinary: Negative for dysuria, hematuria and difficulty urinating.  Musculoskeletal: Positive for back pain.  Skin: Negative for rash.  Neurological: Positive for headaches.  Hematological: Does not bruise/bleed easily.  Psychiatric/Behavioral: Negative for confusion.      Allergies  Bee venom; Shellfish allergy; and Eggs or egg-derived products  Home Medications   Prior to Admission medications   Medication Sig Start Date End Date Taking? Authorizing Provider  amLODipine (NORVASC) 5 MG tablet Take 5 mg by mouth daily.   Yes Historical Provider, MD  aspirin 325 MG tablet Take 1 tablet (325 mg total) by  mouth daily. 05/19/14  Yes Ripudeep Krystal Eaton, MD  atorvastatin (LIPITOR) 20 MG tablet Take 20 mg by mouth daily.   Yes Historical Provider, MD  acetaminophen (TYLENOL) 500 MG tablet Take 1,000 mg by mouth every 6 (six) hours as needed for mild pain.    Historical Provider, MD  albuterol (PROVENTIL HFA;VENTOLIN HFA) 108 (90 BASE) MCG/ACT inhaler Inhale 2 puffs into the lungs every 4 (four) hours as needed for wheezing or shortness of breath.    Historical Provider, MD  lisinopril (PRINIVIL,ZESTRIL) 20 MG tablet Take 40 mg by mouth 2 (two) times daily.    Historical Provider, MD  lisinopril (PRINIVIL,ZESTRIL) 20  MG tablet Take 1 tablet (20 mg total) by mouth daily. 08/18/14   Fredia Sorrow, MD  predniSONE (DELTASONE) 10 MG tablet Take 4 tablets (40 mg total) by mouth daily. 08/18/14   Fredia Sorrow, MD   BP 121/99  Pulse 85  Temp(Src) 98.7 F (37.1 C) (Oral)  Resp 15  SpO2 99%  LMP 06/23/2014 Physical Exam  Nursing note and vitals reviewed. Constitutional: She is oriented to person, place, and time. She appears well-developed and well-nourished.  HENT:  Head: Normocephalic and atraumatic.  Mouth/Throat: Oropharynx is clear and moist. No oropharyngeal exudate.  Eyes: EOM are normal. Pupils are equal, round, and reactive to light.  Cardiovascular: Normal rate, regular rhythm and normal heart sounds.  Exam reveals no gallop and no friction rub.   No murmur heard. Pulmonary/Chest: Effort normal and breath sounds normal. No respiratory distress. She has no wheezes. She has no rales.  Abdominal: Soft. Bowel sounds are normal. There is tenderness (Tender over liver and RLQ). There is guarding (RLQ). There is no rebound.  Musculoskeletal: Normal range of motion. She exhibits no edema.  Neurological: She is alert and oriented to person, place, and time.  Skin: Skin is warm and dry. No rash noted.  Psychiatric: She has a normal mood and affect. Her behavior is normal.    ED Course  Procedures   DIAGNOSTIC STUDIES: Oxygen Saturation is 97% on RA, low by my interpretation.    COORDINATION OF CARE: 3:10 PM Discussed treatment plan with pt at bedside and pt agreed to plan.  Results for orders placed during the hospital encounter of 08/18/14  COMPREHENSIVE METABOLIC PANEL      Result Value Ref Range   Sodium 140  137 - 147 mEq/L   Potassium 4.2  3.7 - 5.3 mEq/L   Chloride 103  96 - 112 mEq/L   CO2 22  19 - 32 mEq/L   Glucose, Bld 99  70 - 99 mg/dL   BUN 17  6 - 23 mg/dL   Creatinine, Ser 0.97  0.50 - 1.10 mg/dL   Calcium 9.6  8.4 - 10.5 mg/dL   Total Protein 8.0  6.0 - 8.3 g/dL   Albumin  3.7  3.5 - 5.2 g/dL   AST 17  0 - 37 U/L   ALT 13  0 - 35 U/L   Alkaline Phosphatase 112  39 - 117 U/L   Total Bilirubin 0.2 (*) 0.3 - 1.2 mg/dL   GFR calc non Af Amer 69 (*) >90 mL/min   GFR calc Af Amer 80 (*) >90 mL/min   Anion gap 15  5 - 15  LIPASE, BLOOD      Result Value Ref Range   Lipase 28  11 - 59 U/L  CBC WITH DIFFERENTIAL      Result Value Ref Range   WBC  8.5  4.0 - 10.5 K/uL   RBC 5.08  3.87 - 5.11 MIL/uL   Hemoglobin 14.5  12.0 - 15.0 g/dL   HCT 41.9  36.0 - 46.0 %   MCV 82.5  78.0 - 100.0 fL   MCH 28.5  26.0 - 34.0 pg   MCHC 34.6  30.0 - 36.0 g/dL   RDW 14.9  11.5 - 15.5 %   Platelets 266  150 - 400 K/uL   Neutrophils Relative % 49  43 - 77 %   Neutro Abs 4.1  1.7 - 7.7 K/uL   Lymphocytes Relative 38  12 - 46 %   Lymphs Abs 3.2  0.7 - 4.0 K/uL   Monocytes Relative 10  3 - 12 %   Monocytes Absolute 0.8  0.1 - 1.0 K/uL   Eosinophils Relative 3  0 - 5 %   Eosinophils Absolute 0.3  0.0 - 0.7 K/uL   Basophils Relative 0  0 - 1 %   Basophils Absolute 0.0  0.0 - 0.1 K/uL  URINALYSIS, ROUTINE W REFLEX MICROSCOPIC      Result Value Ref Range   Color, Urine YELLOW  YELLOW   APPearance CLEAR  CLEAR   Specific Gravity, Urine 1.020  1.005 - 1.030   pH 5.5  5.0 - 8.0   Glucose, UA NEGATIVE  NEGATIVE mg/dL   Hgb urine dipstick MODERATE (*) NEGATIVE   Bilirubin Urine NEGATIVE  NEGATIVE   Ketones, ur NEGATIVE  NEGATIVE mg/dL   Protein, ur 30 (*) NEGATIVE mg/dL   Urobilinogen, UA 0.2  0.0 - 1.0 mg/dL   Nitrite NEGATIVE  NEGATIVE   Leukocytes, UA NEGATIVE  NEGATIVE  URINE MICROSCOPIC-ADD ON      Result Value Ref Range   Squamous Epithelial / LPF FEW (*) RARE   WBC, UA 0-2  <3 WBC/hpf   RBC / HPF 3-6  <3 RBC/hpf   Bacteria, UA RARE  RARE   Dg Chest 2 View  08/18/2014   CLINICAL DATA:  Neck and cough for 3 months along with right side chest pain. Coughing up blood since last night. History of asthma and hypertension. Smoker.  EXAM: CHEST  2 VIEW  COMPARISON:   05/16/2014  FINDINGS: The heart size and mediastinal contours are within normal limits. Both lungs are clear. The visualized skeletal structures are unremarkable.  IMPRESSION: No active cardiopulmonary disease.   Electronically Signed   By: Rolm Baptise M.D.   On: 08/18/2014 14:43    Medications  0.9 %  sodium chloride infusion ( Intravenous Stopped 08/18/14 1744)  albuterol (PROVENTIL HFA;VENTOLIN HFA) 108 (90 BASE) MCG/ACT inhaler 2 puff (not administered)  ipratropium-albuterol (DUONEB) 0.5-2.5 (3) MG/3ML nebulizer solution 3 mL (3 mLs Nebulization Given 08/18/14 1332)  albuterol (PROVENTIL) (2.5 MG/3ML) 0.083% nebulizer solution 2.5 mg (2.5 mg Nebulization Given 08/18/14 1331)  sodium chloride 0.9 % bolus 250 mL (0 mLs Intravenous Stopped 08/18/14 1715)  ondansetron (ZOFRAN) injection 4 mg (4 mg Intravenous Given 08/18/14 1559)  HYDROmorphone (DILAUDID) injection 1 mg (1 mg Intravenous Given 08/18/14 1559)  methylPREDNISolone sodium succinate (SOLU-MEDROL) 125 mg/2 mL injection 125 mg (125 mg Intravenous Given 08/18/14 1559)  iohexol (OMNIPAQUE) 300 MG/ML solution 100 mL (100 mLs Intravenous Contrast Given 08/18/14 1615)     Labs Review Labs Reviewed  COMPREHENSIVE METABOLIC PANEL - Abnormal; Notable for the following:    Total Bilirubin 0.2 (*)    GFR calc non Af Amer 69 (*)    GFR calc Af Amer 80 (*)  All other components within normal limits  URINALYSIS, ROUTINE W REFLEX MICROSCOPIC - Abnormal; Notable for the following:    Hgb urine dipstick MODERATE (*)    Protein, ur 30 (*)    All other components within normal limits  URINE MICROSCOPIC-ADD ON - Abnormal; Notable for the following:    Squamous Epithelial / LPF FEW (*)    All other components within normal limits  LIPASE, BLOOD  CBC WITH DIFFERENTIAL    Imaging Review Dg Chest 2 View  08/18/2014   CLINICAL DATA:  Neck and cough for 3 months along with right side chest pain. Coughing up blood since last night. History  of asthma and hypertension. Smoker.  EXAM: CHEST  2 VIEW  COMPARISON:  05/16/2014  FINDINGS: The heart size and mediastinal contours are within normal limits. Both lungs are clear. The visualized skeletal structures are unremarkable.  IMPRESSION: No active cardiopulmonary disease.   Electronically Signed   By: Rolm Baptise M.D.   On: 08/18/2014 14:43   Ct Abdomen Pelvis W Contrast  08/18/2014   CLINICAL DATA:  Lower quadrant abdominal pain for 4 days. Reportedly there is chronic underlying pain which increases in the severity. Cough for the past 3 months. Sickle cell trait. Asthma.  EXAM: CT ABDOMEN AND PELVIS WITH CONTRAST  TECHNIQUE: Multidetector CT imaging of the abdomen and pelvis was performed using the standard protocol following bolus administration of intravenous contrast.  CONTRAST:  161mL OMNIPAQUE IOHEXOL 300 MG/ML  SOLN  COMPARISON:  08/31/2013  FINDINGS: Lower chest:  Unremarkable  Hepatobiliary: Unremarkable  Pancreas: Unremarkable  Spleen: Unremarkable  Adrenals/Urinary Tract: Unremarkable  Stomach/Bowel: Unremarkable  Vascular/Lymphatic: Unremarkable  Reproductive: 2.5 by 2.0 cm hypodense lesion of the left ovary, probably a cyst. Smaller right ovarian cyst. Exophytic enhancing lesions of the uterus are likely could fibroids, with a fundal subserosal fibroid measuring 2.5 cm in diameter. No endometrial thickening.  Other: No supplemental non-categorized findings.  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 Disc bulge at L5-S1 and which along with facet arthropathy probably causes bilateral foraminal stenosis.  IMPRESSION: 1. A specific cause of the left lower quadrant abdominal pain is not observed. The patient does have a 2.5 cm left ovarian cyst. 2. Uterine fibroids. 3. Cannot exclude Bertolotti syndrome on the left. 4. Disc bulge at L5-S1  potentially causing bilateral foraminal impingement.   Electronically Signed   By: Sherryl Barters M.D.   On: 08/18/2014 16:48     EKG Interpretation None      MDM   Final diagnoses:  Cough  RLQ abdominal pain  COPD with acute bronchitis    Patient with 2 complaints. One is an asthma flare which is probably COPD related. Patient's been short of breath for 2 days. Does not have an albuterol inhaler. Patient was wheezing upon arrival here was given albuterol Atrovent nebulizer with resolution of the wheezing. Patient also given her 25 mg Solu-Medrol. Patient will be continued on prednisone. Patient given inhaler to go. Patient also historically mentioned a fever up to 102 but temp Is normal. The right lower quadrant abdominal pain CT scan negative for an explanation for that pain. Patient perhaps with a left ovarian cyst but patient's pain and tenderness is on the right side. Patient given resource guide help her find a regular doctor in the area she's new to this area. No evidence of pneumonia on chest x-ray.  Patient's room air sats were always in the upper 90s. Wheezing all resolved. Patient nontoxic no acute distress.  I personally performed the services described in this documentation, which was scribed in my presence. The recorded information has been reviewed and is accurate.       Fredia Sorrow, MD 08/18/14 [place], MD 08/18/14 1758

## 2014-08-18 NOTE — ED Notes (Signed)
Asthma flare for 2 days,  Pain rt mid abd.  N/V,  No diarrhea.  T 102 last night

## 2014-08-18 NOTE — Discharge Instructions (Signed)
Procedure albuterol inhaler 2 puffs every 6 hours for the next 7 days. Take the prednisone as directed for the next 5 days. We also renewed your hypertensive medicine Lisinopril. Resource guide provided below to help define a regular doctor.   Emergency Department Resource Guide 1) Find a Doctor and Pay Out of Pocket Although you won't have to find out who is covered by your insurance plan, it is a good idea to ask around and get recommendations. You will then need to call the office and see if the doctor you have chosen will accept you as a new patient and what types of options they offer for patients who are self-pay. Some doctors offer discounts or will set up payment plans for their patients who do not have insurance, but you will need to ask so you aren't surprised when you get to your appointment.  2) Contact Your Local Health Department Not all health departments have doctors that can see patients for sick visits, but many do, so it is worth a call to see if yours does. If you don't know where your local health department is, you can check in your phone book. The CDC also has a tool to help you locate your state's health department, and many state websites also have listings of all of their local health departments.  3) Find a [facility] If your illness is not likely to be very severe or complicated, you may want to try a walk in clinic. These are popping up all over the country in pharmacies, drugstores, and shopping centers. They're usually staffed by nurse practitioners or physician assistants that have been trained to treat common illnesses and complaints. They're usually fairly quick and inexpensive. However, if you have serious medical issues or chronic medical problems, these are probably not your best option.  No Primary Care Doctor: - Call Health Connect at  [phone] - they can help you locate a primary care doctor that  accepts your insurance, provides certain services,  etc. - Physician Referral Service- [phone]  Chronic Pain Problems: Organization         Address  Phone   Notes  [facility]  [phone] Patients need to be referred by their primary care doctor.   Medication Assistance: Organization         Address  Phone   Notes  [facility] [address] [place], [place] [address] [phone] --Must be a resident of [facility] -- Must have NO insurance coverage whatsoever (no Medicaid/ Medicare, etc.) -- The pt. MUST have a primary care doctor that directs their care regularly and follows them in the community   Med[facility]ist  [phone]   [employer]  [phone]    Agencies that provide inexpensive medical care: Organization         Address  Phone   Notes  [place]  [phone]   [facility]    [phone]   [facility] [place], [place] [address] [phone]   [place] [address] [place] [phone]   Planned Parenthood    [phone]   [facility]    [phone]   [place] and [place] [address], [place] Phone:  [phone], Fax:  [phone] Hours of Operation:  9 am - 6 pm, M-F.  Also accepts Medicaid/Medicare and  self-pay.  [facility] for [place], [address], [place] Phone: [phone], Fax: [phone]. Hours of Operation:  8:30 am - 5:30 pm, M-F.  Also accepts Medicaid and self-pay.  [facility] [place] [address] [place] Phone: [phone]   [place], [place], [place] [phone], Ext. [phone] Mondays & Thursdays: 7-9 AM.  First 15 patients are seen on a first come, first serve basis.    [place] Providers:  Organization         Address  Phone   Notes  [facility] [address], [place] [phone] Also accepts self-pay patients.  [facility] [id number] [place], [place]  [phone]   [place], [address], [place] [phone]   [facility] [address], [place] [phone]   Lucianne Lei [address], [place]   [phone] Only accepts [place] Access [place] patients after they have their name applied to their card.   Self-Pay (no insurance) in [facility]:  Organization         Address  Phone   Notes  Sickle Cell Patients, [facility] [place] [phone]   [facility] [place] [phone]   [facility] [place]  [place], [place], [place] [phone]   [facility]/Dr. Osei-Bonsu  [address], [place] or [address], [place] [phone] Phone number for both [place] and [place] locations is the same.  Urgent Medical and [facility] [place] [address] [place] [phone]   [facility] [address], [place] or [address] [phone]   [facility] [address] [place] [phone], phone; [phone], fax Sees patients 1st and 3rd Saturday of every month.  Must not qualify for public or private insurance (i.e. Medicaid, Medicare, [place] Health Choice, Veterans' Benefits)  Household income should be no more than 200% of the poverty level The clinic cannot treat you if you are pregnant or think you are pregnant  Sexually transmitted diseases are not treated at the clinic.    Dental Care: Organization         Address  Phone  Notes  [facility] [place] [phone] Accepts children up to  age 32 who are enrolled in [place] or [place]; pregnant women with a Medicaid card; and children who have applied for Medicaid or [place] Health Choice, but were declined, whose parents can pay a reduced fee at time of service.  [facility] Department of [facility]  [address], [place] [phone] Accepts children up to age 69 who are enrolled in [place] or [place]; pregnant women with a Medicaid card; and children who have applied for Medicaid or  Health Choice, but were declined, whose parents can pay a reduced fee at time of service.  [facility] Adult Dental Access PROGRAM  [place] [phone] Patients are seen by appointment only. Walk-ins are not accepted. [place] will see patients 72 years of age and older. Monday - Tuesday (8am-5pm) Most Wednesdays (8:30-5pm) $  30 per visit, cash only  [facility] Adult [employer] PROGRAM  [address], [facility] [phone] Patients are seen by appointment only. Walk-ins are not accepted. [place] will see patients 41 years of age and older. One Wednesday Evening (Monthly: Volunteer Based).  $30 per visit, cash only  [place]  [phone] for adults; Children under age 58, call Graduate Pediatric Dentistry at [phone]. Children aged 52-14, please call [phone] to request a pediatric application.  Dental services are provided in all areas of dental care including fillings, crowns and bridges, complete and partial dentures, implants, gum treatment, root canals, and extractions. Preventive care is also provided. Treatment is provided to both adults and children. Patients are selected via a lottery and there is often a waiting list.   [facility] [address], [place]  [phone] [email or web address]   [facility] [address] [place], [place] [phone], Ext. [phone] Second and Fourth Thursday of  each month, opens at 6:30 AM; Clinic ends at 9 AM.  Patients are seen on a first-come first-served basis, and a limited number are seen during each clinic.   [facility]  [address] Hillard Danker [place], [place] [phone]   Eligibility Requirements You must have lived in [place], [place], or [place] counties for at least the last three months.   You cannot be eligible for state or federal sponsored [employer], including [employer], [place], or [employer].   You generally cannot be eligible for healthcare insurance through your employer.    How to apply: Eligibility screenings are held every Tuesday and Wednesday afternoon from 1:00 pm until 4:00 pm. You do not need an appointment for the interview!  [facility] [address] [place], [place]   [place]  [place] Department  [place]  [phone]    Behavioral Health Resources in the Community: Intensive Outpatient Programs Organization         Address  Phone  Notes  [place]. [address], [place], [place] [phone]   [facility] [address] [place], [place]   ADS: [facility] [address] [place], [place]   [place] [address],  [place], [place] or [phone]   Substance Abuse Resources Organization         Address  Phone  Notes  Alcohol and Drug Services  [phone]   [place]  [phone]   [place]   Chinita Pester  [phone]   Residential & Outpatient Substance Abuse Program  [phone]   Psychological Services Organization         Address  Phone  Notes   [facility] [place]  [place]  [phone]   [place] [address]  [place] or [phone]    Mobile Crisis Teams Organization         Address  Phone  Notes  [facility], Mobile Crisis Care Unit  [phone]   [facility]  [address] [place], [place]   [place] Levels [address] [place] [phone]    Self-Help/Support Groups Organization         Address  Phone  Notes  [facility]ental [facility]oc[facility] of [place] - variety of support groups  [place] Call for more information  Narcotics Anonymous (NA), [facility]aring [facility] [address], [employer] [place]  2 meetings at this location   Special educational needs teacher         Address  Phone  Notes  [facility]AP Residential Treatment [place],    [place]  [phone]   [facility]  [address], [place] [phone], [place], [place]   [place], [place] [phone] Admissions: 8am-3pm M-F  Incentives Substance [place] [address],    [place], [place] [phone]   [facility] [address] [place], [place], [place]   [facility] [address]  [place], [place]   [facility] Programs - Intensive Outpatient [address], [place], [place]   [facility] ([place].) [place].,  [place], [place] [phone] or [phone]   [facility]idential Treatment Services (RTS) [address] [place], [place] Accepts Medicaid  [facility] [place] [address],  [place] [phone] Substance Abuse/Addiction Treatment   [facility] Organization         Address  Phone  Notes  [facility]  [phone]   Domenic Schwab, PhD [address] Arlis Porta [place], [place]   [phone] or [phone]   [place], [place] [phone]     [facility] [address], [place], [place] [phone] Insurance/Medicaid/sponsorship through [facility] and Families [address], [address]                                    [place], [place] [phone] [place] [address], [place] [phone]    Dr. Adele Schilder  [phone]   [facility] Dept. 1) [address], [place] 2) [place] 3)  [place], [place] [phone]  [phone]   [place] [phone] or [phone] (After Hours)

## 2014-08-18 NOTE — ED Notes (Signed)
RT paged for tx. Pt states she uses a nebulized but it is currently in storage.

## 2014-08-23 ENCOUNTER — Encounter (HOSPITAL_COMMUNITY): Payer: Self-pay | Admitting: Emergency Medicine

## 2014-08-30 ENCOUNTER — Emergency Department (HOSPITAL_COMMUNITY)
Admission: EM | Admit: 2014-08-30 | Discharge: 2014-08-30 | Disposition: A | Discharge status: Home / Self Care | Payer: Self-pay | Attending: Emergency Medicine | Admitting: Emergency Medicine

## 2014-08-30 ENCOUNTER — Emergency Department (HOSPITAL_COMMUNITY): Payer: Self-pay

## 2014-08-30 ENCOUNTER — Encounter (HOSPITAL_COMMUNITY): Payer: Self-pay | Admitting: Cardiology

## 2014-08-30 DIAGNOSIS — Z862 Personal history of diseases of the blood and blood-forming organs and certain disorders involving the immune mechanism: Secondary | ICD-10-CM | POA: Insufficient documentation

## 2014-08-30 DIAGNOSIS — Z7982 Long term (current) use of aspirin: Secondary | ICD-10-CM | POA: Insufficient documentation

## 2014-08-30 DIAGNOSIS — Y9289 Other specified places as the place of occurrence of the external cause: Secondary | ICD-10-CM | POA: Insufficient documentation

## 2014-08-30 DIAGNOSIS — Y9389 Activity, other specified: Secondary | ICD-10-CM | POA: Insufficient documentation

## 2014-08-30 DIAGNOSIS — Z720 Tobacco use: Secondary | ICD-10-CM | POA: Insufficient documentation

## 2014-08-30 DIAGNOSIS — M1712 Unilateral primary osteoarthritis, left knee: Secondary | ICD-10-CM | POA: Insufficient documentation

## 2014-08-30 DIAGNOSIS — Z7952 Long term (current) use of systemic steroids: Secondary | ICD-10-CM | POA: Insufficient documentation

## 2014-08-30 DIAGNOSIS — J45909 Unspecified asthma, uncomplicated: Secondary | ICD-10-CM | POA: Insufficient documentation

## 2014-08-30 DIAGNOSIS — S8002XA Contusion of left knee, initial encounter: Secondary | ICD-10-CM | POA: Insufficient documentation

## 2014-08-30 DIAGNOSIS — I10 Essential (primary) hypertension: Secondary | ICD-10-CM | POA: Insufficient documentation

## 2014-08-30 DIAGNOSIS — T1490XA Injury, unspecified, initial encounter: Secondary | ICD-10-CM

## 2014-08-30 DIAGNOSIS — Z8659 Personal history of other mental and behavioral disorders: Secondary | ICD-10-CM | POA: Insufficient documentation

## 2014-08-30 DIAGNOSIS — Y998 Other external cause status: Secondary | ICD-10-CM | POA: Insufficient documentation

## 2014-08-30 DIAGNOSIS — Z8673 Personal history of transient ischemic attack (TIA), and cerebral infarction without residual deficits: Secondary | ICD-10-CM | POA: Insufficient documentation

## 2014-08-30 DIAGNOSIS — Z79899 Other long term (current) drug therapy: Secondary | ICD-10-CM | POA: Insufficient documentation

## 2014-08-30 DIAGNOSIS — W01198A Fall on same level from slipping, tripping and stumbling with subsequent striking against other object, initial encounter: Secondary | ICD-10-CM | POA: Insufficient documentation

## 2014-08-30 DIAGNOSIS — E669 Obesity, unspecified: Secondary | ICD-10-CM | POA: Insufficient documentation

## 2014-08-30 MED ORDER — OXYCODONE-ACETAMINOPHEN 5-325 MG PO TABS: 1.0000 | ORAL_TABLET | ORAL | Status: DC | PRN

## 2014-08-30 MED ORDER — OXYCODONE HCL 5 MG PO TABS
5.0000 mg | ORAL_TABLET | Freq: Once | ORAL | Status: AC
  Administered 2014-08-30: 5 mg via ORAL
  Filled 2014-08-30: qty 1
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
## 2014-08-30 NOTE — Discharge Instructions (Signed)
Arthritis, Nonspecific Arthritis is pain, redness, warmth, or puffiness (inflammation) of a joint. The joint may be stiff or hurt when you move it. One or more joints may be affected. There are many types of arthritis. Your doctor may not know what type you have right away. The most common cause of arthritis is wear and tear on the joint (osteoarthritis). HOME CARE   Only take medicine as told by your doctor.  Rest the joint as much as possible.  Raise (elevate) your joint if it is puffy.  Use crutches if the painful joint is in your leg.  Drink enough fluids to keep your pee (urine) clear or pale yellow.  Follow your doctor's diet instructions.  Use cold packs for very bad joint pain for 10 to 15 minutes every hour. Ask your doctor if it is okay for you to use hot packs.  Exercise as told by your doctor.  Take a warm shower if you have stiffness in the morning.  Move your sore joints throughout the day. GET HELP RIGHT AWAY IF:   You have a fever.  You have very bad joint pain, puffiness, or redness.  You have many joints that are painful and puffy.  You are not getting better with treatment.  You have very bad back pain or leg weakness.  You cannot control when you poop (bowel movement) or pee (urinate).  You do not feel better in 24 hours or are getting worse.  You are having side effects from your medicine. MAKE SURE YOU:   Understand these instructions.  Will watch your condition.  Will get help right away if you are not doing well or get worse. Document Released: 01/02/2010 Document Revised: 04/08/2012 Document Reviewed: 01/02/2010 [facility] Patient Information 2015 [place], [place]. This information is not intended to replace advice given to you by your health care provider. Make sure you discuss any questions you have with your health care provider.  Contusion A contusion is a deep bruise. Contusions are the result of an injury that caused bleeding under the  skin. The contusion may turn blue, purple, or yellow. Minor injuries will give you a painless contusion, but more severe contusions may stay painful and swollen for a few weeks.  CAUSES  A contusion is usually caused by a blow, trauma, or direct force to an area of the body. SYMPTOMS   Swelling and redness of the injured area.  Bruising of the injured area.  Tenderness and soreness of the injured area.  Pain. DIAGNOSIS  The diagnosis can be made by taking a history and physical exam. An X-ray, CT scan, or MRI may be needed to determine if there were any associated injuries, such as fractures. TREATMENT  Specific treatment will depend on what area of the body was injured. In general, the best treatment for a contusion is resting, icing, elevating, and applying cold compresses to the injured area. Over-the-counter medicines may also be recommended for pain control. Ask your caregiver what the best treatment is for your contusion. HOME CARE INSTRUCTIONS  Apply a heating pad to your left knee 20 minutes 4 times daily.  Only take over-the-counter or prescription medicines for pain, discomfort, or fever as directed by your caregiver. Your caregiver may recommend avoiding anti-inflammatory medicines (aspirin, ibuprofen, and naproxen) for 48 hours because these medicines may increase bruising.  Rest the injured area.  If possible, elevate the injured area to reduce swelling. SEEK IMMEDIATE MEDICAL CARE IF:   You have increased bruising or swelling.  You have pain that is getting worse.  Your swelling or pain is not relieved with medicines. MAKE SURE YOU:   Understand these instructions.  Will watch your condition.  Will get help right away if you are not doing well or get worse. Document Released: 07/18/2005 Document Revised: 10/13/2013 Document Reviewed: 08/13/2011 [facility] Patient Information 2015 [place], [place]. This information is not intended to replace advice given to you by  your health care provider. Make sure you discuss any questions you have with your health care provider.  Wear the ace wrap and use crutches to help minimize weight bearing on your left knee.    You may take the oxycodone prescribed for pain relief.  This will make you drowsy - do not drive within 4 hours of taking this medication.  Use the ibuprofen also for inflammation.  Call the orthopedic doctor listed for a recheck of your injury in 1 week if your symptoms are not improving.

## 2014-09-29 ENCOUNTER — Emergency Department (HOSPITAL_COMMUNITY)
Admission: EM | Admit: 2014-09-29 | Discharge: 2014-09-29 | Disposition: A | Discharge status: Home / Self Care | Payer: Self-pay | Attending: Emergency Medicine | Admitting: Emergency Medicine

## 2014-09-29 ENCOUNTER — Emergency Department (HOSPITAL_COMMUNITY): Payer: Self-pay

## 2014-09-29 ENCOUNTER — Encounter (HOSPITAL_COMMUNITY): Payer: Self-pay | Admitting: *Deleted

## 2014-09-29 DIAGNOSIS — Z8673 Personal history of transient ischemic attack (TIA), and cerebral infarction without residual deficits: Secondary | ICD-10-CM | POA: Insufficient documentation

## 2014-09-29 DIAGNOSIS — Z8659 Personal history of other mental and behavioral disorders: Secondary | ICD-10-CM | POA: Insufficient documentation

## 2014-09-29 DIAGNOSIS — R05 Cough: Secondary | ICD-10-CM

## 2014-09-29 DIAGNOSIS — K088 Other specified disorders of teeth and supporting structures: Secondary | ICD-10-CM | POA: Insufficient documentation

## 2014-09-29 DIAGNOSIS — E669 Obesity, unspecified: Secondary | ICD-10-CM | POA: Insufficient documentation

## 2014-09-29 DIAGNOSIS — Z792 Long term (current) use of antibiotics: Secondary | ICD-10-CM | POA: Insufficient documentation

## 2014-09-29 DIAGNOSIS — R059 Cough, unspecified: Secondary | ICD-10-CM

## 2014-09-29 DIAGNOSIS — I10 Essential (primary) hypertension: Secondary | ICD-10-CM | POA: Insufficient documentation

## 2014-09-29 DIAGNOSIS — J441 Chronic obstructive pulmonary disease with (acute) exacerbation: Secondary | ICD-10-CM | POA: Insufficient documentation

## 2014-09-29 DIAGNOSIS — Z720 Tobacco use: Secondary | ICD-10-CM | POA: Insufficient documentation

## 2014-09-29 DIAGNOSIS — Z7982 Long term (current) use of aspirin: Secondary | ICD-10-CM | POA: Insufficient documentation

## 2014-09-29 DIAGNOSIS — K0889 Other specified disorders of teeth and supporting structures: Secondary | ICD-10-CM

## 2014-09-29 DIAGNOSIS — Z862 Personal history of diseases of the blood and blood-forming organs and certain disorders involving the immune mechanism: Secondary | ICD-10-CM | POA: Insufficient documentation

## 2014-09-29 DIAGNOSIS — Z79899 Other long term (current) drug therapy: Secondary | ICD-10-CM | POA: Insufficient documentation

## 2014-09-29 LAB — CBC WITH DIFFERENTIAL/PLATELET
BASOS ABS: 0 10*3/uL (ref 0.0–0.1)
Basophils Relative: 0 % (ref 0–1)
Eosinophils Absolute: 0 10*3/uL (ref 0.0–0.7)
Eosinophils Relative: 0 % (ref 0–5)
HCT: 43.6 % (ref 36.0–46.0)
HEMOGLOBIN: 14.5 g/dL (ref 12.0–15.0)
Lymphocytes Relative: 31 % (ref 12–46)
Lymphs Abs: 3 10*3/uL (ref 0.7–4.0)
MCH: 28.2 pg (ref 26.0–34.0)
MCHC: 33.3 g/dL (ref 30.0–36.0)
MCV: 84.7 fL (ref 78.0–100.0)
Monocytes Absolute: 1.1 10*3/uL — ABNORMAL HIGH (ref 0.1–1.0)
Monocytes Relative: 11 % (ref 3–12)
Neutro Abs: 5.7 10*3/uL (ref 1.7–7.7)
Neutrophils Relative %: 58 % (ref 43–77)
Platelets: 256 10*3/uL (ref 150–400)
RBC: 5.15 MIL/uL — ABNORMAL HIGH (ref 3.87–5.11)
RDW: 15.2 % (ref 11.5–15.5)
WBC: 9.9 10*3/uL (ref 4.0–10.5)

## 2014-09-29 LAB — BASIC METABOLIC PANEL
ANION GAP: 12 (ref 5–15)
BUN: 12 mg/dL (ref 6–23)
CALCIUM: 9.3 mg/dL (ref 8.4–10.5)
CHLORIDE: 104 meq/L (ref 96–112)
CO2: 24 meq/L (ref 19–32)
Creatinine, Ser: 1.08 mg/dL (ref 0.50–1.10)
GFR calc Af Amer: 70 mL/min — ABNORMAL LOW (ref >90)
GFR calc non Af Amer: 61 mL/min — ABNORMAL LOW (ref >90)
Glucose, Bld: 95 mg/dL (ref 70–99)
POTASSIUM: 4 meq/L (ref 3.7–5.3)
Sodium: 140 mEq/L (ref 137–147)

## 2014-09-29 LAB — TROPONIN I: Troponin I: <0.3 ng/mL (ref <0.30)

## 2014-09-29 MED ORDER — PREDNISONE 50 MG PO TABS
60.0000 mg | ORAL_TABLET | Freq: Once | ORAL | Status: AC
  Administered 2014-09-29: 60 mg via ORAL
  Filled 2014-09-29 (×2): qty 1

## 2014-09-29 MED ORDER — OXYCODONE-ACETAMINOPHEN 5-325 MG PO TABS
ORAL_TABLET | ORAL | Status: AC
  Filled 2014-09-29: qty 1

## 2014-09-29 MED ORDER — OXYCODONE-ACETAMINOPHEN 5-325 MG PO TABS: 1.0000 | ORAL_TABLET | ORAL | Status: DC | PRN

## 2014-09-29 MED ORDER — AMOXICILLIN 500 MG PO CAPS: 500.0000 mg | ORAL_CAPSULE | Freq: Three times a day (TID) | ORAL | Status: DC

## 2014-09-29 MED ORDER — BENZONATATE 100 MG PO CAPS: 100.0000 mg | ORAL_CAPSULE | Freq: Three times a day (TID) | ORAL | Status: DC

## 2014-09-29 MED ORDER — IPRATROPIUM-ALBUTEROL 0.5-2.5 (3) MG/3ML IN SOLN
3.0000 mL | Freq: Once | RESPIRATORY_TRACT | Status: AC
  Administered 2014-09-29: 3 mL via RESPIRATORY_TRACT
  Filled 2014-09-29: qty 3

## 2014-09-29 MED ORDER — BENZONATATE 100 MG PO CAPS
200.0000 mg | ORAL_CAPSULE | Freq: Once | ORAL | Status: AC
  Administered 2014-09-29: 200 mg via ORAL
  Filled 2014-09-29: qty 2

## 2014-09-29 MED ORDER — ALBUTEROL SULFATE (2.5 MG/3ML) 0.083% IN NEBU
2.5000 mg | INHALATION_SOLUTION | Freq: Once | RESPIRATORY_TRACT | Status: AC
  Administered 2014-09-29: 2.5 mg via RESPIRATORY_TRACT
  Filled 2014-09-29: qty 3

## 2014-09-29 MED ORDER — OXYCODONE-ACETAMINOPHEN 5-325 MG PO TABS
1.0000 | ORAL_TABLET | Freq: Once | ORAL | Status: AC
  Administered 2014-09-29: 1 via ORAL

## 2014-09-29 NOTE — ED Notes (Signed)
Pt alert & oriented x4, stable gait. Patient given discharge instructions, paperwork & prescription(s). Patient  instructed to stop at the registration desk to finish any additional paperwork. Patient verbalized understanding. Pt left department w/ no further questions. 

## 2014-09-29 NOTE — Discharge Instructions (Signed)
Prescription for antibiotic, pain medicine, cough medicine. You need to get a primary care doctor.     Emergency Department Resource Guide 1) Find a Doctor and Pay Out of Pocket Although you won't have to find out who is covered by your insurance plan, it is a good idea to ask around and get recommendations. You will then need to call the office and see if the doctor you have chosen will accept you as a new patient and what types of options they offer for patients who are self-pay. Some doctors offer discounts or will set up payment plans for their patients who do not have insurance, but you will need to ask so you aren't surprised when you get to your appointment.  2) Contact Your Local Health Department Not all health departments have doctors that can see patients for sick visits, but many do, so it is worth a call to see if yours does. If you don't know where your local health department is, you can check in your phone book. The CDC also has a tool to help you locate your state's health department, and many state websites also have listings of all of their local health departments.  3) Find a [facility] If your illness is not likely to be very severe or complicated, you may want to try a walk in clinic. These are popping up all over the country in pharmacies, drugstores, and shopping centers. They're usually staffed by nurse practitioners or physician assistants that have been trained to treat common illnesses and complaints. They're usually fairly quick and inexpensive. However, if you have serious medical issues or chronic medical problems, these are probably not your best option.  No Primary Care Doctor: - Call Health Connect at  [phone] - they can help you locate a primary care doctor that  accepts your insurance, provides certain services, etc. - Physician Referral Service- [phone]  Chronic Pain Problems: Organization         Address  Phone   Notes  [facility]  [phone] Patients need to be referred by their primary care doctor.   Medication Assistance: Organization         Address  Phone   Notes  [facility] Medication [facility] [address] [place], [place] [address] [phone] --Must be a resident of [facility] -- Must have NO insurance coverage whatsoever (no Medicaid/ Medicare, etc.) -- The pt. MUST have a primary care doctor that directs their care regularly and follows them in the community   [facility]ist  [phone]   [employer]  [phone]    Agencies that provide inexpensive medical care: Organization         Address  Phone   Notes  [place]  [phone]   [facility]    [phone]   [facility] [place], [place] [address] [phone]   [place] [address], [place] [phone]   Planned Parenthood    [phone]   [facility]    [phone]   [place] and [place] [address], [place] Phone:  [phone], Fax:  [phone] Hours of Operation:  9 am - 6 pm, M-F.  Also accepts Medicaid/Medicare and self-pay.  [facility] [place], [address], [place] Phone: [phone], Fax: [phone]. Hours of  Operation:  8:30 am - 5:30 pm, M-F.  Also accepts Medicaid and self-pay.  [facility] [place] [address] [place] Phone: [phone]   [place], [place], [place] [phone], Ext. [phone] Mondays & Thursdays: 7-9 AM.  First 15 patients are seen on a first come, first serve basis.    [place] Providers:  Organization         Address  Phone   Notes  [facility] [address], [place] [phone] Also accepts self-pay patients.  [facility] [address], [place]  [phone]   [place], [address], [place] [phone]   [facility] [address] [place] [phone]   Lucianne Lei [address], [place]   [phone] Only accepts [place] Access [place] patients after they have their name applied to their card.   Self-Pay (no insurance) in [facility]:  Organization         Address  Phone   Notes  Sickle Cell Patients, [facility] [place] [phone]   [facility] [place] [phone]   [facility] [place]  [place], [place], [place] [phone]   [facility]/Dr. Osei-Bonsu  [address], [place] or [address], [place] [phone] Phone number for both [place] and [place] locations is the same.  Urgent Medical and [facility] [address], [place] [phone]   [facility] [address], [place] or [address] [phone]   [facility] [address], [place] [phone], phone; [phone], fax Sees patients 1st and 3rd Saturday of every month.  Must not qualify for public or private insurance (i.e. Medicaid, Medicare, [place] Health Choice, Veterans' Benefits)  Household income should be no more than 200% of the poverty level The clinic cannot treat you if you are pregnant or think you are pregnant  Sexually transmitted diseases are not treated at the clinic.    Dental Care: Organization         Address  Phone  Notes  [facility] Department of [facility] [place] [phone] Accepts children up to age 56 who are enrolled in [place] or [place]; pregnant women with a Medicaid card; and children who have applied for Medicaid or [place] Health  Choice, but were declined, whose parents can pay a reduced fee at time of service.  [facility] Department of [facility]  [address], [place] [phone] Accepts children up to age 43 who are enrolled in [place] or [place]; pregnant women with a Medicaid card; and children who have applied for Medicaid or [place] Health Choice, but were declined, whose parents can pay a reduced fee at time of service.  [place] Adult Dental Access PROGRAM  [place] [phone] Patients are seen by appointment only. Walk-ins are not accepted. [place] will see patients 74 years of age and older. Monday - Tuesday (8am-5pm) Most Wednesdays (8:30-5pm) $30 per visit, cash only  [facility] Adult Dental Access PROGRAM  [address], [facility] [phone] Patients are seen  by appointment only. Walk-ins are not accepted. [place] will see patients 67 years of age and older. One Wednesday Evening (Monthly: Volunteer Based).  $30 per visit, cash only  [place]  [phone] for adults; Children under age 78, call Graduate Pediatric Dentistry at [phone]. Children aged 72-14, please call [phone] to request a pediatric application.  Dental services are provided in all areas of dental care including fillings, crowns and bridges, complete and partial dentures, implants, gum treatment, root canals, and extractions. Preventive care is also provided. Treatment is provided to both adults and children. Patients are selected via a lottery and there is often a waiting list.   [facility] [address], [place]  [phone] [email or web address]   [facility] [address] [place], [place] [phone], Ext. [phone] Second and Fourth Thursday of each month, opens at 6:30 AM; Clinic ends at 9 AM.  Patients are seen on a first-come first-served basis, and a limited number are seen during each  clinic.   [facility]  [address] Hillard Danker [place], [place] [phone]   Eligibility Requirements You must have lived in [place], [place], or [place] counties for at least the last three months.   You cannot be eligible for state or federal sponsored [employer], including [employer], [place], or [employer].   You generally cannot be eligible for healthcare insurance through your employer.    How to apply: Eligibility screenings are held every Tuesday and Wednesday afternoon from 1:00 pm until 4:00 pm. You do not need an appointment for the interview!  [facility] [address], [place], [place]   [place]  [place] Department  [place]  [phone]    Behavioral Health Resources in the Community: Intensive Outpatient Programs Organization         Address  Phone  Notes  [place]. [address], [place], [place] [phone]   [facility] Outpatient [address], [place], [place]   ADS: Alcohol & Drug Svcs [address], [place], [place]   [place] [address]  [place], [place] or [phone]   Substance Abuse Resources Organization         Address  Phone  Notes  Alcohol and Drug Services  [phone]   [place]  [phone]   The [place]   [place]a Pester  [phone]   Residential & Outpatient Substance Abuse Program  [phone]   Psychological Services Organization         Address  Phone  Notes  [facility] [place]  [place]  [phone]   [place] [address], [place] or [phone]    Mobile Crisis Teams Organization         Address  Phone  Notes  [facility], Mobile  Crisis Care Unit  [phone]   [facility]  [address] [place], [place]   [place] [address] [place] [phone]    Self-Help/Support Groups Organization         Address  Phone             Notes  [place]. of [place] - variety of support groups  [place] Call for more  information  Narcotics Anonymous (NA), Caring Services [address], [place]  2 meetings at this location   Residential Facilities manager         Address  Phone  Notes  [facility] [place],    [place]  [phone]   [facility]  [address] [place] [address], [place], [place]   [place], [place] [phone] Admissions: 8am-3pm M-F  Incentives Substance [place] [address],    [place], [place] [phone]   [facility] [address] [place], [place], [place]   [facility] [address],  [place], [place]   [facility] Programs - Intensive Outpatient [address] Kristeen Mans [address], [place], [place]   [facility].,  [place], [place] [phone] or [phone]   [facility]idential Treatment Services (RTS) [address] [place], [place] Accepts Medicaid  [facility] [place] [address],  [place] [phone] Substance Abuse/Addiction Treatment   [facility] Organization         Address  Phone  Notes  [facility]  [phone]   Domenic Schwab, PhD [address] [address] Arlis Porta [place], [place]   [phone] or [phone]   [place] [address] [place], [place] [phone]   [facility] [address] [place], [place] [phone] Insurance/Medicaid/sponsorship through [facility] and Families [address]  [place]                                    [place], [place] [phone] [place] [address], [place] [phone]    Dr. Adele Schilder  [phone]   [facility] Dept. 1) [address], [place] 2) [place] 3)  [place] [address], [place] [phone]  [phone]   [place] [phone] or [phone] (After Hours)

## 2014-09-29 NOTE — Care Management Note (Signed)
ED/CM noted patient did not have health insurance and/or PCP listed in the computer.  Patient was given the [facility] with information on the clinics, food pantries, and the handout for new health insurance sign-up. Pt was also given a Rx discount card. Patient expressed appreciation for information received.

## 2014-09-29 NOTE — ED Notes (Signed)
Pt reports has history of asthma and was seen here on oct 28th.  Reports was given prednisone and an inhaler to take at home.  Pt says still has productive cough with clear sputum and difficulty breathing.  Pt says mouth and chest feel "raw."  Also c/o toothache.

## 2014-09-29 NOTE — ED Notes (Signed)
EDP in room with pt 

## 2014-09-29 NOTE — ED Notes (Signed)
Pt states her breathing and coughing are better. C/o toothache.  Orders received.

## 2014-09-29 NOTE — ED Provider Notes (Signed)
CSN: [id number]     Arrival date & time 09/29/14  0821 History  This chart was scribed for Nat Christen, MD by Stephania Fragmin, ED Scribe. This patient was seen in room APA01/APA01 and the patient's care was started at 8:47 AM.    Chief Complaint  Patient presents with  . Cough   The history is provided by the patient and a significant other. No language interpreter was used.     HPI Comments: Taylor Vazquez is a 48 y.o. female with a history of asthma and smoking who presents to the Emergency Department complaining of a worsening cough that began 1-2 months ago. She was seen at a hospital October 28th for the same cough and given an inhaler that she uses multiple times a day. She complains that her mouth and chest are raw and that she has difficulty breathing. Per boyfriend, she wheezes at night. A hot shower PTA alleviated her difficulty breathing. 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 Patient smokes about 1 cigarette a day, down from 1 PPD.  She also mentions several toothaches that she has been unable to check out due to insurance issues.   Past Medical History  Diagnosis Date  . Sickle cell trait   . Hypertension   . Asthma   . Depression   . Obesity   . Stroke    Past Surgical History  Procedure Laterality Date  . No past surgeries     Family History  Problem Relation Age of Onset  . Other Neg Hx    History  Substance Use Topics  . Smoking status: Current Every Day Smoker -- 1.00 packs/day    Types: Cigarettes  . Smokeless tobacco: Never Used  . Alcohol Use: Yes     Comment: occ   OB History    Gravida Para Term Preterm AB TAB SAB Ectopic Multiple Living   8 5  5 1 1    4      Review of Systems  HENT: Positive for dental problem.   Respiratory: Positive for cough, shortness of breath and wheezing.   All other systems reviewed and are negative.     Allergies  Bee venom and Shellfish allergy  Home  Medications   Prior to Admission medications   Medication Sig Start Date End Date Taking? Authorizing Provider  acetaminophen (TYLENOL) 500 MG tablet Take 1,000 mg by mouth every 6 (six) hours as needed for mild pain.   Yes Historical Provider, MD  albuterol (PROVENTIL HFA;VENTOLIN HFA) 108 (90 BASE) MCG/ACT inhaler Inhale 2 puffs into the lungs every 4 (four) hours as needed for wheezing or shortness of breath.   Yes Historical Provider, MD  amLODipine (NORVASC) 5 MG tablet Take 5 mg by mouth daily.   Yes Historical Provider, MD  aspirin 325 MG tablet Take 1 tablet (325 mg total) by mouth daily. 05/19/14  Yes Ripudeep Krystal Eaton, MD  atorvastatin (LIPITOR) 20 MG tablet Take 20 mg by mouth daily.   Yes Historical Provider, MD  lisinopril (PRINIVIL,ZESTRIL) 20 MG tablet Take 1 tablet (20 mg total) by mouth daily. 08/18/14  Yes Fredia Sorrow, MD  amoxicillin (AMOXIL) 500 MG capsule Take 1 capsule (500 mg total) by mouth 3 (three) times daily. 09/29/14   Nat Christen, MD  benzonatate (TESSALON) 100 MG capsule Take 1 capsule (100 mg total) by mouth every 8 (eight) hours. 09/29/14   Nat Christen, MD  oxyCODONE-acetaminophen (  PERCOCET) 5-325 MG per tablet Take 1 tablet by mouth every 4 (four) hours as needed. 09/29/14   Nat Christen, MD  predniSONE (DELTASONE) 10 MG tablet Take 4 tablets (40 mg total) by mouth daily. Patient not taking: Reported on 09/29/2014 08/18/14   Fredia Sorrow, MD   BP 143/92 mmHg  Pulse 79  Temp(Src) 98 F (36.7 C) (Oral)  Resp 16  SpO2 98%  LMP 08/29/2014 Physical Exam  Constitutional: She is oriented to person, place, and time. She appears well-developed and well-nourished.  Obese.  HENT:  Head: Normocephalic and atraumatic.  Eyes: Conjunctivae and EOM are normal. Pupils are equal, round, and reactive to light.  Neck: Normal range of motion. Neck supple.  Cardiovascular: Normal rate, regular rhythm and normal heart sounds.   Pulmonary/Chest: Effort normal and breath sounds  normal. She has no wheezes.  Lungs are clear.  Abdominal: Soft. Bowel sounds are normal.  Musculoskeletal: Normal range of motion.  Neurological: She is alert and oriented to person, place, and time.  Skin: Skin is warm and dry.  Psychiatric: She has a normal mood and affect. Her behavior is normal.  Nursing note and vitals reviewed.   ED Course  Procedures (including critical care time)  DIAGNOSTIC STUDIES: Oxygen Saturation is 99% on room air, normal by my interpretation.    COORDINATION OF CARE: 8:51 AM - Discussed treatment plan with pt at bedside which includes chest XR and breathing treatment and pt agreed to plan.  Labs Review Labs Reviewed  BASIC METABOLIC PANEL - Abnormal; Notable for the following:    GFR calc non Af Amer 61 (*)    GFR calc Af Amer 70 (*)    All other components within normal limits  CBC WITH DIFFERENTIAL - Abnormal; Notable for the following:    RBC 5.15 (*)    Monocytes Absolute 1.1 (*)    All other components within normal limits  TROPONIN I    Imaging Review Dg Chest 2 View  09/29/2014   CLINICAL DATA:  Cough.  EXAM: CHEST  2 VIEW  COMPARISON:  August 18, 2014.  FINDINGS: The heart size and mediastinal contours are within normal limits. Both lungs are clear. No pneumothorax or pleural effusion is noted. The visualized skeletal structures are unremarkable.  IMPRESSION: No acute cardiopulmonary abnormality seen.   Electronically Signed   By: Sabino Dick M.D.   On: 09/29/2014 10:11     EKG Interpretation   Date/Time:  Wednesday September 29 2014 09:33:15 EST Ventricular Rate:  63 PR Interval:  136 QRS Duration: 83 QT Interval:  424 QTC Calculation: 434 R Axis:   56 Text Interpretation:  Unknown rhythm, irregular rate Consider left atrial  enlargement Anteroseptal infarct, old Confirmed by Taryn Nave  MD, Aubriee Szeto ([id number])  on 09/29/2014 1:28:20 PM Also confirmed by Lacinda Axon  MD, Janiya Millirons ([id number])  on  09/29/2014 1:28:52 PM     Interpretation normal sinus  rhythm with sinus arrhythmia, rate 63 MDM   Final diagnoses:  Cough  Toothache   Patient is nontoxic-appearing. She has COPD. Chest x-ray negative, EKG normal, troponin negative. She does not have a primary care relationship. Discharge medications Tessalon Perles, Percocet, amoxicillin 500 mg for teeth  I personally performed the services described in this documentation, which was scribed in my presence. The recorded information has been reviewed and is accurate.       Nat Christen, MD 09/29/14 1538

## 2014-09-29 NOTE — ED Notes (Signed)
Multiple complaints. Pt states continued cough since here 2 weeks ago. States productive cough "sometimes yellow, sometimes clear" states mouth and throat feel raw. States toothache also. States that inhaler given last time, is her throat raw.

## 2014-11-08 ENCOUNTER — Emergency Department (HOSPITAL_COMMUNITY)
Admission: EM | Admit: 2014-11-08 | Discharge: 2014-11-08 | Disposition: A | Discharge status: Home / Self Care | Payer: MEDICAID

## 2014-11-09 ENCOUNTER — Emergency Department (HOSPITAL_COMMUNITY)
Admission: EM | Admit: 2014-11-09 | Discharge: 2014-11-09 | Disposition: A | Discharge status: Home / Self Care | Payer: Self-pay | Attending: Emergency Medicine | Admitting: Emergency Medicine

## 2014-11-09 ENCOUNTER — Encounter (HOSPITAL_COMMUNITY): Payer: Self-pay | Admitting: Emergency Medicine

## 2014-11-09 DIAGNOSIS — Z792 Long term (current) use of antibiotics: Secondary | ICD-10-CM | POA: Insufficient documentation

## 2014-11-09 DIAGNOSIS — K0889 Other specified disorders of teeth and supporting structures: Secondary | ICD-10-CM

## 2014-11-09 DIAGNOSIS — J0100 Acute maxillary sinusitis, unspecified: Secondary | ICD-10-CM

## 2014-11-09 DIAGNOSIS — Z8673 Personal history of transient ischemic attack (TIA), and cerebral infarction without residual deficits: Secondary | ICD-10-CM | POA: Insufficient documentation

## 2014-11-09 DIAGNOSIS — I10 Essential (primary) hypertension: Secondary | ICD-10-CM | POA: Insufficient documentation

## 2014-11-09 DIAGNOSIS — J45909 Unspecified asthma, uncomplicated: Secondary | ICD-10-CM | POA: Insufficient documentation

## 2014-11-09 DIAGNOSIS — Z79899 Other long term (current) drug therapy: Secondary | ICD-10-CM | POA: Insufficient documentation

## 2014-11-09 DIAGNOSIS — Z7952 Long term (current) use of systemic steroids: Secondary | ICD-10-CM | POA: Insufficient documentation

## 2014-11-09 DIAGNOSIS — F329 Major depressive disorder, single episode, unspecified: Secondary | ICD-10-CM | POA: Insufficient documentation

## 2014-11-09 DIAGNOSIS — E669 Obesity, unspecified: Secondary | ICD-10-CM | POA: Insufficient documentation

## 2014-11-09 DIAGNOSIS — K088 Other specified disorders of teeth and supporting structures: Secondary | ICD-10-CM | POA: Insufficient documentation

## 2014-11-09 DIAGNOSIS — Z862 Personal history of diseases of the blood and blood-forming organs and certain disorders involving the immune mechanism: Secondary | ICD-10-CM | POA: Insufficient documentation

## 2014-11-09 MED ORDER — AMOXICILLIN 500 MG PO CAPS: 500.0000 mg | ORAL_CAPSULE | Freq: Three times a day (TID) | ORAL | Status: DC

## 2014-11-09 MED ORDER — HYDROCODONE-ACETAMINOPHEN 5-325 MG PO TABS: 2.0000 | ORAL_TABLET | ORAL | Status: DC | PRN

## 2014-11-09 NOTE — ED Provider Notes (Signed)
CSN: [id number]     Arrival date & time 11/09/14  1112 History   First MD Initiated Contact with Patient 11/09/14 1135     Chief Complaint  Patient presents with  . Dental Pain  . Otalgia     (Consider location/radiation/quality/duration/timing/severity/associated sxs/prior Treatment) Patient is a 49 y.o. female presenting with tooth pain and ear pain. The history is provided by the patient. No language interpreter was used.  Dental Pain Location:  Lower Quality:  Dull Severity:  Moderate Onset quality:  Gradual Duration:  3 days Timing:  Constant Progression:  Worsening Chronicity:  New Context: poor dentition   Relieved by:  Nothing Worsened by:  Nothing tried Associated symptoms: no fever   Risk factors: periodontal disease   Otalgia Associated symptoms: no fever     Past Medical History  Diagnosis Date  . Sickle cell trait   . Hypertension   . Asthma   . Depression   . Obesity   . Stroke    Past Surgical History  Procedure Laterality Date  . No past surgeries     Family History  Problem Relation Age of Onset  . Other Neg Hx    History  Substance Use Topics  . Smoking status: Current Every Day Smoker -- 1.00 packs/day    Types: Cigarettes  . Smokeless tobacco: Never Used  . Alcohol Use: Yes     Comment: occ   OB History    Gravida Para Term Preterm AB TAB SAB Ectopic Multiple Living   8 5  5 1 1    4      Review of Systems  Constitutional: Negative for fever.  HENT: Positive for ear pain.   All other systems reviewed and are negative.     Allergies  Bee venom and Shellfish allergy  Home Medications   Prior to Admission medications   Medication Sig Start Date End Date Taking? Authorizing Provider  acetaminophen (TYLENOL) 500 MG tablet Take 1,000 mg by mouth every 6 (six) hours as needed for mild pain.    Historical Provider, MD  albuterol (PROVENTIL HFA;VENTOLIN HFA) 108 (90 BASE) MCG/ACT inhaler Inhale 2 puffs into the lungs every 4 (four)  hours as needed for wheezing or shortness of breath.    Historical Provider, MD  amLODipine (NORVASC) 5 MG tablet Take 5 mg by mouth daily.    Historical Provider, MD  amoxicillin (AMOXIL) 500 MG capsule Take 1 capsule (500 mg total) by mouth 3 (three) times daily. 09/29/14   Nat Christen, MD  aspirin 325 MG tablet Take 1 tablet (325 mg total) by mouth daily. 05/19/14   Ripudeep Krystal Eaton, MD  atorvastatin (LIPITOR) 20 MG tablet Take 20 mg by mouth daily.    Historical Provider, MD  benzonatate (TESSALON) 100 MG capsule Take 1 capsule (100 mg total) by mouth every 8 (eight) hours. 09/29/14   Nat Christen, MD  lisinopril (PRINIVIL,ZESTRIL) 20 MG tablet Take 1 tablet (20 mg total) by mouth daily. 08/18/14   Fredia Sorrow, MD  oxyCODONE-acetaminophen (PERCOCET) 5-325 MG per tablet Take 1 tablet by mouth every 4 (four) hours as needed. 09/29/14   Nat Christen, MD  predniSONE (DELTASONE) 10 MG tablet Take 4 tablets (40 mg total) by mouth daily. Patient not taking: Reported on 09/29/2014 08/18/14   Fredia Sorrow, MD   BP 160/88 mmHg  Pulse 108  Temp(Src) 98.2 F (36.8 C) (Oral)  Resp 21  Ht 5\' 3"  (1.6 m)  Wt 237 lb (107.502 kg)  BMI 41.99  kg/m2  SpO2 99%  LMP 11/02/2014 Physical Exam  Constitutional: She is oriented to person, place, and time. She appears well-developed and well-nourished.  HENT:  Head: Normocephalic.  Right Ear: External ear normal.  Left Ear: External ear normal.  Nose: Nose normal.  Mouth/Throat: Oropharynx is clear and moist.  Eyes: EOM are normal. Pupils are equal, round, and reactive to light.  Neck: Normal range of motion.  Cardiovascular: Normal rate and normal heart sounds.   Pulmonary/Chest: Effort normal.  Abdominal: Soft. She exhibits no distension.  Musculoskeletal: Normal range of motion.  Neurological: She is alert and oriented to person, place, and time.  Skin: Skin is warm.  Psychiatric: She has a normal mood and affect.  Nursing note and vitals  reviewed.   ED Course  Procedures (including critical care time) Labs Review Labs Reviewed - No data to display  Imaging Review No results found.   EKG Interpretation None      MDM   Final diagnoses:  Acute maxillary sinusitis, recurrence not specified  Toothache    amoxicillian Hydrocodone Dental referrals    Fransico Meadow, PA-C 11/09/14 1234  Virgel Manifold, MD 11/09/14 1345

## 2014-11-09 NOTE — ED Notes (Signed)
Patient with no complaints at this time. Respirations even and unlabored. Skin warm/dry. Discharge instructions reviewed with patient at this time. Patient given opportunity to voice concerns/ask questions. Patient discharged at this time and left Emergency Department with steady gait.   

## 2014-11-09 NOTE — ED Notes (Signed)
Patient with c/o right upper dental pain, bilateral ear pain, and sinus pressure and pain x 3 days.

## 2014-11-09 NOTE — Discharge Instructions (Signed)
Dental Pain A tooth ache may be caused by cavities (tooth decay). Cavities expose the nerve of the tooth to air and hot or cold temperatures. It may come from an infection or abscess (also called a boil or furuncle) around your tooth. It is also often caused by dental caries (tooth decay). This causes the pain you are having. DIAGNOSIS  Your caregiver can diagnose this problem by exam. TREATMENT   If caused by an infection, it may be treated with medications which kill germs (antibiotics) and pain medications as prescribed by your caregiver. Take medications as directed.  Only take over-the-counter or prescription medicines for pain, discomfort, or fever as directed by your caregiver.  Whether the tooth ache today is caused by infection or dental disease, you should see your dentist as soon as possible for further care. SEEK MEDICAL CARE IF: The exam and treatment you received today has been provided on an emergency basis only. This is not a substitute for complete medical or dental care. If your problem worsens or new problems (symptoms) appear, and you are unable to meet with your dentist, call or return to this location. SEEK IMMEDIATE MEDICAL CARE IF:   You have a fever.  You develop redness and swelling of your face, jaw, or neck.  You are unable to open your mouth.  You have severe pain uncontrolled by pain medicine. MAKE SURE YOU:   Understand these instructions.  Will watch your condition.  Will get help right away if you are not doing well or get worse. Document Released: 10/08/2005 Document Revised: 12/31/2011 Document Reviewed: 05/26/2008 [facility] Patient Information 2015 [place], [place]. This information is not intended to replace advice given to you by your health care provider. Make sure you discuss any questions you have with your health care provider. Sinusitis Sinusitis is redness, soreness, and puffiness (inflammation) of the air pockets in the bones of your face  (sinuses). The redness, soreness, and puffiness can cause air and mucus to get trapped in your sinuses. This can allow germs to grow and cause an infection.  HOME CARE   Drink enough fluids to keep your pee (urine) clear or pale yellow.  Use a humidifier in your home.  Run a hot shower to create steam in the bathroom. Sit in the bathroom with the door closed. Breathe in the steam 3-4 times a day.  Put a warm, moist washcloth on your face 3-4 times a day, or as told by your doctor.  Use salt water sprays (saline sprays) to wet the thick fluid in your nose. This can help the sinuses drain.  Only take medicine as told by your doctor. GET HELP RIGHT AWAY IF:   Your pain gets worse.  You have very bad headaches.  You are sick to your stomach (nauseous).  You throw up (vomit).  You are very sleepy (drowsy) all the time.  Your face is puffy (swollen).  Your vision changes.  You have a stiff neck.  You have trouble breathing. MAKE SURE YOU:   Understand these instructions.  Will watch your condition.  Will get help right away if you are not doing well or get worse. Document Released: 03/26/2008 Document Revised: 07/02/2012 Document Reviewed: 05/13/2012 [facility] Patient Information 2015 [place], [place]. This information is not intended to replace advice given to you by your health care provider. Make sure you discuss any questions you have with your health care provider.

## 2014-12-12 ENCOUNTER — Encounter (HOSPITAL_COMMUNITY): Payer: Self-pay | Admitting: Emergency Medicine

## 2014-12-12 ENCOUNTER — Emergency Department (HOSPITAL_COMMUNITY)
Admission: EM | Admit: 2014-12-12 | Discharge: 2014-12-12 | Disposition: A | Discharge status: Home / Self Care | Payer: Self-pay | Attending: Emergency Medicine | Admitting: Emergency Medicine

## 2014-12-12 DIAGNOSIS — Z7982 Long term (current) use of aspirin: Secondary | ICD-10-CM | POA: Insufficient documentation

## 2014-12-12 DIAGNOSIS — Z8673 Personal history of transient ischemic attack (TIA), and cerebral infarction without residual deficits: Secondary | ICD-10-CM | POA: Insufficient documentation

## 2014-12-12 DIAGNOSIS — E669 Obesity, unspecified: Secondary | ICD-10-CM | POA: Insufficient documentation

## 2014-12-12 DIAGNOSIS — Z720 Tobacco use: Secondary | ICD-10-CM | POA: Insufficient documentation

## 2014-12-12 DIAGNOSIS — J45909 Unspecified asthma, uncomplicated: Secondary | ICD-10-CM | POA: Insufficient documentation

## 2014-12-12 DIAGNOSIS — K002 Abnormalities of size and form of teeth: Secondary | ICD-10-CM | POA: Insufficient documentation

## 2014-12-12 DIAGNOSIS — K029 Dental caries, unspecified: Secondary | ICD-10-CM | POA: Insufficient documentation

## 2014-12-12 DIAGNOSIS — Z79899 Other long term (current) drug therapy: Secondary | ICD-10-CM | POA: Insufficient documentation

## 2014-12-12 DIAGNOSIS — Z862 Personal history of diseases of the blood and blood-forming organs and certain disorders involving the immune mechanism: Secondary | ICD-10-CM | POA: Insufficient documentation

## 2014-12-12 DIAGNOSIS — F329 Major depressive disorder, single episode, unspecified: Secondary | ICD-10-CM | POA: Insufficient documentation

## 2014-12-12 DIAGNOSIS — I10 Essential (primary) hypertension: Secondary | ICD-10-CM | POA: Insufficient documentation

## 2014-12-12 DIAGNOSIS — Z7952 Long term (current) use of systemic steroids: Secondary | ICD-10-CM | POA: Insufficient documentation

## 2014-12-12 MED ORDER — HYDROCODONE-ACETAMINOPHEN 5-325 MG PO TABS: 1.0000 | ORAL_TABLET | ORAL | Status: DC | PRN

## 2014-12-12 MED ORDER — AMOXICILLIN 250 MG PO CAPS
500.0000 mg | ORAL_CAPSULE | Freq: Once | ORAL | Status: AC
  Administered 2014-12-12: 500 mg via ORAL
  Filled 2014-12-12: qty 2

## 2014-12-12 MED ORDER — AMOXICILLIN 500 MG PO CAPS: 500.0000 mg | ORAL_CAPSULE | Freq: Three times a day (TID) | ORAL | Status: DC

## 2014-12-12 MED ORDER — LISINOPRIL 20 MG PO TABS: 20.0000 mg | ORAL_TABLET | Freq: Every day | ORAL | Status: DC

## 2014-12-12 MED ORDER — HYDROCODONE-ACETAMINOPHEN 5-325 MG PO TABS
1.0000 | ORAL_TABLET | Freq: Once | ORAL | Status: AC
  Administered 2014-12-12: 1 via ORAL
  Filled 2014-12-12: qty 1

## 2014-12-12 NOTE — ED Notes (Signed)
Having dental pain for 7 days, rates pain 10.  Entire mouth painful.

## 2014-12-12 NOTE — ED Provider Notes (Signed)
CSN: [id number]     Arrival date & time 12/12/14  0845 History   First MD Initiated Contact with Patient 12/12/14 [phone]     Chief Complaint  Patient presents with  . Dental Pain     (Consider location/radiation/quality/duration/timing/severity/associated sxs/prior Treatment) Patient is a 49 y.o. female presenting with tooth pain. The history is provided by the patient.  Dental Pain Location:  Generalized Quality:  Aching and throbbing Severity:  Moderate Onset quality:  Gradual Duration:  7 days Timing:  Intermittent Progression:  Worsening Chronicity:  Chronic Context: dental caries and poor dentition   Relieved by:  Nothing Worsened by:  Cold food/drink Associated symptoms: gum swelling   Associated symptoms: no fever and no neck pain   Risk factors: alcohol problem, lack of dental care and smoking     Past Medical History  Diagnosis Date  . Sickle cell trait   . Hypertension   . Asthma   . Depression   . Obesity   . Stroke    Past Surgical History  Procedure Laterality Date  . No past surgeries     Family History  Problem Relation Age of Onset  . Other Neg Hx    History  Substance Use Topics  . Smoking status: Current Every Day Smoker -- 1.00 packs/day    Types: Cigarettes  . Smokeless tobacco: Never Used  . Alcohol Use: Yes     Comment: occ   OB History    Gravida Para Term Preterm AB TAB SAB Ectopic Multiple Living   8 5  5 1 1    4      Review of Systems  Constitutional: Negative for fever and activity change.       All ROS Neg except as noted in HPI  HENT: Positive for dental problem.   Eyes: Negative for photophobia and discharge.  Respiratory: Negative for cough, shortness of breath and wheezing.   Cardiovascular: Negative for chest pain and palpitations.  Gastrointestinal: Negative for abdominal pain and blood in stool.  Genitourinary: Negative for dysuria, frequency and hematuria.  Musculoskeletal: Negative for back pain, arthralgias and  neck pain.  Skin: Negative.   Neurological: Negative for dizziness, seizures and speech difficulty.  Psychiatric/Behavioral: Negative for hallucinations and confusion.       Depression      Allergies  Bee venom and Shellfish allergy  Home Medications   Prior to Admission medications   Medication Sig Start Date End Date Taking? Authorizing Provider  amLODipine (NORVASC) 5 MG tablet Take 5 mg by mouth daily.   Yes Historical Provider, MD  aspirin 325 MG tablet Take 1 tablet (325 mg total) by mouth daily. 05/19/14  Yes Ripudeep Krystal Eaton, MD  atorvastatin (LIPITOR) 20 MG tablet Take 20 mg by mouth daily.   Yes Historical Provider, MD  lisinopril (PRINIVIL,ZESTRIL) 20 MG tablet Take 1 tablet (20 mg total) by mouth daily. 08/18/14  Yes Fredia Sorrow, MD  albuterol (PROVENTIL HFA;VENTOLIN HFA) 108 (90 BASE) MCG/ACT inhaler Inhale 2 puffs into the lungs every 4 (four) hours as needed for wheezing or shortness of breath.    Historical Provider, MD  amoxicillin (AMOXIL) 500 MG capsule Take 1 capsule (500 mg total) by mouth 3 (three) times daily. Patient not taking: Reported on 12/12/2014 11/09/14   Fransico Meadow, PA-C  benzonatate (TESSALON) 100 MG capsule Take 1 capsule (100 mg total) by mouth every 8 (eight) hours. Patient not taking: Reported on 12/12/2014 09/29/14   Nat Christen, MD  HYDROcodone-acetaminophen (NORCO/VICODIN)  5-325 MG per tablet Take 2 tablets by mouth every 4 (four) hours as needed. Patient not taking: Reported on 12/12/2014 11/09/14   Fransico Meadow, PA-C  oxyCODONE-acetaminophen (PERCOCET) 5-325 MG per tablet Take 1 tablet by mouth every 4 (four) hours as needed. Patient not taking: Reported on 12/12/2014 09/29/14   Nat Christen, MD  predniSONE (DELTASONE) 10 MG tablet Take 4 tablets (40 mg total) by mouth daily. Patient not taking: Reported on 09/29/2014 08/18/14   Fredia Sorrow, MD   BP 125/93 mmHg  Pulse 95  Temp(Src) 98.4 F (36.9 C) (Oral)  Resp 14  Ht 5\' 3"  (1.6 m)  Wt  237 lb (107.502 kg)  BMI 41.99 kg/m2  SpO2 97%  LMP 12/10/2014 Physical Exam  Constitutional: She is oriented to person, place, and time. She appears well-developed and well-nourished.  Non-toxic appearance.  HENT:  Head: Normocephalic.  Right Ear: Tympanic membrane and external ear normal.  Left Ear: Tympanic membrane and external ear normal.  Mouth/Throat: Uvula is midline, oropharynx is clear and moist and mucous membranes are normal. No trismus in the jaw. Abnormal dentition. Dental caries present.  Eyes: EOM and lids are normal. Pupils are equal, round, and reactive to light.  Neck: Normal range of motion. Neck supple. Carotid bruit is not present.  Cardiovascular: Normal rate, regular rhythm, normal heart sounds, intact distal pulses and normal pulses.   Pulmonary/Chest: Breath sounds normal. No respiratory distress.  Abdominal: Soft. Bowel sounds are normal. There is no tenderness. There is no guarding.  Musculoskeletal: Normal range of motion.  Lymphadenopathy:       Head (right side): No submandibular adenopathy present.       Head (left side): No submandibular adenopathy present.    She has no cervical adenopathy.  Neurological: She is alert and oriented to person, place, and time. She has normal strength. No cranial nerve deficit or sensory deficit.  Skin: Skin is warm and dry.  Psychiatric: She has a normal mood and affect. Her speech is normal.  Nursing note and vitals reviewed.   ED Course  Procedures (including critical care time) Labs Review Labs Reviewed - No data to display  Imaging Review No results found.   EKG Interpretation None      MDM  Patient states she's been dealing with difficulty with her teeth for some time. She is attempting to get her Medicaid reinstated, and does not have other resources to have dental work done.  Vital signs are well within normal limits with exception of the blood pressure being slightly elevated at 125/93. The patient  states that she has a history of hypertension, she has run out of her lisinopril, and is requesting a refill on this medication.  The patient has multiple dental caries. She will be treated with amoxicillin, and Norco. The patient is given on dental resources, as well as resources for a medical physician.    Final diagnoses:  Dental caries  Essential hypertension    **I have reviewed nursing notes, vital signs, and all appropriate lab and imaging results for this patient.Lenox Ahr, PA-C 12/12/14 [place], MD 12/16/14 1026

## 2014-12-12 NOTE — Discharge Instructions (Signed)
Dental Caries °Dental caries (also called tooth decay) is the most common oral disease. It can occur at any age but is more common in children and young adults.  °HOW DENTAL CARIES DEVELOPS  °The process of decay begins when bacteria and foods (particularly sugars and starches) combine in your mouth to produce plaque. Plaque is a substance that sticks to the hard, outer surface of a tooth (enamel). The bacteria in plaque produce acids that attack enamel. These acids may also attack the root surface of a tooth (cementum) if it is exposed. Repeated attacks dissolve these surfaces and create holes in the tooth (cavities). If left untreated, the acids destroy the other layers of the tooth.  °RISK FACTORS °· Frequent sipping of sugary beverages.   °· Frequent snacking on sugary and starchy foods, especially those that easily get stuck in the teeth.   °· Poor oral hygiene.   °· Dry mouth.   °· Substance abuse such as methamphetamine abuse.   °· Broken or poor-fitting dental restorations.   °· Eating disorders.   °· Gastroesophageal reflux disease (GERD).   °· Certain radiation treatments to the head and neck. °SYMPTOMS °In the early stages of dental caries, symptoms are seldom present. Sometimes white, chalky areas may be seen on the enamel or other tooth layers. In later stages, symptoms may include: °· Pits and holes on the enamel. °· Toothache after sweet, hot, or cold foods or drinks are consumed. °· Pain around the tooth. °· Swelling around the tooth. °DIAGNOSIS  °Most of the time, dental caries is detected during a regular dental checkup. A diagnosis is made after a thorough medical and dental history is taken and the surfaces of your teeth are checked for signs of dental caries. Sometimes special instruments, such as lasers, are used to check for dental caries. Dental X-ray exams may be taken so that areas not visible to the eye (such as between the contact areas of the teeth) can be checked for cavities.    °TREATMENT  °If dental caries is in its early stages, it may be reversed with a fluoride treatment or an application of a remineralizing agent at the dental office. Thorough brushing and flossing at home is needed to aid these treatments. If it is in its later stages, treatment depends on the location and extent of tooth destruction:  °· If a small area of the tooth has been destroyed, the destroyed area will be removed and cavities will be filled with a material such as gold, silver amalgam, or composite resin.   °· If a large area of the tooth has been destroyed, the destroyed area will be removed and a cap (crown) will be fitted over the remaining tooth structure.   °· If the center part of the tooth (pulp) is affected, a procedure called a root canal will be needed before a filling or crown can be placed.   °· If most of the tooth has been destroyed, the tooth may need to be pulled (extracted). °HOME CARE INSTRUCTIONS °You can prevent, stop, or reverse dental caries at home by practicing good oral hygiene. Good oral hygiene includes: °· Thoroughly cleaning your teeth at least twice a day with a toothbrush and dental floss.   °· Using a fluoride toothpaste. A fluoride mouth rinse may also be used if recommended by your dentist or health care provider.   °· Restricting the amount of sugary and starchy foods and sugary liquids you consume.   °· Avoiding frequent snacking on these foods and sipping of these liquids.   °· Keeping regular visits with   a dentist for checkups and cleanings. PREVENTION   Practice good oral hygiene.  Consider a dental sealant. A dental sealant is a coating material that is applied by your dentist to the pits and grooves of teeth. The sealant prevents food from being trapped in them. It may protect the teeth for several years.  Ask about fluoride supplements if you live in a community without fluorinated water or with water that has a low fluoride content. Use fluoride supplements  as directed by your dentist or health care provider.  Allow fluoride varnish applications to teeth if directed by your dentist or health care provider. Document Released: 06/30/2002 Document Revised: 02/22/2014 Document Reviewed: 10/10/2012 [facility] Patient Information 2015 [place], [place]. This information is not intended to replace advice given to you by your health care provider. Make sure you discuss any questions you have with your health care provider.  Hypertension Hypertension is another name for high blood pressure. High blood pressure forces your heart to work harder to pump blood. A blood pressure reading has two numbers, which includes a higher number over a lower number (example: 110/72). HOME CARE   Have your blood pressure rechecked by your doctor.  Only take medicine as told by your doctor. Follow the directions carefully. The medicine does not work as well if you skip doses. Skipping doses also puts you at risk for problems.  Do not smoke.  Monitor your blood pressure at home as told by your doctor. GET HELP IF:  You think you are having a reaction to the medicine you are taking.  You have repeat headaches or feel dizzy.  You have puffiness (swelling) in your ankles.  You have trouble with your vision. GET HELP RIGHT AWAY IF:   You get a very bad headache and are confused.  You feel weak, numb, or faint.  You get chest or belly (abdominal) pain.  You throw up (vomit).  You cannot breathe very well. MAKE SURE YOU:   Understand these instructions.  Will watch your condition.  Will get help right away if you are not doing well or get worse. Document Released: 03/26/2008 Document Revised: 10/13/2013 Document Reviewed: 07/31/2013 [facility] Patient Information 2015 [place], [place]. This information is not intended to replace advice given to you by your health care provider. Make sure you discuss any questions you have with your health care provider.

## 2014-12-25 ENCOUNTER — Emergency Department (HOSPITAL_COMMUNITY): Payer: Self-pay

## 2014-12-25 ENCOUNTER — Emergency Department (HOSPITAL_COMMUNITY)
Admission: EM | Admit: 2014-12-25 | Discharge: 2014-12-25 | Disposition: A | Discharge status: Home / Self Care | Payer: Self-pay | Attending: Emergency Medicine | Admitting: Emergency Medicine

## 2014-12-25 ENCOUNTER — Encounter (HOSPITAL_COMMUNITY): Payer: Self-pay | Admitting: Nurse Practitioner

## 2014-12-25 DIAGNOSIS — R531 Weakness: Secondary | ICD-10-CM | POA: Insufficient documentation

## 2014-12-25 DIAGNOSIS — I10 Essential (primary) hypertension: Secondary | ICD-10-CM | POA: Insufficient documentation

## 2014-12-25 DIAGNOSIS — Z862 Personal history of diseases of the blood and blood-forming organs and certain disorders involving the immune mechanism: Secondary | ICD-10-CM | POA: Insufficient documentation

## 2014-12-25 DIAGNOSIS — Z720 Tobacco use: Secondary | ICD-10-CM | POA: Insufficient documentation

## 2014-12-25 DIAGNOSIS — R6883 Chills (without fever): Secondary | ICD-10-CM | POA: Insufficient documentation

## 2014-12-25 DIAGNOSIS — Z79899 Other long term (current) drug therapy: Secondary | ICD-10-CM | POA: Insufficient documentation

## 2014-12-25 DIAGNOSIS — R059 Cough, unspecified: Secondary | ICD-10-CM

## 2014-12-25 DIAGNOSIS — J45901 Unspecified asthma with (acute) exacerbation: Secondary | ICD-10-CM | POA: Insufficient documentation

## 2014-12-25 DIAGNOSIS — Z8659 Personal history of other mental and behavioral disorders: Secondary | ICD-10-CM | POA: Insufficient documentation

## 2014-12-25 DIAGNOSIS — M546 Pain in thoracic spine: Secondary | ICD-10-CM | POA: Insufficient documentation

## 2014-12-25 DIAGNOSIS — Z8673 Personal history of transient ischemic attack (TIA), and cerebral infarction without residual deficits: Secondary | ICD-10-CM | POA: Insufficient documentation

## 2014-12-25 DIAGNOSIS — E669 Obesity, unspecified: Secondary | ICD-10-CM | POA: Insufficient documentation

## 2014-12-25 DIAGNOSIS — R05 Cough: Secondary | ICD-10-CM | POA: Insufficient documentation

## 2014-12-25 DIAGNOSIS — Z7982 Long term (current) use of aspirin: Secondary | ICD-10-CM | POA: Insufficient documentation

## 2014-12-25 DIAGNOSIS — Z792 Long term (current) use of antibiotics: Secondary | ICD-10-CM | POA: Insufficient documentation

## 2014-12-25 MED ORDER — IPRATROPIUM-ALBUTEROL 0.5-2.5 (3) MG/3ML IN SOLN
3.0000 mL | Freq: Once | RESPIRATORY_TRACT | Status: AC
  Administered 2014-12-25: 3 mL via RESPIRATORY_TRACT

## 2014-12-25 MED ORDER — HYDROCODONE-ACETAMINOPHEN 5-325 MG PO TABS: 1.0000 | ORAL_TABLET | Freq: Four times a day (QID) | ORAL | Status: DC | PRN

## 2014-12-25 MED ORDER — IPRATROPIUM-ALBUTEROL 0.5-2.5 (3) MG/3ML IN SOLN
RESPIRATORY_TRACT | Status: AC
  Filled 2014-12-25: qty 3

## 2014-12-25 MED ORDER — ALBUTEROL SULFATE HFA 108 (90 BASE) MCG/ACT IN AERS
4.0000 | INHALATION_SPRAY | Freq: Once | RESPIRATORY_TRACT | Status: AC
  Administered 2014-12-25: 4 via RESPIRATORY_TRACT
  Filled 2014-12-25: qty 6.7

## 2014-12-25 MED ORDER — PREDNISONE 20 MG PO TABS: 40.0000 mg | ORAL_TABLET | Freq: Every day | ORAL | Status: DC

## 2014-12-25 NOTE — ED Notes (Signed)
Pt placed into gown and on monitor upon arrival to room. Pt monitored by blood pressure and pulse ox.  

## 2014-12-25 NOTE — ED Notes (Signed)
Pt states sob with productive cough x3 days and middle back pain x6 days, pt states has taken a lot of tylenol for pain and worried about overdose as well. Pt states hx of asthma, able to speak in complete sentences. Pt denies any injury to back pain, back very tender to touch. NAD noted. 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

## 2014-12-25 NOTE — ED Provider Notes (Signed)
CSN: [id number]     Arrival date & time 12/25/14  1313 History   First MD Initiated Contact with Patient 12/25/14 1500     Chief Complaint  Patient presents with  . Asthma   (Consider location/radiation/quality/duration/timing/severity/associated sxs/prior Treatment) Patient is a 49 y.o. female presenting with cough. The history is provided by the patient. No language interpreter was used.  Cough Cough characteristics:  Productive Sputum characteristics:  Yellow Severity:  Moderate Onset quality:  Gradual Duration:  3 days Timing:  Intermittent Progression:  Unchanged Chronicity:  Recurrent Smoker: yes   Context: smoke exposure   Context: not animal exposure, not exposure to allergens, not fumes, not occupational exposure, not sick contacts, not upper respiratory infection, not weather changes and not with activity   Relieved by:  Nothing Worsened by:  Nothing tried Ineffective treatments:  None tried Associated symptoms: chills, myalgias, shortness of breath and wheezing   Associated symptoms: no chest pain, no diaphoresis, no fever, no headaches, no rash, no rhinorrhea, no sinus congestion and no sore throat   Myalgias:    Location:  Back   Quality:  Aching   Severity:  Moderate   Onset quality:  Gradual   Duration:  6 days   Timing:  Constant   Progression:  Unchanged Shortness of breath:    Severity:  Moderate   Onset quality:  Gradual   Duration:  3 days   Timing:  Constant   Progression:  Unchanged Wheezing:    Severity:  Moderate   Onset quality:  Gradual   Duration:  3 days   Progression:  Unchanged   Chronicity:  New Risk factors: no chemical exposure, no recent infection and no recent travel     Past Medical History  Diagnosis Date  . Sickle cell trait   . Hypertension   . Asthma   . Depression   . Obesity   . Stroke    Past Surgical History  Procedure Laterality Date  . No past surgeries     Family History  Problem Relation Age of Onset  .  Other Neg Hx    History  Substance Use Topics  . Smoking status: Current Every Day Smoker -- 1.00 packs/day    Types: Cigarettes  . Smokeless tobacco: Never Used  . Alcohol Use: Yes     Comment: occ   OB History    Gravida Para Term Preterm AB TAB SAB Ectopic Multiple Living   8 5  5 1 1    4      Review of Systems  Constitutional: Positive for chills. Negative for fever and diaphoresis.  HENT: Negative for rhinorrhea and sore throat.   Respiratory: Positive for cough, shortness of breath and wheezing.   Cardiovascular: Negative for chest pain, palpitations and leg swelling.  Gastrointestinal: Negative for nausea, vomiting, abdominal pain and diarrhea.  Genitourinary: Negative for dysuria.  Musculoskeletal: Positive for myalgias.  Skin: Negative for rash.  Neurological: Negative for dizziness, weakness, light-headedness, numbness and headaches.  Hematological: Negative for adenopathy. Does not bruise/bleed easily.      Allergies  Bee venom and Shellfish allergy  Home Medications   Prior to Admission medications   Medication Sig Start Date End Date Taking? Authorizing Provider  albuterol (PROVENTIL HFA;VENTOLIN HFA) 108 (90 BASE) MCG/ACT inhaler Inhale 2 puffs into the lungs every 4 (four) hours as needed for wheezing or shortness of breath.    Historical Provider, MD  amLODipine (NORVASC) 5 MG tablet Take 5 mg by mouth daily.  Historical Provider, MD  amoxicillin (AMOXIL) 500 MG capsule Take 1 capsule (500 mg total) by mouth 3 (three) times daily. 12/12/14   Lenox Ahr, PA-C  aspirin 325 MG tablet Take 1 tablet (325 mg total) by mouth daily. 05/19/14   Ripudeep Krystal Eaton, MD  atorvastatin (LIPITOR) 20 MG tablet Take 20 mg by mouth daily.    Historical Provider, MD  benzonatate (TESSALON) 100 MG capsule Take 1 capsule (100 mg total) by mouth every 8 (eight) hours. Patient not taking: Reported on 12/12/2014 09/29/14   Nat Christen, MD  HYDROcodone-acetaminophen (NORCO/VICODIN)  5-325 MG per tablet Take 1 tablet by mouth every 4 (four) hours as needed. 12/12/14   Lenox Ahr, PA-C  lisinopril (PRINIVIL,ZESTRIL) 20 MG tablet Take 1 tablet (20 mg total) by mouth daily. 12/12/14   Lenox Ahr, PA-C  oxyCODONE-acetaminophen (PERCOCET) 5-325 MG per tablet Take 1 tablet by mouth every 4 (four) hours as needed. Patient not taking: Reported on 12/12/2014 09/29/14   Nat Christen, MD  predniSONE (DELTASONE) 10 MG tablet Take 4 tablets (40 mg total) by mouth daily. Patient not taking: Reported on 09/29/2014 08/18/14   Fredia Sorrow, MD   BP 161/108 mmHg  Pulse 94  Temp(Src) 98.5 F (36.9 C) (Oral)  Resp 14  SpO2 98%  LMP 12/10/2014 Physical Exam  Constitutional: She is oriented to person, place, and time. She appears well-developed and well-nourished.  HENT:  Head: Normocephalic and atraumatic.  Right Ear: External ear normal.  Left Ear: External ear normal.  Mouth/Throat: Oropharynx is clear and moist.  Eyes: Conjunctivae and EOM are normal. Pupils are equal, round, and reactive to light.  Neck: Normal range of motion. Neck supple.  Cardiovascular: Normal rate, regular rhythm, normal heart sounds and intact distal pulses.   Pulmonary/Chest: Breath sounds normal. No respiratory distress. She has no wheezes. She has no rales. She exhibits no tenderness.  Tachypnea   Abdominal: Soft. Bowel sounds are normal. She exhibits no distension and no mass. There is no tenderness. There is no rebound and no guarding.  Musculoskeletal: Normal range of motion. She exhibits tenderness.  Mid thoracic paraspinal TTP.    Neurological: She is alert and oriented to person, place, and time.  R leg weakness, 4/5.  No other focal motor or sensory deficits.  CN II-XII intact.    Skin: Skin is warm and dry.  Nursing note and vitals reviewed.   ED Course  Procedures (including critical care time) Labs Review Labs Reviewed - No data to display  Imaging Review Dg Chest 2 View (if  Patient Has Fever And/or Copd)  12/25/2014   CLINICAL DATA:  Cough. Short of breath. Congestion. Symptoms for 1 week. Initial encounter.  EXAM: CHEST  2 VIEW  COMPARISON:  09/29/2014.  FINDINGS: Patient is rotated to the LEFT. Cardiopericardial silhouette and mediastinal contours are within normal limits. RIGHT-greater-than-LEFT AC joint osteoarthritis. No airspace disease or effusion. Increased density is present at the LEFT apex, representing costochondral calcification and in the LEFT clavicular head projecting over the apex.  IMPRESSION: No acute cardiopulmonary disease.  No interval change.   Electronically Signed   By: Dereck Ligas M.D.   On: 12/25/2014 16:00     EKG Interpretation   Date/Time:  Saturday December 25 2014 15:41:04 EST Ventricular Rate:  83 PR Interval:  138 QRS Duration: 74 QT Interval:  396 QTC Calculation: 465 R Axis:   15 Text Interpretation:  Sinus rhythm Anteroseptal infarct, old non-spec  isolated t wave  inversion in III Confirmed by HARRISON  MD, [place] ([id number])  on 12/25/2014 4:01:22 PM      MDM   Final diagnoses:  Cough   49 yo M hx of sickle cell trait, HTN, Asthma, Depression, Obesity, CVA with R sided deficits, presents with CC of cough.    Currently on lisinopril, and states has chronic cough with this, but this is worse than usual.  Also c/o mid back pain, without acute trauma, worse with coughing or moving.  No saddle anesthesia, no bowel or bladder incontinence.  Pt has been taking tylenol for her back pain, and states over the last two days she has taken upwards of 20 tylenol over the last two days.    Physical exam as above.  No wheezing on exam, but pt tachypneic.  Pt already s/p duoneb, which may have helped with wheezing.    EKG as above, no acute changes.  CXR unremarkable, no pneumonia.  VS remain WNL.    Diagnosis of cough, likely 2/2 bronchitis. Pt also with incidental back pain, which appears related to MSK cause, myalgias, or coughing.   She has no redflags for back pain on exam or by history (no saddle anesthesia, no new focal deficit (has chronic RLE weakness 2/2 prior CVA), no paresthesias, no loss of bowel or bladder).  Pt had been taking tylenol, but under toxic dose (4 grams daily), no worry for toxicity.    Pt given additional albuterol MDI in ED to take home with her.  Rx for Prednisone for 5 day course.  Norco for back pain, and advised not to take additional tylenol.  F/u with PCP in 1 week.  Return precautions given.  Pt understands and agrees with plan.   Sinda Du   Discussed pt with attending Dr. Aline Brochure.     Sinda Du, MD 12/26/14 6060  Pamella Pert, MD 12/26/14 [phone]

## 2014-12-25 NOTE — ED Notes (Signed)
She states she her asthma has been worse this week, shes had a dry cough and fevers also. She took tylenol with relief of fever. She also c/o baCK PAIN which she feels is unrelated to her asthma.

## 2014-12-25 NOTE — Discharge Instructions (Signed)
Cough, Adult   A cough is a reflex. It helps you clear your throat and airways. A cough can help heal your body. A cough can last 2 or 3 weeks (acute) or may last more than 8 weeks (chronic). Some common causes of a cough can include an infection, allergy, or a cold.  HOME CARE  · Only take medicine as told by your doctor.  · If given, take your medicines (antibiotics) as told. Finish them even if you start to feel better.  · Use a cold steam vaporizer or humidifier in your home. This can help loosen thick spit (secretions).  · Sleep so you are almost sitting up (semi-upright). Use pillows to do this. This helps reduce coughing.  · Rest as needed.  · Stop smoking if you smoke.  GET HELP RIGHT AWAY IF:  · You have yellowish-white fluid (pus) in your thick spit.  · Your cough gets worse.  · Your medicine does not reduce coughing, and you are losing sleep.  · You cough up blood.  · You have trouble breathing.  · Your pain gets worse and medicine does not help.  · You have a fever.  MAKE SURE YOU:   · Understand these instructions.  · Will watch your condition.  · Will get help right away if you are not doing well or get worse.  Document Released: 06/21/2011 Document Revised: 02/22/2014 Document Reviewed: 06/21/2011  [employer]® Patient Information ©2015 [employer]. This information is not intended to replace advice given to you by your health care provider. Make sure you discuss any questions you have with your health care provider.

## 2014-12-25 NOTE — ED Notes (Signed)
Resident at bedside.  

## 2014-12-29 ENCOUNTER — Encounter (HOSPITAL_COMMUNITY): Payer: Self-pay

## 2014-12-29 ENCOUNTER — Emergency Department (HOSPITAL_COMMUNITY)
Admission: EM | Admit: 2014-12-29 | Discharge: 2014-12-29 | Disposition: A | Discharge status: Home / Self Care | Payer: Self-pay | Attending: Emergency Medicine | Admitting: Emergency Medicine

## 2014-12-29 DIAGNOSIS — Z79899 Other long term (current) drug therapy: Secondary | ICD-10-CM | POA: Insufficient documentation

## 2014-12-29 DIAGNOSIS — Z720 Tobacco use: Secondary | ICD-10-CM | POA: Insufficient documentation

## 2014-12-29 DIAGNOSIS — Z8673 Personal history of transient ischemic attack (TIA), and cerebral infarction without residual deficits: Secondary | ICD-10-CM | POA: Insufficient documentation

## 2014-12-29 DIAGNOSIS — Z8659 Personal history of other mental and behavioral disorders: Secondary | ICD-10-CM | POA: Insufficient documentation

## 2014-12-29 DIAGNOSIS — Z7952 Long term (current) use of systemic steroids: Secondary | ICD-10-CM | POA: Insufficient documentation

## 2014-12-29 DIAGNOSIS — Z862 Personal history of diseases of the blood and blood-forming organs and certain disorders involving the immune mechanism: Secondary | ICD-10-CM | POA: Insufficient documentation

## 2014-12-29 DIAGNOSIS — I10 Essential (primary) hypertension: Secondary | ICD-10-CM | POA: Insufficient documentation

## 2014-12-29 DIAGNOSIS — J4540 Moderate persistent asthma, uncomplicated: Secondary | ICD-10-CM | POA: Insufficient documentation

## 2014-12-29 DIAGNOSIS — G8929 Other chronic pain: Secondary | ICD-10-CM | POA: Insufficient documentation

## 2014-12-29 DIAGNOSIS — E669 Obesity, unspecified: Secondary | ICD-10-CM | POA: Insufficient documentation

## 2014-12-29 DIAGNOSIS — M549 Dorsalgia, unspecified: Secondary | ICD-10-CM | POA: Insufficient documentation

## 2014-12-29 DIAGNOSIS — Z7982 Long term (current) use of aspirin: Secondary | ICD-10-CM | POA: Insufficient documentation

## 2014-12-29 MED ORDER — HYDROCODONE-ACETAMINOPHEN 5-325 MG PO TABS: 1.0000 | ORAL_TABLET | ORAL | Status: DC | PRN

## 2014-12-29 MED ORDER — DEXAMETHASONE SODIUM PHOSPHATE 4 MG/ML IJ SOLN
8.0000 mg | Freq: Once | INTRAMUSCULAR | Status: AC
Start: 2014-12-29 — End: 2014-12-29
  Administered 2014-12-29: 8 mg via INTRAMUSCULAR
  Filled 2014-12-29: qty 2

## 2014-12-29 MED ORDER — BACLOFEN 10 MG PO TABS: 10.0000 mg | ORAL_TABLET | Freq: Three times a day (TID) | ORAL | Status: AC

## 2014-12-29 MED ORDER — HYDROCODONE-ACETAMINOPHEN 5-325 MG PO TABS
2.0000 | ORAL_TABLET | Freq: Once | ORAL | Status: AC
  Administered 2014-12-29: 2 via ORAL
  Filled 2014-12-29: qty 2

## 2014-12-29 MED ORDER — KETOROLAC TROMETHAMINE 10 MG PO TABS
10.0000 mg | ORAL_TABLET | Freq: Once | ORAL | Status: AC
  Administered 2014-12-29: 10 mg via ORAL
  Filled 2014-12-29: qty 1

## 2014-12-29 NOTE — ED Notes (Signed)
PA at bedside.

## 2014-12-29 NOTE — ED Provider Notes (Signed)
CSN: [id number]     Arrival date & time 12/29/14  1134 History   First MD Initiated Contact with Patient 12/29/14 1413     Chief Complaint  Patient presents with  . Back Pain     (Consider location/radiation/quality/duration/timing/severity/associated sxs/prior Treatment) Patient is a 49 y.o. female presenting with back pain. The history is provided by the patient.  Back Pain   Past Medical History  Diagnosis Date  . Sickle cell trait   . Hypertension   . Asthma   . Depression   . Obesity   . Stroke    Past Surgical History  Procedure Laterality Date  . No past surgeries     Family History  Problem Relation Age of Onset  . Other Neg Hx    History  Substance Use Topics  . Smoking status: Current Every Day Smoker -- 1.00 packs/day    Types: Cigarettes  . Smokeless tobacco: Never Used  . Alcohol Use: Yes     Comment: occ   OB History    Gravida Para Term Preterm AB TAB SAB Ectopic Multiple Living   8 5  5 1 1    4      Review of Systems  Musculoskeletal: Positive for back pain.      Allergies  Bee venom and Shellfish allergy  Home Medications   Prior to Admission medications   Medication Sig Start Date End Date Taking? Authorizing Provider  albuterol (PROVENTIL HFA;VENTOLIN HFA) 108 (90 BASE) MCG/ACT inhaler Inhale 2 puffs into the lungs every 4 (four) hours as needed for wheezing or shortness of breath.   Yes Historical Provider, MD  amLODipine (NORVASC) 5 MG tablet Take 5 mg by mouth daily.   Yes Historical Provider, MD  aspirin 325 MG tablet Take 1 tablet (325 mg total) by mouth daily. 05/19/14  Yes Ripudeep Krystal Eaton, MD  lisinopril (PRINIVIL,ZESTRIL) 20 MG tablet Take 1 tablet (20 mg total) by mouth daily. 12/12/14  Yes Lily Kocher, PA-C  predniSONE (DELTASONE) 20 MG tablet Take 2 tablets (40 mg total) by mouth daily. 12/25/14  Yes Sinda Du, MD  amoxicillin (AMOXIL) 500 MG capsule Take 1 capsule (500 mg total) by mouth 3 (three) times daily. Patient not  taking: Reported on 12/25/2014 12/12/14   Lily Kocher, PA-C  baclofen (LIORESAL) 10 MG tablet Take 1 tablet (10 mg total) by mouth 3 (three) times daily. 12/29/14 01/28/15  Lily Kocher, PA-C  benzonatate (TESSALON) 100 MG capsule Take 1 capsule (100 mg total) by mouth every 8 (eight) hours. Patient not taking: Reported on 12/12/2014 09/29/14   Nat Christen, MD  HYDROcodone-acetaminophen (NORCO/VICODIN) 5-325 MG per tablet Take 1 tablet by mouth every 4 (four) hours as needed. 12/29/14   Lily Kocher, PA-C  oxyCODONE-acetaminophen (PERCOCET) 5-325 MG per tablet Take 1 tablet by mouth every 4 (four) hours as needed. Patient not taking: Reported on 12/12/2014 09/29/14   Nat Christen, MD   BP 129/107 mmHg  Pulse 92  Temp(Src) 98.9 F (37.2 C) (Oral)  Resp 22  Ht 5\' 3"  (1.6 m)  Wt 237 lb (107.502 kg)  BMI 41.99 kg/m2  SpO2 100%  LMP 12/10/2014 Physical Exam  Constitutional: She is oriented to person, place, and time. She appears well-developed and well-nourished.  Non-toxic appearance.  HENT:  Head: Normocephalic.  Right Ear: Tympanic membrane and external ear normal.  Left Ear: Tympanic membrane and external ear normal.  Eyes: EOM and lids are normal. Pupils are equal, round, and reactive to light.  Neck:  Normal range of motion. Neck supple. Carotid bruit is not present.  Cardiovascular: Normal rate, regular rhythm, normal heart sounds, intact distal pulses and normal pulses.   Pulmonary/Chest: Breath sounds normal. Tachypnea noted. No respiratory distress.  Course breath sounds with occasional wheeze. Symmetrical rise and fall of the chest. Pt speaks in complete sentences.  Abdominal: Soft. Bowel sounds are normal. There is no tenderness. There is no guarding.  Musculoskeletal: Normal range of motion.       Back:  Lymphadenopathy:       Head (right side): No submandibular adenopathy present.       Head (left side): No submandibular adenopathy present.    She has no cervical adenopathy.    Neurological: She is alert and oriented to person, place, and time. She has normal strength. No cranial nerve deficit or sensory deficit.  Skin: Skin is warm and dry.  Psychiatric: She has a normal mood and affect. Her speech is normal.  Nursing note and vitals reviewed.   ED Course  Procedures (including critical care time) Labs Review Labs Reviewed - No data to display  Imaging Review No results found.   EKG Interpretation None      MDM  Patient's blood pressure is slightly elevated. The remainder the vital signs are within normal limits. Pulse oximetry is 99-100% on room air. Within normal limits by my interpretation. The examination today is negative for any acute neurologic deficit. Prescription for Norco and baclofen given to the patient. Patient states she is attempting to get her Medicaid reinstated, and does not have a primary physician at this time. I've advised the patient to follow-up at the local health department if not improving.    Final diagnoses:  Chronic back pain  Reactive airway disease, moderate persistent, uncomplicated    *I have reviewed nursing notes, vital signs, and all appropriate lab and imaging results for this patient.**    Lily Kocher, PA-C 12/31/14 2026  Milton Ferguson, MD 01/04/15 [phone]

## 2014-12-29 NOTE — ED Notes (Signed)
Reports pain to back for 2 weeks. Denies any injury. Pt reports seen at [facility]

## 2014-12-29 NOTE — Discharge Instructions (Signed)
Please use baclofen and Norco for your back pain. Please continue your prednisone and your albuterol inhaler for your cough and congestion. Heating pad to the back maybe helpful. Please call the [facility] please tablets a primary physician, or the [facility]. Back Pain, Adult Low back pain is very common. About 1 in 5 people have back pain.The cause of low back pain is rarely dangerous. The pain often gets better over time.About half of people with a sudden onset of back pain feel better in just 2 weeks. About 8 in 10 people feel better by 6 weeks.  CAUSES Some common causes of back pain include:  Strain of the muscles or ligaments supporting the spine.  Wear and tear (degeneration) of the spinal discs.  Arthritis.  Direct injury to the back. DIAGNOSIS Most of the time, the direct cause of low back pain is not known.However, back pain can be treated effectively even when the exact cause of the pain is unknown.Answering your caregiver's questions about your overall health and symptoms is one of the most accurate ways to make sure the cause of your pain is not dangerous. If your caregiver needs more information, he or she may order lab work or imaging tests (X-rays or MRIs).However, even if imaging tests show changes in your back, this usually does not require surgery. HOME CARE INSTRUCTIONS For many people, back pain returns.Since low back pain is rarely dangerous, it is often a condition that people can learn to [facility] their own.   Remain active. It is stressful on the back to sit or stand in one place. Do not sit, drive, or stand in one place for more than 30 minutes at a time. Take short walks on level surfaces as soon as pain allows.Try to increase the length of time you walk each day.  Do not stay in bed.Resting more than 1 or 2 days can delay your recovery.  Do not avoid exercise or work.Your body is made to move.It is not dangerous  to be active, even though your back may hurt.Your back will likely heal faster if you return to being active before your pain is gone.  Pay attention to your body when you bend and lift. Many people have less discomfortwhen lifting if they bend their knees, keep the load close to their bodies,and avoid twisting. Often, the most comfortable positions are those that put less stress on your recovering back.  Find a comfortable position to sleep. Use a firm mattress and lie on your side with your knees slightly bent. If you lie on your back, put a pillow under your knees.  Only take over-the-counter or prescription medicines as directed by your caregiver. Over-the-counter medicines to reduce pain and inflammation are often the most helpful.Your caregiver may prescribe muscle relaxant drugs.These medicines help dull your pain so you can more quickly return to your normal activities and healthy exercise.  Put ice on the injured area.  Put ice in a plastic bag.  Place a towel between your skin and the bag.  Leave the ice on for 15-20 minutes, 03-04 times a day for the first 2 to 3 days. After that, ice and heat may be alternated to reduce pain and spasms.  Ask your caregiver about trying back exercises and gentle massage. This may be of some benefit.  Avoid feeling anxious or stressed.Stress increases muscle tension and can worsen back pain.It is important to recognize when you are anxious or stressed and learn  ways to manage it.Exercise is a great option. SEEK MEDICAL CARE IF:  You have pain that is not relieved with rest or medicine.  You have pain that does not improve in 1 week.  You have new symptoms.  You are generally not feeling well. SEEK IMMEDIATE MEDICAL CARE IF:   You have pain that radiates from your back into your legs.  You develop new bowel or bladder control problems.  You have unusual weakness or numbness in your arms or legs.  You develop nausea or  vomiting.  You develop abdominal pain.  You feel faint. Document Released: 10/08/2005 Document Revised: 04/08/2012 Document Reviewed: 02/09/2014 [facility] Patient Information 2015 [place], [place]. This information is not intended to replace advice given to you by your health care provider. Make sure you discuss any questions you have with your health care provider.

## 2015-01-07 ENCOUNTER — Emergency Department (HOSPITAL_COMMUNITY)
Admission: EM | Admit: 2015-01-07 | Discharge: 2015-01-07 | Disposition: A | Discharge status: Home / Self Care | Payer: Self-pay | Attending: Emergency Medicine | Admitting: Emergency Medicine

## 2015-01-07 ENCOUNTER — Telehealth: Payer: Self-pay | Admitting: *Deleted

## 2015-01-07 ENCOUNTER — Encounter (HOSPITAL_COMMUNITY): Payer: Self-pay | Admitting: Emergency Medicine

## 2015-01-07 ENCOUNTER — Emergency Department (HOSPITAL_COMMUNITY): Payer: Self-pay

## 2015-01-07 DIAGNOSIS — J45909 Unspecified asthma, uncomplicated: Secondary | ICD-10-CM | POA: Insufficient documentation

## 2015-01-07 DIAGNOSIS — E669 Obesity, unspecified: Secondary | ICD-10-CM | POA: Insufficient documentation

## 2015-01-07 DIAGNOSIS — Z79899 Other long term (current) drug therapy: Secondary | ICD-10-CM | POA: Insufficient documentation

## 2015-01-07 DIAGNOSIS — I10 Essential (primary) hypertension: Secondary | ICD-10-CM | POA: Insufficient documentation

## 2015-01-07 DIAGNOSIS — Z8673 Personal history of transient ischemic attack (TIA), and cerebral infarction without residual deficits: Secondary | ICD-10-CM | POA: Insufficient documentation

## 2015-01-07 DIAGNOSIS — Z862 Personal history of diseases of the blood and blood-forming organs and certain disorders involving the immune mechanism: Secondary | ICD-10-CM | POA: Insufficient documentation

## 2015-01-07 DIAGNOSIS — K047 Periapical abscess without sinus: Secondary | ICD-10-CM | POA: Insufficient documentation

## 2015-01-07 DIAGNOSIS — Z8659 Personal history of other mental and behavioral disorders: Secondary | ICD-10-CM | POA: Insufficient documentation

## 2015-01-07 DIAGNOSIS — Z720 Tobacco use: Secondary | ICD-10-CM | POA: Insufficient documentation

## 2015-01-07 DIAGNOSIS — Z7982 Long term (current) use of aspirin: Secondary | ICD-10-CM | POA: Insufficient documentation

## 2015-01-07 DIAGNOSIS — E663 Overweight: Secondary | ICD-10-CM | POA: Insufficient documentation

## 2015-01-07 MED ORDER — OXYCODONE-ACETAMINOPHEN 5-325 MG PO TABS: 1.0000 | ORAL_TABLET | Freq: Four times a day (QID) | ORAL | Status: DC | PRN

## 2015-01-07 MED ORDER — PENICILLIN V POTASSIUM 500 MG PO TABS: 500.0000 mg | ORAL_TABLET | Freq: Four times a day (QID) | ORAL | Status: AC

## 2015-01-07 MED ORDER — IBUPROFEN 600 MG PO TABS: 600.0000 mg | ORAL_TABLET | Freq: Four times a day (QID) | ORAL | Status: DC | PRN

## 2015-01-07 MED ORDER — IOHEXOL 300 MG/ML  SOLN
100.0000 mL | Freq: Once | INTRAMUSCULAR | Status: AC | PRN
  Administered 2015-01-07: 100 mL via INTRAVENOUS

## 2015-01-07 MED ORDER — OXYCODONE-ACETAMINOPHEN 5-325 MG PO TABS
1.0000 | ORAL_TABLET | Freq: Once | ORAL | Status: AC
  Administered 2015-01-07: 1 via ORAL
  Filled 2015-01-07: qty 1

## 2015-01-07 NOTE — Discharge Instructions (Signed)
Dental Abscess A dental abscess is a collection of infected fluid (pus) from a bacterial infection in the inner part of the tooth (pulp). It usually occurs at the end of the tooth's root.  CAUSES   Severe tooth decay.  Trauma to the tooth that allows bacteria to enter into the pulp, such as a broken or chipped tooth. SYMPTOMS   Severe pain in and around the infected tooth.  Swelling and redness around the abscessed tooth or in the mouth or face.  Tenderness.  Pus drainage.  Bad breath.  Bitter taste in the mouth.  Difficulty swallowing.  Difficulty opening the mouth.  Nausea.  Vomiting.  Chills.  Swollen neck glands. DIAGNOSIS   A medical and dental history will be taken.  An examination will be performed by tapping on the abscessed tooth.  X-rays may be taken of the tooth to identify the abscess. TREATMENT The goal of treatment is to eliminate the infection. You may be prescribed antibiotic medicine to stop the infection from spreading. A root canal may be performed to save the tooth. If the tooth cannot be saved, it may be pulled (extracted) and the abscess may be drained.  HOME CARE INSTRUCTIONS  Only take over-the-counter or prescription medicines for pain, fever, or discomfort as directed by your caregiver.  Rinse your mouth (gargle) often with salt water ( tsp salt in 8 oz [250 ml] of warm water) to relieve pain or swelling.  Do not drive after taking pain medicine (narcotics).  Do not apply heat to the outside of your face.  Return to your dentist for further treatment as directed. SEEK MEDICAL CARE IF:  Your pain is not helped by medicine.  Your pain is getting worse instead of better. SEEK IMMEDIATE MEDICAL CARE IF:  You have a fever or persistent symptoms for more than 2-3 days.  You have a fever and your symptoms suddenly get worse.  You have chills or a very bad headache.  You have problems breathing or swallowing.  You have trouble  opening your mouth.  You have swelling in the neck or around the eye. Document Released: 10/08/2005 Document Revised: 07/02/2012 Document Reviewed: 01/16/2011 [facility] Patient Information 2015 [place], [place]. This information is not intended to replace advice given to you by your health care provider. Make sure you discuss any questions you have with your health care provider.   Emergency Department Resource Guide 1) Find a Doctor and Pay Out of Pocket Although you won't have to find out who is covered by your insurance plan, it is a good idea to ask around and get recommendations. You will then need to call the office and see if the doctor you have chosen will accept you as a new patient and what types of options they offer for patients who are self-pay. Some doctors offer discounts or will set up payment plans for their patients who do not have insurance, but you will need to ask so you aren't surprised when you get to your appointment.  2) Contact Your Local Health Department Not all health departments have doctors that can see patients for sick visits, but many do, so it is worth a call to see if yours does. If you don't know where your local health department is, you can check in your phone book. The CDC also has a tool to help you locate your state's health department, and many state websites also have listings of all of their local health departments.  3) Find a Walk-in  Clinic If your illness is not likely to be very severe or complicated, you may want to try a walk in clinic. These are popping up all over the country in pharmacies, drugstores, and shopping centers. They're usually staffed by nurse practitioners or physician assistants that have been trained to treat common illnesses and complaints. They're usually fairly quick and inexpensive. However, if you have serious medical issues or chronic medical problems, these are probably not your best option.  No Primary Care Doctor: - Call  Health Connect at  [phone] - they can help you locate a primary care doctor that  accepts your insurance, provides certain services, etc. - Physician Referral Service- [phone]  Chronic Pain Problems: Organization         Address  Phone   Notes  [facility]  [phone] Patients need to be referred by their primary care doctor.   Medication Assistance: Organization         Address  Phone   Notes  [facility] Medication [facility] [address] [place], [place] [address] [phone] --Must be a resident of [facility] -- Must have NO insurance coverage whatsoever (no Medicaid/ Medicare, etc.) -- The pt. MUST have a primary care doctor that directs their care regularly and follows them in the community   MedAssist  [phone]   [employer]  [phone]    Agencies that provide inexpensive medical care: Organization         Address  Phone   Notes  [place]  [phone]   [facility]    [phone]   [facility] [place], [place] [address] [phone]   [place] [address], [place] [phone]   Planned Parenthood    [phone]   [facility]    [phone]   [place] and [place] [address], [place] Phone:  [phone], Fax:  [phone] Hours of Operation:  9 am - 6 pm, M-F.  Also accepts Medicaid/Medicare and self-pay.  [facility] for [place] [address], [address], [place] Phone: [phone], Fax: [phone]. Hours of Operation:  8:30 am - 5:30 pm, M-F.  Also accepts Medicaid and self-pay.  [facility] [place] [address], [place] Phone: [phone]   [place], [place], [place] [phone], Ext. [phone] Mondays & Thursdays: 7-9 AM.  First 15 patients are seen on a first come, first serve  basis.    [place] Providers:  Organization         Address  Phone   Notes  [facility] [address], [place] [phone] Also accepts self-pay patients.  [facility] [address], [place]  [phone]   [place], [address], [place] [phone]   [facility] [address] [place] [phone]   Lucianne Lei [address], [place]   [phone] Only accepts [place] Access [place] patients after they have their name applied to their card.   Self-Pay (no insurance) in [facility]:  Organization         Address  Phone   Notes  Sickle Cell Patients, Investment banker, corporate Internal Medicine [place] (  [phone]   [facility] [place] [phone]   [facility] [place]  [place], [place], [place] [phone]   [facility]/Dr. Osei-Bonsu  [address] [place] or [address], [place] [phone] Phone number for both [place] and [place] locations is the same.  Urgent Medical and [facility] [address], [place] [phone]   [facility] [address] [place] or [address] [phone]   [facility] [address], [place] [phone], phone; [phone], fax Sees patients 1st and 3rd Saturday of every month.  Must not qualify for public or private insurance (i.e. Medicaid, Medicare, [place] Health Choice, Veterans' Benefits)  Household income should be no more than 200% of the poverty level The clinic cannot treat you if you are pregnant or think you are pregnant  Sexually transmitted diseases are not treated at the clinic.    Dental Care: Organization         Address  Phone  Notes  [facility] Department of [facility] [phone] Accepts children up to age 51 who are enrolled in [place] or [place]; pregnant women with a Medicaid card; and children who have applied for Medicaid or [place] Health Choice, but were declined, whose parents can pay a reduced fee at time of service.  [facility] Department of [facility] [place]  [address], [place] [phone] Accepts children up to age 31 who are enrolled in [place] or [place]; pregnant women with a Medicaid card; and children who have applied for Medicaid or [place] Health Choice, but were declined, whose parents can pay a reduced fee at time of service.  [place] Adult Dental Access PROGRAM  [place] [phone] Patients are seen by appointment only. Walk-ins are not accepted. [place] will see patients 79 years of age and older. Monday - Tuesday (8am-5pm) Most Wednesdays (8:30-5pm) $30 per visit, cash only  [facility]  [address], [facility] [phone] Patients are seen by appointment only. Walk-ins are not accepted. [employer] will see patients 4 years of age and older. One Wednesday Evening (Monthly: Volunteer Based).  $30 per visit, cash only  [place]  [phone] for adults; Children under age 8, call Graduate Pediatric Dentistry at [phone]. Children aged 22-14, please call [phone] to request a pediatric application.  Dental services are provided in all areas of dental care including fillings, crowns and bridges, complete and partial dentures, implants, gum treatment, root canals, and extractions. Preventive care is also provided. Treatment is provided to both adults and children. Patients are selected via a lottery and there is often a waiting list.   [facility] [address], [place]  [phone]  [email or web address]   [facility] [address] [place], [place] [phone], Ext. [phone] Second and Fourth Thursday of each month, opens at 6:30 AM; Clinic ends at 9 AM.  Patients are seen on a first-come first-served basis, and a limited number are seen during each clinic.   [facility]  [address] Hillard Danker [place], [place] [phone]   Eligibility Requirements You must have lived in [place], [place], or [place] counties  for at least the last three months.   You cannot be eligible for state or federal sponsored [employer], including [employer], [place], or [employer].   You generally cannot be eligible for healthcare insurance through your employer.    How to apply: Eligibility screenings are held every Tuesday and Wednesday afternoon from 1:00 pm until 4:00 pm. You do not need an appointment for the interview!  [facility] [address], [place], [place]   [place]  [place] Department  [place]  [phone]    Behavioral Health Resources in the Community: Intensive Outpatient Programs Organization         Address  Phone  Notes  [facility] [address], [place], [place] [phone]   [facility] [address] [place], [place]   ADS: [facility] [address], [place], [place]   [place] [address],  [place], [place] or [phone]   Substance Abuse Resources Organization         Address  Phone  Notes  Alcohol and Drug Services  [phone]   [place]  [phone]   [address]   Chinita Pester  [phone]   Residential & Outpatient Substance Abuse Program  3370656429   Psychological Services Organization          Address  Phone  Notes  [facility] [place]  [place]  [phone]   [place] [address] [place] or [phone]    Mobile Crisis Teams Organization         Address  Phone  Notes  [facility], Mobile Crisis Care Unit  [phone]   [facility]  [address] [place], [place]   [place] [address] [place] [phone]    Self-Help/Support Groups Organization         Address  Phone             Notes  [place]. of [place] - variety of support groups  [place] Call for more information  Narcotics Anonymous (NA), [facility] [address], [employer] [place]  2 meetings at this location   Special educational needs teacher         Address  Phone  Notes  [facility] Residential Treatment [place],    [place]  [phone]   [facility]  [address] [place] [address], [place], [place]   [place], [place] [phone] Admissions: 8am-3pm M-F  Incentives Substance [place] [address]    [place], [place] [phone]   [facility] [address] Jadene Pierini [place], [place]   [facility] [address],  [place], [place]   [facility]ight Programs - Intensive Outpatient [address] Kristeen [address], [place], [place]   [facility] [place].,  [place], [place] or [phone]   [facility] (RTS) [address] [place], [place] Accepts Medicaid  [facility] [place] [address]  [place] 1-7547098609 Substance Abuse/Addiction Treatment   [facility] Resources Organization         Address  Phone  Notes  [facility]  [phone]  [phone]   Domenic Schwab, PhD [address] Arlis Porta [place], [place]   [phone] or [phone]   Garvin Emerson [place], [place] [phone]   [place] [address], [place], [place] [phone] Insurance/Medicaid/sponsorship through [facility] and Families [address], [address] [place]                                    [place], [place] [phone] [place] [address], [place] [phone]    Dr. Adele Schilder  [phone]   [facility] Dept. 1) [address], [place] 2) [place] 3)  [address], [place] [phone]  [phone]   [place] [phone] or [phone] (After Hours)

## 2015-01-07 NOTE — Telephone Encounter (Signed)
Pt called stating she was prescribed penicillin and she wanted it changed to amoxicillin because she was allergic to penicillin.  Pt had pharmacy call to switch medication.

## 2015-01-07 NOTE — ED Notes (Signed)
Pt c/o continuing L upper jaw dental pain. 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 Pt states she has been unable to get in to see the dentist.

## 2015-01-07 NOTE — ED Provider Notes (Signed)
CSN: [id number]     Arrival date & time 01/07/15  0848 History  This chart was scribed for Merryl Hacker, MD by Edison Simon, ED Scribe. This patient was seen in room APA05/APA05 and the patient's care was started at 9:21 AM.    Chief Complaint  Patient presents with  . Dental Pain   The history is provided by the patient. No language interpreter was used.    HPI Comments: Taylor Vazquez is a 49 y.o. female who presents to the Emergency Department complaining of left upper dental pain; this is an ongoing problem but worsened 3 days ago. She states she has not been able to see a dentist. She reports associated headache, drainage in her throat, difficulty swallowing, and fever measured at 102. She rates pain at 10/10. She stats she has used Tylenol and Ibuprofen without improvement. She states it does improve with drinking water. She states she has a cough but notes that she smokes. She states her blood pressure has been elevated recently despite compliance with medications.  Past Medical History  Diagnosis Date  . Sickle cell trait   . Hypertension   . Asthma   . Depression   . Obesity   . Stroke    Past Surgical History  Procedure Laterality Date  . No past surgeries     Family History  Problem Relation Age of Onset  . Other Neg Hx   . Hypertension Mother   . Hypertension Father   . Hypertension Other   . Sickle cell trait Other   . Diabetes Other    History  Substance Use Topics  . Smoking status: Current Every Day Smoker -- 1.00 packs/day    Types: Cigarettes  . Smokeless tobacco: Never Used  . Alcohol Use: Yes     Comment: occ   OB History    Gravida Para Term Preterm AB TAB SAB Ectopic Multiple Living   8 5  5 1 1    4      Review of Systems  Constitutional: Positive for fever.  HENT: Positive for dental problem and sore throat.   Respiratory: Negative for cough, chest tightness and shortness of breath.   Cardiovascular: Negative for chest pain.   Gastrointestinal: Negative for nausea, vomiting and abdominal pain.  Genitourinary: Negative for dysuria.  Skin: Negative for color change and wound.  Neurological: Positive for headaches.  Psychiatric/Behavioral: Negative for confusion.  All other systems reviewed and are negative.     Allergies  Bee venom and Shellfish allergy  Home Medications   Prior to Admission medications   Medication Sig Start Date End Date Taking? Authorizing Provider  albuterol (PROVENTIL HFA;VENTOLIN HFA) 108 (90 BASE) MCG/ACT inhaler Inhale 2 puffs into the lungs every 4 (four) hours as needed for wheezing or shortness of breath.   Yes Historical Provider, MD  amLODipine (NORVASC) 5 MG tablet Take 5 mg by mouth daily.   Yes Historical Provider, MD  aspirin 325 MG tablet Take 1 tablet (325 mg total) by mouth daily. 05/19/14  Yes Ripudeep Krystal Eaton, MD  baclofen (LIORESAL) 10 MG tablet Take 1 tablet (10 mg total) by mouth 3 (three) times daily. 12/29/14 01/28/15 Yes Lily Kocher, PA-C  HYDROcodone-acetaminophen (NORCO/VICODIN) 5-325 MG per tablet Take 1 tablet by mouth every 4 (four) hours as needed. 12/29/14  Yes Lily Kocher, PA-C  lisinopril (PRINIVIL,ZESTRIL) 20 MG tablet Take 1 tablet (20 mg total) by mouth daily. 12/12/14  Yes Lily Kocher, PA-C  amoxicillin (AMOXIL) 500 MG  capsule Take 1 capsule (500 mg total) by mouth 3 (three) times daily. Patient not taking: Reported on 12/25/2014 12/12/14   Lily Kocher, PA-C  benzonatate (TESSALON) 100 MG capsule Take 1 capsule (100 mg total) by mouth every 8 (eight) hours. Patient not taking: Reported on 12/12/2014 09/29/14   Nat Christen, MD  ibuprofen (ADVIL,MOTRIN) 600 MG tablet Take 1 tablet (600 mg total) by mouth every 6 (six) hours as needed. 01/07/15   Merryl Hacker, MD  oxyCODONE-acetaminophen (PERCOCET/ROXICET) 5-325 MG per tablet Take 1 tablet by mouth every 6 (six) hours as needed for severe pain. 01/07/15   Merryl Hacker, MD  penicillin v potassium (VEETID)  500 MG tablet Take 1 tablet (500 mg total) by mouth 4 (four) times daily. 01/07/15 01/14/15  Merryl Hacker, MD  predniSONE (DELTASONE) 20 MG tablet Take 2 tablets (40 mg total) by mouth daily. Patient not taking: Reported on 01/07/2015 12/25/14   Sinda Du, MD   BP 147/73 mmHg  Pulse 88  Temp(Src) 98.2 F (36.8 C) (Oral)  Resp 18  Ht 5\' 3"  (1.6 m)  Wt 237 lb (107.502 kg)  BMI 41.99 kg/m2  SpO2 99%  LMP 12/10/2014 Physical Exam  Constitutional: She is oriented to person, place, and time. No distress.  Overweight  HENT:  Head: Normocephalic and atraumatic.  Generally poor dentition, tenderness palpation over the left upper molar, no obvious abscess, tenderness palpation over the jawline and left cheek, no trismus noted, patient able to lift tongue fully  Eyes: Pupils are equal, round, and reactive to light.  Neck: Neck supple.  Fullness over the left neck, no lymphadenopathy  Cardiovascular: Normal rate and regular rhythm.   Pulmonary/Chest: Effort normal and breath sounds normal. No respiratory distress.  Neurological: She is alert and oriented to person, place, and time.  Skin: Skin is warm and dry.  Psychiatric: She has a normal mood and affect.  Nursing note and vitals reviewed.   ED Course  Procedures (including critical care time)  COORDINATION OF CARE: 9:25 AM Discussed treatment plan with patient at beside, the patient agrees with the plan and has no further questions at this time.  12:00 PM Discussed with patient the plan to follow up with dentist. She agrees.  Labs Review Labs Reviewed - No data to display  Imaging Review Ct Soft Tissue Neck W Contrast  01/07/2015   CLINICAL DATA:  LEFT UPPER JAW PAIN AND FEVER FOR 3 DAYS. DENTAL PAIN.  EXAM: CT MAXILLOFACIAL WITH CONTRAST  CT NECK WITH CONTRAST  TECHNIQUE: Multidetector CT imaging of the maxillofacial structures was performed with intravenous contrast. Multiplanar CT image reconstructions were also generated. A  small metallic BB was placed on the right temple in order to reliably differentiate right from left.  CONTRAST:  174mL OMNIPAQUE IOHEXOL 300 MG/ML  SOLN  COMPARISON:  None.  FINDINGS: Subtle asymmetric fat infiltration lateral to the posterior left alveolar ridge of the maxilla, closely neighboring a severely cavitated 16 tooth with periapical erosion. There is no associated soft tissue abscess. No definite neighboring myositis. No affect on the airway. There are multiple dental cavities, with a large periapical erosion around the right lower central incisor, which appears loosened. Scattered condensing osteitis in the mandible and alveolar ridge from inflammation.  Clear paranasal sinuses. Negative limited intracranial imaging. There is mild enlargement of nodal tissue in the nasopharynx and oropharynx but no airway narrowing or mass. The salivary glands and thyroid glands are normal. Major cervical vessels are patent. There  is emphysematous change, paraseptal and centrilobular, in the biapical lungs. No acute osseous findings. No adenopathy.  IMPRESSION: 1. Odontogenic infection in the left face from the sixteenth tooth. No abscess. 2. Emphysema.   Electronically Signed   By: Monte Fantasia M.D.   On: 01/07/2015 11:38   Ct Maxillofacial W/cm  01/07/2015   CLINICAL DATA:  LEFT UPPER JAW PAIN AND FEVER FOR 3 DAYS. DENTAL PAIN.  EXAM: CT MAXILLOFACIAL WITH CONTRAST  CT NECK WITH CONTRAST  TECHNIQUE: Multidetector CT imaging of the maxillofacial structures was performed with intravenous contrast. Multiplanar CT image reconstructions were also generated. A small metallic BB was placed on the right temple in order to reliably differentiate right from left.  CONTRAST:  127mL OMNIPAQUE IOHEXOL 300 MG/ML  SOLN  COMPARISON:  None.  FINDINGS: Subtle asymmetric fat infiltration lateral to the posterior left alveolar ridge of the maxilla, closely neighboring a severely cavitated 16 tooth with periapical erosion. There is  no associated soft tissue abscess. No definite neighboring myositis. No affect on the airway. There are multiple dental cavities, with a large periapical erosion around the right lower central incisor, which appears loosened. Scattered condensing osteitis in the mandible and alveolar ridge from inflammation.  Clear paranasal sinuses. Negative limited intracranial imaging. There is mild enlargement of nodal tissue in the nasopharynx and oropharynx but no airway narrowing or mass. The salivary glands and thyroid glands are normal. Major cervical vessels are patent. There is emphysematous change, paraseptal and centrilobular, in the biapical lungs. No acute osseous findings. No adenopathy.  IMPRESSION: 1. Odontogenic infection in the left face from the sixteenth tooth. No abscess. 2. Emphysema.   Electronically Signed   By: Monte Fantasia M.D.   On: 01/07/2015 11:38     EKG Interpretation None      MDM   Final diagnoses:  Dental infection    Patient presents with worsening dental pain, fever, and difficulty swallowing. On exam, no obvious abscess. There is minor swelling of the left cheek and left neck. No evidence of blood wicks. CT scan obtained given her reports of difficulty swallowing and fever to rule out deep space infection. CT scan only notable for odontogenic infection of the left face.  Patient given antibiotics. Given objective evidence of infection and face, will give a short course of narcotic pain medication as well as ibuprofen. Discussed with patient and will follow-up and patient was given a Warden/ranger. Patient stated understanding.  After history, exam, and medical workup I feel the patient has been appropriately medically screened and is safe for discharge home. Pertinent diagnoses were discussed with the patient. Patient was given return precautions.  I personally performed the services described in this documentation, which was scribed in my presence. The recorded  information has been reviewed and is accurate.   Merryl Hacker, MD 01/07/15 [phone]

## 2015-02-12 NOTE — H&P (Signed)
PATIENT NAME:  Taylor Vazquez, Taylor Vazquez MR#:  [id number] DATE OF BIRTH:  1966/03/07  DATE OF ADMISSION:  06/01/2014  PRIMARY CARE PHYSICIAN: None.   REFERRING PHYSICIAN: Dr. Archie Balboa.   CHIEF COMPLAINT: Chest pain.   HISTORY OF PRESENT ILLNESS: Taylor Vazquez is a 49 year old female with a recent history of stroke per patient and was admitted to [facility] about a week back with right-sided weakness. The patient states she does not have any weakness at this time. The patient states for the last 4 to 5 days, has not been feeling well and has been spiking fever and continues to take Tylenol. Has been experiencing pain on the right side of the body. Also started to experience right-sided chest pain. Concerning this, came to the Emergency Department.  Vital signs are well within normal limits. CT head without contrast showed normal head CT. CT of the chest showed paraseptal emphysema. There was concerning about potentially small pneumothorax and possibly broncho-purulent fistula. Concerning this, the decision is made to admit the patient under observation.   PAST MEDICAL HISTORY: CVA.   PAST SURGICAL HISTORY: None.   ALLERGIES: SHELLFISH AND BEE STING.   HOME MEDICATIONS:  1. Tramadol 50 mg every 6 hours as needed. 2. Simvastatin 20 mg once a day.  3. Loratadine 10 mg once a day.  4. Lisinopril 20 mg once a day.  5. Hydrochlorothiazide 25 mg once a day.  6. Aspirin 325 mg once a day.  7. Albuterol as needed.   SOCIAL HISTORY: Quit smoking about 15 years back. Denies drinking alcohol or using illicit drugs. Used drugs in the past daily. Smoking: The patient states smokes 1 pack in 2 weeks. Denies drinking alcohol. Used drugs in the past. Currently lives with her sister and works as a Teacher, adult education.   FAMILY HISTORY: History of heart problems, cancer, diabetes mellitus.   REVIEW OF SYSTEMS:  GENERAL: Has been experiencing generalized weakness.  EYES: No change in vision.  EARS, NOSE AND  THROAT:  No change in hearing.  RESPIRATORY: Has mild cough with no productive sputum.  CARDIOVASCULAR: Has chest pain.  GASTROINTESTINAL: Has no nausea, vomiting, abdominal pain.  GENITOURINARY: No dysuria or hematuria.  HEMATOLOGIC: No easy bruising or bleeding.  ENDOCRINE: No polyuria or polydipsia.  SKIN: No rash or lesions.  MUSCULOSKELETAL: No joint pains and aches.  NEUROLOGIC: No weakness or numbness in any part of the body.   PHYSICAL EXAMINATION:  GENERAL: This well-built, well-nourished, age-appropriate female lying down in the bed, not in distress.  VITAL SIGNS: Temperature 98.1, pulse 89, blood pressure 115/68, respiratory rate of 20, oxygen saturation is 97% on room air.  HEENT: Head normocephalic, atraumatic. Eyes no scleral icterus. Conjunctivae normal. Pupils equal and react to light. Mucous membranes moist. No pharyngeal erythema.  NECK: Supple. No lymphadenopathy. No JVD. No carotid bruit.  CHEST: Has no focal tenderness.  LUNGS: Bilaterally clear to auscultation.  HEART: S1, S2 regular. No murmurs are heard.  ABDOMEN: Obese. Bowel sounds present. Soft, nontender, nondistended. No hepatosplenomegaly.  EXTREMITIES: No pedal edema. Pulses 2+.  NEUROLOGIC: The patient is alert, oriented to place, person, and time. Cranial nerves II through XII intact. Motor 5/5 in upper and lower extremities.   LABORATORY DATA: Urinalysis negative for nitrites and leukocyte esterase.   Troponin less than 0.02. CT of the chest as mentioned above. No evidence of pulmonary embolism.   Paraseptal emphysema most notably noted in the lung apices. There are multiple subcutaneous bullae of blebs. 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   ASSESSMENT AND PLAN: 1. Taylor Vazquez is a 49 year old female with continued tobacco use. 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 The patient has a very small pneumothorax. Will repeat the chest x-ray in the morning. Will also consult surgery. This is unlikely cardiac cause; however, will also cycle cardiac enzymes x 3.  2. Hypertension, currently on lisinopril and hydrochlorothiazide.  3. Adjacent history of stroke. Continue with aspirin, simvastatin and lisinopril.  4. Continue tobacco use. Counseled with the patient.  5. Obesity. Counseled with the patient regarding diet and exercise.  6. Keep the patient on deep vein thrombosis prophylaxis with Lovenox.   TIME SPENT: 50 minutes.    ____________________________ Monica Becton, MD pv:JT D: 06/01/2014 42:68:34 ET T: 06/02/2014 00:32:41 ET JOB#: [id number]  cc: Monica Becton, MD, <Dictator> Monica Becton MD ELECTRONICALLY SIGNED 06/09/2014 21:33

## 2015-02-12 NOTE — Discharge Summary (Signed)
PATIENT NAME:  Taylor Vazquez, ANDREASON MR#:  [id number] DATE OF BIRTH:  12-26-1965  DATE OF ADMISSION:  06/01/2014 DATE OF DISCHARGE:  06/02/2014  PRIMARY CARE PHYSICIAN: Dr. Larence Penning.   DISCHARGE DIAGNOSES:  1. 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  2. Hypertension.  3. Hyperlipidemia.  4. History of recent CVA with right-sided weakness.  5. Acute renal failure.   DISCHARGE MEDICATIONS:  1. Aspirin 325 mg daily. 2. Loratadine 10 mg daily.  3. Simvastatin 20 mg daily.  4. Albuterol 2 puffs 4 times daily.  5. Tramadol 50 mg q. 6 hours.  6. Lisinopril 5 mg daily. The patient advised to stop HCTZ due to renal failure.  7. The patient to [facility] on drug aerosol for her recent history of stroke.   HOSPITAL COURSE: A 49 year old female patient admitted on the 11th of August ( for chest pain. Look at the history and physical for full details. The patient admitted for right-sided chest pain. The patient's CT chest was done because x-ray of concerning  for possible pneumonia and the patient had a CT chest which showed emphysema and also concern for small pneumothorax and bronchopleural fistula. The patient placed on observation for monitoring of respiratory status and patient's troponins were followed because she complained of chest pain, and troponins were negative. The patient was seen by Dr. Genevive Bi and he suggested that the patient be stable to go home and we repeated chest x-ray today. The patient's chest x-ray shows lucency of the left apex is stable and well-defined bronchopleural fistula not seen radiographically.   LABORATORY DATA: Pertinent only for mild renal insufficiency. BUN was 17, creatinine 1.4 and she also has low potassium of 3.3. The patient was taking HCTZ at home, so I told her to stop that and she can continue other medications for her blood pressure. The patient's other diagnoses include history of cerebrovascular accident before with right-sided deficit. She  will continue physical therapy with drug aerosol. The patient on discharge, vitals, temperature 98.3, heart rate 80, blood pressure 121/75, saturations 91% on room air.   TIME SPENT: More than 30 minutes on discharge dictation.    ____________________________ Epifanio Lesches, MD sk:JT D: 06/02/2014 23:32:04 ET T: 06/03/2014 04:31:45 ET JOB#: [id number]  cc: Epifanio Lesches, MD, <Dictator> Epifanio Lesches MD ELECTRONICALLY SIGNED 07/19/2014 15:46

## 2015-03-16 ENCOUNTER — Encounter (HOSPITAL_COMMUNITY): Payer: Self-pay | Admitting: Emergency Medicine

## 2015-03-16 ENCOUNTER — Emergency Department (HOSPITAL_COMMUNITY)
Admission: EM | Admit: 2015-03-16 | Discharge: 2015-03-16 | Disposition: A | Discharge status: Home / Self Care | Payer: Self-pay | Attending: Emergency Medicine | Admitting: Emergency Medicine

## 2015-03-16 ENCOUNTER — Emergency Department (HOSPITAL_COMMUNITY): Payer: Self-pay

## 2015-03-16 DIAGNOSIS — Z720 Tobacco use: Secondary | ICD-10-CM | POA: Insufficient documentation

## 2015-03-16 DIAGNOSIS — J45909 Unspecified asthma, uncomplicated: Secondary | ICD-10-CM | POA: Insufficient documentation

## 2015-03-16 DIAGNOSIS — I10 Essential (primary) hypertension: Secondary | ICD-10-CM | POA: Insufficient documentation

## 2015-03-16 DIAGNOSIS — Z79899 Other long term (current) drug therapy: Secondary | ICD-10-CM | POA: Insufficient documentation

## 2015-03-16 DIAGNOSIS — R202 Paresthesia of skin: Secondary | ICD-10-CM | POA: Insufficient documentation

## 2015-03-16 DIAGNOSIS — F329 Major depressive disorder, single episode, unspecified: Secondary | ICD-10-CM | POA: Insufficient documentation

## 2015-03-16 DIAGNOSIS — Z792 Long term (current) use of antibiotics: Secondary | ICD-10-CM | POA: Insufficient documentation

## 2015-03-16 DIAGNOSIS — Z7952 Long term (current) use of systemic steroids: Secondary | ICD-10-CM | POA: Insufficient documentation

## 2015-03-16 DIAGNOSIS — M791 Myalgia: Secondary | ICD-10-CM | POA: Insufficient documentation

## 2015-03-16 DIAGNOSIS — E669 Obesity, unspecified: Secondary | ICD-10-CM | POA: Insufficient documentation

## 2015-03-16 DIAGNOSIS — Z8673 Personal history of transient ischemic attack (TIA), and cerebral infarction without residual deficits: Secondary | ICD-10-CM | POA: Insufficient documentation

## 2015-03-16 DIAGNOSIS — Z7982 Long term (current) use of aspirin: Secondary | ICD-10-CM | POA: Insufficient documentation

## 2015-03-16 DIAGNOSIS — R109 Unspecified abdominal pain: Secondary | ICD-10-CM | POA: Insufficient documentation

## 2015-03-16 DIAGNOSIS — Z862 Personal history of diseases of the blood and blood-forming organs and certain disorders involving the immune mechanism: Secondary | ICD-10-CM | POA: Insufficient documentation

## 2015-03-16 LAB — URINE MICROSCOPIC-ADD ON

## 2015-03-16 LAB — COMPREHENSIVE METABOLIC PANEL
ALT: 15 U/L (ref 14–54)
AST: 16 U/L (ref 15–41)
Albumin: 3.3 g/dL — ABNORMAL LOW (ref 3.5–5.0)
Alkaline Phosphatase: 97 U/L (ref 38–126)
Anion gap: 4 — ABNORMAL LOW (ref 5–15)
BILIRUBIN TOTAL: 0.3 mg/dL (ref 0.3–1.2)
BUN: 16 mg/dL (ref 6–20)
CO2: 25 mmol/L (ref 22–32)
CREATININE: 1.1 mg/dL — AB (ref 0.44–1.00)
Calcium: 8.2 mg/dL — ABNORMAL LOW (ref 8.9–10.3)
Chloride: 109 mmol/L (ref 101–111)
GFR calc Af Amer: >60 mL/min (ref >60)
GFR calc non Af Amer: 59 mL/min — ABNORMAL LOW (ref >60)
Glucose, Bld: 102 mg/dL — ABNORMAL HIGH (ref 65–99)
Potassium: 4 mmol/L (ref 3.5–5.1)
SODIUM: 138 mmol/L (ref 135–145)
Total Protein: 6.8 g/dL (ref 6.5–8.1)

## 2015-03-16 LAB — URINALYSIS, ROUTINE W REFLEX MICROSCOPIC
Bilirubin Urine: NEGATIVE
Glucose, UA: NEGATIVE mg/dL
KETONES UR: NEGATIVE mg/dL
Leukocytes, UA: NEGATIVE
NITRITE: NEGATIVE
PH: 7 (ref 5.0–8.0)
Protein, ur: 30 mg/dL — AB
SPECIFIC GRAVITY, URINE: 1.015 (ref 1.005–1.030)
UROBILINOGEN UA: 0.2 mg/dL (ref 0.0–1.0)

## 2015-03-16 LAB — CBC WITH DIFFERENTIAL/PLATELET
Basophils Absolute: 0 10*3/uL (ref 0.0–0.1)
Basophils Relative: 0 % (ref 0–1)
Eosinophils Absolute: 0.3 10*3/uL (ref 0.0–0.7)
Eosinophils Relative: 5 % (ref 0–5)
HEMATOCRIT: 44.1 % (ref 36.0–46.0)
Hemoglobin: 14.7 g/dL (ref 12.0–15.0)
Lymphocytes Relative: 40 % (ref 12–46)
Lymphs Abs: 2.6 10*3/uL (ref 0.7–4.0)
MCH: 28.5 pg (ref 26.0–34.0)
MCHC: 33.3 g/dL (ref 30.0–36.0)
MCV: 85.6 fL (ref 78.0–100.0)
MONO ABS: 0.7 10*3/uL (ref 0.1–1.0)
Monocytes Relative: 11 % (ref 3–12)
NEUTROS ABS: 2.8 10*3/uL (ref 1.7–7.7)
Neutrophils Relative %: 44 % (ref 43–77)
Platelets: 235 10*3/uL (ref 150–400)
RBC: 5.15 MIL/uL — ABNORMAL HIGH (ref 3.87–5.11)
RDW: 14.4 % (ref 11.5–15.5)
WBC: 6.4 10*3/uL (ref 4.0–10.5)

## 2015-03-16 LAB — RAPID URINE DRUG SCREEN, HOSP PERFORMED
Amphetamines: NOT DETECTED
BARBITURATES: NOT DETECTED
Benzodiazepines: NOT DETECTED
Cocaine: POSITIVE — AB
OPIATES: NOT DETECTED
TETRAHYDROCANNABINOL: NOT DETECTED

## 2015-03-16 LAB — TROPONIN I: Troponin I: <0.03 ng/mL (ref <0.031)

## 2015-03-16 NOTE — ED Provider Notes (Signed)
CSN: [id number]     Arrival date & time 03/16/15  1103 History  This chart was scribed for No att. providers found by Mercy Moore, ED scribe.  This patient was seen in room APA05/APA05 and the patient's care was started at 11:24 AM.   Chief Complaint  Patient presents with  . Numbness   The history is provided by the patient. No language interpreter was used.   HPI Comments: Taylor Vazquez is a 49 y.o. female who presents to the Emergency Department complaining of left finger numbness and tingling pain onset this morning, six hours ago. Patient reports onset of right arm and hand numbness and tingling three days ago. Patient denies loss of motor function/weakness in her hands or arms. Additional symptoms include generalized weakness, fatigue and sensation of heartburn.  Patient shares history of stroke 7 months prior. Symptoms at time included diaphoresis, weakness, pressure and heaviness in her chest. Patient reports residual left hemilateral weakness since the event.  Patient states that her intermittent sensation of heartburn over the last three days is consistent with pain felt at last stroke. Patient reports treatment with OTC antiacids, which does provide relief.  Patient denies cough, fever, urinary symptoms.    Past Medical History  Diagnosis Date  . Sickle cell trait   . Hypertension   . Asthma   . Depression   . Obesity   . Stroke    Past Surgical History  Procedure Laterality Date  . No past surgeries     Family History  Problem Relation Age of Onset  . Other Neg Hx   . Hypertension Mother   . Hypertension Father   . Hypertension Other   . Sickle cell trait Other   . Diabetes Other    History  Substance Use Topics  . Smoking status: Current Every Day Smoker -- 1.00 packs/day    Types: Cigarettes  . Smokeless tobacco: Never Used  . Alcohol Use: Yes     Comment: occ   OB History    Gravida Para Term Preterm AB TAB SAB Ectopic Multiple Living   8 5  5 1 1     4      Review of Systems  Constitutional: Positive for fatigue. Negative for fever, chills and diaphoresis.  HENT: Negative for congestion, rhinorrhea and sore throat.   Eyes: Negative for visual disturbance.  Respiratory: Negative for cough, shortness of breath and wheezing.   Gastrointestinal: Positive for abdominal pain. Negative for nausea, vomiting and diarrhea.  Endocrine: Negative for polyuria.  Genitourinary: Negative for dysuria and hematuria.  Musculoskeletal: Positive for myalgias. Negative for back pain and joint swelling.  Skin: Negative for rash.  Allergic/Immunologic: Negative for immunocompromised state.  Neurological: Positive for numbness. Negative for weakness and headaches.  Hematological: Does not bruise/bleed easily.  Psychiatric/Behavioral: Negative for confusion.      Allergies  Bee venom and Shellfish allergy  Home Medications   Prior to Admission medications   Medication Sig Start Date End Date Taking? Authorizing Provider  acetaminophen (TYLENOL) 500 MG tablet Take 1,000 mg by mouth every 6 (six) hours as needed.   Yes Historical Provider, MD  albuterol (PROVENTIL HFA;VENTOLIN HFA) 108 (90 BASE) MCG/ACT inhaler Inhale 2 puffs into the lungs every 4 (four) hours as needed for wheezing or shortness of breath.   Yes Historical Provider, MD  amLODipine (NORVASC) 5 MG tablet Take 5 mg by mouth daily.   Yes Historical Provider, MD  aspirin 325 MG tablet Take 1 tablet (325  mg total) by mouth daily. 05/19/14  Yes Ripudeep Krystal Eaton, MD  lisinopril (PRINIVIL,ZESTRIL) 20 MG tablet Take 1 tablet (20 mg total) by mouth daily. 12/12/14  Yes Lily Kocher, PA-C  amoxicillin (AMOXIL) 500 MG capsule Take 1 capsule (500 mg total) by mouth 3 (three) times daily. Patient not taking: Reported on 12/25/2014 12/12/14   Lily Kocher, PA-C  benzonatate (TESSALON) 100 MG capsule Take 1 capsule (100 mg total) by mouth every 8 (eight) hours. Patient not taking: Reported on 12/12/2014  09/29/14   Nat Christen, MD  HYDROcodone-acetaminophen (NORCO/VICODIN) 5-325 MG per tablet Take 1 tablet by mouth every 4 (four) hours as needed. Patient not taking: Reported on 03/16/2015 12/29/14   Lily Kocher, PA-C  ibuprofen (ADVIL,MOTRIN) 600 MG tablet Take 1 tablet (600 mg total) by mouth every 6 (six) hours as needed. Patient not taking: Reported on 03/16/2015 01/07/15   Merryl Hacker, MD  oxyCODONE-acetaminophen (PERCOCET/ROXICET) 5-325 MG per tablet Take 1 tablet by mouth every 6 (six) hours as needed for severe pain. Patient not taking: Reported on 03/16/2015 01/07/15   Merryl Hacker, MD  predniSONE (DELTASONE) 20 MG tablet Take 2 tablets (40 mg total) by mouth daily. Patient not taking: Reported on 01/07/2015 12/25/14   Sinda Du, MD   Triage Vitals: BP 122/70 mmHg  Pulse 100  Temp(Src) 98.4 F (36.9 C) (Oral)  Resp 18  Ht 5\' 3"  (1.6 m)  Wt 237 lb (107.502 kg)  BMI 41.99 kg/m2  SpO2 100%  LMP 01/25/2015 Physical Exam  Constitutional: She is oriented to person, place, and time. She appears well-developed and well-nourished. No distress.  HENT:  Head: Normocephalic and atraumatic.  Eyes: EOM are normal.  Neck: Neck supple. No tracheal deviation present.  Cardiovascular: Normal rate.   Pulmonary/Chest: Effort normal. No respiratory distress.  Abdominal:  Mild right quadrant tenderness.   Musculoskeletal: Normal range of motion.  Neurological: She is alert and oriented to person, place, and time.  Subjective weakness of right. Decreased strength in upper extremity. Decreased strength in lower extremity. No downward pressure in right foot when attempting to move left.   Skin: Skin is warm and dry.  Psychiatric: She has a normal mood and affect. Her behavior is normal.  Nursing note and vitals reviewed.   ED Course  Procedures (including critical care time)  COORDINATION OF CARE: 11:32 AM- Discussed treatment plan with patient and patient's parent at bedside and they  agreed to plan.   Labs Review Labs Reviewed  COMPREHENSIVE METABOLIC PANEL - Abnormal; Notable for the following:    Glucose, Bld 102 (*)    Creatinine, Ser 1.10 (*)    Calcium 8.2 (*)    Albumin 3.3 (*)    GFR calc non Af Amer 59 (*)    Anion gap 4 (*)    All other components within normal limits  CBC WITH DIFFERENTIAL/PLATELET - Abnormal; Notable for the following:    RBC 5.15 (*)    All other components within normal limits  URINALYSIS, ROUTINE W REFLEX MICROSCOPIC - Abnormal; Notable for the following:    Hgb urine dipstick SMALL (*)    Protein, ur 30 (*)    All other components within normal limits  URINE MICROSCOPIC-ADD ON - Abnormal; Notable for the following:    Squamous Epithelial / LPF FEW (*)    All other components within normal limits  TROPONIN I  URINE RAPID DRUG SCREEN (HOSP PERFORMED)    Imaging Review Dg Chest 2 View  03/16/2015  CLINICAL DATA:  Left side chest pain, generalized chest pressure  EXAM: CHEST  2 VIEW  COMPARISON:  12/25/2014  FINDINGS: Cardiomediastinal silhouette is unremarkable. No acute infiltrate or pleural effusion. No pulmonary edema. Mild degenerative changes lower thoracic spine.  IMPRESSION: No active cardiopulmonary disease.   Electronically Signed   By: Lahoma Crocker M.D.   On: 03/16/2015 12:21   Ct Head Wo Contrast  03/16/2015   CLINICAL DATA:  Acute onset of right upper extremity paresthesias, weakness and numbness 2 days ago. Current history of hypertension. Prior history of stroke.  EXAM: CT HEAD WITHOUT CONTRAST  TECHNIQUE: Contiguous axial images were obtained from the base of the skull through the vertex without intravenous contrast.  COMPARISON:  06/01/2014 dating back to 02/08/2011.  FINDINGS: Patient motion blurred many of the images initially. These were repeated and a diagnostic study was obtained.  Ventricular system normal in size and appearance for age. No mass lesion. No midline shift. No acute hemorrhage or hematoma. No  extra-axial fluid collections. No evidence of acute infarction. No focal brain parenchymal abnormalities. No significant interval change.  No focal osseous abnormalities involving the skull. Visualized paranasal sinuses, bilateral mastoid air cells, and bilateral middle ear cavities well-aerated. Mastoid air cells underpneumatized.  IMPRESSION: Normal examination.   Electronically Signed   By: Evangeline Dakin M.D.   On: 03/16/2015 12:34     EKG Interpretation   Date/Time:  Wednesday Mar 16 2015 12:26:11 EDT Ventricular Rate:  87 PR Interval:  136 QRS Duration: 70 QT Interval:  392 QTC Calculation: 472 R Axis:   5 Text Interpretation:  Sinus rhythm Probable anteroseptal infarct, old  Confirmed by Solene Hereford  MD, Kia Varnadore [phone]) on 03/16/2015 3:07:24 PM      MDM   Final diagnoses:  Paresthesias    Patient presented with paresthesias in her arms and then right arm and right side and then went to her left hand. States she's been having some difficulty walking which is not necessarily new for her. States she also has been having chest pain. Lab work is overall reassuring. Has had history of same. Also has had a questionable stroke in the past. Patient left AMA before completing her workup.  I personally performed the services described in this documentation, which was scribed in my presence. The recorded information has been reviewed and is accurate.     Davonna Belling, MD 03/16/15 [phone]

## 2015-03-16 NOTE — ED Notes (Signed)
Pt states, "I don't have time for this. I can lay around at home." Explained to pt that she had only been in the ED 1hr 45 min, and we were only awaiting one more test result. Pt still refusing to stay. EDP notived by Meriel Pica, RN. PT ambulated out after signing AMA.

## 2015-03-16 NOTE — ED Notes (Signed)
Pt reports right arm numbness and tingling since Sunday night. Pt reports left hand numbness since 5am this am. Pt reports generalized body aches and weakness. nad noted. Speech clear.

## 2015-05-06 ENCOUNTER — Emergency Department (HOSPITAL_COMMUNITY): Payer: Self-pay

## 2015-05-06 ENCOUNTER — Emergency Department (HOSPITAL_COMMUNITY)
Admission: EM | Admit: 2015-05-06 | Discharge: 2015-05-06 | Disposition: A | Discharge status: Home / Self Care | Payer: Self-pay | Attending: Emergency Medicine | Admitting: Emergency Medicine

## 2015-05-06 ENCOUNTER — Encounter (HOSPITAL_COMMUNITY): Payer: Self-pay | Admitting: *Deleted

## 2015-05-06 DIAGNOSIS — E669 Obesity, unspecified: Secondary | ICD-10-CM | POA: Insufficient documentation

## 2015-05-06 DIAGNOSIS — J45909 Unspecified asthma, uncomplicated: Secondary | ICD-10-CM

## 2015-05-06 DIAGNOSIS — Z862 Personal history of diseases of the blood and blood-forming organs and certain disorders involving the immune mechanism: Secondary | ICD-10-CM | POA: Insufficient documentation

## 2015-05-06 DIAGNOSIS — Z720 Tobacco use: Secondary | ICD-10-CM | POA: Insufficient documentation

## 2015-05-06 DIAGNOSIS — J45901 Unspecified asthma with (acute) exacerbation: Secondary | ICD-10-CM | POA: Insufficient documentation

## 2015-05-06 DIAGNOSIS — Z79899 Other long term (current) drug therapy: Secondary | ICD-10-CM | POA: Insufficient documentation

## 2015-05-06 DIAGNOSIS — I10 Essential (primary) hypertension: Secondary | ICD-10-CM | POA: Insufficient documentation

## 2015-05-06 DIAGNOSIS — R0789 Other chest pain: Secondary | ICD-10-CM

## 2015-05-06 DIAGNOSIS — R531 Weakness: Secondary | ICD-10-CM

## 2015-05-06 DIAGNOSIS — M79604 Pain in right leg: Secondary | ICD-10-CM | POA: Insufficient documentation

## 2015-05-06 DIAGNOSIS — K219 Gastro-esophageal reflux disease without esophagitis: Secondary | ICD-10-CM

## 2015-05-06 DIAGNOSIS — R51 Headache: Secondary | ICD-10-CM | POA: Insufficient documentation

## 2015-05-06 DIAGNOSIS — R519 Headache, unspecified: Secondary | ICD-10-CM

## 2015-05-06 DIAGNOSIS — Z8673 Personal history of transient ischemic attack (TIA), and cerebral infarction without residual deficits: Secondary | ICD-10-CM | POA: Insufficient documentation

## 2015-05-06 DIAGNOSIS — Z7982 Long term (current) use of aspirin: Secondary | ICD-10-CM | POA: Insufficient documentation

## 2015-05-06 DIAGNOSIS — M79605 Pain in left leg: Secondary | ICD-10-CM | POA: Insufficient documentation

## 2015-05-06 DIAGNOSIS — Z8659 Personal history of other mental and behavioral disorders: Secondary | ICD-10-CM | POA: Insufficient documentation

## 2015-05-06 LAB — BASIC METABOLIC PANEL
Anion gap: 9 (ref 5–15)
BUN: 17 mg/dL (ref 6–20)
CO2: 19 mmol/L — ABNORMAL LOW (ref 22–32)
CREATININE: 1.13 mg/dL — AB (ref 0.44–1.00)
Calcium: 8.7 mg/dL — ABNORMAL LOW (ref 8.9–10.3)
Chloride: 112 mmol/L — ABNORMAL HIGH (ref 101–111)
GFR calc non Af Amer: 57 mL/min — ABNORMAL LOW (ref >60)
GLUCOSE: 109 mg/dL — AB (ref 65–99)
POTASSIUM: 3.7 mmol/L (ref 3.5–5.1)
Sodium: 140 mmol/L (ref 135–145)

## 2015-05-06 LAB — CBC
HEMATOCRIT: 41.6 % (ref 36.0–46.0)
Hemoglobin: 14.2 g/dL (ref 12.0–15.0)
MCH: 29 pg (ref 26.0–34.0)
MCHC: 34.1 g/dL (ref 30.0–36.0)
MCV: 84.9 fL (ref 78.0–100.0)
Platelets: 215 10*3/uL (ref 150–400)
RBC: 4.9 MIL/uL (ref 3.87–5.11)
RDW: 15.6 % — AB (ref 11.5–15.5)
WBC: 8.5 10*3/uL (ref 4.0–10.5)

## 2015-05-06 LAB — TROPONIN I: Troponin I: <0.03 ng/mL (ref <0.031)

## 2015-05-06 LAB — BRAIN NATRIURETIC PEPTIDE: B Natriuretic Peptide: 29.5 pg/mL (ref 0.0–100.0)

## 2015-05-06 LAB — C-REACTIVE PROTEIN: CRP: 1.7 mg/dL — ABNORMAL HIGH (ref <1.0)

## 2015-05-06 LAB — SEDIMENTATION RATE: SED RATE: 8 mm/h (ref 0–22)

## 2015-05-06 MED ORDER — GI COCKTAIL ~~LOC~~
30.0000 mL | Freq: Once | ORAL | Status: AC
  Administered 2015-05-06: 30 mL via ORAL
  Filled 2015-05-06: qty 30

## 2015-05-06 MED ORDER — MORPHINE SULFATE 4 MG/ML IJ SOLN
4.0000 mg | Freq: Once | INTRAMUSCULAR | Status: AC
  Administered 2015-05-06: 4 mg via INTRAVENOUS
  Filled 2015-05-06: qty 1

## 2015-05-06 MED ORDER — IPRATROPIUM-ALBUTEROL 0.5-2.5 (3) MG/3ML IN SOLN
3.0000 mL | Freq: Once | RESPIRATORY_TRACT | Status: AC
  Administered 2015-05-06: 3 mL via RESPIRATORY_TRACT
  Filled 2015-05-06: qty 3

## 2015-05-06 MED ORDER — LORAZEPAM 2 MG/ML IJ SOLN
1.0000 mg | Freq: Once | INTRAMUSCULAR | Status: AC
  Administered 2015-05-06: 1 mg via INTRAVENOUS
  Filled 2015-05-06: qty 1

## 2015-05-06 MED ORDER — LORAZEPAM 2 MG/ML IJ SOLN
2.0000 mg | Freq: Once | INTRAMUSCULAR | Status: AC
  Administered 2015-05-06: 2 mg via INTRAVENOUS
  Filled 2015-05-06: qty 1

## 2015-05-06 MED ORDER — METHYLPREDNISOLONE SODIUM SUCC 125 MG IJ SOLR
125.0000 mg | Freq: Once | INTRAMUSCULAR | Status: AC
  Administered 2015-05-06: 125 mg via INTRAVENOUS
  Filled 2015-05-06: qty 2

## 2015-05-06 MED ORDER — ASPIRIN 325 MG PO TABS
325.0000 mg | ORAL_TABLET | Freq: Once | ORAL | Status: AC
  Administered 2015-05-06: 325 mg via ORAL
  Filled 2015-05-06: qty 1

## 2015-05-06 MED ORDER — SODIUM CHLORIDE 0.9 % IV BOLUS (SEPSIS)
500.0000 mL | Freq: Once | INTRAVENOUS | Status: AC
  Administered 2015-05-06: 500 mL via INTRAVENOUS

## 2015-05-06 NOTE — ED Notes (Signed)
Pt reports that she had a recent admission to [facility] for leg swelling and SOB about and was d/c about a week ago. Pt reports since d/c her legs have started to swell and have started causing her pain. Pt also reports SOB at this time. Pt alert x4. NAD at this time.

## 2015-05-06 NOTE — Discharge Instructions (Signed)
Read the information below.  You may return to the Emergency Department at any time for worsening condition or any new symptoms that concern you. °

## 2015-05-06 NOTE — ED Notes (Signed)
Mri called and stated pt not tolerating MRI.  PA notified.

## 2015-05-06 NOTE — ED Provider Notes (Signed)
Patient's care was taken over at 43 from [place], [place].  Please see initial note for full history.   Briefly, pt is a 49 yo with lots of complaints today, specifically right sided headaches and vision changes x weeks.  Ruling out temporal arteritis.  Waiting on ESR (and CRP).  To follow up with neuro and optho.   Dc with steroids if elevated inflammatory markers.  MRI negative.  Chronic right sided weakness, unchanged.  No real hx of stroke despite patient hx of stroke, per extensive Epic review by AM team.  Chest pain and SOB resolved with GI cocktail.  Recent admission to [facility] for similar sx, benign cards work up there.  Anticipate dc home with close follow up.   ESR 8, CRP 1.7.  Doubt temporal arteritis with these numbers. No indication for steroids at this time.  Ok to [employer] home.  Advised on close follow up and all questions were answered prior to dc home in stable condition.    Patient was seen with ED Attending, Dr. Elisabeth Most, MD   Tori Milks, MD 05/06/15 [place], MD 05/07/15 [id number]

## 2015-05-06 NOTE — ED Notes (Signed)
PA at bedside.

## 2015-05-06 NOTE — ED Notes (Signed)
Called MRI, they stated ready for MRI approx [id number].

## 2015-05-06 NOTE — ED Provider Notes (Signed)
CSN: [id number]     Arrival date & time 05/06/15  9798 History   First MD Initiated Contact with Patient 05/06/15 [phone]     Chief Complaint  Patient presents with  . Shortness of Breath  . Leg Pain     (Consider location/radiation/quality/duration/timing/severity/associated sxs/prior Treatment) The history is provided by medical records and the patient.     Pt with hx HTN, stroke, obesity, asthma, depression p/w multiple complaints.  For the past five days, pt has had increased SOB, cough, weakness in the right side of her body, lightheadedness/dizziness, feeling off balance.  The coughing occurs with heat and with feeling more SOB.  She developed intermittent chest pain over the past two days, described as squeezing, comes and goes randomly, not pleuritic or exertional.  Has been taking antacids without relief.  She has had swelling in her bilateral feet that is painful, improved with epsom salt soaks.  She has also had intermittent right sided headache with right eye visual cut (complete) that has also been occuring this week.    Records faxed from [facility]: CT head, motion degraded but no acute abnormality CT chest, limited but no large emboli noted D/C diagnoses: Chest pain related to pleuritis, asthma exacerbation Has appt for outpatient echo with Dr Hamilton Capri, cardiology D/C with norco, mobic, prednisone, protonix, zantac   Past Medical History  Diagnosis Date  . Sickle cell trait   . Hypertension   . Asthma   . Depression   . Obesity   . Stroke    Past Surgical History  Procedure Laterality Date  . No past surgeries     Family History  Problem Relation Age of Onset  . Other Neg Hx   . Hypertension Mother   . Hypertension Father   . Hypertension Other   . Sickle cell trait Other   . Diabetes Other    History  Substance Use Topics  . Smoking status: Current Every Day Smoker -- 1.00 packs/day    Types: Cigarettes  . Smokeless tobacco: Never Used   . Alcohol Use: Yes     Comment: occ   OB History    Gravida Para Term Preterm AB TAB SAB Ectopic Multiple Living   8 5  5 1 1    4      Review of Systems  All other systems reviewed and are negative.     Allergies  Bee venom and Shellfish allergy  Home Medications   Prior to Admission medications   Medication Sig Start Date End Date Taking? Authorizing Provider  acetaminophen (TYLENOL) 500 MG tablet Take 1,000 mg by mouth every 6 (six) hours as needed.    Historical Provider, MD  albuterol (PROVENTIL HFA;VENTOLIN HFA) 108 (90 BASE) MCG/ACT inhaler Inhale 2 puffs into the lungs every 4 (four) hours as needed for wheezing or shortness of breath.    Historical Provider, MD  amLODipine (NORVASC) 5 MG tablet Take 5 mg by mouth daily.    Historical Provider, MD  amoxicillin (AMOXIL) 500 MG capsule Take 1 capsule (500 mg total) by mouth 3 (three) times daily. Patient not taking: Reported on 12/25/2014 12/12/14   Lily Kocher, PA-C  aspirin 325 MG tablet Take 1 tablet (325 mg total) by mouth daily. 05/19/14   Ripudeep Krystal Eaton, MD  benzonatate (TESSALON) 100 MG capsule Take 1 capsule (100 mg total) by mouth every 8 (eight) hours. Patient not taking: Reported on 12/12/2014 09/29/14   Nat Christen, MD  HYDROcodone-acetaminophen (NORCO/VICODIN) 5-325 MG per  tablet Take 1 tablet by mouth every 4 (four) hours as needed. Patient not taking: Reported on 03/16/2015 12/29/14   Lily Kocher, PA-C  ibuprofen (ADVIL,MOTRIN) 600 MG tablet Take 1 tablet (600 mg total) by mouth every 6 (six) hours as needed. Patient not taking: Reported on 03/16/2015 01/07/15   Merryl Hacker, MD  lisinopril (PRINIVIL,ZESTRIL) 20 MG tablet Take 1 tablet (20 mg total) by mouth daily. 12/12/14   Lily Kocher, PA-C  oxyCODONE-acetaminophen (PERCOCET/ROXICET) 5-325 MG per tablet Take 1 tablet by mouth every 6 (six) hours as needed for severe pain. Patient not taking: Reported on 03/16/2015 01/07/15   Merryl Hacker, MD  predniSONE  (DELTASONE) 20 MG tablet Take 2 tablets (40 mg total) by mouth daily. Patient not taking: Reported on 01/07/2015 12/25/14   Sinda Du, MD   BP 135/84 mmHg  Pulse 108  Temp(Src) 98 F (36.7 C) (Oral)  Resp 22  SpO2 95% Physical Exam  Constitutional: She appears well-developed and well-nourished. No distress.  HENT:  Head: Normocephalic and atraumatic.  Neck: Neck supple.  Cardiovascular: Normal rate and regular rhythm.   Pulmonary/Chest: Effort normal and breath sounds normal. No respiratory distress. She has no wheezes. She has no rales. She exhibits tenderness (tenderness over area of pain).  Abdominal: Soft. She exhibits no distension. There is no tenderness. There is no rebound and no guarding.  Musculoskeletal: She exhibits no edema.  Neurological: She is alert. She exhibits normal muscle tone. Coordination normal. GCS eye subscore is 4. GCS verbal subscore is 5. GCS motor subscore is 6.  Weakness in RUE and RLE compared to left.  Sensation intact.  Tingling right face, pt notes decreased sensation.  Pt does note decrease peripheral vision throughout right visual field on visual field testing.  CN III-XII otherwise unremarkable.  Gait testing deferred    Skin: She is not diaphoretic.  Nursing note and vitals reviewed.   ED Course  Procedures (including critical care time) Labs Review Labs Reviewed  BASIC METABOLIC PANEL - Abnormal; Notable for the following:    Chloride 112 (*)    CO2 19 (*)    Glucose, Bld 109 (*)    Creatinine, Ser 1.13 (*)    Calcium 8.7 (*)    GFR calc non Af Amer 57 (*)    All other components within normal limits  CBC - Abnormal; Notable for the following:    RDW 15.6 (*)    All other components within normal limits  TROPONIN I  BRAIN NATRIURETIC PEPTIDE  SEDIMENTATION RATE  C-REACTIVE PROTEIN    Imaging Review Dg Chest 2 View  05/06/2015   CLINICAL DATA:  Leg swelling and shortness of breath .  EXAM: CHEST  2 VIEW  COMPARISON:  04/24/2015,  03/16/2015, 12/25/2014.  CT 04/24/2015.  FINDINGS: Mediastinum and hilar structures are normal. Stable bibasilar subsegmental atelectasis and/or pleural parenchyma scarring. Stable cardiomegaly. No pulmonary venous congestion. No pneumothorax. No acute bony abnormality .  IMPRESSION: 1. Stable bibasilar subsegmental atelectasis and/or pleural parenchymal scarring. 2. Stable cardiomegaly.  No pulmonary venous congestion.   Electronically Signed   By: Marcello Moores  Register   On: 05/06/2015 09:24   Ct Head Wo Contrast  05/06/2015   CLINICAL DATA:  Right-sided weakness.  Shortness of breath.  EXAM: CT HEAD WITHOUT CONTRAST  TECHNIQUE: Contiguous axial images were obtained from the base of the skull through the vertex without intravenous contrast.  COMPARISON:  04/24/2015  FINDINGS: There is no evidence of mass effect, midline shift or  extra-axial fluid collections. There is no evidence of a space-occupying lesion or intracranial hemorrhage. There is no evidence of a cortical-based area of acute infarction.  The ventricles and sulci are appropriate for the patient's age. The basal cisterns are patent.  Visualized portions of the orbits are unremarkable. The visualized portions of the paranasal sinuses and mastoid air cells are unremarkable.  The osseous structures are unremarkable.  IMPRESSION: Normal CT of the brain.   Electronically Signed   By: Kathreen Devoid   On: 05/06/2015 11:01   Mr Brain Wo Contrast  05/06/2015   CLINICAL DATA:  Right-sided weakness. Intermittent right eye blindness. Right visual field deficits. The examination had to be discontinued prior to completion due to patient refusal for further imaging despite maximal sedating medication.  EXAM: MRI HEAD WITHOUT CONTRAST  TECHNIQUE: Multiplanar, multiecho pulse sequences of the brain and surrounding structures were obtained without intravenous contrast.  COMPARISON:  CT head without contrast 05/06/2015  FINDINGS: The diffusion-weighted images demonstrate  is no restricted diffusion to suggest acute infarct. There is no susceptibility to suggest hemorrhage.  IMPRESSION: Normal appearance of diffusion weighted imaging of the brain.   Electronically Signed   By: San Morelle M.D.   On: 05/06/2015 15:15     EKG Interpretation   Date/Time:  Friday May 06 2015 08:28:26 EDT Ventricular Rate:  108 PR Interval:  133 QRS Duration: 72 QT Interval:  361 QTC Calculation: 484 R Axis:   0 Text Interpretation:  Sinus tachycardia Probable anteroseptal infarct, old  Prior EKG with normal rate Confirmed by DOCHERTY  MD, MEGAN ([id number]) on  05/06/2015 8:32:02 AM       10:38 AM Dr Tawnya Crook made aware of patient.   MDM   Final diagnoses:  Atypical chest pain  Asthma, unspecified asthma severity, uncomplicated  Gastroesophageal reflux disease, esophagitis presence not specified  Right sided weakness  Nonintractable episodic headache, unspecified headache type    Afebrile nontoxic patient with multiple complaints including CP, SOB, cough - was admitted at [facility] for same recently (see above), dx with pleurisy, asthma, also d/c home with reflux medications.  Pt's pain is somewhat reproducible, also with great relief with GI cocktail.  Pt also c/o right sided weakness and intermittent headache with right visual field changes.  CT head and DWI MRI (pt unable to tolerate MRI) images are negative.  I discussed this with Dr Doy Mince and states given this information we can be confident the patient has not had a stroke.  Recommends CRP and sed rate to r/o temporal arteritis.  I have also discussed this patient with Dr Tawnya Crook who has also seen the patient.  Interestingly, on chart review, pt as admitted to the hospital 04/2014 for similar symptoms and was ruled out for stroke, then stroke was entered as a medical diagnosis in her chart a few weeks later.   Anticipate discharge home with PCP follow up.  Pt just d/c from [facility] with  medications for symptoms related to chest pain.  Pt signed out to Dr Joya Gaskins pending sed rate, +/- antibiotics.  Pt will also be given ophthalmology follow up.     Clayton Bibles, PA-C 05/06/15 1652  Ernestina Patches, MD 05/07/15 1041

## 2015-06-08 ENCOUNTER — Encounter (HOSPITAL_COMMUNITY): Payer: Self-pay | Admitting: *Deleted

## 2015-06-08 ENCOUNTER — Emergency Department (HOSPITAL_COMMUNITY): Payer: MEDICAID

## 2015-06-08 ENCOUNTER — Inpatient Hospital Stay (HOSPITAL_COMMUNITY)
Admission: EM | Admit: 2015-06-08 | Discharge: 2015-06-10 | DRG: 304 | Disposition: A | Discharge status: Home / Self Care | Payer: Self-pay | Attending: Internal Medicine | Admitting: Internal Medicine

## 2015-06-08 ENCOUNTER — Emergency Department (HOSPITAL_COMMUNITY): Payer: Self-pay

## 2015-06-08 DIAGNOSIS — Z79899 Other long term (current) drug therapy: Secondary | ICD-10-CM

## 2015-06-08 DIAGNOSIS — N63 Unspecified lump in breast: Secondary | ICD-10-CM | POA: Diagnosis present

## 2015-06-08 DIAGNOSIS — J45909 Unspecified asthma, uncomplicated: Secondary | ICD-10-CM | POA: Diagnosis present

## 2015-06-08 DIAGNOSIS — F17210 Nicotine dependence, cigarettes, uncomplicated: Secondary | ICD-10-CM | POA: Diagnosis present

## 2015-06-08 DIAGNOSIS — J441 Chronic obstructive pulmonary disease with (acute) exacerbation: Secondary | ICD-10-CM | POA: Diagnosis present

## 2015-06-08 DIAGNOSIS — D573 Sickle-cell trait: Secondary | ICD-10-CM | POA: Diagnosis present

## 2015-06-08 DIAGNOSIS — I10 Essential (primary) hypertension: Principal | ICD-10-CM | POA: Diagnosis present

## 2015-06-08 DIAGNOSIS — R0602 Shortness of breath: Secondary | ICD-10-CM | POA: Diagnosis present

## 2015-06-08 DIAGNOSIS — Z8673 Personal history of transient ischemic attack (TIA), and cerebral infarction without residual deficits: Secondary | ICD-10-CM

## 2015-06-08 DIAGNOSIS — I2510 Atherosclerotic heart disease of native coronary artery without angina pectoris: Secondary | ICD-10-CM | POA: Diagnosis present

## 2015-06-08 DIAGNOSIS — R079 Chest pain, unspecified: Secondary | ICD-10-CM | POA: Diagnosis present

## 2015-06-08 DIAGNOSIS — F17200 Nicotine dependence, unspecified, uncomplicated: Secondary | ICD-10-CM | POA: Diagnosis present

## 2015-06-08 DIAGNOSIS — I5033 Acute on chronic diastolic (congestive) heart failure: Secondary | ICD-10-CM | POA: Diagnosis present

## 2015-06-08 DIAGNOSIS — Z6841 Body Mass Index (BMI) 40.0 and over, adult: Secondary | ICD-10-CM

## 2015-06-08 DIAGNOSIS — Z7951 Long term (current) use of inhaled steroids: Secondary | ICD-10-CM

## 2015-06-08 DIAGNOSIS — F1490 Cocaine use, unspecified, uncomplicated: Secondary | ICD-10-CM | POA: Diagnosis present

## 2015-06-08 DIAGNOSIS — Z79891 Long term (current) use of opiate analgesic: Secondary | ICD-10-CM

## 2015-06-08 DIAGNOSIS — Z7982 Long term (current) use of aspirin: Secondary | ICD-10-CM

## 2015-06-08 DIAGNOSIS — I5030 Unspecified diastolic (congestive) heart failure: Secondary | ICD-10-CM

## 2015-06-08 DIAGNOSIS — F329 Major depressive disorder, single episode, unspecified: Secondary | ICD-10-CM | POA: Diagnosis present

## 2015-06-08 DIAGNOSIS — R000 Tachycardia, unspecified: Secondary | ICD-10-CM

## 2015-06-08 HISTORY — DX: Heart failure, unspecified: I50.9

## 2015-06-08 LAB — BRAIN NATRIURETIC PEPTIDE: B NATRIURETIC PEPTIDE 5: 81.5 pg/mL (ref 0.0–100.0)

## 2015-06-08 LAB — BASIC METABOLIC PANEL
Anion gap: 6 (ref 5–15)
BUN: 8 mg/dL (ref 6–20)
CALCIUM: 8.6 mg/dL — AB (ref 8.9–10.3)
CO2: 21 mmol/L — ABNORMAL LOW (ref 22–32)
CREATININE: 1.03 mg/dL — AB (ref 0.44–1.00)
Chloride: 113 mmol/L — ABNORMAL HIGH (ref 101–111)
Glucose, Bld: 103 mg/dL — ABNORMAL HIGH (ref 65–99)
Potassium: 4.1 mmol/L (ref 3.5–5.1)
SODIUM: 140 mmol/L (ref 135–145)

## 2015-06-08 LAB — CBC
HCT: 43.2 % (ref 36.0–46.0)
Hemoglobin: 14 g/dL (ref 12.0–15.0)
MCH: 28.2 pg (ref 26.0–34.0)
MCHC: 32.4 g/dL (ref 30.0–36.0)
MCV: 87.1 fL (ref 78.0–100.0)
PLATELETS: 134 10*3/uL — AB (ref 150–400)
RBC: 4.96 MIL/uL (ref 3.87–5.11)
RDW: 15.1 % (ref 11.5–15.5)
WBC: 7.8 10*3/uL (ref 4.0–10.5)

## 2015-06-08 LAB — I-STAT TROPONIN, ED: TROPONIN I, POC: 0 ng/mL (ref 0.00–0.08)

## 2015-06-08 MED ORDER — PREDNISONE 20 MG PO TABS
60.0000 mg | ORAL_TABLET | Freq: Once | ORAL | Status: AC
  Administered 2015-06-08: 60 mg via ORAL
  Filled 2015-06-08: qty 3

## 2015-06-08 MED ORDER — IOHEXOL 350 MG/ML SOLN
100.0000 mL | Freq: Once | INTRAVENOUS | Status: DC | PRN
  Administered 2015-06-09: 100 mL via INTRAVENOUS
  Filled 2015-06-08: qty 100

## 2015-06-08 MED ORDER — FUROSEMIDE 10 MG/ML IJ SOLN
20.0000 mg | Freq: Once | INTRAMUSCULAR | Status: AC
  Administered 2015-06-09: 20 mg via INTRAVENOUS
  Filled 2015-06-08: qty 2

## 2015-06-08 MED ORDER — IPRATROPIUM BROMIDE 0.02 % IN SOLN
0.5000 mg | Freq: Once | RESPIRATORY_TRACT | Status: AC
  Administered 2015-06-08: 0.5 mg via RESPIRATORY_TRACT
  Filled 2015-06-08: qty 2.5

## 2015-06-08 MED ORDER — ASPIRIN 81 MG PO CHEW
324.0000 mg | CHEWABLE_TABLET | Freq: Once | ORAL | Status: AC
  Administered 2015-06-08: 324 mg via ORAL
  Filled 2015-06-08: qty 4

## 2015-06-08 MED ORDER — ALBUTEROL (5 MG/ML) CONTINUOUS INHALATION SOLN
10.0000 mg/h | INHALATION_SOLUTION | Freq: Once | RESPIRATORY_TRACT | Status: AC
  Administered 2015-06-08: 10 mg/h via RESPIRATORY_TRACT
  Filled 2015-06-08: qty 20

## 2015-06-08 MED ORDER — SODIUM CHLORIDE 0.9 % IV BOLUS (SEPSIS)
1000.0000 mL | Freq: Once | INTRAVENOUS | Status: AC
  Administered 2015-06-08: 1000 mL via INTRAVENOUS

## 2015-06-08 MED ORDER — HYDROCODONE-ACETAMINOPHEN 5-325 MG PO TABS: 2.0000 | ORAL_TABLET | Freq: Once | ORAL | Status: DC

## 2015-06-08 MED ORDER — NITROGLYCERIN 0.4 MG SL SUBL
0.4000 mg | SUBLINGUAL_TABLET | SUBLINGUAL | Status: DC | PRN
  Filled 2015-06-08: qty 1

## 2015-06-08 MED ORDER — ALBUTEROL SULFATE (2.5 MG/3ML) 0.083% IN NEBU: 2.5000 mg | INHALATION_SOLUTION | RESPIRATORY_TRACT | Status: DC | PRN

## 2015-06-08 MED ORDER — HYDROMORPHONE HCL 1 MG/ML IJ SOLN
1.0000 mg | Freq: Once | INTRAMUSCULAR | Status: AC
  Administered 2015-06-08: 1 mg via INTRAVENOUS
  Filled 2015-06-08: qty 1

## 2015-06-08 NOTE — ED Provider Notes (Signed)
CSN: [id number]     Arrival date & time 06/08/15  1322 History   First MD Initiated Contact with Patient 06/08/15 1635     Chief Complaint  Patient presents with  . Shortness of Breath     (Consider location/radiation/quality/duration/timing/severity/associated sxs/prior Treatment) HPI Comments: She reports SOB and cough for past few days. She feels like this is similar to her CHF. Last time she had this she thought it was her asthma, however she was told she had CHF. No fevers. She is also having substernal chest pain/pressure. Nonradiating. She just moved to [place] from [place] and doesn't have a PCP or a Film/video editor. She is not on any diuretics.  Patient is a 49 y.o. female presenting with shortness of breath. The history is provided by the patient.  Shortness of Breath Severity:  Moderate Onset quality:  Gradual Duration:  5 days Timing:  Constant Progression:  Unchanged Chronicity:  Recurrent Context: not URI   Relieved by:  Rest and lying down Worsened by:  Exertion (sitting up) Associated symptoms: chest pain and cough   Associated symptoms: no abdominal pain, no fever and no vomiting     Past Medical History  Diagnosis Date  . Sickle cell trait   . Hypertension   . Asthma   . Depression   . Obesity   . Stroke   . CHF (congestive heart failure)    Past Surgical History  Procedure Laterality Date  . No past surgeries     Family History  Problem Relation Age of Onset  . Other Neg Hx   . Hypertension Mother   . Hypertension Father   . Hypertension Other   . Sickle cell trait Other   . Diabetes Other    Social History  Substance Use Topics  . Smoking status: Current Every Day Smoker -- 1.00 packs/day    Types: Cigarettes  . Smokeless tobacco: Never Used  . Alcohol Use: Yes     Comment: occ   OB History    Gravida Para Term Preterm AB TAB SAB Ectopic Multiple Living   8 5  5 1 1    4      Review of Systems  Constitutional: Negative for fever and  chills.  Respiratory: Positive for cough and shortness of breath.   Cardiovascular: Positive for chest pain and leg swelling.  Gastrointestinal: Negative for vomiting and abdominal pain.  All other systems reviewed and are negative.     Allergies  Bee venom and Shellfish allergy  Home Medications   Prior to Admission medications   Medication Sig Start Date End Date Taking? Authorizing Provider  acetaminophen (TYLENOL) 500 MG tablet Take 1,000 mg by mouth every 6 (six) hours as needed for mild pain or moderate pain.     Historical Provider, MD  albuterol (PROVENTIL HFA;VENTOLIN HFA) 108 (90 BASE) MCG/ACT inhaler Inhale 2 puffs into the lungs every 4 (four) hours as needed for wheezing or shortness of breath.    Historical Provider, MD  amLODipine (NORVASC) 5 MG tablet Take 5 mg by mouth daily.    Historical Provider, MD  amoxicillin (AMOXIL) 500 MG capsule Take 1 capsule (500 mg total) by mouth 3 (three) times daily. Patient not taking: Reported on 12/25/2014 12/12/14   Lily Kocher, PA-C  aspirin 325 MG tablet Take 1 tablet (325 mg total) by mouth daily. 05/19/14   Ripudeep Krystal Eaton, MD  benzonatate (TESSALON) 100 MG capsule Take 1 capsule (100 mg total) by mouth every 8 (eight) hours. Patient  not taking: Reported on 12/12/2014 09/29/14   Nat Christen, MD  HYDROcodone-acetaminophen (NORCO/VICODIN) 5-325 MG per tablet Take 1 tablet by mouth every 4 (four) hours as needed. Patient not taking: Reported on 03/16/2015 12/29/14   Lily Kocher, PA-C  ibuprofen (ADVIL,MOTRIN) 600 MG tablet Take 1 tablet (600 mg total) by mouth every 6 (six) hours as needed. Patient not taking: Reported on 03/16/2015 01/07/15   Merryl Hacker, MD  lisinopril (PRINIVIL,ZESTRIL) 20 MG tablet Take 1 tablet (20 mg total) by mouth daily. 12/12/14   Lily Kocher, PA-C  lurasidone (LATUDA) 40 MG TABS tablet Take 40 mg by mouth daily with breakfast.    Historical Provider, MD  oxyCODONE-acetaminophen (PERCOCET/ROXICET) 5-325 MG  per tablet Take 1 tablet by mouth every 6 (six) hours as needed for severe pain. Patient not taking: Reported on 03/16/2015 01/07/15   Merryl Hacker, MD  predniSONE (DELTASONE) 20 MG tablet Take 2 tablets (40 mg total) by mouth daily. 12/25/14   Sinda Du, MD   BP 117/89 mmHg  Pulse 78  Temp(Src) 98.3 F (36.8 C) (Oral)  Resp 20  Wt 236 lb (107.049 kg)  SpO2 98%  LMP 06/08/2015 Physical Exam  Constitutional: She is oriented to person, place, and time. She appears well-developed and well-nourished. No distress.  HENT:  Head: Normocephalic and atraumatic.  Mouth/Throat: Oropharynx is clear and moist.  Eyes: EOM are normal. Pupils are equal, round, and reactive to light.  Neck: Normal range of motion. Neck supple.  Cardiovascular: Normal rate and regular rhythm.  Exam reveals no friction rub.   No murmur heard. Pulmonary/Chest: Effort normal and breath sounds normal. No respiratory distress. She has no wheezes. She has no rales.  Abdominal: Soft. She exhibits no distension. There is no tenderness. There is no rebound.  Musculoskeletal: Normal range of motion. She exhibits edema (trace, bilateral).  Neurological: She is alert and oriented to person, place, and time.  Skin: She is not diaphoretic.  Nursing note and vitals reviewed.   ED Course  Procedures (including critical care time) Labs Review Labs Reviewed  BASIC METABOLIC PANEL - Abnormal; Notable for the following:    Chloride 113 (*)    CO2 21 (*)    Glucose, Bld 103 (*)    Creatinine, Ser 1.03 (*)    Calcium 8.6 (*)    All other components within normal limits  CBC - Abnormal; Notable for the following:    Platelets 134 (*)    All other components within normal limits  I-STAT TROPOININ, ED    Imaging Review Dg Chest 2 View  06/08/2015   CLINICAL DATA:  Chest pressure and shortness breath.  EXAM: CHEST - 2 VIEW  COMPARISON:  Two-view chest x-ray 05/06/2015  FINDINGS: The heart is mildly enlarged. Ill-defined  bibasilar lower lobe airspace opacities are present. There is no edema or effusion to suggest failure. The visualized soft tissues and bony thorax are unremarkable.  IMPRESSION: 1. Mild cardiomegaly without failure. 2. Bibasilar airspace disease likely reflects atelectasis.   Electronically Signed   By: San Morelle M.D.   On: 06/08/2015 14:33   I have personally reviewed and evaluated these images and lab results as part of my medical decision-making.   EKG Interpretation   Date/Time:  Wednesday June 08 2015 13:27:48 EDT Ventricular Rate:  89 PR Interval:  132 QRS Duration: 78 QT Interval:  376 QTC Calculation: 457 R Axis:   -6 Text Interpretation:  Normal sinus rhythm Septal infarct , age  undetermined  Abnormal ECG No significant change since last tracing  Confirmed by Mingo Amber  MD, Hendryx Ricke ([id number]) on 06/08/2015 4:40:57 PM      MDM   Final diagnoses:  Tachycardia  COPD exacerbation    49 year old female here shortness of breath. Concern for CHF exacerbation. Last echo in our system was done in July 2015 and was fairly normal with EF 55-60%. She does not have a cardiologist since she's not currently on any diuretics. She's having some central substernal chest pain. Does not radiate. She has never had a cardiac cath. Here she has some Rales and wheezing on lung exam bilaterally. Will treat with broncho-dilators and sterilely its. Also check labs including a BNP and troponin. Aspirin and nitroglycerin given for her chest pain. Labs ok. CT ordered for SOB concerning for PE. Admitted for COPD exacerbation. Having chest pain, states it is pressure-like, it is reproducible, so I feel it is likely musculoskeletal.  Evelina Bucy, MD 06/09/15 0100

## 2015-06-08 NOTE — ED Notes (Signed)
Pt reports SOB and chest pressure. Pt states that this has been ongoing for several days. Pt states that she has taking her nebulizer and zantac with no relief. Pt states that SOB is worse when she moves or is flat

## 2015-06-08 NOTE — ED Notes (Signed)
EDP made aware of IV infiltration

## 2015-06-08 NOTE — H&P (Signed)
[facility] History and Physical  ZAHRAH SUTHERLIN BHA:[id number] DOB: 15-May-1966 DOA: 06/08/2015  Referring physician: EDP PCP: No PCP Per Patient   Chief Complaint: chest pressure, shortness of breath  HPI: Taylor Vazquez is a 49 y.o. female with history of asthma, obesity, hypertension, reported history of CVA/right-sided weakness with negative workup, prior history of cocaine use, frequent ER visits presents to the ER with the above complaints. Patient is a very weak historian she reports shortness of breath and chest pressure off and on for the last 2-3 days. She describes the chest pain as being sharp intermittently and sometimes like pressure, occurs at rest and with activity intermittently. She's been using her albuterol inhaler which has not been helping her at all, she reports mild lower extremity swelling. Denies any PND or orthopnea Denies any wheezing, cough, congestion,fever,  chills etc. In the ER, EDP felt that she was wheezing, he has also ordered a CTA which is pending at this time. Chest x-ray with atelectasis EKG and cardiac enzymes unremarkable    Review of Systems: Positives bolded Constitutional:  No weight loss, night sweats, Fevers, chills, fatigue.  HEENT:  No headaches, Difficulty swallowing,Tooth/dental problems,Sore throat,  No sneezing, itching, ear ache, nasal congestion, post nasal drip,  Cardio-vascular:   chest pain, Orthopnea, PND, swelling in lower extremities, anasarca, dizziness, palpitations  GI:  No heartburn, indigestion, abdominal pain, nausea, vomiting, diarrhea, change in bowel habits, loss of appetite  Resp:  shortness of breath with exertion or at rest. No excess mucus, no productive cough, No non-productive cough, No coughing up of blood.No change in color of mucus.No wheezing.No chest wall deformity  Skin:  no rash or lesions.  GU:  no dysuria, change in color of urine, no urgency or frequency. No flank pain.    Musculoskeletal:  No joint pain or swelling. No decreased range of motion. No back pain.  Psych:  No change in mood or affect. No depression or anxiety. No memory loss.   Past Medical History  Diagnosis Date  . Sickle cell trait   . Hypertension   . Asthma   . Depression   . Obesity   . Stroke   . CHF (congestive heart failure)    Past Surgical History  Procedure Laterality Date  . No past surgeries     Social History:  reports that she has been smoking Cigarettes.  She has been smoking about 1.00 pack per day. She has never used smokeless tobacco. She reports that she drinks alcohol. She reports that she does not use illicit drugs.  Allergies  Allergen Reactions  . Bee Venom Anaphylaxis  . Shellfish Allergy Anaphylaxis and Swelling    Family History  Problem Relation Age of Onset  . Other Neg Hx   . Hypertension Mother   . Hypertension Father   . Hypertension Other   . Sickle cell trait Other   . Diabetes Other     Prior to Admission medications   Medication Sig Start Date End Date Taking? Authorizing Provider  acetaminophen (TYLENOL) 500 MG tablet Take 1,000 mg by mouth every 6 (six) hours as needed for mild pain or moderate pain.    Yes Historical Provider, MD  albuterol (PROVENTIL HFA;VENTOLIN HFA) 108 (90 BASE) MCG/ACT inhaler Inhale 2 puffs into the lungs every 4 (four) hours as needed for wheezing or shortness of breath.   Yes Historical Provider, MD  amLODipine (NORVASC) 5 MG tablet Take 5 mg by mouth daily.   Yes Historical Provider,  MD  lisinopril (PRINIVIL,ZESTRIL) 20 MG tablet Take 1 tablet (20 mg total) by mouth daily. 12/12/14  Yes Lily Kocher, PA-C  lurasidone (LATUDA) 40 MG TABS tablet Take 40 mg by mouth daily with breakfast.   Yes Historical Provider, MD  predniSONE (DELTASONE) 20 MG tablet Take 2 tablets (40 mg total) by mouth daily. 12/25/14  Yes Sinda Du, MD  amoxicillin (AMOXIL) 500 MG capsule Take 1 capsule (500 mg total) by mouth 3 (three) times  daily. Patient not taking: Reported on 12/25/2014 12/12/14   Lily Kocher, PA-C  aspirin 325 MG tablet Take 1 tablet (325 mg total) by mouth daily. Patient not taking: Reported on 06/08/2015 05/19/14   Ripudeep Krystal Eaton, MD  benzonatate (TESSALON) 100 MG capsule Take 1 capsule (100 mg total) by mouth every 8 (eight) hours. Patient not taking: Reported on 12/12/2014 09/29/14   Nat Christen, MD  HYDROcodone-acetaminophen (NORCO/VICODIN) 5-325 MG per tablet Take 1 tablet by mouth every 4 (four) hours as needed. Patient not taking: Reported on 03/16/2015 12/29/14   Lily Kocher, PA-C  ibuprofen (ADVIL,MOTRIN) 600 MG tablet Take 1 tablet (600 mg total) by mouth every 6 (six) hours as needed. Patient not taking: Reported on 03/16/2015 01/07/15   Merryl Hacker, MD  oxyCODONE-acetaminophen (PERCOCET/ROXICET) 5-325 MG per tablet Take 1 tablet by mouth every 6 (six) hours as needed for severe pain. Patient not taking: Reported on 03/16/2015 01/07/15   Merryl Hacker, MD   Physical Exam: Filed Vitals:   06/08/15 2030 06/08/15 2045 06/08/15 2100 06/08/15 2115  BP: 133/82  161/92 165/87  Pulse: 117 115 115 116  Temp:      TempSrc:      Resp: 16     Weight:      SpO2: 98% 97% 95% 95%    Wt Readings from Last 3 Encounters:  06/08/15 107.049 kg (236 lb)  03/16/15 107.502 kg (237 lb)  01/07/15 107.502 kg (237 lb)    General:  Appears calm and comfortable, nondistressed obese, AO 3 Eyes: PERRL, normal lids, irises & conjunctiva ENT: grossly normal hearing, lips & tongue Neck: no LAD, masses or thyromegaly Cardiovascular: RRR, no m/r/g. Trace lower extremity edema bilaterally, pitting Telemetry: SR, no arrhythmias  Respiratory: CTA bilaterally, no w/r/r. Normal respiratory effort. Abdomen: soft, ntnd Skin: no rash or induration seen on limited exam Musculoskeletal: grossly normal tone BUE/BLE, trace pitting edema Psychiatric: grossly normal mood and affect, speech fluent and appropriate Neurologic:  grossly non-focal.          Labs on Admission:  Basic Metabolic Panel:  Recent Labs Lab 06/08/15 1346  NA 140  K 4.1  CL 113*  CO2 21*  GLUCOSE 103*  BUN 8  CREATININE 1.03*  CALCIUM 8.6*   Liver Function Tests: No results for input(s): AST, ALT, ALKPHOS, BILITOT, PROT, ALBUMIN in the last 168 hours. No results for input(s): LIPASE, AMYLASE in the last 168 hours. No results for input(s): AMMONIA in the last 168 hours. CBC:  Recent Labs Lab 06/08/15 1346  WBC 7.8  HGB 14.0  HCT 43.2  MCV 87.1  PLT 134*   Cardiac Enzymes: No results for input(s): CKTOTAL, CKMB, CKMBINDEX, TROPONINI in the last 168 hours.  BNP (last 3 results)  Recent Labs  05/06/15 0900 06/08/15 1346  BNP 29.5 81.5    ProBNP (last 3 results) No results for input(s): PROBNP in the last 8760 hours.  CBG: No results for input(s): GLUCAP in the last 168 hours.  Radiological Exams on  Admission: Dg Chest 2 View  06/08/2015   CLINICAL DATA:  Chest pressure and shortness breath.  EXAM: CHEST - 2 VIEW  COMPARISON:  Two-view chest x-ray 05/06/2015  FINDINGS: The heart is mildly enlarged. Ill-defined bibasilar lower lobe airspace opacities are present. There is no edema or effusion to suggest failure. The visualized soft tissues and bony thorax are unremarkable.  IMPRESSION: 1. Mild cardiomegaly without failure. 2. Bibasilar airspace disease likely reflects atelectasis.   Electronically Signed   By: San Morelle M.D.   On: 06/08/2015 14:33    EKG: Independently reviewed. NSR, no acute St T wave changes  Assessment/Plan   Atypical chest pain -EDP ordered at CTA will follow-up -EKG and cardiac enzymes unremarkable -Cycle troponin, check 2-D echocardiogram -She may have a component of diastolic heart failure will give Lasix 1, follow-up echo -Also check UDS history of cocaine use -Seen by cardiology last year for same, EKG enzymes and echo were unremarkable no further workup pursued  then  History of asthma -Albuterol PRN, not wheezing at this time was also given prednisone in the ER    Hypertension -Continue amlodipine    Tobacco use disorder -Counseled     Morbid obesity -Lifestyle modification  Code Status: Full Code DVT Prophylaxis: lovenox Family Communication: none at bedside Disposition Plan: observation Time spent: 51min  Taraji Mungo [facility] Pager [phone]

## 2015-06-09 ENCOUNTER — Observation Stay (HOSPITAL_COMMUNITY): Payer: Self-pay

## 2015-06-09 ENCOUNTER — Encounter (HOSPITAL_COMMUNITY): Payer: Self-pay

## 2015-06-09 DIAGNOSIS — I5030 Unspecified diastolic (congestive) heart failure: Secondary | ICD-10-CM

## 2015-06-09 DIAGNOSIS — I10 Essential (primary) hypertension: Principal | ICD-10-CM

## 2015-06-09 DIAGNOSIS — R079 Chest pain, unspecified: Secondary | ICD-10-CM

## 2015-06-09 DIAGNOSIS — I5031 Acute diastolic (congestive) heart failure: Secondary | ICD-10-CM

## 2015-06-09 DIAGNOSIS — J441 Chronic obstructive pulmonary disease with (acute) exacerbation: Secondary | ICD-10-CM | POA: Diagnosis present

## 2015-06-09 LAB — TROPONIN I: Troponin I: <0.03 ng/mL (ref <0.031)

## 2015-06-09 LAB — BASIC METABOLIC PANEL
ANION GAP: 8 (ref 5–15)
BUN: 8 mg/dL (ref 6–20)
CALCIUM: 9.1 mg/dL (ref 8.9–10.3)
CO2: 24 mmol/L (ref 22–32)
Chloride: 108 mmol/L (ref 101–111)
Creatinine, Ser: 1.1 mg/dL — ABNORMAL HIGH (ref 0.44–1.00)
GFR calc Af Amer: >60 mL/min (ref >60)
GFR, EST NON AFRICAN AMERICAN: 59 mL/min — AB (ref >60)
Glucose, Bld: 150 mg/dL — ABNORMAL HIGH (ref 65–99)
POTASSIUM: 4 mmol/L (ref 3.5–5.1)
SODIUM: 140 mmol/L (ref 135–145)

## 2015-06-09 LAB — RAPID URINE DRUG SCREEN, HOSP PERFORMED
AMPHETAMINES: NOT DETECTED
Barbiturates: NOT DETECTED
Benzodiazepines: NOT DETECTED
Cocaine: NOT DETECTED
Opiates: NOT DETECTED
Tetrahydrocannabinol: NOT DETECTED

## 2015-06-09 LAB — CBC
HCT: 42.3 % (ref 36.0–46.0)
Hemoglobin: 14.2 g/dL (ref 12.0–15.0)
MCH: 29.2 pg (ref 26.0–34.0)
MCHC: 33.6 g/dL (ref 30.0–36.0)
MCV: 86.9 fL (ref 78.0–100.0)
PLATELETS: 197 10*3/uL (ref 150–400)
RBC: 4.87 MIL/uL (ref 3.87–5.11)
RDW: 15.2 % (ref 11.5–15.5)
WBC: 8.4 10*3/uL (ref 4.0–10.5)

## 2015-06-09 LAB — GLUCOSE, CAPILLARY: GLUCOSE-CAPILLARY: 186 mg/dL — AB (ref 65–99)

## 2015-06-09 MED ORDER — IPRATROPIUM-ALBUTEROL 0.5-2.5 (3) MG/3ML IN SOLN
3.0000 mL | Freq: Four times a day (QID) | RESPIRATORY_TRACT | Status: DC
  Administered 2015-06-09 (×2): 3 mL via RESPIRATORY_TRACT
  Filled 2015-06-09 (×4): qty 3

## 2015-06-09 MED ORDER — METHYLPREDNISOLONE SODIUM SUCC 40 MG IJ SOLR
40.0000 mg | Freq: Three times a day (TID) | INTRAMUSCULAR | Status: DC
  Administered 2015-06-09 – 2015-06-10 (×3): 40 mg via INTRAVENOUS
  Filled 2015-06-09 (×7): qty 1

## 2015-06-09 MED ORDER — ASPIRIN 325 MG PO TABS
325.0000 mg | ORAL_TABLET | Freq: Every day | ORAL | Status: DC
  Administered 2015-06-09 – 2015-06-10 (×2): 325 mg via ORAL
  Filled 2015-06-09 (×2): qty 1

## 2015-06-09 MED ORDER — ONDANSETRON HCL 4 MG/2ML IJ SOLN: 4.0000 mg | Freq: Four times a day (QID) | INTRAMUSCULAR | Status: DC | PRN

## 2015-06-09 MED ORDER — ENOXAPARIN SODIUM 40 MG/0.4ML ~~LOC~~ SOLN
40.0000 mg | SUBCUTANEOUS | Status: DC
  Administered 2015-06-09 – 2015-06-10 (×2): 40 mg via SUBCUTANEOUS
  Filled 2015-06-09 (×3): qty 0.4

## 2015-06-09 MED ORDER — ACETAMINOPHEN 325 MG PO TABS
650.0000 mg | ORAL_TABLET | Freq: Four times a day (QID) | ORAL | Status: DC | PRN
  Administered 2015-06-09 – 2015-06-10 (×3): 650 mg via ORAL
  Filled 2015-06-09 (×3): qty 2

## 2015-06-09 MED ORDER — LURASIDONE HCL 40 MG PO TABS
40.0000 mg | ORAL_TABLET | Freq: Every day | ORAL | Status: DC
  Administered 2015-06-09 – 2015-06-10 (×2): 40 mg via ORAL
  Filled 2015-06-09 (×3): qty 1

## 2015-06-09 MED ORDER — ONDANSETRON HCL 4 MG PO TABS: 4.0000 mg | ORAL_TABLET | Freq: Four times a day (QID) | ORAL | Status: DC | PRN

## 2015-06-09 MED ORDER — AMLODIPINE BESYLATE 5 MG PO TABS
5.0000 mg | ORAL_TABLET | Freq: Every day | ORAL | Status: DC
  Administered 2015-06-09 – 2015-06-10 (×2): 5 mg via ORAL
  Filled 2015-06-09 (×2): qty 1

## 2015-06-09 NOTE — Care Management Note (Signed)
Case Management Note Marvetta Gibbons RN, BSN Unit 2W-Case Manager [phone]  Patient Details  Name: Taylor Vazquez MRN: [id number] Date of Birth: Apr 12, 1966  Subjective/Objective:       Pt admitted with SOB             Action/Plan:  PTA pt has been homeless- plans to go live with niece who lives in [place]- address- [address] [place] phone # pt can be reached at is [phone]  Expected Discharge Date:                  Expected Discharge Plan:  Home/Self Care  In-House Referral:  Clinical Social Work  Discharge planning Services  CM Consult, [facility], Follow-up appt scheduled, Medication Assistance  Post Acute Care Choice:    Choice offered to:     DME Arranged:    DME Agency:     HH Arranged:    [place] Agency:     Status of Service:  In process, will continue to follow  Medicare Important Message Given:    Date Medicare IM Given:    Medicare IM give by:    Date Additional Medicare IM Given:    Additional Medicare Important Message give by:     If discussed at [place] of Stay Meetings, dates discussed:    Additional Comments:  06/09/15- spoke with pt at bedside for d/c needs including PCP and potential medication needs- per conversation pt states that she is planning on staying in [place]- reports that she use to be a pt at [facility]- discussed different clinic options including [facility], [place], [facility], and [facility]. Pt is interested in the [facility] but they are not taking new pt currently- have placed call to Carmela Hurt- with [facility] - and pt will be able to go there for f/u- appointment has been made for 06/14/15 at 12N. Also talked to pt about potential medication needs- pt will be able to use the [facility] pharmacy- in looking at med list- meds are low cost and pt should be able to get them at the [facility] - if there are any high cost meds added at discharge pt would be eligible for [facility] if needed. Pt has been explained this and  verbalizes understanding.   Dawayne Patricia, RN 06/09/2015, 2:26 PM

## 2015-06-09 NOTE — Progress Notes (Signed)
Call for report R.N. unavail to give report at this time and will call me back.

## 2015-06-09 NOTE — ED Notes (Signed)
Patient transported to CT 

## 2015-06-09 NOTE — Progress Notes (Signed)
  Echocardiogram 2D Echocardiogram has been performed.  Diamond Nickel 06/09/2015, 11:41 AM

## 2015-06-09 NOTE — Hospital Discharge Follow-Up (Signed)
Transitional Care Clinic Care Coordination Note:  Admit date:  06/08/15 Discharge date: TBD Discharge Disposition: Home when stable Patient contact: [phone] (mobile) Emergency contact(s): Taylor Vazquez ([phone]  This Case Manager reviewed patient's EMR and determined patient would benefit from post-discharge medical management and chronic care management services through the [facility]. Patient has a history of HTN, Asthma, CHF, history of CVA. Patient presented with shortness of breath-probable COPD exacerbation and chest pain. Patient has had 1 admission and 6 ED visits in the last six months. This Case Manager met with patient to discuss the services and medical management that can be provided at the [facility]. Patient verbalized understanding and agreed to receive post-discharge care at the [facility].   Patient scheduled for Transitional Care appointment on 06/14/15 at 1200 with Dr. Jarold Vazquez.  Clinic information and appointment time provided to patient. Appointment information also placed on AVS.  Assessment:       Home Environment: Patient lives with her niece and her niece's children in a private residence ([address] [place], [place]).       Support System: family members       Level of functioning: independent       Home DME: walker, cane       Home care services: none       Transportation: Patient indicates she usually takes bus to medical appointments. Case Manager will follow-up with patient after discharge to ensure patient has ability to get to [facility] appointment on 06/14/15 at 1200.        Food/Nutrition: Patient indicates her niece does the grocery shopping, but patient indicates she typically cooks. She indicates she occasionally does not have enough food, but she is aware where to go when she needs additional food. She indicates she knows to go to certain churches or [employer]  if she needs access to additional food. Patient will likely need to see Social Worker at clinic for additional food bank resources. Communication sent to [facility], SW.        Medications: Patient indicates she typically uses [facility] (? [facility] [address]) for prescriptions, but she has difficulty affording medications.  Thoroughly discussed pharmacy resources available at [facility]) pharmacy. Patient plans to use [facility] pharmacy after discharge.        Identified Barriers: no insurance-patient has applied for Medicaid in the past but was declined. She plans to go to Department of Social Services after discharge and reapply for Medicaid and Disability. Also informed patient that [facility] has Development worker, community onsite and informed patient of Development worker, community Walk-in hours. Additional barriers include no PCP and inability to afford medications. Informed patient of pharmacy resources at [place].         PCP: none-Patient may need to establish care at [place] if possible after 30 days of medical management with [facility].             Arranged services:        Services communicated to Taylor Drilling, RN CM

## 2015-06-09 NOTE — Progress Notes (Signed)
Attempted to call to receive report for the 2nd time from ED Nurse. Was told that RN will call back with report.

## 2015-06-09 NOTE — Consult Note (Signed)
Cardiologist:  New  Reason for Consult: Chest Pain Referring Physician:   GERALDINE SANDBERG is an 49 y.o. female.  HPI:   The patient is an obese 49yo female with a history of HTN, tobacco abuse(one pack lasts three weeks), asthma, sickle cell trait, CHF, stroke cocaine use.  Echo in July 2015: EF 55-60%, moderate concentric LV hypertrophy.   She presented 8/17 with CP and SOB.  She reports being SOB off and on for a month or more.  One month ago she was admitted for four days a [place] [facility] for SOB.   She said they told her it was her heart. Steam from the shower helps the SOB.  Albuterol has not helped.  She has had issues with CP before but has never had a stress test.  The pain is worse when she lays down and eases when she ambulates.  Family has told her she stops breathing a night.  She sleeps on a lot of pillows.  She has periodic nausea.  Some LEE and was given lasix, according to the pt.  Troponin negative times three.  Septal Q waves are old.  UDS negative .  She was supposed to see a cardiologist in [place] but does not have transportation.   The patient currently denies vomiting, fever,  cough, congestion, abdominal pain, hematochezia, melena, claudication.    Past Medical History  Diagnosis Date  . Sickle cell trait   . Hypertension   . Asthma   . Depression   . Obesity   . Stroke   . CHF (congestive heart failure)     Past Surgical History  Procedure Laterality Date  . No past surgeries      Family History  Problem Relation Age of Onset  . Other Neg Hx   . Hypertension Mother   . Hypertension Father   . Hypertension Other   . Sickle cell trait Other   . Diabetes Other     Social History:  reports that she has been smoking Cigarettes.  She has been smoking about 1.00 pack per day. She has never used smokeless tobacco. She reports that she drinks alcohol. She reports that she does not use illicit drugs.  Allergies:  Allergies  Allergen Reactions  .  Bee Venom Anaphylaxis  . Shellfish Allergy Anaphylaxis and Swelling    Medications:  Scheduled Meds: . amLODipine  5 mg Oral Daily  . aspirin  325 mg Oral Daily  . enoxaparin (LOVENOX) injection  40 mg Subcutaneous Q24H  . ipratropium-albuterol  3 mL Nebulization QID  . lurasidone  40 mg Oral Q breakfast  . methylPREDNISolone (SOLU-MEDROL) injection  40 mg Intravenous 3 times per day   Continuous Infusions:  PRN Meds:.acetaminophen, albuterol, iohexol, nitroGLYCERIN, ondansetron **OR** ondansetron (ZOFRAN) IV   Results for orders placed or performed during the hospital encounter of 06/08/15 (from the past 48 hour(s))  Basic metabolic panel     Status: Abnormal   Collection Time: 06/08/15  1:46 PM  Result Value Ref Range   Sodium 140 135 - 145 mmol/L   Potassium 4.1 3.5 - 5.1 mmol/L   Chloride 113 (H) 101 - 111 mmol/L   CO2 21 (L) 22 - 32 mmol/L   Glucose, Bld 103 (H) 65 - 99 mg/dL   BUN 8 6 - 20 mg/dL   Creatinine, Ser 1.03 (H) 0.44 - 1.00 mg/dL   Calcium 8.6 (L) 8.9 - 10.3 mg/dL   GFR calc non Af Amer >60 >60 mL/min  GFR calc Af Amer >60 >60 mL/min    Comment: (NOTE) The eGFR has been calculated using the CKD EPI equation. This calculation has not been validated in all clinical situations. eGFR's persistently <60 mL/min signify possible Chronic Kidney Disease.    Anion gap 6 5 - 15  CBC     Status: Abnormal   Collection Time: 06/08/15  1:46 PM  Result Value Ref Range   WBC 7.8 4.0 - 10.5 K/uL   RBC 4.96 3.87 - 5.11 MIL/uL   Hemoglobin 14.0 12.0 - 15.0 g/dL   HCT 43.2 36.0 - 46.0 %   MCV 87.1 78.0 - 100.0 fL   MCH 28.2 26.0 - 34.0 pg   MCHC 32.4 30.0 - 36.0 g/dL   RDW 15.1 11.5 - 15.5 %   Platelets 134 (L) 150 - 400 K/uL  Brain natriuretic peptide     Status: None   Collection Time: 06/08/15  1:46 PM  Result Value Ref Range   B Natriuretic Peptide 81.5 0.0 - 100.0 pg/mL  I-stat troponin, ED     Status: None   Collection Time: 06/08/15  1:53 PM  Result Value  Ref Range   Troponin i, poc 0.00 0.00 - 0.08 ng/mL   Comment 3            Comment: Due to the release kinetics of cTnI, a negative result within the first hours of the onset of symptoms does not rule out myocardial infarction with certainty. If myocardial infarction is still suspected, repeat the test at appropriate intervals.   Urine rapid drug screen (hosp performed)     Status: None   Collection Time: 06/09/15  1:50 AM  Result Value Ref Range   Opiates NONE DETECTED NONE DETECTED   Cocaine NONE DETECTED NONE DETECTED   Benzodiazepines NONE DETECTED NONE DETECTED   Amphetamines NONE DETECTED NONE DETECTED   Tetrahydrocannabinol NONE DETECTED NONE DETECTED   Barbiturates NONE DETECTED NONE DETECTED    Comment:        DRUG SCREEN FOR MEDICAL PURPOSES ONLY.  IF CONFIRMATION IS NEEDED FOR ANY PURPOSE, NOTIFY LAB WITHIN 5 DAYS.        LOWEST DETECTABLE LIMITS FOR URINE DRUG SCREEN Drug Class       Cutoff (ng/mL) Amphetamine      1000 Barbiturate      200 Benzodiazepine   546 Tricyclics       270 Opiates          300 Cocaine          300 THC              50   Troponin I (q 6hr x 3)     Status: None   Collection Time: 06/09/15  1:59 AM  Result Value Ref Range   Troponin I <0.03 <0.031 ng/mL    Comment:        NO INDICATION OF MYOCARDIAL INJURY.   Troponin I (q 6hr x 3)     Status: None   Collection Time: 06/09/15  4:47 AM  Result Value Ref Range   Troponin I <0.03 <0.031 ng/mL    Comment:        NO INDICATION OF MYOCARDIAL INJURY.   CBC     Status: None   Collection Time: 06/09/15  4:47 AM  Result Value Ref Range   WBC 8.4 4.0 - 10.5 K/uL   RBC 4.87 3.87 - 5.11 MIL/uL   Hemoglobin 14.2 12.0 - 15.0 g/dL  HCT 42.3 36.0 - 46.0 %   MCV 86.9 78.0 - 100.0 fL   MCH 29.2 26.0 - 34.0 pg   MCHC 33.6 30.0 - 36.0 g/dL   RDW 15.2 11.5 - 15.5 %   Platelets 197 150 - 400 K/uL  Basic metabolic panel     Status: Abnormal   Collection Time: 06/09/15  4:47 AM  Result Value  Ref Range   Sodium 140 135 - 145 mmol/L   Potassium 4.0 3.5 - 5.1 mmol/L   Chloride 108 101 - 111 mmol/L   CO2 24 22 - 32 mmol/L   Glucose, Bld 150 (H) 65 - 99 mg/dL   BUN 8 6 - 20 mg/dL   Creatinine, Ser 1.10 (H) 0.44 - 1.00 mg/dL   Calcium 9.1 8.9 - 10.3 mg/dL   GFR calc non Af Amer 59 (L) >60 mL/min   GFR calc Af Amer >60 >60 mL/min    Comment: (NOTE) The eGFR has been calculated using the CKD EPI equation. This calculation has not been validated in all clinical situations. eGFR's persistently <60 mL/min signify possible Chronic Kidney Disease.    Anion gap 8 5 - 15    Dg Chest 2 View  06/08/2015   CLINICAL DATA:  Chest pressure and shortness breath.  EXAM: CHEST - 2 VIEW  COMPARISON:  Two-view chest x-ray 05/06/2015  FINDINGS: The heart is mildly enlarged. Ill-defined bibasilar lower lobe airspace opacities are present. There is no edema or effusion to suggest failure. The visualized soft tissues and bony thorax are unremarkable.  IMPRESSION: 1. Mild cardiomegaly without failure. 2. Bibasilar airspace disease likely reflects atelectasis.   Electronically Signed   By: San Morelle M.D.   On: 06/08/2015 14:33   Ct Angio Chest Pe W/cm &/or Wo Cm  06/09/2015   CLINICAL DATA:  Chest pressure and shortness of breath.  EXAM: CT ANGIOGRAPHY CHEST WITH CONTRAST  TECHNIQUE: Multidetector CT imaging of the chest was performed using the standard protocol during bolus administration of intravenous contrast. Multiplanar CT image reconstructions and MIPs were obtained to evaluate the vascular anatomy.  CONTRAST:  117m OMNIPAQUE IOHEXOL 350 MG/ML SOLN  COMPARISON:  04/24/2015 from [facility] FINDINGS: THORACIC INLET/BODY WALL:  There is a round mass within the upper left breast measuring 12 mm.  MEDIASTINUM:  Mild cardiomegaly. No pericardial effusion. Suboptimal evaluation the pulmonary arteries due to patient size, bolus dispersion, intermittent motion. No suspected pulmonary embolism (anterior  segment right upper lobe pulmonary artery appearance on image 92 best ascribed to motion). No aortic dissection or aneurysm. No adenopathy. Small sliding hiatal hernia.  LUNG WINDOWS:  Centrilobular and paraseptal emphysema. Ground-glass density in the setting of expiratory phase.  UPPER ABDOMEN:  No acute findings.  OSSEOUS:  No acute fracture.  No suspicious lytic or blastic lesions.  Review of the MIP images confirms the above findings.  IMPRESSION: 1. Motion degraded CTA with no evidence of pulmonary embolism or other acute disease. 2. 12 mm left breast mass.  Recommend diagnostic mammography. 3. Emphysema.   Electronically Signed   By: JMonte FantasiaM.D.   On: 06/09/2015 00:50    Review of Systems  Constitutional: Positive for weight loss. Negative for fever and diaphoresis.  HENT: Negative for congestion and sore throat.   Respiratory: Positive for shortness of breath. Negative for cough.   Cardiovascular: Positive for chest pain, orthopnea, leg swelling and PND. Negative for palpitations.  Gastrointestinal: Positive for nausea. Negative for vomiting, abdominal pain, blood in stool and  melena.  Musculoskeletal: Negative for myalgias.  Neurological: Positive for dizziness. Negative for weakness.  All other systems reviewed and are negative.  Blood pressure 145/93, pulse 90, temperature 98 F (36.7 C), temperature source Oral, resp. rate 18, height '5\' 3"'  (1.6 m), weight 236 lb (107.049 kg), last menstrual period 06/08/2015, SpO2 100 %. Physical Exam  Nursing note and vitals reviewed. Constitutional: She is oriented to person, place, and time. She appears well-developed. No distress.  Obese  HENT:  Head: Normocephalic and atraumatic.  Eyes: Pupils are equal, round, and reactive to light. No scleral icterus.  Neck: Normal range of motion. Neck supple. No JVD present.  Cardiovascular: Normal rate, regular rhythm, S1 normal and S2 normal.   No murmur heard. Pulses:      Radial pulses are  2+ on the right side, and 2+ on the left side.       Dorsalis pedis pulses are 2+ on the right side, and 2+ on the left side.  Respiratory: Effort normal and breath sounds normal. She has no wheezes. She has no rales.  GI: Soft. Bowel sounds are normal. She exhibits no distension. There is no tenderness.  Musculoskeletal: She exhibits no edema.  Lymphadenopathy:    She has no cervical adenopathy.  Neurological: She is alert and oriented to person, place, and time. She exhibits normal muscle tone.  Skin: Skin is warm and dry.  Psychiatric: She has a normal mood and affect.    Assessment/Plan: Active Problems:   Hypertension   Asthma, chronic   Tobacco use disorder   Shortness of breath   Chest pain at rest   Obese 49yo female with a history of HTN, tobacco abuse(one pack lasts three weeks), asthma, sickle cell trait, CHF, stroke cocaine use.  Echo in July 2015: EF 55-60%, moderate concentric LV hypertrophy.  She presented with SOB and CP.  CP is atypical and eases when she ambulates.   EKG is unchanged from previous.  She has septal Q waves.  Troponin is negative.  She does have risk factors for CAD.  I recommend getting a treadmill cardiolite if she can manage it, however, a lexiscan is likely a better choice.   We will review her echo, which is being done right now.    She also may have sleep apnea.  OP follow up recommended.    Tarri Fuller, [place] 06/09/2015, 10:22 AM   I have seen and examined the patient along with Tarri Fuller, PA.  I have reviewed the chart, notes and new data.  I agree with PA's note.  Key new complaints: atypical symptoms Key examination changes: no overt hypervolemia Key new findings / data: low risk ECG and biomarkers; echo shows LVH and grade 2 diastolic dysfunction (high filling pressures). No wall motion abnormalities correspond to her septal Q waves. Intermediate coronary risk factor profile  PLAN: Lexiscan Myoview. Outpatient sleep study. Smoking  cessation and abstinence from cocaine.  Sanda Klein, MD, [place] [phone] 06/09/2015, 2:26 PM

## 2015-06-09 NOTE — Progress Notes (Signed)
M.D., Patient would like a C.M. Or S.W. Consult to help with meds at home and has other finical issues. She is unemployed and has no Insurance underwriter.

## 2015-06-09 NOTE — Progress Notes (Addendum)
[facility] PROGRESS NOTE  Taylor Vazquez TWS:[id number] DOB: 07-04-1966 DOA: 06/08/2015 PCP: No PCP Per Patient  Assessment/Plan: 49 y/o female with PMH of HTN, Asthma, Chronic Tobacco use, ? H/o CVA presented with substernal chest pressure, and dyspnea   1. Chest pains. Atypical presentation. CTA: neg for PE.  ACS ruled out. Trop/ECG unremarkable. Pend echo. Patient will need evaluation for stress test. Consulted cardiology evaluation   2. H/o Asthma. Probable COPD/emphysema exacerbation. Dyspnea.  Chronic tobacco use. -we will cont steroids/changed to IV for 24-48 hrs. Start scheduled bronchodilators+prn. Counseled to stop smoking. Recommended to obtain PFs as outpatient. Pend echo to eval LV, RV function   3. HTN. Cont amlodipine, resume lisinopril in 24-48 hrs post contrast study  4. 12 mm left breast mass. d/w patient recommend diagnostic mammography. She preferred to f/u with PCP  Code Status: full Family Communication: d/w patient (indicate person spoken with, relationship, and if by phone, the number) Disposition Plan: home 24-48 hrs    Consultants:  Cardiology   Procedures:  Pend echo   Antibiotics:  none (indicate start date, and stop date if known)  HPI/Subjective: Alert, no distress. No new chest pains   Objective: Filed Vitals:   06/09/15 0652  BP: 145/93  Pulse: 90  Temp: 98 F (36.7 C)  Resp: 18    Intake/Output Summary (Last 24 hours) at 06/09/15 0945 Last data filed at 06/09/15 0700  Gross per 24 hour  Intake    240 ml  Output   1100 ml  Net   -860 ml   Filed Weights   06/08/15 1331  Weight: 107.049 kg (236 lb)    Exam:   General:  Alert, no distress   Cardiovascular: s1,s2 rrr  Respiratory: diminished LL  Abdomen: soft. Nt, nd   Musculoskeletal: no leg edema    Data Reviewed: Basic Metabolic Panel:  Recent Labs Lab 06/08/15 1346 06/09/15 0447  NA 140 140  K 4.1 4.0  CL 113* 108  CO2 21* 24  GLUCOSE 103* 150*   BUN 8 8  CREATININE 1.03* 1.10*  CALCIUM 8.6* 9.1   Liver Function Tests: No results for input(s): AST, ALT, ALKPHOS, BILITOT, PROT, ALBUMIN in the last 168 hours. No results for input(s): LIPASE, AMYLASE in the last 168 hours. No results for input(s): AMMONIA in the last 168 hours. CBC:  Recent Labs Lab 06/08/15 1346 06/09/15 0447  WBC 7.8 8.4  HGB 14.0 14.2  HCT 43.2 42.3  MCV 87.1 86.9  PLT 134* 197   Cardiac Enzymes:  Recent Labs Lab 06/09/15 0159 06/09/15 0447  TROPONINI <0.03 <0.03   BNP (last 3 results)  Recent Labs  05/06/15 0900 06/08/15 1346  BNP 29.5 81.5    ProBNP (last 3 results) No results for input(s): PROBNP in the last 8760 hours.  CBG: No results for input(s): GLUCAP in the last 168 hours.  No results found for this or any previous visit (from the past 240 hour(s)).   Studies: Dg Chest 2 View  06/08/2015   CLINICAL DATA:  Chest pressure and shortness breath.  EXAM: CHEST - 2 VIEW  COMPARISON:  Two-view chest x-ray 05/06/2015  FINDINGS: The heart is mildly enlarged. Ill-defined bibasilar lower lobe airspace opacities are present. There is no edema or effusion to suggest failure. The visualized soft tissues and bony thorax are unremarkable.  IMPRESSION: 1. Mild cardiomegaly without failure. 2. Bibasilar airspace disease likely reflects atelectasis.   Electronically Signed   By: Wynetta Fines.D.  On: 06/08/2015 14:33   Ct Angio Chest Pe W/cm &/or Wo Cm  06/09/2015   CLINICAL DATA:  Chest pressure and shortness of breath.  EXAM: CT ANGIOGRAPHY CHEST WITH CONTRAST  TECHNIQUE: Multidetector CT imaging of the chest was performed using the standard protocol during bolus administration of intravenous contrast. Multiplanar CT image reconstructions and MIPs were obtained to evaluate the vascular anatomy.  CONTRAST:  154mL OMNIPAQUE IOHEXOL 350 MG/ML SOLN  COMPARISON:  04/24/2015 from [facility]  FINDINGS: THORACIC INLET/BODY WALL:  There is a round  mass within the upper left breast measuring 12 mm.  MEDIASTINUM:  Mild cardiomegaly. No pericardial effusion. Suboptimal evaluation the pulmonary arteries due to patient size, bolus dispersion, intermittent motion. No suspected pulmonary embolism (anterior segment right upper lobe pulmonary artery appearance on image 92 best ascribed to motion). No aortic dissection or aneurysm. No adenopathy. Small sliding hiatal hernia.  LUNG WINDOWS:  Centrilobular and paraseptal emphysema. Ground-glass density in the setting of expiratory phase.  UPPER ABDOMEN:  No acute findings.  OSSEOUS:  No acute fracture.  No suspicious lytic or blastic lesions.  Review of the MIP images confirms the above findings.  IMPRESSION: 1. Motion degraded CTA with no evidence of pulmonary embolism or other acute disease. 2. 12 mm left breast mass.  Recommend diagnostic mammography. 3. Emphysema.   Electronically Signed   By: Monte Fantasia M.D.   On: 06/09/2015 00:50    Scheduled Meds: . amLODipine  5 mg Oral Daily  . aspirin  325 mg Oral Daily  . enoxaparin (LOVENOX) injection  40 mg Subcutaneous Q24H  . lurasidone  40 mg Oral Q breakfast   Continuous Infusions:   Active Problems:   Hypertension   Asthma, chronic   Tobacco use disorder   Shortness of breath   Chest pain at rest   Chest pain    Time spent: >35 minutes     Kinnie Feil  [facility] Pager [phone]. If 7PM-7AM, please contact night-coverage at [email or web address], password [facility] 06/09/2015, 9:45 AM

## 2015-06-09 NOTE — Progress Notes (Signed)
Patient c/o chest tightness 7/10. Skin warm and dry resp. Even and unlab.See flow sheet for v.s. Not the same chest pain as when came in to E.R," I don't now if it is my heart or my lungs.", patient stated. Gave one NTG S.L. With no change and as I was setting there for 5 min to see if any change she falls asleep and begins to snores. R.n> aware will cont. To monitor patient.

## 2015-06-09 NOTE — Progress Notes (Signed)
Nutrition Brief Note  Patient identified on the Malnutrition Screening Tool (MST) Report.  Wt Readings from Last 15 Encounters:  06/08/15 236 lb (107.049 kg)  03/16/15 237 lb (107.502 kg)  01/07/15 237 lb (107.502 kg)  12/29/14 237 lb (107.502 kg)  12/12/14 237 lb (107.502 kg)  11/09/14 237 lb (107.502 kg)  08/30/14 237 lb (107.502 kg)  05/19/14 237 lb 4.8 oz (107.639 kg)  08/17/13 221 lb (100.245 kg)  04/26/12 219 lb 9.6 oz (99.61 kg)  04/11/12 194 lb (87.998 kg)    Body mass index is 41.82 kg/(m^2). Patient meets criteria for Obesity Class III based on current BMI.   Current diet order is NPO.  Labs and medications reviewed.   No nutrition interventions warranted at this time. If nutrition issues arise, please consult RD.   Arthur Holms, RD, LDN Pager #: [phone] After-Hours Pager #: [phone]

## 2015-06-09 NOTE — Progress Notes (Signed)
Pt requested to speak with social work.  Wanted assistance with medications/PCP- CSW referred to [facility]  Pt also wanted help with Medicaid application- CSW provided pt with application and left messages for two financial counselors to help with Medicaid/ potentially disability.  CSW will continue to follow.  Domenica Reamer, [place] Social Worker [phone]

## 2015-06-10 ENCOUNTER — Inpatient Hospital Stay (HOSPITAL_COMMUNITY): Payer: MEDICAID

## 2015-06-10 ENCOUNTER — Inpatient Hospital Stay (HOSPITAL_COMMUNITY): Payer: Self-pay

## 2015-06-10 DIAGNOSIS — Z720 Tobacco use: Secondary | ICD-10-CM

## 2015-06-10 DIAGNOSIS — J4520 Mild intermittent asthma, uncomplicated: Secondary | ICD-10-CM

## 2015-06-10 DIAGNOSIS — R0602 Shortness of breath: Secondary | ICD-10-CM

## 2015-06-10 DIAGNOSIS — R079 Chest pain, unspecified: Secondary | ICD-10-CM

## 2015-06-10 LAB — NM MYOCAR MULTI W/SPECT W/WALL MOTION / EF
CHL CUP RESTING HR STRESS: 68 {beats}/min
MPHR: 173 {beats}/min
Peak HR: 116 {beats}/min
Percent HR: 67 %

## 2015-06-10 MED ORDER — REGADENOSON 0.4 MG/5ML IV SOLN
INTRAVENOUS | Status: AC
  Administered 2015-06-10: 0.4 mg
  Filled 2015-06-10: qty 5

## 2015-06-10 MED ORDER — TECHNETIUM TC 99M SESTAMIBI GENERIC - CARDIOLITE
10.0000 | Freq: Once | INTRAVENOUS | Status: AC | PRN
  Administered 2015-06-10: 10 via INTRAVENOUS

## 2015-06-10 MED ORDER — ASPIRIN 325 MG PO TABS
325.0000 mg | ORAL_TABLET | Freq: Every day | ORAL | Status: DC
Start: 2015-06-10 — End: 2015-09-30

## 2015-06-10 MED ORDER — PREDNISONE 20 MG PO TABS: 20.0000 mg | ORAL_TABLET | Freq: Every day | ORAL | Status: DC

## 2015-06-10 MED ORDER — AMLODIPINE BESYLATE 5 MG PO TABS: 5.0000 mg | ORAL_TABLET | Freq: Every day | ORAL | Status: DC

## 2015-06-10 MED ORDER — LISINOPRIL 20 MG PO TABS: 20.0000 mg | ORAL_TABLET | Freq: Every day | ORAL | Status: DC

## 2015-06-10 MED ORDER — LURASIDONE HCL 40 MG PO TABS: 40.0000 mg | ORAL_TABLET | Freq: Every day | ORAL | Status: DC

## 2015-06-10 MED ORDER — ACETAMINOPHEN 325 MG PO TABS: 650.0000 mg | ORAL_TABLET | Freq: Four times a day (QID) | ORAL | Status: AC | PRN

## 2015-06-10 MED ORDER — ALBUTEROL SULFATE HFA 108 (90 BASE) MCG/ACT IN AERS: 2.0000 | INHALATION_SPRAY | RESPIRATORY_TRACT | Status: DC | PRN

## 2015-06-10 MED ORDER — TECHNETIUM TC 99M SESTAMIBI GENERIC - CARDIOLITE
30.0000 | Freq: Once | INTRAVENOUS | Status: AC | PRN
  Administered 2015-06-10: 30 via INTRAVENOUS

## 2015-06-10 NOTE — Progress Notes (Signed)
Patient discharged to home.  Patient provided with bus pass prior to discharge.  IV removed prior to discharge; IV site clean, dry, and intact.  Discharge instructions, education, and medications discussed prior to discharge; patient voices understanding and denies questions or concerns.

## 2015-06-10 NOTE — Progress Notes (Signed)
Financial counseling called and plan to follow up with patient.  CSW consulted for bus pass- provided bus pass to RN  CSW signing off.  Domenica Reamer, [place] Social Worker [phone]

## 2015-06-10 NOTE — Progress Notes (Signed)
Patient Name: Taylor Vazquez Date of Encounter: 06/10/2015     Active Problems:   Hypertension   Asthma, chronic   Tobacco use disorder   Shortness of breath   Chest pain at rest   Chest pain   COPD exacerbation   Acute diastolic heart failure    SUBJECTIVE  Seen in nuclear medicine for lexiscan myoview. She had some minor chest pain last night and continued SOB. She tolerated procedure well. No acute ST or TW changes on ECG. Await images.   CURRENT MEDS . amLODipine  5 mg Oral Daily  . aspirin  325 mg Oral Daily  . enoxaparin (LOVENOX) injection  40 mg Subcutaneous Q24H  . lurasidone  40 mg Oral Q breakfast  . methylPREDNISolone (SOLU-MEDROL) injection  40 mg Intravenous 3 times per day  . regadenoson        OBJECTIVE  Filed Vitals:   06/09/15 2012 06/10/15 0240 06/10/15 0331 06/10/15 0921  BP:  129/92 145/83 147/88  Pulse:  78 82   Temp:   98.1 F (36.7 C)   TempSrc:   Oral   Resp:   18   Height:      Weight:      SpO2: 98%  97%     Intake/Output Summary (Last 24 hours) at 06/10/15 0952 Last data filed at 06/10/15 0552  Gross per 24 hour  Intake    240 ml  Output    300 ml  Net    -60 ml   Filed Weights   06/08/15 1331  Weight: 107.049 kg (236 lb)    PHYSICAL EXAM  General: Pleasant, NAD. obese Neuro: Alert and oriented X 3. Moves all extremities spontaneously. Psych: Normal affect. HEENT:  Normal  Neck: Supple without bruits or JVD. Lungs:  Resp regular and unlabored, mild crackles  Mid way up lung Heart: RRR no s3, s4, or murmurs. Abdomen: Soft, non-tender, non-distended, BS + x 4.  Extremities: No clubbing, cyanosis or edema. DP/PT/Radials 2+ and equal bilaterally.  Accessory Clinical Findings  CBC  Recent Labs  06/08/15 1346 06/09/15 0447  WBC 7.8 8.4  HGB 14.0 14.2  HCT 43.2 42.3  MCV 87.1 86.9  PLT 134* 678   Basic Metabolic Panel  Recent Labs  06/08/15 1346 06/09/15 0447  NA 140 140  K 4.1 4.0  CL 113* 108  CO2  21* 24  GLUCOSE 103* 150*  BUN 8 8  CREATININE 1.03* 1.10*  CALCIUM 8.6* 9.1   Cardiac Enzymes  Recent Labs  06/09/15 0159 06/09/15 0447 06/09/15 1245  TROPONINI <0.03 <0.03 <0.03    TELE  NSR  Radiology/Studies  Dg Chest 2 View  06/08/2015   CLINICAL DATA:  Chest pressure and shortness breath.  EXAM: CHEST - 2 VIEW  COMPARISON:  Two-view chest x-ray 05/06/2015  FINDINGS: The heart is mildly enlarged. Ill-defined bibasilar lower lobe airspace opacities are present. There is no edema or effusion to suggest failure. The visualized soft tissues and bony thorax are unremarkable.  IMPRESSION: 1. Mild cardiomegaly without failure. 2. Bibasilar airspace disease likely reflects atelectasis.   Electronically Signed   By: San Morelle M.D.   On: 06/08/2015 14:33   Ct Angio Chest Pe W/cm &/or Wo Cm  06/09/2015   CLINICAL DATA:  Chest pressure and shortness of breath.  EXAM: CT ANGIOGRAPHY CHEST WITH CONTRAST  TECHNIQUE: Multidetector CT imaging of the chest was performed using the standard protocol during bolus administration of intravenous contrast. Multiplanar CT image reconstructions  and MIPs were obtained to evaluate the vascular anatomy.  CONTRAST:  148mL OMNIPAQUE IOHEXOL 350 MG/ML SOLN  COMPARISON:  04/24/2015 from [place]  FINDINGS: THORACIC INLET/BODY WALL:  There is a round mass within the upper left breast measuring 12 mm.  MEDIASTINUM:  Mild cardiomegaly. No pericardial effusion. Suboptimal evaluation the pulmonary arteries due to patient size, bolus dispersion, intermittent motion. No suspected pulmonary embolism (anterior segment right upper lobe pulmonary artery appearance on image 92 best ascribed to motion). No aortic dissection or aneurysm. No adenopathy. Small sliding hiatal hernia.  LUNG WINDOWS:  Centrilobular and paraseptal emphysema. Ground-glass density in the setting of expiratory phase.  UPPER ABDOMEN:  No acute findings.  OSSEOUS:  No acute fracture.  No suspicious  lytic or blastic lesions.  Review of the MIP images confirms the above findings.  IMPRESSION: 1. Motion degraded CTA with no evidence of pulmonary embolism or other acute disease. 2. 12 mm left breast mass.  Recommend diagnostic mammography. 3. Emphysema.   Electronically Signed   By: Monte Fantasia M.D.   On: 06/09/2015 00:50    2D ECHO Study Date: 06/09/2015 LV EF: 55% -   60% Study Conclusions - Procedure narrative: Transthoracic echocardiography. Image   quality was adequate. The study was technically difficult. - Left ventricle: The cavity size was normal. There was mild   concentric hypertrophy. Systolic function was normal. The   estimated ejection fraction was in the range of 55% to 60%. Wall   motion was normal; there were no regional wall motion   abnormalities. Features are consistent with a pseudonormal left   ventricular filling pattern, with concomitant abnormal relaxation   and increased filling pressure (grade 2 diastolic dysfunction). - Left atrium: The atrium was mildly dilated. - Pulmonary arteries: Systolic pressure was mildly increased. PA   peak pressure: 34 mm Hg (S).    ASSESSMENT AND PLAN  Taylor Vazquez is a 49 y.o. female with a history of obesity, HTN, tobacco abuse, asthma, sickle cell trait, chronic diastolic CHF, stroke, and cocaine use who presented to [facility] on 06/08/15 with chest pain and SOB.  Chest pain- CP is atypical and eases when she ambulates.EKG is unchanged from previous. She has septal Q waves. -- Troponin is negative x3. CTA neg for PE -- Due to RFs for CAD she is undergoing lexiscan myoview today. Awaiting images. She will likely be able to go home if negative.   Chronic diastolic CHF-  echo shows LVH and grade 2 diastolic dysfunction (high filling pressures). No wall motion abnormalities correspond to her septal Q waves. -- No s/s CHF. She had one dose of lasix in the ED and feeling much better .   Possible sleep apnea- OP follow  up recommended.   Polysubstance abuse- Smoking cessation and abstinence from cocaine.  SignedEileen Stanford PA-C  Pager [phone]  I have examined the patient and reviewed assessment and plan and discussed with patient.  Agree with above as stated.  Atypical chest pain.  She is asking for Vicodin at discharge.  She does not f./u with a regular MD.  She would have to have a high risk stress test to consider cath.  Otherwise, she needs RF modification including abstaining from tobacco and cocaine.  Kirstin Kugler S.

## 2015-06-10 NOTE — Discharge Summary (Signed)
Physician Discharge Summary  Taylor Vazquez UMP:[id number] DOB: October 04, 1966 DOA: 06/08/2015  PCP: No PCP Per Patient  Admit date: 06/08/2015 Discharge date: 06/10/2015  Time spent: >35 minutes  Recommendations for Outpatient Follow-up:  F/u with PCP in 1 week   Discharge Diagnoses:  Active Problems:   Hypertension   Asthma, chronic   Tobacco use disorder   Shortness of breath   Chest pain at rest   Chest pain   COPD exacerbation   Acute diastolic heart failure   Discharge Condition: stable   Diet recommendation: low sodium   Filed Weights   06/08/15 1331  Weight: 107.049 kg (236 lb)    History of present illness  49 y/o female with PMH of HTN, Asthma, Chronic Tobacco use, ? H/o CVA presented with substernal chest pressure, and dyspnea   Hospital Course:  1. Chest pains. Atypical presentation. CTA: neg for PE. ACS ruled out. Trop/ECG unremarkable. Echo: LVEF 55-60%. there were no regional wall motion abnormalities. Patient underwent stress test, which as negative for ischemia. Cardiology recommended to f/u outpatient, avoid drug use substance abuse    2. H/o Asthma. Probable COPD/emphysema exacerbation. Dyspnea. Chronic tobacco use. -Patient clinically improved on steroids+scheduled bronchodilators+prn then transition to oral steropids to complete treatment regimen as outpatient. No wheezing on exam. Counseled to stop smoking. Recommended to obtain PFs as outpatient.  3. HTN. Stable on current regimen  4. 12 mm left breast mass. d/w patient recommend diagnostic mammography. She preferred to f/u with PCP    Procedures:  Stress test : negative for ischemia  (i.e. Studies not automatically included, echos, thoracentesis, etc; not x-rays)  Consultations:  Cardiology   Discharge Exam: Filed Vitals:   06/10/15 1016  BP: 149/96  Pulse:   Temp:   Resp:     General: alert, no distress  Cardiovascular: s1,s2 rrr Respiratory: CTA BL   Discharge  Instructions  Discharge Instructions    Diet - low sodium heart healthy    Complete by:  As directed      Discharge instructions    Complete by:  As directed   Please follow up with primary care doctor in 1 week     Increase activity slowly    Complete by:  As directed             Medication List    STOP taking these medications        amoxicillin 500 MG capsule  Commonly known as:  AMOXIL     benzonatate 100 MG capsule  Commonly known as:  TESSALON     HYDROcodone-acetaminophen 5-325 MG per tablet  Commonly known as:  NORCO/VICODIN     ibuprofen 600 MG tablet  Commonly known as:  ADVIL,MOTRIN     oxyCODONE-acetaminophen 5-325 MG per tablet  Commonly known as:  PERCOCET/ROXICET      TAKE these medications        acetaminophen 325 MG tablet  Commonly known as:  TYLENOL  Take 2 tablets (650 mg total) by mouth every 6 (six) hours as needed for mild pain or moderate pain.     albuterol 108 (90 BASE) MCG/ACT inhaler  Commonly known as:  PROVENTIL HFA;VENTOLIN HFA  Inhale 2 puffs into the lungs every 4 (four) hours as needed for wheezing or shortness of breath.     amLODipine 5 MG tablet  Commonly known as:  NORVASC  Take 1 tablet (5 mg total) by mouth daily.     aspirin 325 MG tablet  Take 1  tablet (325 mg total) by mouth daily.     lisinopril 20 MG tablet  Commonly known as:  PRINIVIL,ZESTRIL  Take 1 tablet (20 mg total) by mouth daily.     lurasidone 40 MG Tabs tablet  Commonly known as:  LATUDA  Take 1 tablet (40 mg total) by mouth daily with breakfast.     predniSONE 20 MG tablet  Commonly known as:  DELTASONE  Take 1 tablet (20 mg total) by mouth daily with breakfast.       Allergies  Allergen Reactions  . Bee Venom Anaphylaxis  . Shellfish Allergy Anaphylaxis and Swelling       Follow-up Information    Follow up with [facility]     On 06/14/2015.   Why:  [facility] appointment on 06/14/15 at 12:00 pm  with Dr. Jarold Song.   Contact information:   [address]  [place] [address] [phone]       The results of significant diagnostics from this hospitalization (including imaging, microbiology, ancillary and laboratory) are listed below for reference.    Significant Diagnostic Studies: Dg Chest 2 View  06/08/2015   CLINICAL DATA:  Chest pressure and shortness breath.  EXAM: CHEST - 2 VIEW  COMPARISON:  Two-view chest x-ray 05/06/2015  FINDINGS: The heart is mildly enlarged. Ill-defined bibasilar lower lobe airspace opacities are present. There is no edema or effusion to suggest failure. The visualized soft tissues and bony thorax are unremarkable.  IMPRESSION: 1. Mild cardiomegaly without failure. 2. Bibasilar airspace disease likely reflects atelectasis.   Electronically Signed   By: San Morelle M.D.   On: 06/08/2015 14:33   Ct Angio Chest Pe W/cm &/or Wo Cm  06/09/2015   CLINICAL DATA:  Chest pressure and shortness of breath.  EXAM: CT ANGIOGRAPHY CHEST WITH CONTRAST  TECHNIQUE: Multidetector CT imaging of the chest was performed using the standard protocol during bolus administration of intravenous contrast. Multiplanar CT image reconstructions and MIPs were obtained to evaluate the vascular anatomy.  CONTRAST:  178mL OMNIPAQUE IOHEXOL 350 MG/ML SOLN  COMPARISON:  04/24/2015 from [place]  FINDINGS: THORACIC INLET/BODY WALL:  There is a round mass within the upper left breast measuring 12 mm.  MEDIASTINUM:  Mild cardiomegaly. No pericardial effusion. Suboptimal evaluation the pulmonary arteries due to patient size, bolus dispersion, intermittent motion. No suspected pulmonary embolism (anterior segment right upper lobe pulmonary artery appearance on image 92 best ascribed to motion). No aortic dissection or aneurysm. No adenopathy. Small sliding hiatal hernia.  LUNG WINDOWS:  Centrilobular and paraseptal emphysema. Ground-glass density in the setting of expiratory  phase.  UPPER ABDOMEN:  No acute findings.  OSSEOUS:  No acute fracture.  No suspicious lytic or blastic lesions.  Review of the MIP images confirms the above findings.  IMPRESSION: 1. Motion degraded CTA with no evidence of pulmonary embolism or other acute disease. 2. 12 mm left breast mass.  Recommend diagnostic mammography. 3. Emphysema.   Electronically Signed   By: Monte Fantasia M.D.   On: 06/09/2015 00:50   Nm Myocar Multi W/spect W/wall Motion / Ef  06/10/2015    There was no ST segment deviation noted during stress.     Microbiology: No results found for this or any previous visit (from the past 240 hour(s)).   Labs: Basic Metabolic Panel:  Recent Labs Lab 06/08/15 1346 06/09/15 0447  NA 140 140  K 4.1 4.0  CL 113* 108  CO2 21* 24  GLUCOSE 103* 150*  BUN 8 8  CREATININE 1.03* 1.10*  CALCIUM 8.6* 9.1   Liver Function Tests: No results for input(s): AST, ALT, ALKPHOS, BILITOT, PROT, ALBUMIN in the last 168 hours. No results for input(s): LIPASE, AMYLASE in the last 168 hours. No results for input(s): AMMONIA in the last 168 hours. CBC:  Recent Labs Lab 06/08/15 1346 06/09/15 0447  WBC 7.8 8.4  HGB 14.0 14.2  HCT 43.2 42.3  MCV 87.1 86.9  PLT 134* 197   Cardiac Enzymes:  Recent Labs Lab 06/09/15 0159 06/09/15 0447 06/09/15 1245  TROPONINI <0.03 <0.03 <0.03   BNP: BNP (last 3 results)  Recent Labs  05/06/15 0900 06/08/15 1346  BNP 29.5 81.5    ProBNP (last 3 results) No results for input(s): PROBNP in the last 8760 hours.  CBG:  Recent Labs Lab 06/09/15 2133  GLUCAP 186*       Signed:  Rowe Clack N  [facility] 06/10/2015, 12:38 PM

## 2015-06-10 NOTE — Care Management Note (Signed)
Case Management Note Marvetta Gibbons RN, BSN Unit 2W-Case Manager [phone]  Patient Details  Name: Taylor Vazquez MRN: [id number] Date of Birth: [birth year]  Subjective/Objective:       Pt admitted with SOB             Action/Plan:  PTA pt has been homeless- plans to go live with niece who lives in [place]- address- [address] [place] phone # pt can be reached at is [phone]  Expected Discharge Date:                  Expected Discharge Plan:  Home/Self Care  In-House Referral:  Clinical Social Work  Discharge planning Services  CM Consult, [facility], Follow-up appt scheduled, Medication Assistance  Post Acute Care Choice:    Choice offered to:     DME Arranged:    DME Agency:     HH Arranged:    [place] Agency:     Status of Service:  Completed, signed off  Medicare Important Message Given:    Date Medicare IM Given:    Medicare IM give by:    Date Additional Medicare IM Given:    Additional Medicare Important Message give by:     If discussed at [place] of Stay Meetings, dates discussed:    Additional Comments:  06/10/15- medication list for discharge reviewed with pt- per pt she has some bp meds still at home and also has inhaler in purse, pt will take prescriptions over to [facility] to fill- meds are low cost meds and pt should be able to afford- will not plan to assist with meds- CSW to bring bus pass to pt for transportation needs.   06/09/15- spoke with pt at bedside for d/c needs including PCP and potential medication needs- per conversation pt states that she is planning on staying in [place]- reports that she use to be a pt at [facility]- discussed different clinic options including [facility], [facility], [facility], and [facility]. Pt is interested in the [facility] but they are not taking new pt currently- have placed call to Carmela Hurt- with [facility] - and pt will be able to go there for f/u- appointment has been made for 06/14/15 at 12N.  Also talked to pt about potential medication needs- pt will be able to use the [facility] pharmacy- in looking at med list- meds are low cost and pt should be able to get them at the [facility] - if there are any high cost meds added at discharge pt would be eligible for [facility] if needed. Pt has been explained this and verbalizes understanding.   Dawayne Patricia, RN 06/10/2015, 1:58 PM

## 2015-06-13 ENCOUNTER — Telehealth: Payer: Self-pay

## 2015-06-13 NOTE — Telephone Encounter (Signed)
Attempted to contact the patient to check on her status. Call placed to # [phone] (M) and voice mail message left requesting a call back to # [phone] or [phone].

## 2015-06-13 NOTE — Telephone Encounter (Signed)
Transitional Care Clinic Post-discharge Follow-Up Phone Call:  Date of Discharge:06/10/2015 Principal Discharge Diagnosis(es): COPD exacerbation, chest pain Post-discharge Communication: Spoke to the patient Call Completed: Yes                   With Whom: Patient Interpreter Needed: No                    Please check all that apply:  X Patient is knowledgeable of his/her condition(s) and/or treatment. X Patient is caring for self at home.  X Patient is receiving assist at home from family and/or caregiver.  She is currently staying w/ her niece and her niece's 3 children.  Her niece works during the day and the patient stated that she has been watching the children. ? Patient is receiving home health services. If so, name of agency.     Medication Reconciliation:  ? Medication list reviewed with patient. X Patient obtained all discharge medications. She said that she has not had any of her prescriptions filled and does not have any of the medications prescribed at discharge. She said that she used her niece's breathing machine since she has been home. She noted that she has experienced " shortness of air' since she had her stroke a year ago.  This CM instructed her that she should not be taking anyone else's medications, including the breathing treatments. Instructed her to bring any medication bottles that she has w/ her to her appointment tomorrow and she agreed.  This CM also instructed her to discuss her need for a nebulizer w/ Dr Jarold Song tomorrow and she said that she would.  Instructed her re.the importance of getting all of her prescriptions filled and taking her medications as ordered and again she verbalized understanding.    Activities of Daily Living:  X  Independent - she said that she does not have any DME but she thinks that she could benefit from using a cane or walker  She said she will talk to Dr Jarold Song about it tomorrow.  ? Needs assist (describe; ? home DME used) ? [facility] (describe, ? home DME used)   Community resources in place for patient:  X None  - She did state that she is aware of the local resources for obtaining food, including the [employer].  She said that she has no problem accessing food at this time.  ? Home Health/Home DME ? Assisted Living ? Support Group          Patient Education: Reviewed the services provided at the [facility] including pharmacy assistance, financial counseling and social work.  She said that the hospital social worker gave her an application for medicaid and she needs to complete it. She said that she has applied in the past and has been denied but she wants to try again.  She also asked about an referral for a mammogram because she has a mass in her left breast and she said that she will discuss w/ Dr Jarold Song.   She said that her " whole body still aches."  She also noted that she has experienced some heaviness in her chest since she has been home and this CM instructed her about the s/s of a heart attack and the importance of seeing immediate medical attention /calliing 911 and she said that she knows that and will go to the ED if she experiences any chest pain. She noted that she does not need to go to the  ED at this time.   Confirmed her appointment for tomorrow - 06/14/15 @ 1200.  She said that her nephew would transport her to the clinic. She was very appreciative of CM phone call.         :

## 2015-06-14 ENCOUNTER — Encounter: Payer: Self-pay | Admitting: Family Medicine

## 2015-06-14 ENCOUNTER — Ambulatory Visit: Payer: Self-pay | Attending: Family Medicine | Admitting: Family Medicine

## 2015-06-14 ENCOUNTER — Encounter (HOSPITAL_BASED_OUTPATIENT_CLINIC_OR_DEPARTMENT_OTHER): Payer: Self-pay | Admitting: Clinical

## 2015-06-14 VITALS — BP 141/94 | HR 95 | Temp 97.8°F | Resp 16 | Ht 63.0 in | Wt 235.6 lb

## 2015-06-14 DIAGNOSIS — Z720 Tobacco use: Secondary | ICD-10-CM | POA: Insufficient documentation

## 2015-06-14 DIAGNOSIS — R531 Weakness: Secondary | ICD-10-CM

## 2015-06-14 DIAGNOSIS — I10 Essential (primary) hypertension: Secondary | ICD-10-CM

## 2015-06-14 DIAGNOSIS — N63 Unspecified lump in breast: Secondary | ICD-10-CM | POA: Insufficient documentation

## 2015-06-14 DIAGNOSIS — J449 Chronic obstructive pulmonary disease, unspecified: Secondary | ICD-10-CM | POA: Insufficient documentation

## 2015-06-14 DIAGNOSIS — F17200 Nicotine dependence, unspecified, uncomplicated: Secondary | ICD-10-CM

## 2015-06-14 DIAGNOSIS — D242 Benign neoplasm of left breast: Secondary | ICD-10-CM | POA: Insufficient documentation

## 2015-06-14 DIAGNOSIS — N6320 Unspecified lump in the left breast, unspecified quadrant: Secondary | ICD-10-CM

## 2015-06-14 DIAGNOSIS — M6289 Other specified disorders of muscle: Secondary | ICD-10-CM | POA: Insufficient documentation

## 2015-06-14 DIAGNOSIS — J441 Chronic obstructive pulmonary disease with (acute) exacerbation: Secondary | ICD-10-CM

## 2015-06-14 DIAGNOSIS — I639 Cerebral infarction, unspecified: Secondary | ICD-10-CM

## 2015-06-14 DIAGNOSIS — Z659 Problem related to unspecified psychosocial circumstances: Secondary | ICD-10-CM

## 2015-06-14 MED ORDER — ALBUTEROL SULFATE (2.5 MG/3ML) 0.083% IN NEBU: 2.5000 mg | INHALATION_SOLUTION | Freq: Four times a day (QID) | RESPIRATORY_TRACT | Status: DC | PRN

## 2015-06-14 MED ORDER — IPRATROPIUM-ALBUTEROL 0.5-2.5 (3) MG/3ML IN SOLN
3.0000 mL | Freq: Once | RESPIRATORY_TRACT | Status: AC
  Administered 2015-06-14: 3 mL via RESPIRATORY_TRACT

## 2015-06-14 MED ORDER — AZITHROMYCIN 250 MG PO TABS: ORAL_TABLET | ORAL | Status: DC

## 2015-06-14 MED ORDER — CEFTRIAXONE SODIUM 500 MG IJ SOLR
500.0000 mg | Freq: Once | INTRAMUSCULAR | Status: AC
Start: 2015-06-14 — End: 2015-06-14
  Administered 2015-06-14: 500 mg via INTRAMUSCULAR

## 2015-06-14 MED ORDER — METHYLPREDNISOLONE SODIUM SUCC 125 MG IJ SOLR
125.0000 mg | Freq: Once | INTRAMUSCULAR | Status: AC
  Administered 2015-06-14: 125 mg via INTRAMUSCULAR

## 2015-06-14 NOTE — Progress Notes (Signed)
Patient here for follow up from the hospital Patient was admitted for COPD exacerbation and chest pain Patient also states she was having some SOB Patient states she is still currently smoking Patient is requesting assistance from the social worker as well Patient does suffer from depression and currently taking latuda

## 2015-06-14 NOTE — Patient Instructions (Signed)

## 2015-06-14 NOTE — Progress Notes (Signed)
TRANSITIONAL CARE CLINIC   Admit Date: 06/08/15 Discharge Date: 06/10/15  Date of Telephone encounter: 06/13/15  PCP: None  Taylor Vazquez, is a 49 y.o. female  IPJ:[id number]  BHA:[id number]  DOB - 02-11-1966  CC:  Chief Complaint  Patient presents with  . Hospitalization Follow-up       HPI: Taylor Vazquez is a 49 y.o. female with a history of Tobacco, HTN, COPD, 6 ED visits in the last 6 mos who presented to the ED at [facility] with Chest pains, Shortness of breath. On presentation she was saturating at 95%, she was ruled out for ACS with negative troponins, EKG revealed NSR, ?septal infarct. Received bronchodilators, steroids with subsequent improvement in symptoms and she was transitioned to oral steroids. She underwent a 2-D echo which revealed left ventricular ejection fraction of 55-60%, stress test which was negative for ischemia and cardiology recommended outpatient follow-up. She had a CT angiogram of her chest which was negative for PE but revealed 12 mm left breast mass for which she was advised to follow-up as an outpatient.   Interval history: She is yet to pick up her medications from the pharmacy and as such is still coughing and wheezing and "does not feel good". Denies fever. States she should not have been discharged from the hospital so soon. Continues to smoke.  Allergies  Allergen Reactions  . Bee Venom Anaphylaxis  . Shellfish Allergy Anaphylaxis and Swelling   Past Medical History  Diagnosis Date  . Sickle cell trait   . Hypertension   . Asthma   . Depression   . Obesity   . Stroke   . CHF (congestive heart failure)    Current Outpatient Prescriptions on File Prior to Visit  Medication Sig Dispense Refill  . acetaminophen (TYLENOL) 325 MG tablet Take 2 tablets (650 mg total) by mouth every 6 (six) hours as needed for mild pain or moderate pain.    Marland Kitchen albuterol (PROVENTIL HFA;VENTOLIN HFA) 108 (90 BASE) MCG/ACT inhaler Inhale 2 puffs into  the lungs every 4 (four) hours as needed for wheezing or shortness of breath. 1 Inhaler 1  . amLODipine (NORVASC) 5 MG tablet Take 1 tablet (5 mg total) by mouth daily. 30 tablet 1  . aspirin 325 MG tablet Take 1 tablet (325 mg total) by mouth daily.    Marland Kitchen lisinopril (PRINIVIL,ZESTRIL) 20 MG tablet Take 1 tablet (20 mg total) by mouth daily. 30 tablet 0  . lurasidone (LATUDA) 40 MG TABS tablet Take 1 tablet (40 mg total) by mouth daily with breakfast. 30 tablet 0  . predniSONE (DELTASONE) 20 MG tablet Take 1 tablet (20 mg total) by mouth daily with breakfast. 4 tablet 1   No current facility-administered medications on file prior to visit.   Family History  Problem Relation Age of Onset  . Other Neg Hx   . Hypertension Mother   . Hypertension Father   . Hypertension Other   . Sickle cell trait Other   . Diabetes Other    Social History   Social History  . Marital Status: Single    Spouse Name: N/A  . Number of Children: N/A  . Years of Education: N/A   Occupational History  . Not on file.   Social History Main Topics  . Smoking status: Current Every Day Smoker -- 1.00 packs/day    Types: Cigarettes  . Smokeless tobacco: Never Used  . Alcohol Use: 0.0 oz/week    0 Standard drinks or equivalent per week  Comment: occ  . Drug Use: No     Comment: denies  . Sexual Activity: Yes    Birth Control/ Protection: None   Other Topics Concern  . Not on file   Social History Narrative    Review of Systems: Constitutional: Negative for fever, chills, diaphoresis, activity change, appetite change and fatigue. HENT: Negative for ear pain, nosebleeds, congestion, facial swelling, rhinorrhea, neck pain, neck stiffness and ear discharge.  Eyes: Negative for pain, discharge, redness, itching and visual disturbance. Respiratory: +cough, no choking, chest tightness, shortness of breath, + wheezing   Cardiovascular: Negative for chest pain, palpitations and leg  swelling. Gastrointestinal: Negative for abdominal distention. Genitourinary: Negative for dysuria, urgency, frequency, hematuria, flank pain, decreased urine volume, difficulty urinating and dyspareunia.  Musculoskeletal: Negative for back pain, joint swelling, arthralgia and gait problem. Neurological: Negative for dizziness, tremors, seizures, syncope, facial asymmetry, speech difficulty, weakness, light-headedness, numbness and headaches.  Hematological: Negative for adenopathy. Does not bruise/bleed easily. Skin: Negative for rash, ulcer. Psychiatric/Behavioral: Negative for hallucinations, behavioral problems, confusion, dysphoric mood, decreased concentration and agitation.    Objective:   Filed Vitals:   06/14/15 1130  BP: 141/94  Pulse: 95  Temp: 97.8 F (36.6 C)  Resp: 16    Physical Exam: Constitutional: Patient appears well-developed and well-nourished. No distress. HENT: Normocephalic, atraumatic, External right and left ear normal. Oropharynx is clear and moist.  Eyes: Conjunctivae and EOM are normal. PERRLA, no scleral icterus. Neck: Normal ROM, No JVD. No tracheal deviation. No thyromegaly Breast: no palpable lumps, tenderness on palpation of left breast . CVS: RRR, S1/S2 +, no murmurs, no gallops, no carotid bruit.  Pulmonary: wheezing bilaterally Abdominal: Soft. BS +, no distension, tenderness, rebound or guarding.  Musculoskeletal: Normal range of motion. No edema and no tenderness.  Lymphadenopathy: No lymphadenopathy noted, cervical, inguinal or axillary Neuro: Alert. Normal reflexes, motor strength is 3+/5 in right upper and right lower extremities, 5/5 in left upper and left lower extremities. Skin: Skin is warm and dry. No rash noted. Not diaphoretic. No erythema. No pallor. Psychiatric: Normal mood and affect. Behavior, judgment, thought content normal.  Lab Results  Component Value Date   WBC 8.4 06/09/2015   HGB 14.2 06/09/2015   HCT 42.3 06/09/2015    MCV 86.9 06/09/2015   PLT 197 06/09/2015   Lab Results  Component Value Date   CREATININE 1.10* 06/09/2015   BUN 8 06/09/2015   NA 140 06/09/2015   K 4.0 06/09/2015   CL 108 06/09/2015   CO2 24 06/09/2015    Lab Results  Component Value Date   HGBA1C 5.9* 05/17/2014   Lipid Panel     Component Value Date/Time   CHOL 154 05/17/2014 0330   TRIG 135 05/17/2014 0330   HDL 32* 05/17/2014 0330   CHOLHDL 4.8 05/17/2014 0330   VLDL 27 05/17/2014 0330   LDLCALC 95 05/17/2014 0330       Assessment and plan:  49 year old female with a history of CVA with R sided weakness, HTN, recently hospitalized for COPD exacerbation with persisting symptoms and imaging revealed incidental finding of left breast mass.   COPD: Acute exacerbation unresolved. IM Solumedrol, Im Ceftriaxone and nebulizer treatments given. Lung auscultation improved after above. Placed on Azithromycin. Will arrange with social worker for nebulizer machine. Will reassess at next visit for improvement and place on advair  HTN: Controlled Continue antihypertensives  Mass of left Breast: Not palpable on exam Referred for diagnostic mammogram and to the breast  center.  CVA with left hemiparesis: High risk for falls She is trying to obtain disability  Tobacco use disorder: Smoking cessation support: smoking cessation hotline: 1-800-QUIT-NOW.  Smoking cessation classes are available through [facility]. Call [phone] or visit our website at [email or web address]  Spent 3 minutes counseling on smoking cessation and patient is not ready to quit.   The patient was given clear instructions to go to ER or return to medical center if symptoms don't improve, worsen or new problems develop. The patient verbalized understanding. The patient was told to call to get lab results if they haven't heard anything in the next week.     Arnoldo Morale, [facility] [phone] 06/14/2015, 12:04 PM

## 2015-06-14 NOTE — Progress Notes (Signed)
ASSESSMENT: Pt currently experiencing psychosocial problems and would benefit from supportive counseling regarding coping with psychosocial circumstances.  Stage of Change: contemplative  PLAN: 1. F/U with behavioral health consultant in 3 days  2. Psychiatric Medications: Latuda. 3. Behavioral recommendation(s):   -Come back to [facility] for help with Medicaid and nebulizer paperwork  SUBJECTIVE: Pt. referred by Dr Jarold Song for psychosocial issues:  Pt. reports the following symptoms/concerns: Pt states she is not able to work because of her strokes, and she needs to reapply for Medicaid paperwork, but needs help filling it out. Needs bus passes to get home and food stamps have been cut off in July. Duration of problem: recent Severity: moderate  OBJECTIVE: Orientation & Cognition: Oriented x3. Thought processes normal and appropriate to situation. Mood: appropriate. Affect: appropriate Appearance: appropriate Risk of harm to self or others: no risk of harm to self or others Substance use: none Assessments administered: PHQ9: 10/ GAD7: 16  Diagnosis: Problems related to psychosocial circumstances CPT Code: Z65.9 -------------------------------------------- Other(s) present in the room: 49yo twin neices  Time spent with patient in exam room: 20 minutes

## 2015-06-16 ENCOUNTER — Encounter: Payer: Self-pay | Admitting: Family Medicine

## 2015-06-17 ENCOUNTER — Telehealth: Payer: Self-pay

## 2015-06-17 NOTE — Telephone Encounter (Signed)
Call placed to patient to determine if she picked up her medications after her appointment on 06/14/15. She indicated she now has all of her medications but has not yet picked up her nebulizer.  Stressed importance of picking up nebulizer today so albuterol can be used as needed for wheezing or shortness of breath. Patient indicated she would try to pick up nebulizer today. Again, stressed importance of picking up nebulizer.  Informed patient of clinic hours and informed her the [facility], SW would be informed that nebulizer to be picked up today. Patient verbalized understanding. Vesta Mixer, SW updated. Patient also remind of [facility] follow-up appointment on 06/22/15 at 1400 with Dr. Jarold Song. Patient verbalized understanding.

## 2015-06-21 ENCOUNTER — Telehealth: Payer: Self-pay

## 2015-06-21 NOTE — Telephone Encounter (Signed)
Call placed to the patient to check on her status and to confirm her appointment for tomorrow.  She said that she is feeling " all right" and she has all of her medications but she still needs to pick up her nebulizer. She said that she has been taking her medications as prescribed.  Instructed her about the importance of getting the nebulizer and she said that she understood.  Confirmed her appointment for tomorrow , 06/22/15 @ 1400. She said that she thinks she has transportation. CM instructed her to call the [facility] by 1100 if she does not have transportation and the [facility] can arrange transportation for her.  She verbalized understanding and said that she would call in the morning if she did not have a ride. Rolanda Lundborg, [facility] scheduler notified that the patient may be calling in the morning and request transportation.   No other problems/ questions reported.

## 2015-06-22 ENCOUNTER — Ambulatory Visit: Payer: Self-pay | Admitting: Family Medicine

## 2015-06-22 ENCOUNTER — Telehealth: Payer: Self-pay | Admitting: *Deleted

## 2015-06-22 NOTE — Telephone Encounter (Signed)
  Left HIPAA compliant message for patient to return Taylor Vazquez's call at [phone].  Patient missed her appointment today and was calling to check on her and to let her know we have a nebulizer machine at our office for her to pick up.

## 2015-06-28 ENCOUNTER — Telehealth: Payer: Self-pay

## 2015-06-28 NOTE — Telephone Encounter (Signed)
Attempted to contact  the patient to check on her status and to inquire about her missed appointment on 06/22/15 as well as to remind her to pick up her nebulizer at [facility].  Call placed to # [phone] (M) and a voice mail message was left requesting a call back to [phone] or [phone].

## 2015-06-30 ENCOUNTER — Telehealth: Payer: Self-pay

## 2015-06-30 NOTE — Telephone Encounter (Signed)
Call placed to patient to check on status, to discuss need to reschedule appointment with [facility], and need to pick up nebulizer at [facility]. Call placed to patient at [phone]. Unable to reach patient; voicemail left requesting return call. Also placed call to patient's emergency contact, Aaryana Betke (daughter) who indicated best number to reach patient is cell [phone]). Awaiting return call from patient.

## 2015-07-05 ENCOUNTER — Telehealth: Payer: Self-pay

## 2015-07-05 NOTE — Telephone Encounter (Signed)
Attempted to contact the patient to check on her status, to schedule a follow up appt and to discuss picking up the nebulizer.  Call placed to # [phone] and a voice mail message was left requesting a call back to # [phone] or [phone].

## 2015-07-06 ENCOUNTER — Telehealth: Payer: Self-pay

## 2015-07-06 NOTE — Telephone Encounter (Signed)
This Case Manager placed call to patient to check on status, to remind her of appointment on 07/08/15 at 1145 with Dr. Jarold Song, and to remind her to pick up nebulizer. Unable to reach; left HIPPA compliant voicemail requesting return call.

## 2015-07-08 ENCOUNTER — Ambulatory Visit: Payer: Self-pay | Admitting: Family Medicine

## 2015-07-11 ENCOUNTER — Telehealth: Payer: Self-pay

## 2015-07-11 ENCOUNTER — Telehealth: Payer: Self-pay | Admitting: *Deleted

## 2015-07-11 NOTE — Telephone Encounter (Signed)
Attempted to contact the patient to check on her status and to discuss scheduling a follow up appointment.  Call placed to # [phone] (M) and a voice mail message was left requesting a call back to # [phone] or [phone].

## 2015-07-11 NOTE — Telephone Encounter (Signed)
Received referral from Dr. Fransico Him office requesting for pt to be seen by a Med Onc, but the pt does not need to come here.   I emailed him back to let him know that the due to pt not having a positive diagnosis that she will need to go to either [facility] or to [facility].  I cancelled the referral to [place].

## 2015-07-14 ENCOUNTER — Telehealth: Payer: Self-pay

## 2015-07-14 NOTE — Telephone Encounter (Signed)
This Case Manager received call from patient requesting [facility] follow-up appointment with Dr. Jarold Song. Patient indicates she plans to pick up nebulizer at her appointment because she will not have transportation before then.  She indicates she has had a lot going on in her personal life and has been unable to make it to previous appointments, and transportation will be needed to her appointment. Appointment scheduled for 07/18/15 at 1445 with Dr. Jarold Song. Rolanda Lundborg, Clinic scheduler, notified of patient's need for transportation and informed Lorna Few of patient's current address. Demographics updated. She indicated she can be reached from now on at [phone] or at [phone] (emergency contact-Vonnie Mae Faison (sister)).  Inquired about patient's status. She indicated she was "worried about her breast mass" and planned to discuss this again with Dr. Jarold Song at her appointment on 07/18/15. Per chart review, Dr. Jarold Song referred patient for diagnostic mammogram and to the [facility]. Mammogram has not yet been done. Inquired if patient experiencing shortness of breath. Patient indicates she continues to have shortness of breath but not severe.  Informed patient to go immediately to ED if she remains severely short of breath after using inhaler, if she is unable to speak due to shortness of breath, or if she has incessant coughing. Patient verbalized understanding and indicates she is aware of when she needs to go to ED.  No additional needs identified at this time.

## 2015-07-18 ENCOUNTER — Inpatient Hospital Stay: Payer: Self-pay | Admitting: Family Medicine

## 2015-07-18 ENCOUNTER — Telehealth: Payer: Self-pay | Admitting: General Practice

## 2015-07-18 NOTE — Telephone Encounter (Signed)
Attempted to reach patient at [phone] but phone went straight to voicemail. I called patient at [phone] and sister answered, she stated that she did not know how to get in touch with the patient but will call around and try to have patient call back. Transportation was arranged, she needs to be ready for pick up at 2:15 for her appointment.

## 2015-07-18 NOTE — Telephone Encounter (Signed)
Called transportation and asked if the patient was picked up, transportation stated that they tried to get in touch with the patient but she did not answer and when arriving at the patients location they were told that the patient did not live there and to not take her to that address.  Transportation then left and has not been able to pick up the patient.

## 2015-08-10 ENCOUNTER — Emergency Department (HOSPITAL_COMMUNITY): Payer: Self-pay

## 2015-08-10 ENCOUNTER — Emergency Department (HOSPITAL_COMMUNITY)
Admission: EM | Admit: 2015-08-10 | Discharge: 2015-08-10 | Disposition: A | Discharge status: Home / Self Care | Payer: Self-pay | Attending: Emergency Medicine | Admitting: Emergency Medicine

## 2015-08-10 DIAGNOSIS — Z720 Tobacco use: Secondary | ICD-10-CM | POA: Insufficient documentation

## 2015-08-10 DIAGNOSIS — R531 Weakness: Secondary | ICD-10-CM | POA: Insufficient documentation

## 2015-08-10 DIAGNOSIS — N63 Unspecified lump in breast: Secondary | ICD-10-CM | POA: Insufficient documentation

## 2015-08-10 DIAGNOSIS — M549 Dorsalgia, unspecified: Secondary | ICD-10-CM | POA: Insufficient documentation

## 2015-08-10 DIAGNOSIS — I10 Essential (primary) hypertension: Secondary | ICD-10-CM | POA: Insufficient documentation

## 2015-08-10 DIAGNOSIS — I509 Heart failure, unspecified: Secondary | ICD-10-CM | POA: Insufficient documentation

## 2015-08-10 DIAGNOSIS — J441 Chronic obstructive pulmonary disease with (acute) exacerbation: Secondary | ICD-10-CM | POA: Insufficient documentation

## 2015-08-10 DIAGNOSIS — Z79899 Other long term (current) drug therapy: Secondary | ICD-10-CM | POA: Insufficient documentation

## 2015-08-10 DIAGNOSIS — Z7952 Long term (current) use of systemic steroids: Secondary | ICD-10-CM | POA: Insufficient documentation

## 2015-08-10 DIAGNOSIS — Z862 Personal history of diseases of the blood and blood-forming organs and certain disorders involving the immune mechanism: Secondary | ICD-10-CM | POA: Insufficient documentation

## 2015-08-10 DIAGNOSIS — N6320 Unspecified lump in the left breast, unspecified quadrant: Secondary | ICD-10-CM

## 2015-08-10 DIAGNOSIS — F329 Major depressive disorder, single episode, unspecified: Secondary | ICD-10-CM | POA: Insufficient documentation

## 2015-08-10 DIAGNOSIS — Z7982 Long term (current) use of aspirin: Secondary | ICD-10-CM | POA: Insufficient documentation

## 2015-08-10 DIAGNOSIS — E669 Obesity, unspecified: Secondary | ICD-10-CM | POA: Insufficient documentation

## 2015-08-10 DIAGNOSIS — F17200 Nicotine dependence, unspecified, uncomplicated: Secondary | ICD-10-CM

## 2015-08-10 DIAGNOSIS — G8191 Hemiplegia, unspecified affecting right dominant side: Secondary | ICD-10-CM | POA: Insufficient documentation

## 2015-08-10 DIAGNOSIS — Z792 Long term (current) use of antibiotics: Secondary | ICD-10-CM | POA: Insufficient documentation

## 2015-08-10 DIAGNOSIS — Z8673 Personal history of transient ischemic attack (TIA), and cerebral infarction without residual deficits: Secondary | ICD-10-CM | POA: Insufficient documentation

## 2015-08-10 DIAGNOSIS — R202 Paresthesia of skin: Secondary | ICD-10-CM | POA: Insufficient documentation

## 2015-08-10 LAB — COMPREHENSIVE METABOLIC PANEL
ALBUMIN: 3.3 g/dL — AB (ref 3.5–5.0)
ALK PHOS: 98 U/L (ref 38–126)
ALT: 27 U/L (ref 14–54)
ANION GAP: 10 (ref 5–15)
AST: 30 U/L (ref 15–41)
BUN: 11 mg/dL (ref 6–20)
CO2: 23 mmol/L (ref 22–32)
CREATININE: 1.07 mg/dL — AB (ref 0.44–1.00)
Calcium: 9.1 mg/dL (ref 8.9–10.3)
Chloride: 106 mmol/L (ref 101–111)
GFR calc Af Amer: >60 mL/min (ref >60)
GFR calc non Af Amer: >60 mL/min (ref >60)
Glucose, Bld: 132 mg/dL — ABNORMAL HIGH (ref 65–99)
Potassium: 3.5 mmol/L (ref 3.5–5.1)
SODIUM: 139 mmol/L (ref 135–145)
TOTAL PROTEIN: 6.4 g/dL — AB (ref 6.5–8.1)
Total Bilirubin: 0.5 mg/dL (ref 0.3–1.2)

## 2015-08-10 LAB — CBC WITH DIFFERENTIAL/PLATELET
BASOS ABS: 0 10*3/uL (ref 0.0–0.1)
BASOS PCT: 1 %
EOS PCT: 0 %
Eosinophils Absolute: 0 10*3/uL (ref 0.0–0.7)
HCT: 41.6 % (ref 36.0–46.0)
Hemoglobin: 13.7 g/dL (ref 12.0–15.0)
Lymphocytes Relative: 30 %
Lymphs Abs: 2.3 10*3/uL (ref 0.7–4.0)
MCH: 28.6 pg (ref 26.0–34.0)
MCHC: 32.9 g/dL (ref 30.0–36.0)
MCV: 86.8 fL (ref 78.0–100.0)
MONO ABS: 0.7 10*3/uL (ref 0.1–1.0)
Monocytes Relative: 9 %
NEUTROS ABS: 4.7 10*3/uL (ref 1.7–7.7)
Neutrophils Relative %: 60 %
PLATELETS: 247 10*3/uL (ref 150–400)
RBC: 4.79 MIL/uL (ref 3.87–5.11)
RDW: 14 % (ref 11.5–15.5)
WBC: 7.7 10*3/uL (ref 4.0–10.5)

## 2015-08-10 LAB — I-STAT CG4 LACTIC ACID, ED: LACTIC ACID, VENOUS: 1.95 mmol/L (ref 0.5–2.0)

## 2015-08-10 LAB — BRAIN NATRIURETIC PEPTIDE: B Natriuretic Peptide: 98.1 pg/mL (ref 0.0–100.0)

## 2015-08-10 LAB — I-STAT TROPONIN, ED: TROPONIN I, POC: 0.01 ng/mL (ref 0.00–0.08)

## 2015-08-10 MED ORDER — LEVOFLOXACIN 750 MG PO TABS: 750.0000 mg | ORAL_TABLET | Freq: Every day | ORAL | Status: DC

## 2015-08-10 MED ORDER — LEVOFLOXACIN 750 MG PO TABS
750.0000 mg | ORAL_TABLET | Freq: Once | ORAL | Status: AC
  Administered 2015-08-10: 750 mg via ORAL
  Filled 2015-08-10: qty 1

## 2015-08-10 MED ORDER — FUROSEMIDE 10 MG/ML IJ SOLN
20.0000 mg | Freq: Once | INTRAMUSCULAR | Status: AC
  Administered 2015-08-10: 20 mg via INTRAVENOUS
  Filled 2015-08-10: qty 2

## 2015-08-10 MED ORDER — METHYLPREDNISOLONE SODIUM SUCC 125 MG IJ SOLR
125.0000 mg | Freq: Once | INTRAMUSCULAR | Status: AC
  Administered 2015-08-10: 125 mg via INTRAVENOUS
  Filled 2015-08-10: qty 2

## 2015-08-10 MED ORDER — ALBUTEROL SULFATE (2.5 MG/3ML) 0.083% IN NEBU
5.0000 mg | INHALATION_SOLUTION | Freq: Once | RESPIRATORY_TRACT | Status: AC
  Administered 2015-08-10: 5 mg via RESPIRATORY_TRACT
  Filled 2015-08-10: qty 6

## 2015-08-10 MED ORDER — IPRATROPIUM-ALBUTEROL 0.5-2.5 (3) MG/3ML IN SOLN
3.0000 mL | Freq: Once | RESPIRATORY_TRACT | Status: AC
  Administered 2015-08-10: 3 mL via RESPIRATORY_TRACT
  Filled 2015-08-10: qty 3

## 2015-08-10 MED ORDER — PREDNISONE 50 MG PO TABS: ORAL_TABLET | ORAL | Status: DC

## 2015-08-10 MED ORDER — ALBUTEROL SULFATE HFA 108 (90 BASE) MCG/ACT IN AERS
2.0000 | INHALATION_SPRAY | Freq: Once | RESPIRATORY_TRACT | Status: AC
  Administered 2015-08-10: 2 via RESPIRATORY_TRACT
  Filled 2015-08-10: qty 6.7

## 2015-08-10 NOTE — ED Notes (Signed)
Patient states that she has been short of breath for 1 week.  C/O pain  In her low back and  Left breast.

## 2015-08-10 NOTE — Discharge Instructions (Signed)
Please follow with your primary care doctor in the next 2 days for a check-up. They must obtain records for further management.   Do not hesitate to return to the Emergency Department for any new, worsening or concerning symptoms.    Chronic Obstructive Pulmonary Disease Chronic obstructive pulmonary disease (COPD) is a common lung condition in which airflow from the lungs is limited. COPD is a general term that can be used to describe many different lung problems that limit airflow, including both chronic bronchitis and emphysema. If you have COPD, your lung function will probably never return to normal, but there are measures you can take to improve lung function and make yourself feel better. CAUSES   Smoking (common).  Exposure to secondhand smoke.  Genetic problems.  Chronic inflammatory lung diseases or recurrent infections. SYMPTOMS  Shortness of breath, especially with physical activity.  Deep, persistent (chronic) cough with a large amount of thick mucus.  Wheezing.  Rapid breaths (tachypnea).  Gray or bluish discoloration (cyanosis) of the skin, especially in your fingers, toes, or lips.  Fatigue.  Weight loss.  Frequent infections or episodes when breathing symptoms become much worse (exacerbations).  Chest tightness. DIAGNOSIS Your health care provider will take a medical history and perform a physical examination to diagnose COPD. Additional tests for COPD may include:  Lung (pulmonary) function tests.  Chest X-ray.  CT scan.  Blood tests. TREATMENT  Treatment for COPD may include:  Inhaler and nebulizer medicines. These help manage the symptoms of COPD and make your breathing more comfortable.  Supplemental oxygen. Supplemental oxygen is only helpful if you have a low oxygen level in your blood.  Exercise and physical activity. These are beneficial for nearly all people with COPD.  Lung surgery or transplant.  Nutrition therapy to gain weight, if  you are underweight.  Pulmonary rehabilitation. This may involve working with a team of health care providers and specialists, such as respiratory, occupational, and physical therapists. HOME CARE INSTRUCTIONS  Take all medicines (inhaled or pills) as directed by your health care provider.  Avoid over-the-counter medicines or cough syrups that dry up your airway (such as antihistamines) and slow down the elimination of secretions unless instructed otherwise by your health care provider.  If you are a smoker, the most important thing that you can do is stop smoking. Continuing to smoke will cause further lung damage and breathing trouble. Ask your health care provider for help with quitting smoking. He or she can direct you to community resources or hospitals that provide support.  Avoid exposure to irritants such as smoke, chemicals, and fumes that aggravate your breathing.  Use oxygen therapy and pulmonary rehabilitation if directed by your health care provider. If you require home oxygen therapy, ask your health care provider whether you should purchase a pulse oximeter to measure your oxygen level at home.  Avoid contact with individuals who have a contagious illness.  Avoid extreme temperature and humidity changes.  Eat healthy foods. Eating smaller, more frequent meals and resting before meals may help you maintain your strength.  Stay active, but balance activity with periods of rest. Exercise and physical activity will help you maintain your ability to do things you want to do.  Preventing infection and hospitalization is very important when you have COPD. Make sure to receive all the vaccines your health care provider recommends, especially the pneumococcal and influenza vaccines. Ask your health care provider whether you need a pneumonia vaccine.  Learn and use relaxation techniques to  manage stress.  Learn and use controlled breathing techniques as directed by your health care  provider. Controlled breathing techniques include:  Pursed lip breathing. Start by breathing in (inhaling) through your nose for 1 second. Then, purse your lips as if you were going to whistle and breathe out (exhale) through the pursed lips for 2 seconds.  Diaphragmatic breathing. Start by putting one hand on your abdomen just above your waist. Inhale slowly through your nose. The hand on your abdomen should move out. Then purse your lips and exhale slowly. You should be able to feel the hand on your abdomen moving in as you exhale.  Learn and use controlled coughing to clear mucus from your lungs. Controlled coughing is a series of short, progressive coughs. The steps of controlled coughing are: 1. Lean your head slightly forward. 2. Breathe in deeply using diaphragmatic breathing. 3. Try to hold your breath for 3 seconds. 4. Keep your mouth slightly open while coughing twice. 5. Spit any mucus out into a tissue. 6. Rest and repeat the steps once or twice as needed. SEEK MEDICAL CARE IF:  You are coughing up more mucus than usual.  There is a change in the color or thickness of your mucus.  Your breathing is more labored than usual.  Your breathing is faster than usual. SEEK IMMEDIATE MEDICAL CARE IF:  You have shortness of breath while you are resting.  You have shortness of breath that prevents you from:  Being able to talk.  Performing your usual physical activities.  You have chest pain lasting longer than 5 minutes.  Your skin color is more cyanotic than usual.  You measure low oxygen saturations for longer than 5 minutes with a pulse oximeter. MAKE SURE YOU:  Understand these instructions.  Will watch your condition.  Will get help right away if you are not doing well or get worse.   This information is not intended to replace advice given to you by your health care provider. Make sure you discuss any questions you have with your health care provider.   Document  Released: 07/18/2005 Document Revised: 10/29/2014 Document Reviewed: 06/04/2013 Elsevier Interactive Patient Education [employer].

## 2015-08-10 NOTE — Care Management (Signed)
ED CM consulted concerning patient having 3 ED visits and 1 hospitalization for similar complaints. Patient missed several appointments at the [facility], patient states that she has a nebulizer machine over at the [facility] that was ordered last month. Patient reports that she has been trying to contact the clinic to make an appointment.  ED CM unable to contact the office due to a phone issue. New appointment schedule 10/26 at the [facility], stressed the importance of attending the appointment. Instructed patient to contact clinic if she unable to keep appt at least 24-48 hours in advance. Patient verbalized understanding teach pack done.  No further ED CM needs identified

## 2015-08-10 NOTE — ED Provider Notes (Signed)
CSN: [id number]     Arrival date & time 08/10/15  1230 History   First MD Initiated Contact with Patient 08/10/15 1253     Chief Complaint  Patient presents with  . Shortness of Breath     (Consider location/radiation/quality/duration/timing/severity/associated sxs/prior Treatment) HPI  Blood pressure 207/109, pulse 116, temperature 98.9 F (37.2 C), temperature source Oral, resp. rate 22, SpO2 99 %.  Taylor Vazquez is a 49 y.o. female complaining of with PMH of CVA, asthma, diastolic heart failure, obesity, and HTN presents with SOB, back pain, and left breast tenderness. The patient states that she has had difficulty breathing for the past two months. Last night she was woken up with severe SOB. She reports PND and orthopnea, sleeping with 6 pillows at night. Reports associated cough that has been present for 2 weeks and reports some white-clear sputum production. Also reports significant swelling of the ankles and feet yesterday. Reports associated "chest pressure". The back pain has been chronic but over the past 2-3 days she has noticed increased pain in the lower back. It has affected her ability to walk. Reports associated right sided weakness in the leg and paresthesias in the right thigh and feet bilaterally. Patient has a chronic right-sided hemiparesis secondary to remote stroke but says it is worsening over the course of last week. She has dropped her grandchild, states that her daughter will not hold the grandkids anymore because she is worried that she will drop them. Denies bladder or bowel incontinence. The breast tenderness has been present for months now but over the last week has gotten significantly worse. She reports that she has felt a tender mass near the nipple. Reports clear discharge but denies erythema or swelling of the breast. Mammogram from last year was negative for malignancy per patient.    Past Medical History  Diagnosis Date  . Sickle cell trait   .  Hypertension   . Asthma   . Depression   . Obesity   . Stroke   . CHF (congestive heart failure)    Past Surgical History  Procedure Laterality Date  . No past surgeries     Family History  Problem Relation Age of Onset  . Other Neg Hx   . Hypertension Mother   . Hypertension Father   . Hypertension Other   . Sickle cell trait Other   . Diabetes Other    Social History  Substance Use Topics  . Smoking status: Current Every Day Smoker -- 1.00 packs/day    Types: Cigarettes  . Smokeless tobacco: Never Used  . Alcohol Use: 0.0 oz/week    0 Standard drinks or equivalent per week     Comment: occ   OB History    Gravida Para Term Preterm AB TAB SAB Ectopic Multiple Living   8 5  5 1 1    4      Review of Systems    Allergies  Bee venom and Shellfish allergy  Home Medications   Prior to Admission medications   Medication Sig Start Date End Date Taking? Authorizing Provider  acetaminophen (TYLENOL) 325 MG tablet Take 2 tablets (650 mg total) by mouth every 6 (six) hours as needed for mild pain or moderate pain. 06/10/15   Kinnie Feil, MD  albuterol (PROVENTIL HFA;VENTOLIN HFA) 108 (90 BASE) MCG/ACT inhaler Inhale 2 puffs into the lungs every 4 (four) hours as needed for wheezing or shortness of breath. 06/10/15   Kinnie Feil, MD  albuterol (PROVENTIL) (  2.5 MG/3ML) 0.083% nebulizer solution Take 3 mLs (2.5 mg total) by nebulization every 6 (six) hours as needed for wheezing or shortness of breath. 06/14/15   Arnoldo Morale, MD  amLODipine (NORVASC) 5 MG tablet Take 1 tablet (5 mg total) by mouth daily. 06/10/15   Kinnie Feil, MD  aspirin 325 MG tablet Take 1 tablet (325 mg total) by mouth daily. 06/10/15   Kinnie Feil, MD  azithromycin (ZITHROMAX) 250 MG tablet 2 tabs (500mg ) on day one then 250mg  daily on days two to five. 06/14/15   Arnoldo Morale, MD  lisinopril (PRINIVIL,ZESTRIL) 20 MG tablet Take 1 tablet (20 mg total) by mouth daily. 06/10/15   Kinnie Feil, MD  lurasidone (LATUDA) 40 MG TABS tablet Take 1 tablet (40 mg total) by mouth daily with breakfast. 06/10/15   Kinnie Feil, MD  predniSONE (DELTASONE) 20 MG tablet Take 1 tablet (20 mg total) by mouth daily with breakfast. 06/10/15   Kinnie Feil, MD   BP 207/109 mmHg  Pulse 116  Temp(Src) 98.9 F (37.2 C) (Oral)  Resp 22  SpO2 99% Physical Exam  Constitutional: She is oriented to person, place, and time. She appears well-developed and well-nourished. No distress.  Eating fast food  HENT:  Head: Normocephalic.  Mouth/Throat: Oropharynx is clear and moist.  Eyes: Conjunctivae are normal.  Neck: Normal range of motion. No JVD present. No tracheal deviation present.  Cardiovascular: Normal rate, regular rhythm and intact distal pulses.   Radial pulse equal bilaterally  Pulmonary/Chest: Effort normal. No stridor. No respiratory distress. She has wheezes. She has no rales. She exhibits no tenderness.  Hacking cough, good air movement in all fields. Patient has trace scattered expiratory wheezing worse on the left than the right.  Abdominal: Soft. She exhibits no distension and no mass. There is no tenderness. There is no rebound and no guarding.  Musculoskeletal: Normal range of motion. She exhibits no edema or tenderness.  No calf asymmetry, superficial collaterals, palpable cords, edema, Homans sign negative bilaterally.    Neurological: She is alert and oriented to person, place, and time.     Skin: Skin is warm. She is not diaphoretic.  Psychiatric: She has a normal mood and affect.  Nursing note and vitals reviewed.   ED Course  Procedures (including critical care time) Labs Review Labs Reviewed - No data to display  Imaging Review No results found. I have personally reviewed and evaluated these images and lab results as part of my medical decision-making.   EKG Interpretation None      MDM   Final diagnoses:  COPD exacerbation (HCC)  Breast mass,  left  Tobacco use disorder    Filed Vitals:   08/10/15 1249 08/10/15 1525 08/10/15 1526  BP: 207/109 161/80   Pulse: 116 83   Temp: 98.9 F (37.2 C)  98.4 F (36.9 C)  TempSrc: Oral    Resp: 22 18   SpO2: 99% 97%     Medications  furosemide (LASIX) injection 20 mg (20 mg Intravenous Given 08/10/15 1413)  methylPREDNISolone sodium succinate (SOLU-MEDROL) 125 mg/2 mL injection 125 mg (125 mg Intravenous Given 08/10/15 1413)  ipratropium-albuterol (DUONEB) 0.5-2.5 (3) MG/3ML nebulizer solution 3 mL (3 mLs Nebulization Given 08/10/15 1402)  levofloxacin (LEVAQUIN) tablet 750 mg (750 mg Oral Given 08/10/15 1530)  albuterol (PROVENTIL) (2.5 MG/3ML) 0.083% nebulizer solution 5 mg (5 mg Nebulization Given 08/10/15 1557)    Oreatha Fabry Stanislaw is 49 y.o. female presenting with cough  and worsening shortness of breath. Patient also reports worsening right-sided hemiparesis over the last week. Patient states that she has orthopnea, paroxysmal nocturnal dyspnea and she was swollen bilateral lower tremor to yesterday however, on my exam she does not appear to be volume overloaded. Patient will be given Lasix, treated for COPD exacerbation with Solu-Medrol and DuoNeb. Extensive counseling on smoking cessation lasting greater than 10 minutes. Head CT to evaluate her worsening hemiparesis.  Blood work, EKG, chest x-ray reassuring. Wheezing resolved after first nebulizer treatment. CT head negative. Patient states that she's had issues getting into see the wellness Center, they were supposed to supply her with a nebulizer. She's also had issues getting into the breast center to evaluate her left breast mass. Case management discussion with Mariann Laster who will help her navigate this.  Evaluation does not show pathology that would require ongoing emergent intervention or inpatient treatment. Pt is hemodynamically stable and mentating appropriately. Discussed findings and plan with patient/guardian, who agrees with  care plan. All questions answered. Return precautions discussed and outpatient follow up given.   New Prescriptions   LEVOFLOXACIN (LEVAQUIN) 750 MG TABLET    Take 1 tablet (750 mg total) by mouth daily. X 7 days   PREDNISONE (DELTASONE) 50 MG TABLET    Take 1 tablet daily with breakfast         Monico Blitz, PA-C 08/10/15 1925  Daleen Bo, MD 08/11/15 [phone]

## 2015-08-12 ENCOUNTER — Telehealth: Payer: Self-pay

## 2015-08-12 NOTE — Telephone Encounter (Signed)
This Case Manager placed call to patient to inform her that Hattie Perch, [place] and [facility] has authorized her to receive transportation to her upcoming appointment on 08/17/15 at 1145 with Dr. Jarold Song. However, per [facility], if patient does not utilize transportation and misses appointment, clinic will not provide any additional transportation services.  Unable to reach patient; spoke with her sister and left HIPPA compliant message requesting patient return call to this Case Manager.

## 2015-08-12 NOTE — Telephone Encounter (Signed)
This Case Manager received return call from patient.  Informed patient that Hattie Perch, [place] and [place] has authorized her to receive transportation to her upcoming appointment on 08/17/15 at 1145 with Dr. Jarold Song. However, per [facility], if patient does not utilize transportation and misses appointment, clinic will not provide any additional transportation services.Patient verbalized understanding and indicated she would be at her appointment. She indicated she is now staying with her daughter, Lanelle Bal, and address is [address], [place], [place].  Demographics has been updated. Britt, informed that patient needs transportation to her upcoming appointment.  Patient also inquired about nebulizer. Informed patient that per [facility], [facility] clinic does not currently have any refurbished nebulizers at this time. Clinic only has new nebulizers in stock which cost about $100. Patient indicated she is unable to afford cost of new nebulizer.  Will route this note to Dr. Jarold Song to determine if a refurbished nebulizer could be ordered through a DME company.

## 2015-08-16 ENCOUNTER — Telehealth: Payer: Self-pay

## 2015-08-16 ENCOUNTER — Telehealth: Payer: Self-pay | Admitting: General Practice

## 2015-08-16 NOTE — Telephone Encounter (Signed)
Attempted to contact the patient to check on her status and to confirm her appointment for tomorrow, 08/17/15 @ 1145. Call placed to # [phone] (M) and a voice mail message was left requesting a call back to # [phone] or [phone]. A call was also placed to # [phone] (H) and her daughter, Lanelle Bal, answered and stated that the patient has an appointment at the [facility] tomorrow  before this CM could comment. This CM requested that the patient return this call to # [phone] or [phone].

## 2015-08-16 NOTE — Telephone Encounter (Signed)
Called pt and spoke with sister. Sister stated that she would let patient know that transportation would pick her up tomorrow at [place].

## 2015-08-16 NOTE — Telephone Encounter (Signed)
Call received from the patient's sister, [name].  She said that she has her sister's [name])  phone [phone] (M) and will give [name] the message that this CM left on the phone requesting a call back.

## 2015-08-17 ENCOUNTER — Inpatient Hospital Stay: Payer: Self-pay | Admitting: Family Medicine

## 2015-08-17 ENCOUNTER — Telehealth: Payer: Self-pay

## 2015-08-17 NOTE — Telephone Encounter (Signed)
This Case Manager placed call to patient to remind her of appointment today at 1145 with Dr. Jarold Song. Transportation arranged and scheduled to pick patient up at 1100. Spoke with patient's sister; HIPPA compliant message left requesting patient return call to this Case Manager.

## 2015-09-08 ENCOUNTER — Ambulatory Visit: Payer: Self-pay | Attending: Family Medicine | Admitting: Family Medicine

## 2015-09-08 ENCOUNTER — Encounter: Payer: Self-pay | Admitting: Family Medicine

## 2015-09-08 ENCOUNTER — Telehealth: Payer: Self-pay | Admitting: Clinical

## 2015-09-08 ENCOUNTER — Other Ambulatory Visit: Payer: Self-pay | Admitting: Family Medicine

## 2015-09-08 VITALS — BP 138/95 | HR 87 | Temp 98.7°F | Resp 16 | Ht 63.0 in | Wt 232.0 lb

## 2015-09-08 DIAGNOSIS — J4520 Mild intermittent asthma, uncomplicated: Secondary | ICD-10-CM

## 2015-09-08 DIAGNOSIS — N63 Unspecified lump in breast: Secondary | ICD-10-CM | POA: Insufficient documentation

## 2015-09-08 DIAGNOSIS — Z7952 Long term (current) use of systemic steroids: Secondary | ICD-10-CM | POA: Insufficient documentation

## 2015-09-08 DIAGNOSIS — Z79899 Other long term (current) drug therapy: Secondary | ICD-10-CM | POA: Insufficient documentation

## 2015-09-08 DIAGNOSIS — Z9119 Patient's noncompliance with other medical treatment and regimen: Secondary | ICD-10-CM | POA: Insufficient documentation

## 2015-09-08 DIAGNOSIS — I10 Essential (primary) hypertension: Secondary | ICD-10-CM | POA: Insufficient documentation

## 2015-09-08 DIAGNOSIS — F17200 Nicotine dependence, unspecified, uncomplicated: Secondary | ICD-10-CM | POA: Insufficient documentation

## 2015-09-08 DIAGNOSIS — D573 Sickle-cell trait: Secondary | ICD-10-CM | POA: Insufficient documentation

## 2015-09-08 DIAGNOSIS — Z7982 Long term (current) use of aspirin: Secondary | ICD-10-CM | POA: Insufficient documentation

## 2015-09-08 DIAGNOSIS — J449 Chronic obstructive pulmonary disease, unspecified: Secondary | ICD-10-CM | POA: Insufficient documentation

## 2015-09-08 DIAGNOSIS — J441 Chronic obstructive pulmonary disease with (acute) exacerbation: Secondary | ICD-10-CM

## 2015-09-08 DIAGNOSIS — N6320 Unspecified lump in the left breast, unspecified quadrant: Secondary | ICD-10-CM

## 2015-09-08 DIAGNOSIS — J45909 Unspecified asthma, uncomplicated: Secondary | ICD-10-CM | POA: Insufficient documentation

## 2015-09-08 MED ORDER — VENTOLIN HFA 108 (90 BASE) MCG/ACT IN AERS: 2.0000 | INHALATION_SPRAY | Freq: Four times a day (QID) | RESPIRATORY_TRACT | Status: DC | PRN

## 2015-09-08 MED ORDER — LISINOPRIL 20 MG PO TABS: 20.0000 mg | ORAL_TABLET | Freq: Every day | ORAL | Status: DC

## 2015-09-08 NOTE — Progress Notes (Signed)
Subjective:    Patient ID: Taylor Vazquez, female    DOB: 1966-06-15, 49 y.o.   MRN: [id number]  HPI 49 year old female patient with a history of hypertension, asthma, COPD who presents today complaining of left breast mass which she has noticed for the last few months and is now getting tender more so with her periods. She was referred for diagnostic breast mammogram on 06/14/15 due to finding of a 12 mm left breast mass on CT angiogram of the chest and she never showed up for that appointment.  She is also requesting a nebulizer machine today. Review of her chart indicates there are multiple attempts by the clinic staff and reaching her to call her in for missed office visits which she never responded to and the patient attributes this to being homeless. On one instance a cab Was sent to go pick the patient up for an appointment but the patient was noted to be found in getting there.  Past Medical History  Diagnosis Date  . Sickle cell trait ([place])   . Hypertension   . Asthma   . Depression   . Obesity   . Stroke ([place])   . CHF (congestive heart failure) [facility])     Past Surgical History  Procedure Laterality Date  . No past surgeries      Social History   Social History  . Marital Status: Single    Spouse Name: N/A  . Number of Children: N/A  . Years of Education: N/A   Occupational History  . Not on file.   Social History Main Topics  . Smoking status: Current Every Day Smoker -- 1.00 packs/day    Types: Cigarettes  . Smokeless tobacco: Never Used  . Alcohol Use: 0.0 oz/week    0 Standard drinks or equivalent per week     Comment: occ  . Drug Use: No     Comment: denies  . Sexual Activity: Yes    Birth Control/ Protection: None   Other Topics Concern  . Not on file   Social History Narrative    Allergies  Allergen Reactions  . Bee Venom Anaphylaxis  . Shellfish Allergy Anaphylaxis and Swelling    Current Outpatient Prescriptions on File Prior to  Visit  Medication Sig Dispense Refill  . acetaminophen (TYLENOL) 325 MG tablet Take 2 tablets (650 mg total) by mouth every 6 (six) hours as needed for mild pain or moderate pain.    Marland Kitchen amLODipine (NORVASC) 5 MG tablet Take 1 tablet (5 mg total) by mouth daily. 30 tablet 1  . aspirin 325 MG tablet Take 1 tablet (325 mg total) by mouth daily.    Marland Kitchen levofloxacin (LEVAQUIN) 750 MG tablet Take 1 tablet (750 mg total) by mouth daily. X 7 days 7 tablet 0  . predniSONE (DELTASONE) 50 MG tablet Take 1 tablet daily with breakfast (Patient not taking: Reported on 09/08/2015) 5 tablet 0  . [DISCONTINUED] lurasidone (LATUDA) 40 MG TABS tablet Take 1 tablet (40 mg total) by mouth daily with breakfast. (Patient not taking: Reported on 08/10/2015) 30 tablet 0   No current facility-administered medications on file prior to visit.      Review of Systems  Constitutional: Negative for activity change and appetite change.  HENT: Negative for sinus pressure and sore throat.   Respiratory: Negative for chest tightness, shortness of breath and wheezing.   Gastrointestinal: Negative for abdominal pain, constipation and abdominal distention.  Genitourinary: Negative.   Musculoskeletal: Negative.   Psychiatric/Behavioral:  Negative for behavioral problems and dysphoric mood.       Objective: Filed Vitals:   09/08/15 1103 09/08/15 1117  BP: 138/95 138/95  Pulse: 87 87  Temp: 98.3 F (36.8 C) 98.7 F (37.1 C)  TempSrc: Oral   Resp: 16 16  Height: 5\' 3"  (1.6 m) 5\' 3"  (1.6 m)  Weight: 232 lb (105.235 kg) 232 lb (105.235 kg)  SpO2: 99% 99%      Physical Exam  Constitutional: She is oriented to person, place, and time. She appears well-developed and well-nourished.  Cardiovascular: Normal rate, normal heart sounds and intact distal pulses.   No murmur heard. Pulmonary/Chest: Effort normal and breath sounds normal. She has no wheezes. She has no rales. She exhibits no tenderness.  Abdominal: Soft. Bowel  sounds are normal. She exhibits no distension and no mass. There is no tenderness.  Genitourinary: There is breast swelling (palpable lump at 1 o'clock which is tender and mobile) and tenderness (left upper tenderness at 1 o'clock.).  Musculoskeletal: Normal range of motion.  Neurological: She is alert and oriented to person, place, and time.          Assessment & Plan:  Left breast lump: Previously referred for diagnostic mammogram but patient has been noncompliant with appointment and had also been noncompliant with follow-up appointments in the clinics despite multiple attempts by staff to get in touch with her. Referred again for diagnostic mammogram today; clinic staff call the [facility] and we informed that because the patient has no medical coverage she would have to go through [facility] and so the number was provided to her to call and make an appointment. I will see her back at her next office visit to follow-up on this.  Asthma and COPD: She is asymptomatic at this time. She is at high risk for exacerbations given poor compliance with medications and follow-up visits. Nebulizer machine had been placed on hold for the patient in the office for a while but she never came back to pick this up and so he was handed over to another patient in need. We'll work with the Education officer, museum to see if we can get her another one.

## 2015-09-08 NOTE — Progress Notes (Signed)
Pt c/o lump in left breast pain. Pt rated at 7/10 described as aching, throbbing pain, off and on.  Pt asking for a referral to breast center.  Pt requesting refill on meds.

## 2015-09-08 NOTE — Telephone Encounter (Signed)
Taylor Vazquez says she needs an appointment to see her PCP at [facility]. She apologizes that she missed several appointments, and was not at her listed address when transportation attempted to pick her up, that she was too embarrassed to tell [facility] that she was homeless, but that she has "straightened all that out, staying with my daughter now". Taylor Vazquez was told by the ED nurse to talk to the social worker at [facility] to obtain an appointment, and she needs a referral to the breast center because of a lump and pain in one breast.

## 2015-09-08 NOTE — Progress Notes (Signed)
Case conferenced with Dr. Jarold Song who indicated patient needed a nebulizer. Spoke with Hattie Perch, Practice Manager, who approved patient getting charity nebulizer. Nebulizer given to patient as well as additional nebulizer tubing. Informed patient that nebulizer parts must be cleaned thoroughly after each use. Also, discussed getting additional parts from medical supply, and patient verbalized understanding.  Offered to show patient how to use nebulizer multiple times; however, patient indicated she knew how to use a nebulizer and did not need a demonstration. Patient aware she will need to follow-up with Dr. Jarold Song in three weeks.

## 2015-09-14 ENCOUNTER — Other Ambulatory Visit (HOSPITAL_COMMUNITY): Payer: Self-pay | Admitting: *Deleted

## 2015-09-14 DIAGNOSIS — N6320 Unspecified lump in the left breast, unspecified quadrant: Secondary | ICD-10-CM

## 2015-09-21 ENCOUNTER — Telehealth: Payer: Self-pay | Admitting: Clinical

## 2015-09-21 NOTE — Telephone Encounter (Signed)
Taylor Vazquez wants to know if [facility] will provide transportation to her mammography appointment at [facility] on 09-29-15, and her [facility] appointment on 09-30-15. She is informed that there is no guarantee that we will provide transportation, but that someone from [facility] will contact her back with the answer. She is also encouraged to complete her orange card aplication, so that she will be eligible for orange card transportation for future medical appointments.

## 2015-09-23 ENCOUNTER — Telehealth: Payer: Self-pay | Admitting: Clinical

## 2015-09-23 NOTE — Telephone Encounter (Signed)
Informed Taylor Vazquez that [facility] will provide her transportation to and from her medical appointments on 09-29-15 and 09-30-15.

## 2015-09-29 ENCOUNTER — Ambulatory Visit
Admission: RE | Admit: 2015-09-29 | Discharge: 2015-09-29 | Disposition: A | Discharge status: Home / Self Care | Payer: No Typology Code available for payment source | Source: Ambulatory Visit | Attending: Family Medicine | Admitting: Family Medicine

## 2015-09-29 ENCOUNTER — Ambulatory Visit (HOSPITAL_COMMUNITY)
Admission: RE | Admit: 2015-09-29 | Discharge: 2015-09-29 | Disposition: A | Discharge status: Home / Self Care | Payer: Self-pay | Source: Ambulatory Visit | Attending: Obstetrics and Gynecology | Admitting: Obstetrics and Gynecology

## 2015-09-29 ENCOUNTER — Encounter (HOSPITAL_COMMUNITY): Payer: Self-pay

## 2015-09-29 VITALS — BP 132/84 | Temp 97.9°F | Ht 63.0 in | Wt 233.0 lb

## 2015-09-29 DIAGNOSIS — Z01419 Encounter for gynecological examination (general) (routine) without abnormal findings: Secondary | ICD-10-CM

## 2015-09-29 DIAGNOSIS — N6321 Unspecified lump in the left breast, upper outer quadrant: Secondary | ICD-10-CM

## 2015-09-29 DIAGNOSIS — N6320 Unspecified lump in the left breast, unspecified quadrant: Secondary | ICD-10-CM

## 2015-09-29 DIAGNOSIS — N644 Mastodynia: Secondary | ICD-10-CM

## 2015-09-29 NOTE — Patient Instructions (Signed)
Educational materials on self breast awareness given. Explained to [employer] that [facility] will cover Pap smears and HPV typing every 5 years unless has a history of abnormal Pap smears. Referred patient to the [facility] for diagnostic mammogram. Appointment scheduled for Thursday, September 29, 2015 at 1030. Patient aware of appointment and will be there. Let patient know will follow up with her within the next couple weeks with results of Pap smear by phone. Cerina R Reinoso verbalized understanding.  Catalena Stanhope, Arvil Chaco, RN 9:25 AM

## 2015-09-29 NOTE — Progress Notes (Signed)
Complaints of a painful left breast lump x 6 months. Patient states the pain is a constant throbbing pain rating it at a 8-9 out of 10. Patient states she had some spontaneous clear left nipple discharge twice. She states that the left nipple itches at times with redness that comes and goes.  Pap Smear:  Pap smear completed today. Patients last Pap smear was 11/04/08 at [facility] and normal. Per patient has no history of an abnormal Pap smear. Pap smear results above are in EPIC.  Physical exam: Breasts Breasts symmetrical. No skin abnormalities bilateral breasts. No nipple retraction bilateral breasts. No nipple discharge bilateral breasts on exam. No lymphadenopathy. No lumps palpated right breast. Palpated a lump within the left breast at 1 o'clock under the areola. Patient complained of pain when palpated left center of breast around area lump palpated. Referred patient to the [facility] for diagnostic mammogram. Appointment scheduled for Thursday, September 29, 2015 at 1030.           Pelvic/Bimanual   Ext Genitalia No lesions, no swelling and no discharge observed on external genitalia.         Vagina Vagina pink and normal texture. No lesions or discharge observed in vagina.          Cervix Cervix is present. Cervix pink and of normal texture. No discharge observed.     Uterus Uterus is present and palpable. Uterus in normal position and normal size.        Adnexae Bilateral ovaries present and unable to palpate. No tenderness on palpation.          Rectovaginal No rectal exam completed today since patient had no rectal complaints. No skin abnormalities observed on exam.

## 2015-09-30 ENCOUNTER — Encounter: Payer: Self-pay | Admitting: Family Medicine

## 2015-09-30 ENCOUNTER — Ambulatory Visit: Payer: Self-pay | Attending: Family Medicine | Admitting: Family Medicine

## 2015-09-30 VITALS — BP 114/75 | HR 82 | Temp 98.2°F | Resp 16 | Ht 63.0 in | Wt 233.0 lb

## 2015-09-30 DIAGNOSIS — I10 Essential (primary) hypertension: Secondary | ICD-10-CM | POA: Insufficient documentation

## 2015-09-30 DIAGNOSIS — K029 Dental caries, unspecified: Secondary | ICD-10-CM | POA: Insufficient documentation

## 2015-09-30 DIAGNOSIS — I5032 Chronic diastolic (congestive) heart failure: Secondary | ICD-10-CM | POA: Insufficient documentation

## 2015-09-30 DIAGNOSIS — J4520 Mild intermittent asthma, uncomplicated: Secondary | ICD-10-CM | POA: Insufficient documentation

## 2015-09-30 DIAGNOSIS — Z79899 Other long term (current) drug therapy: Secondary | ICD-10-CM | POA: Insufficient documentation

## 2015-09-30 DIAGNOSIS — D242 Benign neoplasm of left breast: Secondary | ICD-10-CM | POA: Insufficient documentation

## 2015-09-30 DIAGNOSIS — Z7982 Long term (current) use of aspirin: Secondary | ICD-10-CM | POA: Insufficient documentation

## 2015-09-30 DIAGNOSIS — J441 Chronic obstructive pulmonary disease with (acute) exacerbation: Secondary | ICD-10-CM | POA: Insufficient documentation

## 2015-09-30 LAB — BASIC METABOLIC PANEL
BUN: 20 mg/dL (ref 7–25)
CHLORIDE: 106 mmol/L (ref 98–110)
CO2: 24 mmol/L (ref 20–31)
CREATININE: 1.22 mg/dL — AB (ref 0.50–1.10)
Calcium: 8.6 mg/dL (ref 8.6–10.2)
Glucose, Bld: 89 mg/dL (ref 65–99)
POTASSIUM: 5 mmol/L (ref 3.5–5.3)
Sodium: 142 mmol/L (ref 135–146)

## 2015-09-30 MED ORDER — FLUTICASONE-SALMETEROL 100-50 MCG/DOSE IN AEPB: 1.0000 | INHALATION_SPRAY | Freq: Two times a day (BID) | RESPIRATORY_TRACT | Status: DC

## 2015-09-30 MED ORDER — AMLODIPINE BESYLATE 5 MG PO TABS: 5.0000 mg | ORAL_TABLET | Freq: Every day | ORAL | Status: DC

## 2015-09-30 MED ORDER — ASPIRIN 325 MG PO TABS: 325.0000 mg | ORAL_TABLET | Freq: Every day | ORAL | Status: DC

## 2015-09-30 MED ORDER — ALBUTEROL SULFATE (2.5 MG/3ML) 0.083% IN NEBU: 2.5000 mg | INHALATION_SOLUTION | Freq: Four times a day (QID) | RESPIRATORY_TRACT | Status: DC | PRN

## 2015-09-30 NOTE — Progress Notes (Signed)
Patient's here for 3 wk f/up of lump in left breast. Patient having constant pain with tenderness, throbbing sensation, rated pain at 9/10.   Patient also c/o of swollen lymph nodes on right side of throat.  Patient requesting medication refill. Patient asking for script for Latuda. She states she was on it before.

## 2015-09-30 NOTE — Progress Notes (Signed)
Subjective:  Patient ID: Taylor Vazquez, female    DOB: 04/16/1966  Age: 49 y.o. MRN: [id number]  CC: Follow-up   HPI Taylor Vazquez is a 49 year old female with a history of COPD, asthma, diastolic CHF who comes in today for follow-up on her mammogram report for left breast mass. Ultrasound and mammogram report discussed with the patient; revealed a benign left fibroadenoma. The patient complains of pain in the left breast and would like to have the mass surgically removed.  Her asthma symptoms are not controlled and she has to use her albuterol MDI every day. Also complains of left submandibular lymphadenopathy but denies any cold symptoms she however endorses the presence of dental caries and is needing to see a dentist.  She remains compliant with her antihypertensives.  Outpatient Prescriptions Prior to Visit  Medication Sig Dispense Refill  . acetaminophen (TYLENOL) 325 MG tablet Take 2 tablets (650 mg total) by mouth every 6 (six) hours as needed for mild pain or moderate pain.    Marland Kitchen lisinopril (PRINIVIL,ZESTRIL) 20 MG tablet Take 1 tablet (20 mg total) by mouth daily. 30 tablet 2  . VENTOLIN HFA 108 (90 BASE) MCG/ACT inhaler Inhale 2 puffs into the lungs every 6 (six) hours as needed for wheezing or shortness of breath. 1 each 1  . amLODipine (NORVASC) 5 MG tablet Take 1 tablet (5 mg total) by mouth daily. 30 tablet 1  . aspirin 325 MG tablet Take 1 tablet (325 mg total) by mouth daily.    . predniSONE (DELTASONE) 10 MG tablet   0  . azithromycin (ZITHROMAX) 250 MG tablet   0  . levofloxacin (LEVAQUIN) 750 MG tablet Take 1 tablet (750 mg total) by mouth daily. X 7 days (Patient not taking: Reported on 09/29/2015) 7 tablet 0  . predniSONE (DELTASONE) 20 MG tablet   0  . predniSONE (DELTASONE) 50 MG tablet Take 1 tablet daily with breakfast (Patient not taking: Reported on 09/08/2015) 5 tablet 0   No facility-administered medications prior to visit.    ROS Review of  Systems  Constitutional: Negative for activity change, appetite change and fatigue.  HENT: Negative for congestion, sinus pressure and sore throat.   Eyes: Negative for visual disturbance.  Respiratory: Negative for cough, chest tightness, shortness of breath and wheezing.   Cardiovascular: Negative for chest pain and palpitations.  Gastrointestinal: Negative for abdominal pain, constipation and abdominal distention.  Endocrine: Negative for polydipsia.  Genitourinary: Negative for dysuria and frequency.  Musculoskeletal: Negative for back pain and arthralgias.  Skin: Negative for rash.  Neurological: Negative for tremors, light-headedness and numbness.  Hematological: Does not bruise/bleed easily.  Psychiatric/Behavioral: Negative for behavioral problems and agitation.    Objective:  BP 114/75 mmHg  Pulse 82  Temp(Src) 98.2 F (36.8 C) (Oral)  Resp 16  Ht 5\' 3"  (1.6 m)  Wt 233 lb (105.688 kg)  BMI 41.28 kg/m2  SpO2 98%  LMP 09/23/2015 (Exact Date)  BP/Weight 09/30/2015 09/29/2015 [id number]  Systolic BP [id number]  Diastolic BP 75 84 95  Wt. (Lbs) 233 233 232  BMI 41.28 41.28 41.11      Physical Exam  Constitutional: She is oriented to person, place, and time. She appears well-developed and well-nourished.  Neck:  Left submandibular lymphadenopathy which is mildly tender  Cardiovascular: Normal rate, normal heart sounds and intact distal pulses.   No murmur heard. Pulmonary/Chest: Effort normal and breath sounds normal. She has no wheezes. She has no rales. She exhibits  no tenderness.  Abdominal: Soft. Bowel sounds are normal. She exhibits no distension and no mass. There is no tenderness.  Musculoskeletal: Normal range of motion.  Neurological: She is alert and oriented to person, place, and time.     Assessment & Plan:   1. Essential hypertension Controlled, continue lisinopril Basic metabolic panel sent off - amLODipine (NORVASC) 5 MG tablet; Take 1 tablet (5  mg total) by mouth daily.  Dispense: 30 tablet; Refill: 3 - Basic Metabolic Panel  2. COPD exacerbation (HCC) - Fluticasone-Salmeterol (ADVAIR) 100-50 MCG/DOSE AEPB; Inhale 1 puff into the lungs 2 (two) times daily.  Dispense: 1 each; Refill: 3 - albuterol (PROVENTIL) (2.5 MG/3ML) 0.083% nebulizer solution; Take 3 mLs (2.5 mg total) by nebulization every 6 (six) hours as needed for wheezing or shortness of breath.  Dispense: 150 mL; Refill: 2  3. Asthma, chronic, mild intermittent, uncomplicated Uncontrolled with frequent exacerbations and so I have added Advair to regimen.  4. Left breast fibroadenoma Results of mammogram discussed with the patient and she would like to have it surgically removed due to the pain. I have advised her to apply for the [place] discount to facilitate this process and she will get back in touch with the clinic once this has been approved  Advised the lymphadenopathy could be due to dental caries. Meds ordered this encounter  Medications  . amLODipine (NORVASC) 5 MG tablet    Sig: Take 1 tablet (5 mg total) by mouth daily.    Dispense:  30 tablet    Refill:  3  . aspirin 325 MG tablet    Sig: Take 1 tablet (325 mg total) by mouth daily.    Dispense:  30 tablet    Refill:  2  . Fluticasone-Salmeterol (ADVAIR) 100-50 MCG/DOSE AEPB    Sig: Inhale 1 puff into the lungs 2 (two) times daily.    Dispense:  1 each    Refill:  3  . albuterol (PROVENTIL) (2.5 MG/3ML) 0.083% nebulizer solution    Sig: Take 3 mLs (2.5 mg total) by nebulization every 6 (six) hours as needed for wheezing or shortness of breath.    Dispense:  150 mL    Refill:  2    Follow-up: No Follow-up on file.   Arnoldo Morale MD

## 2015-10-03 ENCOUNTER — Telehealth: Payer: Self-pay | Admitting: *Deleted

## 2015-10-03 LAB — CYTOLOGY - PAP

## 2015-10-03 NOTE — Telephone Encounter (Signed)
-----   Message from Arnoldo Morale, MD sent at 09/30/2015  2:38 PM EST ----- Please inform the patient that mammogram reveals left breast mass is a benign fibroadenoma; I will discuss the results further at her follow-up visit.

## 2015-10-03 NOTE — Telephone Encounter (Signed)
Verified name and date of birth and gave results of mammogram-patient appreciated information and had no questions.

## 2015-10-04 ENCOUNTER — Telehealth: Payer: Self-pay

## 2015-10-04 NOTE — Telephone Encounter (Signed)
Patient returned my call. Patient verified name and DOB. Patient was given her lab results, verbalized she understood with no further questions.

## 2015-10-04 NOTE — Telephone Encounter (Signed)
-----   Message from Arnoldo Morale, MD sent at 10/03/2015  8:39 AM EST ----- Labs are stable.

## 2015-10-04 NOTE — Telephone Encounter (Signed)
CMA called patient, patient did not answer. Left a message for patient to return my call once she receive the VM.

## 2015-11-04 ENCOUNTER — Ambulatory Visit: Payer: Self-pay | Admitting: Family Medicine

## 2015-11-11 ENCOUNTER — Ambulatory Visit: Payer: Self-pay | Admitting: Family Medicine

## 2015-11-14 ENCOUNTER — Ambulatory Visit: Payer: Self-pay | Admitting: Family Medicine

## 2016-03-09 IMAGING — CT CT ANGIO CHEST
2 of 6 series · 18 of 36 positions shown · IV contrast (APPLIED)
Comparison: No priors.

CLINICAL DATA: Sharp left-sided chest pain. Recent hospitalization.

EXAM:
CT ANGIOGRAPHY CHEST WITH CONTRAST
TECHNIQUE: Multidetector CT imaging of the chest was performed using the
standard protocol during bolus administration of intravenous
contrast. Multiplanar CT image reconstructions and MIPs were
obtained to evaluate the vascular anatomy.
CONTRAST:  100 mL of Isovue 370.

[Series 5: pe 1.0 thins · axial · 0.70mm/px · z∈[-540,-336]mm · 17 of 229 slices shown]
[im 13/229  lung]
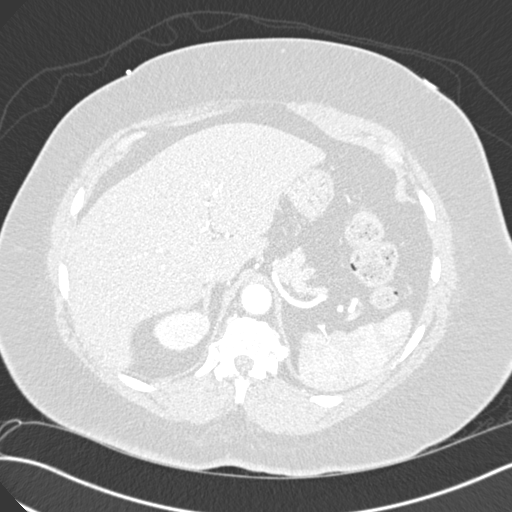
[im 26/229  mediastinal]
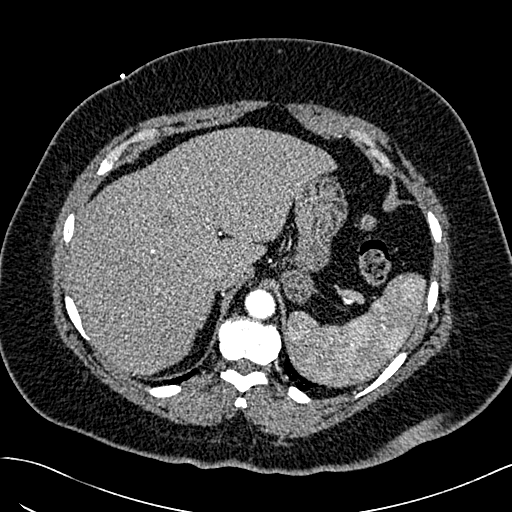
[im 39/229  lung]
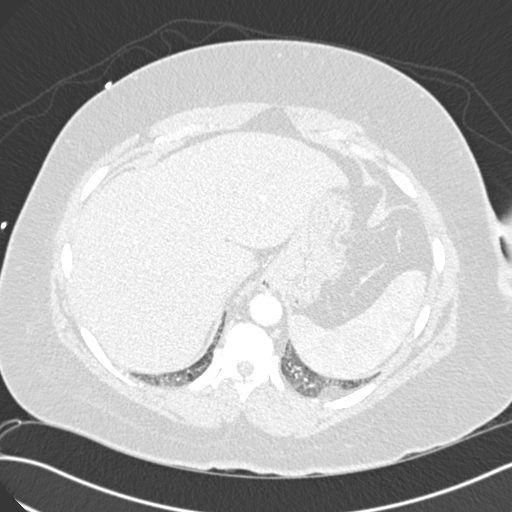
[im 51/229  mediastinal]
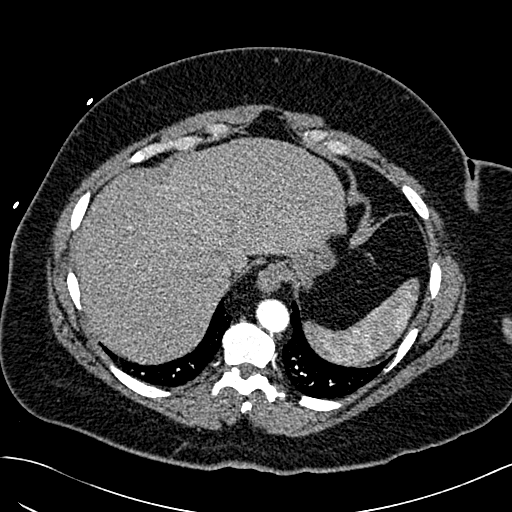
[im 64/229  lung]
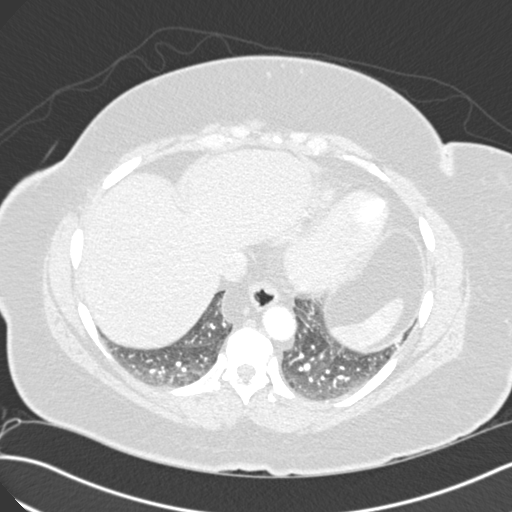
[im 77/229  mediastinal]
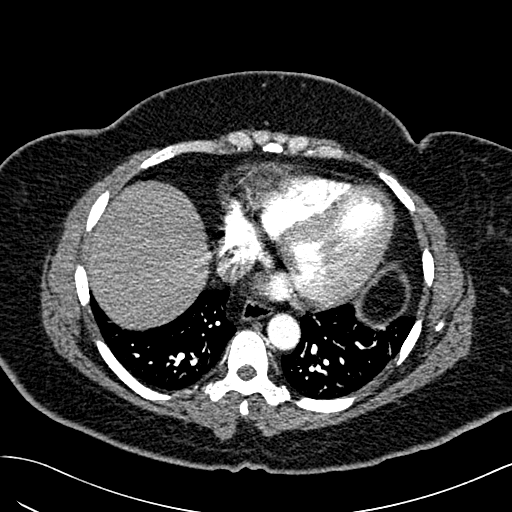
[im 89/229  lung]
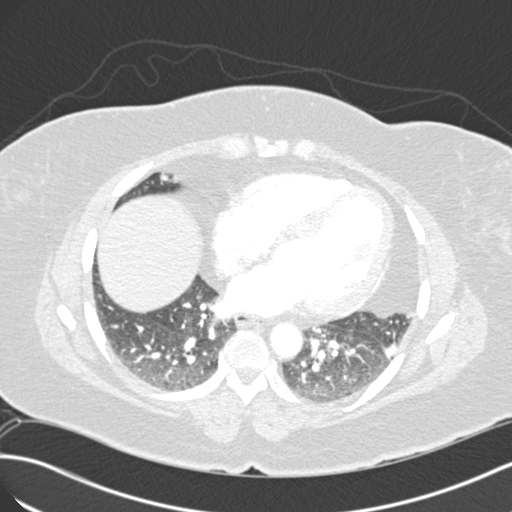
[im 102/229  mediastinal]
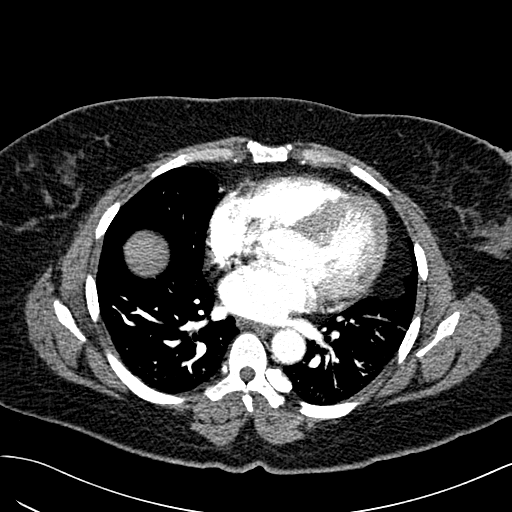
[im 115/229  lung]
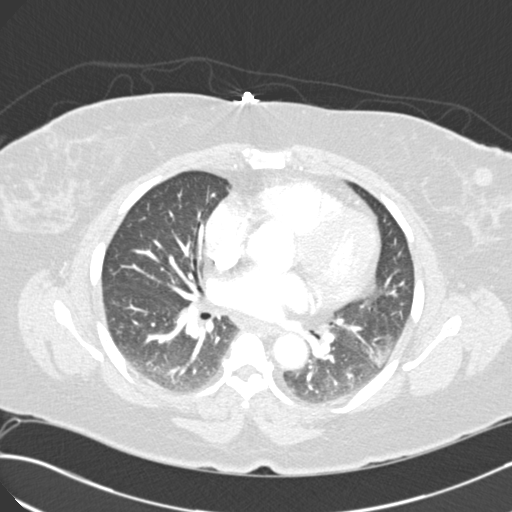
[im 127/229  mediastinal]
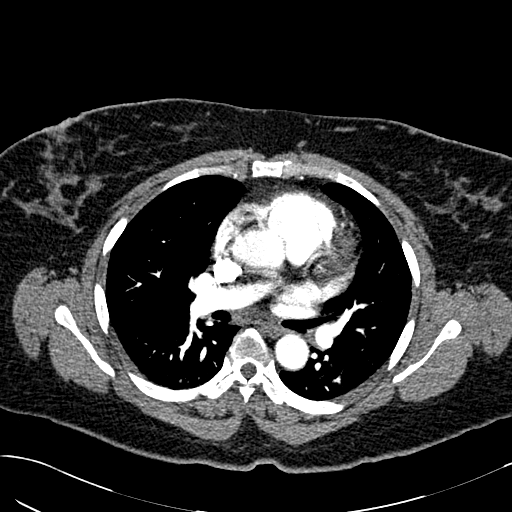
[im 140/229  lung]
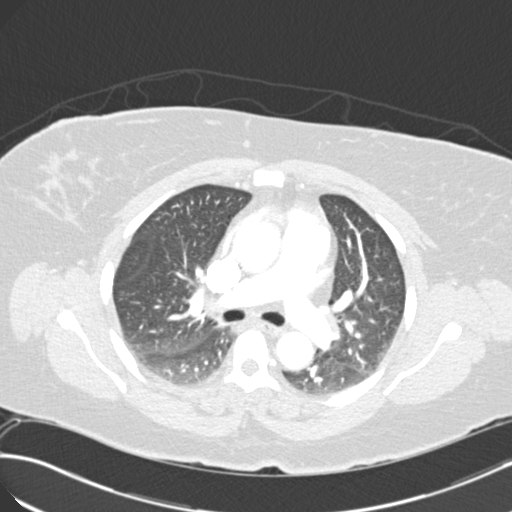
[im 153/229  mediastinal]
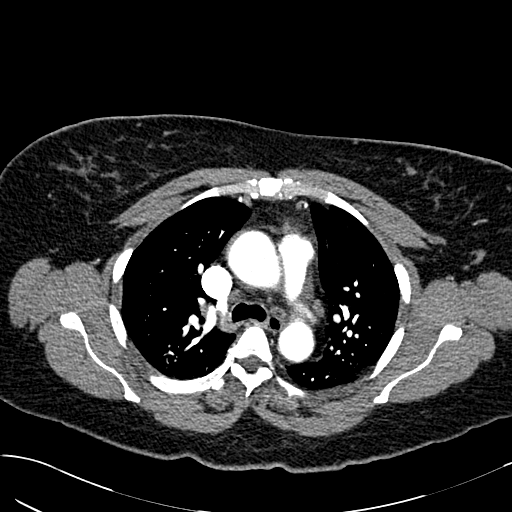
[im 165/229  lung]
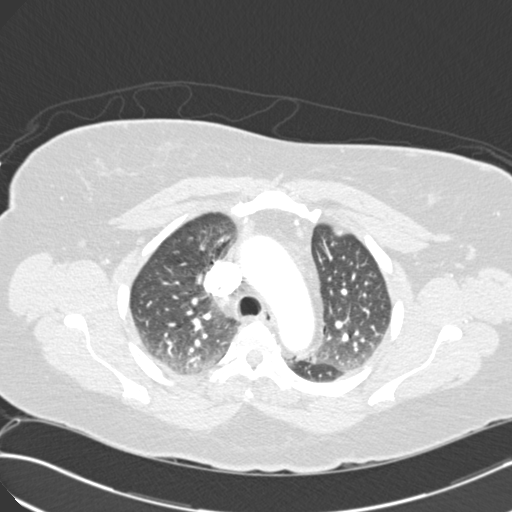
[im 178/229  mediastinal]
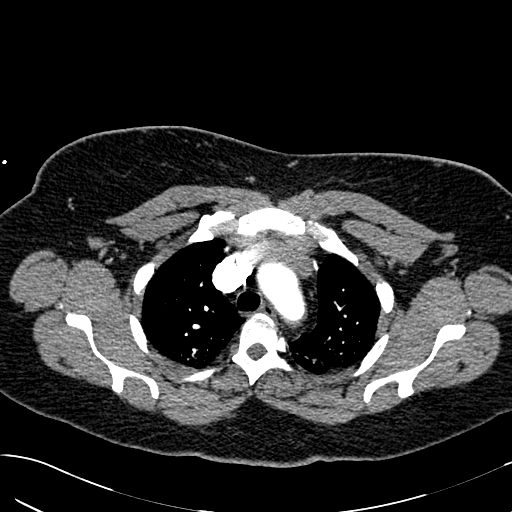
[im 191/229  lung]
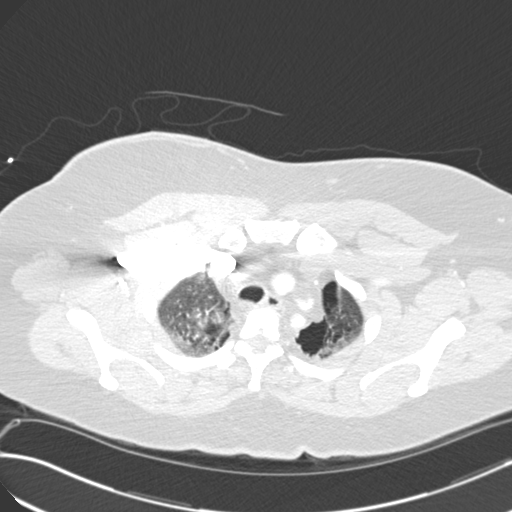
[im 203/229  mediastinal]
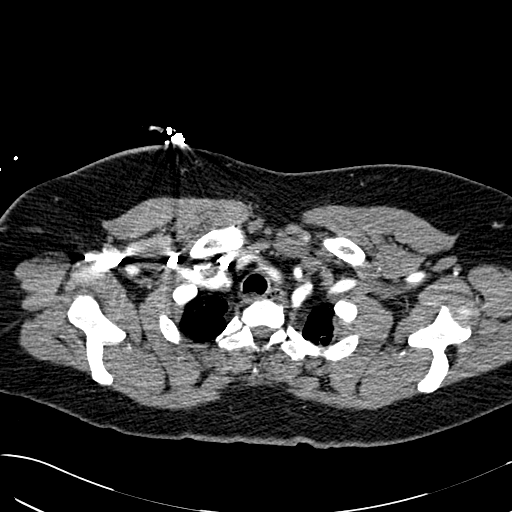
[im 216/229  lung]
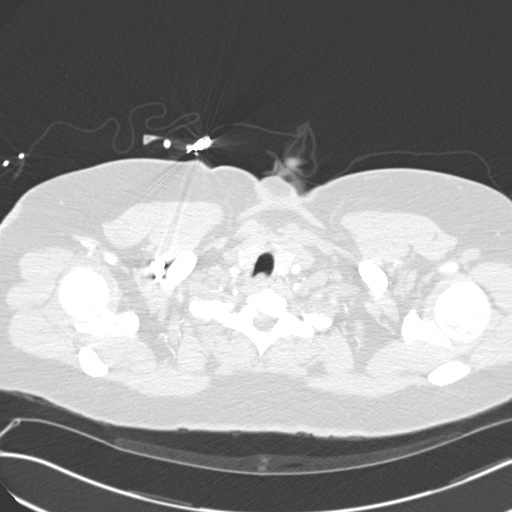

[Series 7: cor pe 2.0 mpr · coronal · 0.48mm/px · 1 of 118 slices shown]
[im 59/118  mediastinal]
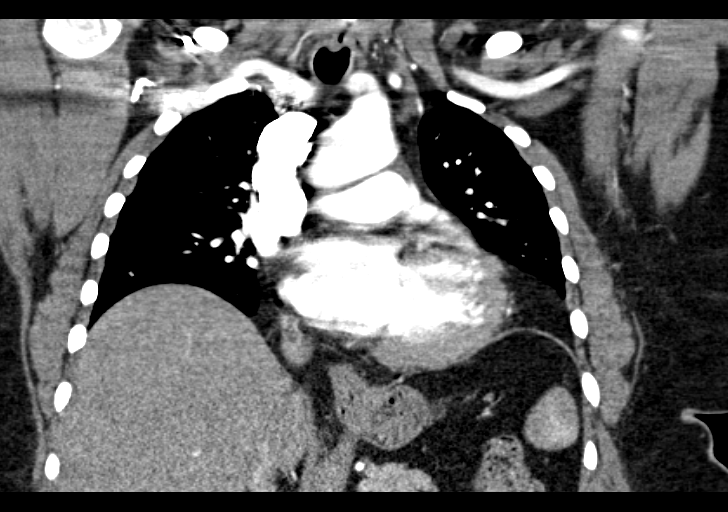

[18 of 36 positions shown; findings below may reference images not displayed]

FINDINGS: Mediastinum: No filling defects within the pulmonary arterial tree
to suggest underlying pulmonary embolism. Heart size is borderline
enlarged. There is no significant pericardial fluid, thickening or
pericardial calcification. Bovine thoracic aortic arch (normal
anatomical variant) incidentally noted. No pathologically enlarged
mediastinal or hilar lymph nodes. Small hiatal hernia.

Lungs/Pleura: Linear scarring in the inferior segment of the
lingula. No acute consolidative airspace disease. No pleural
effusions. There is a background of generally mild paraseptal
emphysema, however, in the apices of lungs bilaterally there are
multiple subpleural bulla and blebs. In the medial aspect of the
left apex there is also an irregular-shaped gas containing
collection which does not appear typical for a usual subpleural
bulla or bleb, and is concerning for potential pneumothorax,
possibly from bronchopleural fistula (this is most evident on the
coronal reconstructions).

Upper Abdomen: Unremarkable.

Musculoskeletal: There are no aggressive appearing lytic or blastic
lesions noted in the visualized portions of the skeleton.

Review of the MIP images confirms the above findings.
IMPRESSION: 1. No evidence of pulmonary embolism.
2. Paraseptal emphysema most notably in the lung apices where there
are multiple subpleural bulla and blebs. The appearance in the
medial aspect of the left apex is concerning for potential small
pneumothorax and possible bronchopleural fistula. If this is a
pneumothorax, duct appears to be contained at this time. Surgical
consultation in the near future is recommended to consider potential
blebectomy.
These results were called by telephone at the time of interpretation
on 06/01/2014 at [DATE] to Dr. ROBERTO TIGER, who verbally
acknowledged these results.

## 2016-03-13 ENCOUNTER — Ambulatory Visit: Payer: Self-pay | Attending: Family Medicine | Admitting: Physician Assistant

## 2016-03-13 VITALS — BP 162/120 | HR 85 | Temp 98.0°F | Resp 14 | Ht 63.0 in | Wt 225.6 lb

## 2016-03-13 DIAGNOSIS — G43909 Migraine, unspecified, not intractable, without status migrainosus: Secondary | ICD-10-CM

## 2016-03-13 DIAGNOSIS — R631 Polydipsia: Secondary | ICD-10-CM

## 2016-03-13 DIAGNOSIS — I10 Essential (primary) hypertension: Secondary | ICD-10-CM

## 2016-03-13 LAB — BASIC METABOLIC PANEL
BUN: 12 mg/dL (ref 7–25)
CALCIUM: 8.7 mg/dL (ref 8.6–10.2)
CO2: 23 mmol/L (ref 20–31)
Chloride: 109 mmol/L (ref 98–110)
Creat: 0.88 mg/dL (ref 0.50–1.10)
Glucose, Bld: 100 mg/dL — ABNORMAL HIGH (ref 65–99)
POTASSIUM: 4.1 mmol/L (ref 3.5–5.3)
SODIUM: 141 mmol/L (ref 135–146)

## 2016-03-13 LAB — GLUCOSE, POCT (MANUAL RESULT ENTRY): POC GLUCOSE: 119 mg/dL — AB (ref 70–99)

## 2016-03-13 MED ORDER — AMLODIPINE BESYLATE 10 MG PO TABS: 10.0000 mg | ORAL_TABLET | Freq: Every day | ORAL | Status: DC

## 2016-03-13 MED ORDER — ZOLPIDEM TARTRATE 5 MG PO TABS: ORAL_TABLET | ORAL | Status: DC

## 2016-03-13 MED ORDER — TOPIRAMATE 50 MG PO TABS: 50.0000 mg | ORAL_TABLET | Freq: Two times a day (BID) | ORAL | Status: DC

## 2016-03-13 MED FILL — ?LISINOPRIL 20 MG TABLET: 20 | 30 days supply | Qty: 30 | Fill #1

## 2016-03-13 MED FILL — !ADVAIR 100/50 DISKUS: 100-50 | 30 days supply | Qty: 60 | Fill #1

## 2016-03-13 MED FILL — AMLODIPINE BESYLATE 5 MG TA: 5 | 30 days supply | Qty: 30 | Fill #1

## 2016-03-13 MED FILL — VENTOLIN HFA 90 MCG INHALER: 108 (90 BAS | 25 days supply | Qty: 18 | Fill #1

## 2016-03-13 MED FILL — TOPIRAMATE 50 MG TABLET: 50 | 30 days supply | Qty: 60 | Fill #0

## 2016-03-13 NOTE — Addendum Note (Signed)
Addended byBrayton Caves on: 03/13/2016 02:57 PM   Modules accepted: Orders

## 2016-03-13 NOTE — Progress Notes (Signed)
Pt here for HA that she has been experiencing for 3 weeks now. Pt reports past hx of migraines. Pt rated HA at a 10 described as pressure and throbbing. Pt has been taking tylenol, advil, and excedrin but nothing has helped. Pt reports the HA makes her cry and wakes her out of her sleep at night. Pt reports feeling fatigued, body aches, and being unable to sleep. Pt requests something to help her sleep.

## 2016-03-13 NOTE — Patient Instructions (Signed)
I gave some Ambien as needed for sleep. You can also try over the counter Melatonin for sleep. Can get at [facility].  Increase your Norvasc to 10 mg daily Return in 2 weeks to see Dr. Jarold Song Lab work today

## 2016-03-13 NOTE — Progress Notes (Signed)
Chief Complaint: Headache, difficulty sleeping; thirsty all the time  Subjective: 50 yo female presents to walk in clinic with multiple complaints: 1. Migraines. Has had sine 50 years of age. Last 3-4 mo almost daily. Right temple. Wakes her up from sleep. +nausea, no vomiting. No photophobia. Taking Excedrin and Tylenol very frequently with temporary relief.  2. Difficulty sleeping-can't settle down to rest. Always jittery. Sees every hour go by at night.   3. Thirsty all the time. Drinking 12 bottles of water per day. Frequent urination. Eating regular meals. No change in weight.   ROS:  GEN: denies fever or chills, denies change in weight Skin: denies lesions or rashes HEENT: denies headache, earache, epistaxis, sore throat, or neck pain LUNGS: denies SHOB, dyspnea, PND, orthopnea CV: denies CP or palpitations ABD: denies abd pain, N or V EXT: denies muscle spasms or swelling; no pain in lower ext, no weakness NEURO: denies numbness or tingling, denies sz, stroke or TIA   Objective:  Filed Vitals:   03/13/16 1134  BP: 162/120  Pulse: 85  Temp: 98 F (36.7 C)  TempSrc: Oral  Resp: 14  Height: 5\' 3"  (1.6 m)  Weight: 225 lb 9.6 oz (102.331 kg)  SpO2: 94%    General: in no acute distress. HEENT: no pallor, no icterus, moist oral mucosa, no JVD, no lymphadenopathy Heart: Normal  s1 &s2  Regular rate and rhythm, without murmurs, rubs, gallops. Lungs: Clear to auscultation bilaterally. Abdomen: Soft, nontender, nondistended, positive bowel sounds. Extremities: No clubbing cyanosis or edema with positive pedal pulses. Neuro: Alert, awake, oriented x3, nonfocal.  Pertinent Lab Results:CBG and A1C now   Medications: Prior to Admission medications   Medication Sig Start Date End Date Taking? Authorizing Provider  acetaminophen (TYLENOL) 325 MG tablet Take 2 tablets (650 mg total) by mouth every 6 (six) hours as needed for mild pain or moderate pain. 06/10/15  Yes Esperanza SheetsUlugbek N  Buriev, MD  albuterol (PROVENTIL) (2.5 MG/3ML) 0.083% nebulizer solution Take 3 mLs (2.5 mg total) by nebulization every 6 (six) hours as needed for wheezing or shortness of breath. 09/30/15  Yes Jaclyn ShaggyEnobong Amao, MD  amLODipine (NORVASC) 10 MG tablet Take 1 tablet (10 mg total) by mouth daily. 03/13/16  Yes Enedina Pair Netta CedarsS Dragon Thrush, PA-C  Fluticasone-Salmeterol (ADVAIR) 100-50 MCG/DOSE AEPB Inhale 1 puff into the lungs 2 (two) times daily. 09/30/15  Yes Jaclyn ShaggyEnobong Amao, MD  VENTOLIN HFA 108 (90 BASE) MCG/ACT inhaler Inhale 2 puffs into the lungs every 6 (six) hours as needed for wheezing or shortness of breath. 09/08/15  Yes Jaclyn ShaggyEnobong Amao, MD  aspirin 325 MG tablet Take 1 tablet (325 mg total) by mouth daily. Patient not taking: Reported on 03/13/2016 09/30/15   Jaclyn ShaggyEnobong Amao, MD  azithromycin (ZITHROMAX) 250 MG tablet Reported on 03/13/2016 06/14/15   Historical Provider, MD  lisinopril (PRINIVIL,ZESTRIL) 20 MG tablet Take 1 tablet (20 mg total) by mouth daily. 09/08/15   Jaclyn ShaggyEnobong Amao, MD  topiramate (TOPAMAX) 50 MG tablet Take 1 tablet (50 mg total) by mouth 2 (two) times daily. 03/13/16   Vivianne Masteriffany S Zakyla Tonche, PA-C  zolpidem (AMBIEN) 5 MG tablet 5 mg  Take 1-2 tablets hs prn insomnia 03/13/16   Vivianne Masteriffany S Kenaz Olafson, PA-C    Assessment: 1. Migraines 2. Insomnia/Jitteriness 3. Polydipsia 4. HTN-not controlled  Plan: Try Topamax Cont OTC meds as needed Ambien prn and Melatonin Check TSH, CBG, A1C, TSH Increase Norvasc  Follow up:4 weeks with Dr. Venetia NightAmao  The patient was given clear instructions to go  to ER or return to medical center if symptoms don't improve, worsen or new problems develop. The patient verbalized understanding. The patient was told to call to get lab results if they haven't heard anything in the next week.   This note has been created with Surveyor, quantity. Any transcriptional errors are unintentional.   Zettie Pho, PA-C 03/13/2016, 12:05 PM

## 2016-03-14 LAB — TSH: TSH: 1.01 m[IU]/L

## 2016-03-14 LAB — HEMOGLOBIN A1C
HEMOGLOBIN A1C: 5.9 % — AB (ref <5.7)
Mean Plasma Glucose: 123 mg/dL

## 2016-03-27 ENCOUNTER — Telehealth: Payer: Self-pay | Admitting: *Deleted

## 2016-03-27 NOTE — Telephone Encounter (Signed)
Patient verified DOB Patient is aware of labs showing PreDM. Patient is aware of thyroid and kidney test being normal. Patient advised of diet changes and possibly starting a medication if the lifestyle changes do not show a significant change. Patient expressed her understanding and had no further questions at this time.

## 2016-03-27 NOTE — Telephone Encounter (Signed)
-----   Message from Maren Reamer, MD sent at 03/20/2016 11:03 AM EDT ----- Please call pt w/ results.  Labs still show prediabetes. Kidneys and thyroid function test normal. thanks  Dm teaching/   Aim for 30 minutes of exercise most days. Rethink what you drink. Water is great! Aim for 2-3 Carb Choices per meal (30-45 grams) +/- 1 either way.  Aim for 0-15 Carbs per snack if hungry.  Include protein in moderation with your meals and snacks.  Consider reading food labels for Total Carbohydrate and Fat Grams of foods  Consider checking blood glucose (accuchecks/BG) at alternate times per day.  Continue taking medication as directed. Be mindful about how much sugar you are adding to beverages and other foods. Fruit Punch - find one with no sugar  Measure and decrease portions of carbohydrate foods. Make your plate and don't go back for seconds.

## 2016-04-30 ENCOUNTER — Telehealth (HOSPITAL_COMMUNITY): Payer: Self-pay | Admitting: *Deleted

## 2016-04-30 NOTE — Telephone Encounter (Signed)
Telephoned patient at home # and discussed negative pap smear results. HPV was negative. Next pap smear due in 5 years. Patient stated already knew results. Patient voiced understanding.

## 2016-05-09 ENCOUNTER — Telehealth: Payer: Self-pay | Admitting: Family Medicine

## 2016-05-09 NOTE — Telephone Encounter (Signed)
error 

## 2016-05-15 ENCOUNTER — Ambulatory Visit: Payer: Self-pay | Admitting: Family Medicine

## 2016-05-23 ENCOUNTER — Ambulatory Visit: Payer: Self-pay | Admitting: Family Medicine
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
## 2016-06-18 ENCOUNTER — Emergency Department (HOSPITAL_COMMUNITY): Payer: Self-pay

## 2016-06-18 ENCOUNTER — Emergency Department (HOSPITAL_COMMUNITY)
Admission: EM | Admit: 2016-06-18 | Discharge: 2016-06-18 | Disposition: A | Discharge status: Home / Self Care | Payer: Self-pay | Attending: Emergency Medicine | Admitting: Emergency Medicine

## 2016-06-18 ENCOUNTER — Encounter (HOSPITAL_COMMUNITY): Payer: Self-pay

## 2016-06-18 DIAGNOSIS — Z79899 Other long term (current) drug therapy: Secondary | ICD-10-CM | POA: Insufficient documentation

## 2016-06-18 DIAGNOSIS — I110 Hypertensive heart disease with heart failure: Secondary | ICD-10-CM | POA: Insufficient documentation

## 2016-06-18 DIAGNOSIS — F17210 Nicotine dependence, cigarettes, uncomplicated: Secondary | ICD-10-CM | POA: Insufficient documentation

## 2016-06-18 DIAGNOSIS — J449 Chronic obstructive pulmonary disease, unspecified: Secondary | ICD-10-CM | POA: Insufficient documentation

## 2016-06-18 DIAGNOSIS — Z7982 Long term (current) use of aspirin: Secondary | ICD-10-CM | POA: Insufficient documentation

## 2016-06-18 DIAGNOSIS — K2970 Gastritis, unspecified, without bleeding: Secondary | ICD-10-CM | POA: Insufficient documentation

## 2016-06-18 DIAGNOSIS — Z8673 Personal history of transient ischemic attack (TIA), and cerebral infarction without residual deficits: Secondary | ICD-10-CM | POA: Insufficient documentation

## 2016-06-18 DIAGNOSIS — R1010 Upper abdominal pain, unspecified: Secondary | ICD-10-CM

## 2016-06-18 DIAGNOSIS — I509 Heart failure, unspecified: Secondary | ICD-10-CM | POA: Insufficient documentation

## 2016-06-18 LAB — COMPREHENSIVE METABOLIC PANEL
ALBUMIN: 3.1 g/dL — AB (ref 3.5–5.0)
ALK PHOS: 81 U/L (ref 38–126)
ALT: 15 U/L (ref 14–54)
AST: 19 U/L (ref 15–41)
Anion gap: 7 (ref 5–15)
BILIRUBIN TOTAL: 0.2 mg/dL — AB (ref 0.3–1.2)
BUN: 9 mg/dL (ref 6–20)
CALCIUM: 8.7 mg/dL — AB (ref 8.9–10.3)
CO2: 21 mmol/L — AB (ref 22–32)
CREATININE: 1.09 mg/dL — AB (ref 0.44–1.00)
Chloride: 111 mmol/L (ref 101–111)
GFR calc Af Amer: >60 mL/min (ref >60)
GFR calc non Af Amer: 59 mL/min — ABNORMAL LOW (ref >60)
GLUCOSE: 109 mg/dL — AB (ref 65–99)
Potassium: 4.2 mmol/L (ref 3.5–5.1)
SODIUM: 139 mmol/L (ref 135–145)
TOTAL PROTEIN: 6 g/dL — AB (ref 6.5–8.1)

## 2016-06-18 LAB — CBC WITH DIFFERENTIAL/PLATELET
BASOS ABS: 0 10*3/uL (ref 0.0–0.1)
BASOS PCT: 0 %
EOS ABS: 0.3 10*3/uL (ref 0.0–0.7)
EOS PCT: 5 %
HCT: 44.6 % (ref 36.0–46.0)
Hemoglobin: 14.9 g/dL (ref 12.0–15.0)
Lymphocytes Relative: 36 %
Lymphs Abs: 2.3 10*3/uL (ref 0.7–4.0)
MCH: 29.1 pg (ref 26.0–34.0)
MCHC: 33.4 g/dL (ref 30.0–36.0)
MCV: 87.1 fL (ref 78.0–100.0)
MONO ABS: 0.7 10*3/uL (ref 0.1–1.0)
Monocytes Relative: 11 %
Neutro Abs: 3 10*3/uL (ref 1.7–7.7)
Neutrophils Relative %: 48 %
PLATELETS: 227 10*3/uL (ref 150–400)
RBC: 5.12 MIL/uL — ABNORMAL HIGH (ref 3.87–5.11)
RDW: 14.4 % (ref 11.5–15.5)
WBC: 6.4 10*3/uL (ref 4.0–10.5)

## 2016-06-18 LAB — I-STAT BETA HCG BLOOD, ED (MC, WL, AP ONLY): I-stat hCG, quantitative: <5 m[IU]/mL (ref <5)

## 2016-06-18 LAB — LIPASE, BLOOD: Lipase: 23 U/L (ref 11–51)

## 2016-06-18 MED ORDER — HYDROMORPHONE HCL 1 MG/ML IJ SOLN
1.0000 mg | Freq: Once | INTRAMUSCULAR | Status: AC
  Administered 2016-06-18: 1 mg via INTRAVENOUS
  Filled 2016-06-18: qty 1

## 2016-06-18 MED ORDER — GI COCKTAIL ~~LOC~~
30.0000 mL | Freq: Once | ORAL | Status: AC
  Administered 2016-06-18: 30 mL via ORAL
  Filled 2016-06-18: qty 30

## 2016-06-18 MED ORDER — SODIUM CHLORIDE 0.9 % IV SOLN
INTRAVENOUS | Status: DC
  Administered 2016-06-18: 09:00:00 via INTRAVENOUS

## 2016-06-18 MED ORDER — IOPAMIDOL (ISOVUE-300) INJECTION 61%
INTRAVENOUS | Status: AC
  Filled 2016-06-18: qty 100

## 2016-06-18 MED ORDER — SODIUM CHLORIDE 0.9 % IV BOLUS (SEPSIS): 1000.0000 mL | Freq: Once | INTRAVENOUS | Status: DC

## 2016-06-18 MED ORDER — OXYCODONE-ACETAMINOPHEN 5-325 MG PO TABS: 1.0000 | ORAL_TABLET | Freq: Four times a day (QID) | ORAL | 0 refills | Status: DC | PRN

## 2016-06-18 NOTE — ED Notes (Signed)
Pt transported to US

## 2016-06-18 NOTE — ED Provider Notes (Signed)
[facility] DEPT Provider Note   CSN: [id number] Arrival date & time: 06/18/16  [id number]     History   Chief Complaint Chief Complaint  Patient presents with  . Abdominal Pain  . Shortness of Breath  . Chest Pain    HPI Taylor Vazquez is a 50 y.o. female.  The history is provided by the patient.  Abdominal Pain   This is a new problem. Episode onset: 5 days ago. The problem occurs constantly. The problem has been gradually worsening (intially was intermittent and now constant in the last 2 days). The pain is associated with eating. The pain is located in the RUQ. The pain is at a severity of 10/10. The pain is severe. Associated symptoms include anorexia. Pertinent negatives include diarrhea, constipation and dysuria. The symptoms are aggravated by eating. Nothing relieves the symptoms.    Past Medical History:  Diagnosis Date  . Asthma   . CHF (congestive heart failure) ([place])   . Depression   . Hypertension   . Obesity   . Sickle cell trait ([place])   . Stroke [facility])     Patient Active Problem List   Diagnosis Date Noted  . 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 Left-sided weakness 06/14/2015  . COPD exacerbation ([place]) 06/09/2015  . Acute diastolic heart failure ([place]) 06/09/2015  . Shortness of breath 06/08/2015  . Chest pain at rest 06/08/2015  . Chest pain 06/08/2015  . Cocaine abuse 05/19/2014  . Chest wall pain 05/17/2014  . Tobacco use disorder 05/17/2014  . CVA (cerebral infarction) 05/16/2014  . Asthma, chronic 05/16/2014  . Chest discomfort 05/16/2014  . Bug bite 05/16/2014  . Hypertension 04/28/2012    Past Surgical History:  Procedure Laterality Date  . NO PAST SURGERIES      OB History    Gravida Para Term Preterm AB Living   8 5   5 1 4    SAB TAB Ectopic Multiple Live Births     1             Home Medications    Prior to Admission medications   Medication Sig Start Date End Date Taking? Authorizing Provider    acetaminophen (TYLENOL) 325 MG tablet Take 2 tablets (650 mg total) by mouth every 6 (six) hours as needed for mild pain or moderate pain. 06/10/15   Kinnie Feil, MD  albuterol (PROVENTIL) (2.5 MG/3ML) 0.083% nebulizer solution Take 3 mLs (2.5 mg total) by nebulization every 6 (six) hours as needed for wheezing or shortness of breath. 09/30/15   Arnoldo Morale, MD  amLODipine (NORVASC) 10 MG tablet Take 1 tablet (10 mg total) by mouth daily. 03/13/16   Tiffany Daneil Dan, PA-C  aspirin 325 MG tablet Take 1 tablet (325 mg total) by mouth daily. Patient not taking: Reported on 03/13/2016 09/30/15   Arnoldo Morale, MD  azithromycin (ZITHROMAX) 250 MG tablet Reported on 03/13/2016 06/14/15   Historical Provider, MD  Fluticasone-Salmeterol (ADVAIR) 100-50 MCG/DOSE AEPB Inhale 1 puff into the lungs 2 (two) times daily. 09/30/15   Arnoldo Morale, MD  lisinopril (PRINIVIL,ZESTRIL) 20 MG tablet Take 1 tablet (20 mg total) by mouth daily. 09/08/15   Arnoldo Morale, MD  topiramate (TOPAMAX) 50 MG tablet Take 1 tablet (50 mg total) by mouth 2 (two) times daily. 03/13/16   Tiffany Daneil Dan, PA-C  VENTOLIN HFA 108 (90 BASE) MCG/ACT inhaler Inhale 2 puffs into the lungs every 6 (six) hours as needed for wheezing or shortness of  breath. 09/08/15   Arnoldo Morale, MD  zolpidem (AMBIEN) 5 MG tablet 5 mg  Take 1-2 tablets hs prn insomnia 03/13/16   Brayton Caves, PA-C    Family History Family History  Problem Relation Age of Onset  . Hypertension Mother   . Hypertension Father   . Cancer Father   . Hypertension Other   . Sickle cell trait Other   . Diabetes Other   . Other Neg Hx     Social History Social History  Substance Use Topics  . Smoking status: Current Some Day Smoker    Packs/day: 0.25    Types: Cigarettes  . Smokeless tobacco: Never Used  . Alcohol use 0.0 oz/week     Comment: occ     Allergies   Bee venom and Shellfish allergy   Review of Systems Review of Systems  Respiratory: Positive for cough.         Occasional coughing from her COPD but feels that it is under control currently  Gastrointestinal: Positive for anorexia. Negative for constipation and diarrhea.  Genitourinary: Negative for dysuria.  All other systems reviewed and are negative.    Physical Exam Updated Vital Signs There were no vitals taken for this visit.  Physical Exam  Constitutional: She is oriented to person, place, and time. She appears well-developed and well-nourished. She appears distressed.  Appears uncomfortable  HENT:  Head: Normocephalic and atraumatic.  Mouth/Throat: Oropharynx is clear and moist.  Eyes: Conjunctivae and EOM are normal. Pupils are equal, round, and reactive to light.  Neck: Normal range of motion. Neck supple.  Cardiovascular: Normal rate, regular rhythm and intact distal pulses.   No murmur heard. Pulmonary/Chest: Effort normal and breath sounds normal. No respiratory distress. She has no wheezes. She has no rales.  Abdominal: Soft. She exhibits no distension. There is tenderness in the right upper quadrant. There is guarding and positive Murphy's sign. There is no rebound and no CVA tenderness. No hernia.  Musculoskeletal: Normal range of motion. She exhibits edema. She exhibits no tenderness.  Trace swelling in the LLE around the ankle without tenderness  Neurological: She is alert and oriented to person, place, and time.  Skin: Skin is warm and dry. No rash noted. No erythema.  Psychiatric: She has a normal mood and affect. Her behavior is normal.  Nursing note and vitals reviewed.    ED Treatments / Results  Labs (all labs ordered are listed, but only abnormal results are displayed) Labs Reviewed  CBC WITH DIFFERENTIAL/PLATELET - Abnormal; Notable for the following:       Result Value   RBC 5.12 (*)    All other components within normal limits  COMPREHENSIVE METABOLIC PANEL - Abnormal; Notable for the following:    CO2 21 (*)    Glucose, Bld 109 (*)    Creatinine,  Ser 1.09 (*)    Calcium 8.7 (*)    Total Protein 6.0 (*)    Albumin 3.1 (*)    Total Bilirubin 0.2 (*)    GFR calc non Af Amer 59 (*)    All other components within normal limits  LIPASE, BLOOD  URINALYSIS, ROUTINE W REFLEX MICROSCOPIC (NOT AT [facility])  I-STAT BETA HCG BLOOD, ED ([facility], [facility], [facility] ONLY)    EKG  EKG Interpretation None       Radiology Ct Abdomen Pelvis W Contrast  Result Date: 06/18/2016 CLINICAL DATA:  Right upper quadrant pain for 5-6 days. Nausea and intermittent sharp pain. Right flank and back  pain. Shortness of breath. EXAM: CT ABDOMEN AND PELVIS WITH CONTRAST TECHNIQUE: Multidetector CT imaging of the abdomen and pelvis was performed using the standard protocol following bolus administration of intravenous contrast. CONTRAST:  100 cc Isovue-300. COMPARISON:  08/18/2014. FINDINGS: Lower chest: Lung bases show mild dependent atelectasis. No pericardial or pleural effusion. Heart is at the upper limits of normal in size. Small hiatal hernia. Hepatobiliary: Liver and gallbladder are unremarkable. No biliary ductal dilatation. Pancreas: Negative. Spleen: Negative. Adrenals/Urinary Tract: Adrenal glands and right kidney are unremarkable. Fluid density exophytic 1.3 cm lesion along the renal hilum is unchanged and likely a cyst. Ureters are decompressed. Bladder is unremarkable. Stomach/Bowel: Small hiatal hernia. Stomach, small bowel, appendix and colon are unremarkable. Vascular/Lymphatic: Minimal atherosclerotic calcification of the arterial vasculature. No pathologically enlarged lymph nodes. Reproductive: Uterus is visualized. Probable 2.3 cm fundal fibroid. No adnexal mass. Other: Small pelvic free fluid. Mesenteries and peritoneum are unremarkable. Musculoskeletal: No worrisome lytic or sclerotic lesions. Degenerative changes are seen in the right sacroiliac joint and spine. IMPRESSION: 1. No findings to explain the patient's symptoms. 2. Small pelvic free fluid, likely  physiologic. 3. Minimal aortic atherosclerosis. Electronically Signed   By: Lorin Picket M.D.   On: 06/18/2016 12:54   US Abdomen Limited Ruq  Result Date: 06/18/2016 CLINICAL DATA:  Upper abdominal pain for 5 days EXAM: US ABDOMEN LIMITED - RIGHT UPPER QUADRANT COMPARISON:  CT 08/18/2014 FINDINGS: Gallbladder: No gallstones or wall thickening visualized. No sonographic Murphy sign noted by sonographer. Common bile duct: Diameter: Normal caliber, 4 mm. Liver: No focal lesion identified. Within normal limits in parenchymal echogenicity. IMPRESSION: Unremarkable right upper quadrant ultrasound. Electronically Signed   By: Rolm Baptise M.D.   On: 06/18/2016 10:57    Procedures Procedures (including critical care time)  Medications Ordered in ED Medications  0.9 %  sodium chloride infusion ( Intravenous New Bag/Given 06/18/16 0924)  iopamidol (ISOVUE-300) 61 % injection (not administered)  HYDROmorphone (DILAUDID) injection 1 mg (1 mg Intravenous Given 06/18/16 0924)  HYDROmorphone (DILAUDID) injection 1 mg (1 mg Intravenous Given 06/18/16 1052)     Initial Impression / Assessment and Plan / ED Course  I have reviewed the triage vital signs and the nursing notes.  Pertinent labs & imaging results that were available during my care of the patient were reviewed by me and considered in my medical decision making (see chart for details).  Clinical Course   Patient is a 50 year old female with a 5 day history of worsening abdominal pain and nausea. Patient has significant right upper quadrant pain with Murphy's sign. She has no respiratory complaints at this time and despite having a history of CHF has not noticed any recent swelling.  Feel most likely the patient has cholelithiasis as the cause of her pain today. CBC, CMP, UA and lipase pending. Ultrasound of the right upper quadrant pending. Patient given IV pain and nausea control  11:21 AM Labs wnl.  [place] neg for gallstones however on repeat  exam pt still in significant pain in the RUQ with guarding.  She continues to deny any resp sx concerning for PNA, PE or COPD exacerbation.  CT ordered for further evaluation   1:46 PM Ct neg for acute findings.  Will d/c home with pain control.  May be MSK vs gastritis.  Pt states she had been diagnosed with ulcer in the past.  After Gi cocktail pt felt better.  She has zantac at home which she will restart and use maalox prn  Final Clinical Impressions(s) / ED Diagnoses   Final diagnoses:  Upper abdominal pain  Gastritis    New Prescriptions New Prescriptions   No medications on file     Blanchie Dessert, MD 06/18/16 1446

## 2016-06-18 NOTE — ED Triage Notes (Signed)
Pt c/o right upper quadrant pain x 5-6 days. Reports nausea and intermittent severe/sharp pain. Pain radiates to right flank/back. Hx COPD and asthma, smokes 1 pack cigarettes over 2 weeks. Reports shortness of breath slightly worse than normal, w/ relief from home breathing tx.

## 2016-06-20 MED ORDER — IOPAMIDOL (ISOVUE-300) INJECTION 61%
100.0000 mL | Freq: Once | INTRAVENOUS | Status: AC | PRN
  Administered 2016-06-18: 100 mL via INTRAVENOUS

## 2016-07-05 MED FILL — AMLODIPINE BESYLATE 10 MG T: 10 | 30 days supply | Qty: 30 | Fill #0

## 2016-07-05 MED FILL — ?LISINOPRIL 20 MG TABLET: 20 | 30 days supply | Qty: 30 | Fill #2

## 2016-07-05 MED FILL — ALBUTEROL 0.083% INHAL SOLN: (2.5 MG/3ML | 30 days supply | Qty: 90 | Fill #1

## 2016-07-23 ENCOUNTER — Ambulatory Visit: Payer: Self-pay | Admitting: Family Medicine

## 2016-08-14 ENCOUNTER — Ambulatory Visit: Payer: Self-pay | Admitting: Family Medicine

## 2016-08-30 ENCOUNTER — Ambulatory Visit: Payer: Self-pay | Admitting: Family Medicine

## 2016-11-09 ENCOUNTER — Ambulatory Visit: Payer: Self-pay | Admitting: Family Medicine

## 2016-12-14 ENCOUNTER — Ambulatory Visit: Payer: Self-pay | Admitting: Family Medicine

## 2017-01-02 ENCOUNTER — Telehealth: Payer: Self-pay | Admitting: Family Medicine

## 2017-01-02 NOTE — Telephone Encounter (Signed)
Due to frequent No Shows pt was advised she will have to be seen as a walk-in  Pt will be in this Friday, 3/16, at one o'clock to wait as a walk-in to be seen for a rash, referral to breast center, and to be started on DM medication

## 2017-01-05 ENCOUNTER — Encounter (HOSPITAL_COMMUNITY): Payer: Self-pay

## 2017-01-05 ENCOUNTER — Emergency Department (HOSPITAL_COMMUNITY)
Admission: EM | Admit: 2017-01-05 | Discharge: 2017-01-05 | Disposition: A | Discharge status: Home / Self Care | Payer: Self-pay | Attending: Emergency Medicine | Admitting: Emergency Medicine

## 2017-01-05 DIAGNOSIS — R21 Rash and other nonspecific skin eruption: Secondary | ICD-10-CM | POA: Insufficient documentation

## 2017-01-05 DIAGNOSIS — I110 Hypertensive heart disease with heart failure: Secondary | ICD-10-CM | POA: Insufficient documentation

## 2017-01-05 DIAGNOSIS — Z8673 Personal history of transient ischemic attack (TIA), and cerebral infarction without residual deficits: Secondary | ICD-10-CM | POA: Insufficient documentation

## 2017-01-05 DIAGNOSIS — F17210 Nicotine dependence, cigarettes, uncomplicated: Secondary | ICD-10-CM | POA: Insufficient documentation

## 2017-01-05 DIAGNOSIS — Z7982 Long term (current) use of aspirin: Secondary | ICD-10-CM | POA: Insufficient documentation

## 2017-01-05 DIAGNOSIS — J449 Chronic obstructive pulmonary disease, unspecified: Secondary | ICD-10-CM | POA: Insufficient documentation

## 2017-01-05 DIAGNOSIS — K0889 Other specified disorders of teeth and supporting structures: Secondary | ICD-10-CM | POA: Insufficient documentation

## 2017-01-05 DIAGNOSIS — I5031 Acute diastolic (congestive) heart failure: Secondary | ICD-10-CM | POA: Insufficient documentation

## 2017-01-05 MED ORDER — CLOTRIMAZOLE 1 % EX CREA: TOPICAL_CREAM | CUTANEOUS | 0 refills | Status: DC

## 2017-01-05 MED ORDER — HYDROCODONE-ACETAMINOPHEN 5-325 MG PO TABS: 2.0000 | ORAL_TABLET | ORAL | 0 refills | Status: DC | PRN

## 2017-01-05 MED ORDER — OXYCODONE-ACETAMINOPHEN 5-325 MG PO TABS
1.0000 | ORAL_TABLET | ORAL | Status: DC | PRN
  Administered 2017-01-05: 1 via ORAL
  Filled 2017-01-05: qty 1

## 2017-01-05 MED ORDER — AMOXICILLIN 500 MG PO CAPS: 500.0000 mg | ORAL_CAPSULE | Freq: Three times a day (TID) | ORAL | 0 refills | Status: DC

## 2017-01-05 NOTE — Discharge Instructions (Signed)
See your dentist as scheuduled

## 2017-01-05 NOTE — ED Provider Notes (Signed)
[facility] DEPT Provider Note    By signing my name below, I, Bea Graff, attest that this documentation has been prepared under the direction and in the presence of Alyse Low, [place]. Electronically Signed: Bea Graff, ED Scribe. 01/05/17. 10:34 AM.    History   Chief Complaint Chief Complaint  Patient presents with  . Rash  . Dental Pain   The history is provided by the patient and medical records. No language interpreter was used.    Taylor Vazquez is an obese 51 y.o. female with PMHx of asthma, CHF, HTN, CVA and cocaine abuse who presents to the Emergency Department complaining of diffuse dental pain that began four days ago. She reports an associated HA. She states she has a dentist appt next week. She also reports an itching rash to the buttocks, right breast and left upper leg that appeared two months ago. Pt reports her grandchild has a similar rash. She notes she went to the health department and received STD testing that came back negative. She has for taken Ibuprofen for pain with no significant relief. Eating, drinking or touching the teeth increases the dental pain. She denies alleviating factors. She denies nausea, vomiting, fever, chills, drooling, trismus, difficulty breathing or swallowing. Her PCP [facility] at [facility].   Past Medical History:  Diagnosis Date  . Asthma   . CHF (congestive heart failure) ([place])   . Depression   . Hypertension   . Obesity   . Sickle cell trait ([place])   . Stroke [facility])     Patient Active Problem List   Diagnosis Date Noted  . 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 Left-sided weakness 06/14/2015  . COPD exacerbation ([place]) 06/09/2015  . Acute diastolic heart failure ([place]) 06/09/2015  . Shortness of breath 06/08/2015  . Chest pain at rest 06/08/2015  . Chest pain 06/08/2015  . Cocaine abuse 05/19/2014  . Chest wall pain 05/17/2014  . Tobacco use disorder 05/17/2014  . CVA (cerebral infarction) 05/16/2014  .  Asthma, chronic 05/16/2014  . Chest discomfort 05/16/2014  . Bug bite 05/16/2014  . Hypertension 04/28/2012    Past Surgical History:  Procedure Laterality Date  . NO PAST SURGERIES      OB History    Gravida Para Term Preterm AB Living   8 5   5 1 4    SAB TAB Ectopic Multiple Live Births     1             Home Medications    Prior to Admission medications   Medication Sig Start Date End Date Taking? Authorizing Provider  acetaminophen (TYLENOL) 325 MG tablet Take 2 tablets (650 mg total) by mouth every 6 (six) hours as needed for mild pain or moderate pain. Patient not taking: Reported on 06/18/2016 06/10/15   Kinnie Feil, MD  acetaminophen (TYLENOL) 500 MG tablet Take 1,000 mg by mouth every 6 (six) hours as needed for moderate pain.    Historical Provider, MD  albuterol (PROVENTIL) (2.5 MG/3ML) 0.083% nebulizer solution Take 3 mLs (2.5 mg total) by nebulization every 6 (six) hours as needed for wheezing or shortness of breath. 09/30/15   Arnoldo Morale, MD  amLODipine (NORVASC) 10 MG tablet Take 1 tablet (10 mg total) by mouth daily. 03/13/16   Tiffany Daneil Dan, PA-C  amoxicillin (AMOXIL) 500 MG capsule Take 1 capsule (500 mg total) by mouth 3 (three) times daily. 01/05/17   Fransico Meadow, PA-C  aspirin 325 MG tablet Take  1 tablet (325 mg total) by mouth daily. Patient not taking: Reported on 03/13/2016 09/30/15   Arnoldo Morale, MD  clotrimazole (LOTRIMIN) 1 % cream Apply to affected area 2 times daily 01/05/17   Fransico Meadow, PA-C  Fluticasone-Salmeterol (ADVAIR) 100-50 MCG/DOSE AEPB Inhale 1 puff into the lungs 2 (two) times daily. Patient taking differently: Inhale 1 puff into the lungs 2 (two) times daily as needed (for shortness of breath).  09/30/15   Arnoldo Morale, MD  HYDROcodone-acetaminophen (NORCO/VICODIN) 5-325 MG tablet Take 2 tablets by mouth every 4 (four) hours as needed. 01/05/17   Fransico Meadow, PA-C  lisinopril (PRINIVIL,ZESTRIL) 20 MG tablet Take 1 tablet (20 mg  total) by mouth daily. 09/08/15   Arnoldo Morale, MD  Menthol, Topical Analgesic, (BENGAY EX) Apply 1 application topically as needed (for pain).    Historical Provider, MD  oxyCODONE-acetaminophen (PERCOCET/ROXICET) 5-325 MG tablet Take 1 tablet by mouth every 6 (six) hours as needed for severe pain. 06/18/16   Blanchie Dessert, MD  topiramate (TOPAMAX) 50 MG tablet Take 1 tablet (50 mg total) by mouth 2 (two) times daily. Patient taking differently: Take 50 mg by mouth 2 (two) times daily as needed (for headaches).  03/13/16   Tiffany Daneil Dan, PA-C  VENTOLIN HFA 108 (90 BASE) MCG/ACT inhaler Inhale 2 puffs into the lungs every 6 (six) hours as needed for wheezing or shortness of breath. Patient taking differently: Inhale 2 puffs into the lungs every 4 (four) hours as needed for wheezing or shortness of breath.  09/08/15   Arnoldo Morale, MD  zolpidem (AMBIEN) 5 MG tablet 5 mg  Take 1-2 tablets hs prn insomnia Patient taking differently: Take 5 mg by mouth at bedtime as needed for sleep.  03/13/16   Brayton Caves, PA-C    Family History Family History  Problem Relation Age of Onset  . Hypertension Mother   . Hypertension Father   . Cancer Father   . Hypertension Other   . Sickle cell trait Other   . Diabetes Other   . Other Neg Hx     Social History Social History  Substance Use Topics  . Smoking status: Current Some Day Smoker    Packs/day: 0.25    Types: Cigarettes  . Smokeless tobacco: Never Used  . Alcohol use 0.0 oz/week     Comment: occ     Allergies   Bee venom and Shellfish allergy   Review of Systems Review of Systems  Constitutional: Negative for chills and fever.  HENT: Positive for dental problem. Negative for drooling and trouble swallowing.   Respiratory: Negative for shortness of breath.   Gastrointestinal: Negative for nausea and vomiting.  Skin: Positive for rash.  All other systems reviewed and are negative.    Physical Exam Updated Vital Signs BP (!)  183/91 (BP Location: Left Arm)   Pulse 78   Temp 98.2 F (36.8 C) (Oral)   Resp 17   LMP 11/09/2016   SpO2 97%   Physical Exam  Constitutional: She is oriented to person, place, and time. She appears well-developed and well-nourished.  HENT:  Head: Normocephalic and atraumatic.  Mouth/Throat: Uvula is midline, oropharynx is clear and moist and mucous membranes are normal. No trismus in the jaw. Abnormal dentition. Dental caries present. No dental abscesses. No tonsillar exudate.  Neck: Normal range of motion.  Cardiovascular: Normal rate.   Pulmonary/Chest: Effort normal.  Musculoskeletal: Normal range of motion.  Neurological: She is alert and oriented to person,  place, and time.  Skin: Skin is warm and dry. Rash noted.  Multiple raised, dark anular lesions located diffusely on left upper thigh, left buttocks and right breast. Scaly outside, clearing centers.  Psychiatric: She has a normal mood and affect. Her behavior is normal.  Nursing note and vitals reviewed.    ED Treatments / Results  DIAGNOSTIC STUDIES: Oxygen Saturation is 97% on RA, normal by my interpretation.   COORDINATION OF CARE: 10:21 AM- Will prescribe antifungal cream, antibiotic and pain medication. Encouraged pt to follow up with dentist at appt that is already made. Pt verbalizes understanding and agrees to plan.  Medications  oxyCODONE-acetaminophen (PERCOCET/ROXICET) 5-325 MG per tablet 1 tablet (1 tablet Oral Given 01/05/17 0959)    Labs (all labs ordered are listed, but only abnormal results are displayed) Labs Reviewed - No data to display  EKG  EKG Interpretation None       Radiology No results found.  Procedures Procedures (including critical care time)  Medications Ordered in ED Medications  oxyCODONE-acetaminophen (PERCOCET/ROXICET) 5-325 MG per tablet 1 tablet (1 tablet Oral Given 01/05/17 0959)     Initial Impression / Assessment and Plan / ED Course  I have reviewed the  triage vital signs and the nursing notes.  Pertinent labs & imaging results that were available during my care of the patient were reviewed by me and considered in my medical decision making (see chart for details).     Patient with diffuse dentalgia and poor dentition throughout that began bothering her about four days ago. No abscess requiring immediate incision and drainage. Exam not concerning for Ludwig's angina or pharyngeal abscess. Will treat with Amoxicillin. Pt instructed to follow-up with dentist.  Patient also presents with tinea infection to the left buttocks, left upper leg and right breast. Will treat with anti-fungal medication. Pt instructed to keep the area dry. Contact precautions given. No signs of secondary infection. Follow up with PCP in 2-3 days for continued symptoms. Return precautions discussed. Pt is safe for discharge at this time. Discussed return precautions. Pt safe for discharge.     Final Clinical Impressions(s) / ED Diagnoses   Final diagnoses:  Rash  Pain, dental    New Prescriptions New Prescriptions   AMOXICILLIN (AMOXIL) 500 MG CAPSULE    Take 1 capsule (500 mg total) by mouth 3 (three) times daily.   CLOTRIMAZOLE (LOTRIMIN) 1 % CREAM    Apply to affected area 2 times daily   HYDROCODONE-ACETAMINOPHEN (NORCO/VICODIN) 5-325 MG TABLET    Take 2 tablets by mouth every 4 (four) hours as needed.  An After Visit Summary was printed and given to the patient.  I personally performed the services in this documentation, which was scribed in my presence.  The recorded information has been reviewed and considered.   Ronnald Collum.    Hollace Kinnier Dexter, PA-C 01/05/17 [place], MD 01/06/17 [phone]

## 2017-01-05 NOTE — ED Notes (Signed)
Declined W/C at D/C and was escorted to lobby by RN. 

## 2017-01-05 NOTE — ED Triage Notes (Signed)
Pt reports She went to the Health Dept. For STD testing. All results were Neg.Pt also reports rash to upper leg and RT breast.

## 2017-01-05 NOTE — ED Triage Notes (Signed)
Pt is here with c/o dental pain and rash. She states she has itchy rash to buttocks and left upper leg. She went to the health department "and my tests came back negative." she also reports pain to multiple teeth.

## 2017-01-15 ENCOUNTER — Other Ambulatory Visit: Payer: Self-pay | Admitting: Family Medicine

## 2017-01-15 DIAGNOSIS — J441 Chronic obstructive pulmonary disease with (acute) exacerbation: Secondary | ICD-10-CM

## 2017-01-16 ENCOUNTER — Other Ambulatory Visit: Payer: Self-pay | Admitting: Family Medicine

## 2017-01-16 DIAGNOSIS — J441 Chronic obstructive pulmonary disease with (acute) exacerbation: Secondary | ICD-10-CM

## 2017-01-21 MED FILL — LISINOPRIL 20 MG TABLET: 20 | 30 days supply | Qty: 30 | Fill #0

## 2017-01-21 MED FILL — ALBUTEROL 0.083% INHAL SOLN: (2.5 MG/3ML | 7 days supply | Qty: 90 | Fill #0

## 2017-01-21 MED FILL — VENTOLIN HFA 90 MCG INHALER: 108 (90 BAS | 25 days supply | Qty: 18 | Fill #0

## 2017-01-21 MED FILL — !ADVAIR 100/50 DISKUS: 100-50 | 30 days supply | Qty: 60 | Fill #0

## 2017-03-13 ENCOUNTER — Encounter (HOSPITAL_COMMUNITY): Payer: Self-pay | Admitting: Emergency Medicine

## 2017-03-13 ENCOUNTER — Emergency Department (HOSPITAL_COMMUNITY)
Admission: EM | Admit: 2017-03-13 | Discharge: 2017-03-13 | Disposition: A | Discharge status: Home / Self Care | Payer: Self-pay | Attending: Emergency Medicine | Admitting: Emergency Medicine

## 2017-03-13 DIAGNOSIS — Z79899 Other long term (current) drug therapy: Secondary | ICD-10-CM | POA: Insufficient documentation

## 2017-03-13 DIAGNOSIS — Z7982 Long term (current) use of aspirin: Secondary | ICD-10-CM | POA: Insufficient documentation

## 2017-03-13 DIAGNOSIS — I5031 Acute diastolic (congestive) heart failure: Secondary | ICD-10-CM | POA: Insufficient documentation

## 2017-03-13 DIAGNOSIS — I110 Hypertensive heart disease with heart failure: Secondary | ICD-10-CM | POA: Insufficient documentation

## 2017-03-13 DIAGNOSIS — F17210 Nicotine dependence, cigarettes, uncomplicated: Secondary | ICD-10-CM | POA: Insufficient documentation

## 2017-03-13 DIAGNOSIS — Z8673 Personal history of transient ischemic attack (TIA), and cerebral infarction without residual deficits: Secondary | ICD-10-CM | POA: Insufficient documentation

## 2017-03-13 DIAGNOSIS — J449 Chronic obstructive pulmonary disease, unspecified: Secondary | ICD-10-CM | POA: Insufficient documentation

## 2017-03-13 DIAGNOSIS — K0889 Other specified disorders of teeth and supporting structures: Secondary | ICD-10-CM | POA: Insufficient documentation

## 2017-03-13 MED ORDER — ACETAMINOPHEN 500 MG PO TABS
1000.0000 mg | ORAL_TABLET | Freq: Once | ORAL | Status: AC
  Administered 2017-03-13: 1000 mg via ORAL
  Filled 2017-03-13: qty 2

## 2017-03-13 MED ORDER — PENICILLIN V POTASSIUM 250 MG PO TABS: 250.0000 mg | ORAL_TABLET | Freq: Four times a day (QID) | ORAL | 0 refills | Status: AC

## 2017-03-13 MED ORDER — IBUPROFEN 800 MG PO TABS
800.0000 mg | ORAL_TABLET | Freq: Once | ORAL | Status: AC
  Administered 2017-03-13: 800 mg via ORAL
  Filled 2017-03-13: qty 1

## 2017-03-13 MED ORDER — OXYCODONE HCL 5 MG PO TABS
5.0000 mg | ORAL_TABLET | Freq: Once | ORAL | Status: AC
  Administered 2017-03-13: 5 mg via ORAL
  Filled 2017-03-13: qty 1

## 2017-03-13 NOTE — ED Triage Notes (Signed)
Pt sts dental pain x 1 week

## 2017-03-13 NOTE — ED Notes (Signed)
Declined W/C at D/C and was escorted to lobby by RN. 

## 2017-03-13 NOTE — ED Provider Notes (Signed)
[facility] DEPT Provider Note   CSN: [id number] Arrival date & time: 03/13/17  1221  By signing my name below, I, Levester Fresh, attest that this documentation has been prepared under the direction and in the presence of No att. providers found . Electronically Signed: Levester Fresh, Scribe. 03/13/2017. 2:42 PM.   History   Chief Complaint Chief Complaint  Patient presents with  . Dental Pain   HPI Comments Taylor Vazquez is a 51 y.o. female with a PMHx significant for HTN, CHF, depression, obesity and CVA, who presents to the Emergency Department with complaints of worsening generalized dental pain x2 weeks. She specifically reports constant left jaw pain with radiation to ear. Sx rated 10/10 in severity. No oropharyngeal involvement. No trouble swallowing. No facial swelling. Pt additionally endorses headache and low PO intake 2/2 pain. She has attempted to see a dentist has has been unable to get an appointment through [facility]. Pt denies experiencing any other acute sx, including nausea, vomiting, abdominal pain, fever or chills. Pt has experienced no relief with 800mg  Tylenol qd.   The history is provided by the patient and medical records. No language interpreter was used.   Past Medical History:  Diagnosis Date  . Asthma   . CHF (congestive heart failure) ([place])   . Depression   . Hypertension   . Obesity   . Sickle cell trait ([place])   . Stroke [facility])     Patient Active Problem List   Diagnosis Date Noted  . 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 Left-sided weakness 06/14/2015  . COPD exacerbation ([place]) 06/09/2015  . Acute diastolic heart failure ([place]) 06/09/2015  . Shortness of breath 06/08/2015  . Chest pain at rest 06/08/2015  . Chest pain 06/08/2015  . Cocaine abuse 05/19/2014  . Chest wall pain 05/17/2014  . Tobacco use disorder 05/17/2014  . CVA (cerebral infarction) 05/16/2014  . Asthma, chronic 05/16/2014  . Chest discomfort  05/16/2014  . Bug bite 05/16/2014  . Hypertension 04/28/2012    Past Surgical History:  Procedure Laterality Date  . NO PAST SURGERIES      OB History    Gravida Para Term Preterm AB Living   8 5   5 1 4    SAB TAB Ectopic Multiple Live Births     1             Home Medications    Prior to Admission medications   Medication Sig Start Date End Date Taking? Authorizing Provider  acetaminophen (TYLENOL) 325 MG tablet Take 2 tablets (650 mg total) by mouth every 6 (six) hours as needed for mild pain or moderate pain. Patient not taking: Reported on 06/18/2016 06/10/15   Kinnie Feil, MD  acetaminophen (TYLENOL) 500 MG tablet Take 1,000 mg by mouth every 6 (six) hours as needed for moderate pain.    [provider]  ADVAIR DISKUS 100-50 MCG/DOSE AEPB INHALE 1 PUFF INTO THE LUNGS 2 TIMES DAILY. 01/16/17   Arnoldo Morale, MD  albuterol (PROVENTIL) (2.5 MG/3ML) 0.083% nebulizer solution USE 3 MLS BY NEBULIZATION EVERY 6 HOURS AS NEEDED FOR WHEEZING OR SHORTNESS OF BREATH. 01/16/17   Arnoldo Morale, MD  amLODipine (NORVASC) 10 MG tablet Take 1 tablet (10 mg total) by mouth daily. 03/13/16   Brayton Caves, PA-C  amoxicillin (AMOXIL) 500 MG capsule Take 1 capsule (500 mg total) by mouth 3 (three) times daily. 01/05/17   Fransico Meadow, PA-C  aspirin 325 MG tablet  Take 1 tablet (325 mg total) by mouth daily. Patient not taking: Reported on 03/13/2016 09/30/15   Arnoldo Morale, MD  clotrimazole (LOTRIMIN) 1 % cream Apply to affected area 2 times daily 01/05/17   Fransico Meadow, PA-C  HYDROcodone-acetaminophen (NORCO/VICODIN) 5-325 MG tablet Take 2 tablets by mouth every 4 (four) hours as needed. 01/05/17   Fransico Meadow, PA-C  lisinopril (PRINIVIL,ZESTRIL) 20 MG tablet TAKE 1 TABLET BY MOUTH DAILY. 01/16/17   Arnoldo Morale, MD  Menthol, Topical Analgesic, (BENGAY EX) Apply 1 application topically as needed (for pain).    [provider]  oxyCODONE-acetaminophen (PERCOCET/ROXICET)  5-325 MG tablet Take 1 tablet by mouth every 6 (six) hours as needed for severe pain. 06/18/16   Blanchie Dessert, MD  penicillin v potassium (VEETID) 250 MG tablet Take 1 tablet (250 mg total) by mouth 4 (four) times daily. 03/13/17 03/20/17  Deno Etienne, DO  topiramate (TOPAMAX) 50 MG tablet Take 1 tablet (50 mg total) by mouth 2 (two) times daily. Patient taking differently: Take 50 mg by mouth 2 (two) times daily as needed (for headaches).  03/13/16   Brayton Caves, PA-C  VENTOLIN HFA 108 (90 Base) MCG/ACT inhaler INHALE 2 PUFFS INTO THE LUNGS EVERY 6 HOURS AS NEEDED FOR WHEEZING OR SHORTNESS OF BREATH. 01/16/17   Arnoldo Morale, MD  zolpidem (AMBIEN) 5 MG tablet 5 mg  Take 1-2 tablets hs prn insomnia Patient taking differently: Take 5 mg by mouth at bedtime as needed for sleep.  03/13/16   Brayton Caves, PA-C    Family History Family History  Problem Relation Age of Onset  . Hypertension Mother   . Hypertension Father   . Cancer Father   . Hypertension Other   . Sickle cell trait Other   . Diabetes Other   . Other Neg Hx    Social History Social History  Substance Use Topics  . Smoking status: Current Some Day Smoker    Packs/day: 0.25    Types: Cigarettes  . Smokeless tobacco: Never Used  . Alcohol use 0.0 oz/week     Comment: occ     Allergies   Bee venom and Shellfish allergy   Review of Systems Review of Systems  Constitutional: Negative for chills and fever.  HENT: Positive for dental problem. Negative for congestion, facial swelling, rhinorrhea and trouble swallowing.   Eyes: Negative for redness and visual disturbance.  Respiratory: Negative for shortness of breath and wheezing.   Cardiovascular: Negative for chest pain and palpitations.  Gastrointestinal: Negative for nausea and vomiting.  Genitourinary: Negative for dysuria and urgency.  Musculoskeletal: Negative for arthralgias and myalgias.  Skin: Negative for pallor and wound.  Neurological: Positive for  headaches. Negative for dizziness.   Physical Exam Updated Vital Signs BP (!) 181/104 (BP Location: Left Arm)   Pulse 83   Temp 97.8 F (36.6 C) (Oral)   Resp 18   SpO2 96%   Physical Exam  Constitutional: She is oriented to person, place, and time. She appears well-developed and well-nourished. No distress.  HENT:  Head: Normocephalic and atraumatic.  Diffuse carries throughout mouth. Right frontal incisor with eroision of the gum to the root and is loose. Pain along the wisdom teeth to the left lower and left upper jaw. No associated swelling, no trismus. No sublingual swelling.   Eyes: EOM are normal. Pupils are equal, round, and reactive to light.  Neck: Normal range of motion. Neck supple.  Cardiovascular: Normal rate and regular rhythm.  Exam reveals no gallop and no friction rub.   No murmur heard. Pulmonary/Chest: Effort normal. She has no wheezes. She has no rales.  Abdominal: Soft. She exhibits no distension. There is no tenderness.  Musculoskeletal: She exhibits no edema or tenderness.  Neurological: She is alert and oriented to person, place, and time.  Skin: Skin is warm and dry. She is not diaphoretic.  Psychiatric: She has a normal mood and affect. Her behavior is normal.  Nursing note and vitals reviewed.   ED Treatments / Results  DIAGNOSTIC STUDIES: Oxygen Saturation is 96% on RA, adequate by my interpretation.    COORDINATION OF CARE: 2:22 PM Discussed treatment plan with pt at bedside and pt agreed to plan.  Labs (all labs ordered are listed, but only abnormal results are displayed) Labs Reviewed - No data to display  EKG  EKG Interpretation None       Radiology No results found.  Procedures Procedures (including critical care time)  Medications Ordered in ED Medications  acetaminophen (TYLENOL) tablet 1,000 mg (1,000 mg Oral Given 03/13/17 1436)  ibuprofen (ADVIL,MOTRIN) tablet 800 mg (800 mg Oral Given 03/13/17 1436)  oxyCODONE (Oxy  IR/ROXICODONE) immediate release tablet 5 mg (5 mg Oral Given 03/13/17 1436)     Initial Impression / Assessment and Plan / ED Course  I have reviewed the triage vital signs and the nursing notes.  Pertinent labs & imaging results that were available during my care of the patient were reviewed by me and considered in my medical decision making (see chart for details).  Patient with toothache.  No gross abscess.  Exam unconcerning for Ludwig's angina or spread of infection.  Will treat with penicillin and pain medicine.  Gave patient resources to follow-up with dentist.       51 yo F with a chief complaint of diffuse dental pain. Going on for quite some time. Patient having difficulty finding a dentist. Denies fevers. She feels that her pain on her left side is gotten more severe and feels that she's having trouble sleeping with it. Has tried ibuprofen without improvement. On my exam there are no signs of infection. Left TM is normal. Offered a dental block which the patient declined. She requested narcotics which I explained to her are not allowed due to our department policy. She is given the dental referral sheet.   I personally performed the services described in this documentation, which was scribed in my presence. The recorded information has been reviewed and is accurate.  3:01 PM:  I have discussed the diagnosis/risks/treatment options with the patient and believe the pt to be eligible for discharge home to follow-up with Dentist!. We also discussed returning to the ED immediately if new or worsening sx occur. We discussed the sx which are most concerning (e.g., sudden worsening pain, fever, inability to tolerate by mouth) that necessitate immediate return. Medications administered to the patient during their visit and any new prescriptions provided to the patient are listed below.  Medications given during this visit Medications  acetaminophen (TYLENOL) tablet 1,000 mg (1,000 mg Oral  Given 03/13/17 1436)  ibuprofen (ADVIL,MOTRIN) tablet 800 mg (800 mg Oral Given 03/13/17 1436)  oxyCODONE (Oxy IR/ROXICODONE) immediate release tablet 5 mg (5 mg Oral Given 03/13/17 1436)     The patient appears reasonably screen and/or stabilized for discharge and I doubt any other medical condition or other [facility] requiring further screening, evaluation, or treatment in the ED at this time prior to discharge.  Final Clinical Impressions(s) / ED Diagnoses   Final diagnoses:  Pain, dental    New Prescriptions Discharge Medication List as of 03/13/2017  2:31 PM    START taking these medications   Details  penicillin v potassium (VEETID) 250 MG tablet Take 1 tablet (250 mg total) by mouth 4 (four) times daily., Starting Wed 03/13/2017, Until Wed 03/20/2017, [place], Linna Hoff, DO 03/13/17 1501

## 2017-03-13 NOTE — Discharge Instructions (Signed)
Take 4 over the counter ibuprofen tablets 3 times a day or 2 over-the-counter naproxen tablets twice a day for pain. Also take tylenol 1000mg(2 extra strength) four times a day.    

## 2017-03-20 ENCOUNTER — Telehealth: Payer: Self-pay | Admitting: Family Medicine

## 2017-03-20 DIAGNOSIS — K0889 Other specified disorders of teeth and supporting structures: Secondary | ICD-10-CM

## 2017-03-20 NOTE — Telephone Encounter (Signed)
PT called to request a referral for a dentis since she went to the ED on 03/13/17 and she was told that her tooth need to come out, please follow up

## 2017-03-20 NOTE — Telephone Encounter (Signed)
Will route to PCP 

## 2017-03-22 NOTE — Telephone Encounter (Signed)
Referred.

## 2017-04-09 ENCOUNTER — Ambulatory Visit: Payer: Self-pay | Admitting: Family Medicine

## 2017-04-26 ENCOUNTER — Ambulatory Visit: Payer: Self-pay | Attending: Family Medicine | Admitting: Family Medicine

## 2017-04-26 ENCOUNTER — Encounter: Payer: Self-pay | Admitting: Family Medicine

## 2017-04-26 VITALS — BP 137/83 | HR 81 | Temp 98.5°F | Resp 18 | Ht 63.0 in | Wt 237.0 lb

## 2017-04-26 DIAGNOSIS — I10 Essential (primary) hypertension: Secondary | ICD-10-CM

## 2017-04-26 DIAGNOSIS — G43909 Migraine, unspecified, not intractable, without status migrainosus: Secondary | ICD-10-CM | POA: Insufficient documentation

## 2017-04-26 DIAGNOSIS — Z9114 Patient's other noncompliance with medication regimen: Secondary | ICD-10-CM | POA: Insufficient documentation

## 2017-04-26 DIAGNOSIS — E669 Obesity, unspecified: Secondary | ICD-10-CM | POA: Insufficient documentation

## 2017-04-26 DIAGNOSIS — Z0000 Encounter for general adult medical examination without abnormal findings: Secondary | ICD-10-CM

## 2017-04-26 DIAGNOSIS — K219 Gastro-esophageal reflux disease without esophagitis: Secondary | ICD-10-CM

## 2017-04-26 DIAGNOSIS — Z79899 Other long term (current) drug therapy: Secondary | ICD-10-CM | POA: Insufficient documentation

## 2017-04-26 DIAGNOSIS — J438 Other emphysema: Secondary | ICD-10-CM

## 2017-04-26 DIAGNOSIS — Z6841 Body Mass Index (BMI) 40.0 and over, adult: Secondary | ICD-10-CM | POA: Insufficient documentation

## 2017-04-26 DIAGNOSIS — I5032 Chronic diastolic (congestive) heart failure: Secondary | ICD-10-CM

## 2017-04-26 DIAGNOSIS — G43709 Chronic migraine without aura, not intractable, without status migrainosus: Secondary | ICD-10-CM

## 2017-04-26 DIAGNOSIS — D573 Sickle-cell trait: Secondary | ICD-10-CM | POA: Insufficient documentation

## 2017-04-26 DIAGNOSIS — I110 Hypertensive heart disease with heart failure: Secondary | ICD-10-CM | POA: Insufficient documentation

## 2017-04-26 DIAGNOSIS — Z7982 Long term (current) use of aspirin: Secondary | ICD-10-CM | POA: Insufficient documentation

## 2017-04-26 DIAGNOSIS — J449 Chronic obstructive pulmonary disease, unspecified: Secondary | ICD-10-CM | POA: Insufficient documentation

## 2017-04-26 DIAGNOSIS — Z8673 Personal history of transient ischemic attack (TIA), and cerebral infarction without residual deficits: Secondary | ICD-10-CM | POA: Insufficient documentation

## 2017-04-26 DIAGNOSIS — F329 Major depressive disorder, single episode, unspecified: Secondary | ICD-10-CM | POA: Insufficient documentation

## 2017-04-26 DIAGNOSIS — K0889 Other specified disorders of teeth and supporting structures: Secondary | ICD-10-CM

## 2017-04-26 DIAGNOSIS — R7303 Prediabetes: Secondary | ICD-10-CM

## 2017-04-26 LAB — GLUCOSE, POCT (MANUAL RESULT ENTRY): POC Glucose: 108 mg/dl — AB (ref 70–99)

## 2017-04-26 LAB — POCT GLYCOSYLATED HEMOGLOBIN (HGB A1C): HEMOGLOBIN A1C: 5.6

## 2017-04-26 MED ORDER — VENTOLIN HFA 108 (90 BASE) MCG/ACT IN AERS: INHALATION_SPRAY | RESPIRATORY_TRACT | 5 refills | Status: DC

## 2017-04-26 MED ORDER — LOSARTAN POTASSIUM 25 MG PO TABS: 25.0000 mg | ORAL_TABLET | Freq: Every day | ORAL | 6 refills | Status: DC

## 2017-04-26 MED ORDER — FAMOTIDINE 20 MG PO TABS: 20.0000 mg | ORAL_TABLET | Freq: Two times a day (BID) | ORAL | 3 refills | Status: DC

## 2017-04-26 MED ORDER — AMLODIPINE BESYLATE 10 MG PO TABS: 10.0000 mg | ORAL_TABLET | Freq: Every day | ORAL | 5 refills | Status: DC

## 2017-04-26 MED ORDER — FLUTICASONE-SALMETEROL 100-50 MCG/DOSE IN AEPB: INHALATION_SPRAY | RESPIRATORY_TRACT | 3 refills | Status: DC

## 2017-04-26 MED ORDER — ALBUTEROL SULFATE (2.5 MG/3ML) 0.083% IN NEBU: INHALATION_SOLUTION | RESPIRATORY_TRACT | 2 refills | Status: DC

## 2017-04-26 MED ORDER — TOPIRAMATE 50 MG PO TABS: 50.0000 mg | ORAL_TABLET | Freq: Two times a day (BID) | ORAL | 5 refills | Status: DC

## 2017-04-26 MED ORDER — TRAMADOL HCL 50 MG PO TABS: 50.0000 mg | ORAL_TABLET | Freq: Two times a day (BID) | ORAL | 0 refills | Status: DC | PRN

## 2017-04-26 MED ORDER — AMOXICILLIN 500 MG PO CAPS: 500.0000 mg | ORAL_CAPSULE | Freq: Three times a day (TID) | ORAL | 0 refills | Status: DC

## 2017-04-26 MED FILL — !ADVAIR 100/50 DISKUS: 100-50 | 30 days supply | Qty: 60 | Fill #0

## 2017-04-26 MED FILL — AMOXICILLIN 500 MG CAPSULE: 500 | 10 days supply | Qty: 30 | Fill #0

## 2017-04-26 MED FILL — TOPIRAMATE 50 MG TABLET: 50 | 30 days supply | Qty: 60 | Fill #0

## 2017-04-26 MED FILL — LOSARTAN POTASSIUM 25 MG TA: 25 | 30 days supply | Qty: 30 | Fill #0

## 2017-04-26 MED FILL — !VENTOLIN HFA INHALER: 108 (90 BAS | 25 days supply | Qty: 18 | Fill #0

## 2017-04-26 MED FILL — FAMOTIDINE 20 MG TABLET: 20 | 30 days supply | Qty: 60 | Fill #0

## 2017-04-26 MED FILL — ?ALBUTEROL SUL 2.5 MG/3 MLS: (2.5 MG/3ML | 15 days supply | Qty: 180 | Fill #0

## 2017-04-26 MED FILL — ?AMLODIPINE BESYLATE 10 MG: 10 | 30 days supply | Qty: 30 | Fill #0

## 2017-04-26 NOTE — Patient Instructions (Signed)
Dental Pain Dental pain may be caused by many things, including:  Tooth decay (cavities or caries). Cavities cause the nerve of your tooth to be open to air and hot or cold temperatures. This can cause pain or discomfort.  Abscess or infection. A dental abscess is an area that is full of infected pus from a bacterial infection in the inner part of the tooth (pulp). It usually happens at the end of the tooth's root.  Injury.  An unknown reason (idiopathic).  Your pain may be mild or severe. It may only happen when:  You are chewing.  You are exposed to hot or cold temperature.  You are eating or drinking sugary foods or beverages, such as: ? Soda. ? Candy.  Your pain may also be there all of the time. Follow these instructions at home: Watch your dental pain for any changes. Do these things to lessen your discomfort:  Take medicines only as told by your dentist.  If your dentist tells you to take an antibiotic medicine, finish all of it even if you start to feel better.  Keep all follow-up visits as told by your dentist. This is important.  Do not apply heat to the outside of your face.  Rinse your mouth or gargle with salt water if told by your dentist. This helps with pain and swelling. ? You can make salt water by adding  tsp of salt to 1 cup of warm water.  Apply ice to the painful area of your face: ? Put ice in a plastic bag. ? Place a towel between your skin and the bag. ? Leave the ice on for 20 minutes, 2-3 times per day.  Avoid foods or drinks that cause you pain, such as: ? Very hot or very cold foods or drinks. ? Sweet or sugary foods or drinks.  Contact a doctor if:  Your pain is not helped with medicines.  Your symptoms are worse.  You have new symptoms. Get help right away if:  You cannot open your mouth.  You are having trouble breathing or swallowing.  You have a fever.  Your face, neck, or jaw is puffy (swollen). This information is not  intended to replace advice given to you by your health care provider. Make sure you discuss any questions you have with your health care provider. Document Released: 03/26/2008 Document Revised: 03/15/2016 Document Reviewed: 10/04/2014 Elsevier Interactive Patient Education  2018 Elsevier Inc.  

## 2017-04-26 NOTE — Progress Notes (Signed)
Patient is here for DM and dental referral  Patient complains of tooth pain being present and scaled at a 10.  Patient took tylenol in the office. Patient has not eaten today.

## 2017-04-26 NOTE — Progress Notes (Signed)
Subjective:  Patient ID: Taylor Vazquez, female    DOB: May 05, 1966  Age: 51 y.o. MRN: [id number]  CC: Dental Pain   HPI Taylor Vazquez is a 51 year old female with a history of COPD, asthma, diastolic CHF (EF 26-33%), obesity who presents today for a follow-up visit.  She was last seen in the clinic 18 months ago and has not been compliant with any of her medications. She states she had to go care for her dying father and is just coming back to [place]. She is requesting a refill of all her medications.  She complains of cough with lisinopril and would like this to be changed to another antihypertensive. She has orthopnea and has to sleep on her side for relief. Denies chest pain or shortness of breath.  She complains of epigastric pain which is worse after meals and some nausea but no diarrhea or vomiting.  She has had recent ED visits for toothaches and has received antibiotics with no relief in symptoms. She has been unable to see a dentist due to lack of medical coverage. Pain shoots from the left-side of her jaw to her left ear and into her head  Past Medical History:  Diagnosis Date  . Asthma   . CHF (congestive heart failure) ([place])   . Depression   . Hypertension   . Obesity   . Sickle cell trait ([place])   . Stroke [facility])     Past Surgical History:  Procedure Laterality Date  . NO PAST SURGERIES      Allergies  Allergen Reactions  . Bee Venom Anaphylaxis  . Shellfish Allergy Anaphylaxis and Swelling     Outpatient Medications Prior to Visit  Medication Sig Dispense Refill  . acetaminophen (TYLENOL) 325 MG tablet Take 2 tablets (650 mg total) by mouth every 6 (six) hours as needed for mild pain or moderate pain.    Marland Kitchen acetaminophen (TYLENOL) 500 MG tablet Take 1,000 mg by mouth every 6 (six) hours as needed for moderate pain.    Marland Kitchen aspirin 325 MG tablet Take 1 tablet (325 mg total) by mouth daily. 30 tablet 2  . clotrimazole (LOTRIMIN) 1 % cream Apply to  affected area 2 times daily 60 g 0  . Menthol, Topical Analgesic, (BENGAY EX) Apply 1 application topically as needed (for pain).    Marland Kitchen zolpidem (AMBIEN) 5 MG tablet 5 mg  Take 1-2 tablets hs prn insomnia (Patient taking differently: Take 5 mg by mouth at bedtime as needed for sleep. ) 15 tablet 1  . ADVAIR DISKUS 100-50 MCG/DOSE AEPB INHALE 1 PUFF INTO THE LUNGS 2 TIMES DAILY. 60 each 0  . albuterol (PROVENTIL) (2.5 MG/3ML) 0.083% nebulizer solution USE 3 MLS BY NEBULIZATION EVERY 6 HOURS AS NEEDED FOR WHEEZING OR SHORTNESS OF BREATH. 180 mL 2  . amLODipine (NORVASC) 10 MG tablet Take 1 tablet (10 mg total) by mouth daily. 30 tablet 5  . lisinopril (PRINIVIL,ZESTRIL) 20 MG tablet TAKE 1 TABLET BY MOUTH DAILY. 30 tablet 2  . topiramate (TOPAMAX) 50 MG tablet Take 1 tablet (50 mg total) by mouth 2 (two) times daily. (Patient taking differently: Take 50 mg by mouth 2 (two) times daily as needed (for headaches). ) 60 tablet 1  . VENTOLIN HFA 108 (90 Base) MCG/ACT inhaler INHALE 2 PUFFS INTO THE LUNGS EVERY 6 HOURS AS NEEDED FOR WHEEZING OR SHORTNESS OF BREATH. 18 g 0  . amoxicillin (AMOXIL) 500 MG capsule Take 1 capsule (500 mg total) by mouth  3 (three) times daily. 21 capsule 0  . HYDROcodone-acetaminophen (NORCO/VICODIN) 5-325 MG tablet Take 2 tablets by mouth every 4 (four) hours as needed. 10 tablet 0  . oxyCODONE-acetaminophen (PERCOCET/ROXICET) 5-325 MG tablet Take 1 tablet by mouth every 6 (six) hours as needed for severe pain. 10 tablet 0   No facility-administered medications prior to visit.     ROS Review of Systems  Constitutional: Negative for activity change, appetite change and fatigue.  HENT: Positive for dental problem. Negative for congestion, sinus pressure and sore throat.   Eyes: Negative for visual disturbance.  Respiratory: Negative for cough, chest tightness, shortness of breath and wheezing.   Cardiovascular: Negative for chest pain and palpitations.  Gastrointestinal:  Positive for abdominal pain. Negative for abdominal distention and constipation.  Endocrine: Negative for polydipsia.  Genitourinary: Negative for dysuria and frequency.  Musculoskeletal: Negative for arthralgias and back pain.  Skin: Negative for rash.  Neurological: Negative for tremors, light-headedness and numbness.  Hematological: Does not bruise/bleed easily.  Psychiatric/Behavioral: Negative for agitation and behavioral problems.    Objective:  BP 137/83 (BP Location: Left Arm, Patient Position: Sitting, Cuff Size: Large)   Pulse 81   Temp 98.5 F (36.9 C) (Oral)   Resp 18   Ht '5\' 3"'  (1.6 m)   Wt 237 lb (107.5 kg)   SpO2 99%   BMI 41.98 kg/m   BP/Weight 04/26/2017 03/13/2017 05/06/9677  Systolic BP 938 101 751  Diastolic BP 83 025 92  Wt. (Lbs) 237 - -  BMI 41.98 - -   Wt Readings from Last 3 Encounters:  04/26/17 237 lb (107.5 kg)  03/13/16 225 lb 9.6 oz (102.3 kg)  09/30/15 233 lb (105.7 kg)       Physical Exam  Constitutional: She is oriented to person, place, and time. She appears well-developed and well-nourished.  HENT:  Poor dentition, multiple cracked teeth, no abscess  Neck: No JVD present.  Cardiovascular: Normal rate, normal heart sounds and intact distal pulses.   No murmur heard. Pulmonary/Chest: Effort normal and breath sounds normal. She has no wheezes. She has no rales. She exhibits no tenderness.  Abdominal: Soft. Bowel sounds are normal. She exhibits no distension and no mass. There is no tenderness.  Musculoskeletal: Normal range of motion.  Neurological: She is alert and oriented to person, place, and time.  Skin: Skin is warm and dry.  Psychiatric: She has a normal mood and affect.     Lab Results  Component Value Date   HGBA1C 5.6 04/26/2017    Assessment & Plan:   1. Prediabetes Diet controlled with A1c of 5.6 - POCT A1C - Glucose (CBG)  2. Other emphysema (HCC) Stable - Fluticasone-Salmeterol (ADVAIR DISKUS) 100-50 MCG/DOSE  AEPB; INHALE 1 PUFF INTO THE LUNGS 2 TIMES DAILY.  Dispense: 60 each; Refill: 3 - albuterol (PROVENTIL) (2.5 MG/3ML) 0.083% nebulizer solution; USE 3 MLS BY NEBULIZATION EVERY 6 HOURS AS NEEDED FOR WHEEZING OR SHORTNESS OF BREATH.  Dispense: 180 mL; Refill: 2 - VENTOLIN HFA 108 (90 Base) MCG/ACT inhaler; INHALE 2 PUFFS INTO THE LUNGS EVERY 6 HOURS AS NEEDED FOR WHEEZING OR SHORTNESS OF BREATH.  Dispense: 18 g; Refill: 5  3. Essential hypertension Controlled Switch from lisinopril to losartan due to complain of cough - amLODipine (NORVASC) 10 MG tablet; Take 1 tablet (10 mg total) by mouth daily.  Dispense: 30 tablet; Refill: 5 - losartan (COZAAR) 25 MG tablet; Take 1 tablet (25 mg total) by mouth daily.  Dispense: 30 tablet;  Refill: 6 - CMP14+EGFR - Lipid panel  4. Chronic diastolic heart failure (HCC) EF of 55-60% from 2-D echo of 05/2015 Euvolemic Will consider 2-D echo at next visit.  5. Healthcare maintenance She is due for Pap smear and mammogram Advised to apply for financial discount possibility of referral for mammogram  6. Toothache She will need to see a dentist for multiple teeth extraction however she would need the [place] discomfort prior to this. - amoxicillin (AMOXIL) 500 MG capsule; Take 1 capsule (500 mg total) by mouth 3 (three) times daily.  Dispense: 30 capsule; Refill: 0 - traMADol (ULTRAM) 50 MG tablet; Take 1 tablet (50 mg total) by mouth every 12 (twelve) hours as needed.  Dispense: 40 tablet; Refill: 0  7. Chronic migraine without aura without status migrainosus, not intractable Controlled - topiramate (TOPAMAX) 50 MG tablet; Take 1 tablet (50 mg total) by mouth 2 (two) times daily.  Dispense: 60 tablet; Refill: 5  8. GERD Placed on famotidine Meds ordered this encounter  Medications  . Fluticasone-Salmeterol (ADVAIR DISKUS) 100-50 MCG/DOSE AEPB    Sig: INHALE 1 PUFF INTO THE LUNGS 2 TIMES DAILY.    Dispense:  60 each    Refill:  3    Must have  office visit for refills  . albuterol (PROVENTIL) (2.5 MG/3ML) 0.083% nebulizer solution    Sig: USE 3 MLS BY NEBULIZATION EVERY 6 HOURS AS NEEDED FOR WHEEZING OR SHORTNESS OF BREATH.    Dispense:  180 mL    Refill:  2  . VENTOLIN HFA 108 (90 Base) MCG/ACT inhaler    Sig: INHALE 2 PUFFS INTO THE LUNGS EVERY 6 HOURS AS NEEDED FOR WHEEZING OR SHORTNESS OF BREATH.    Dispense:  18 g    Refill:  5  . amLODipine (NORVASC) 10 MG tablet    Sig: Take 1 tablet (10 mg total) by mouth daily.    Dispense:  30 tablet    Refill:  5  . topiramate (TOPAMAX) 50 MG tablet    Sig: Take 1 tablet (50 mg total) by mouth 2 (two) times daily.    Dispense:  60 tablet    Refill:  5  . losartan (COZAAR) 25 MG tablet    Sig: Take 1 tablet (25 mg total) by mouth daily.    Dispense:  30 tablet    Refill:  6    Discontinue lisinopril  . amoxicillin (AMOXIL) 500 MG capsule    Sig: Take 1 capsule (500 mg total) by mouth 3 (three) times daily.    Dispense:  30 capsule    Refill:  0  . traMADol (ULTRAM) 50 MG tablet    Sig: Take 1 tablet (50 mg total) by mouth every 12 (twelve) hours as needed.    Dispense:  40 tablet    Refill:  0    Follow-up: Return in about 1 month (around 05/27/2017) for Follow-up on congestive heart failure.   Arnoldo Morale MD

## 2017-04-27 LAB — CMP14+EGFR
A/G RATIO: 1.2 (ref 1.2–2.2)
ALBUMIN: 3.8 g/dL (ref 3.5–5.5)
ALT: 19 IU/L (ref 0–32)
AST: 15 IU/L (ref 0–40)
Alkaline Phosphatase: 115 IU/L (ref 39–117)
BILIRUBIN TOTAL: 0.3 mg/dL (ref 0.0–1.2)
BUN / CREAT RATIO: 12 (ref 9–23)
BUN: 11 mg/dL (ref 6–24)
CALCIUM: 9.4 mg/dL (ref 8.7–10.2)
CHLORIDE: 103 mmol/L (ref 96–106)
CO2: 23 mmol/L (ref 20–29)
Creatinine, Ser: 0.89 mg/dL (ref 0.57–1.00)
GFR, EST AFRICAN AMERICAN: 88 mL/min/{1.73_m2} (ref >59)
GFR, EST NON AFRICAN AMERICAN: 76 mL/min/{1.73_m2} (ref >59)
GLUCOSE: 98 mg/dL (ref 65–99)
Globulin, Total: 3.3 g/dL (ref 1.5–4.5)
Potassium: 4.4 mmol/L (ref 3.5–5.2)
Sodium: 143 mmol/L (ref 134–144)
TOTAL PROTEIN: 7.1 g/dL (ref 6.0–8.5)

## 2017-04-27 LAB — LIPID PANEL
CHOL/HDL RATIO: 4 ratio (ref 0.0–4.4)
Cholesterol, Total: 182 mg/dL (ref 100–199)
HDL: 46 mg/dL (ref >39)
LDL CALC: 117 mg/dL — AB (ref 0–99)
Triglycerides: 95 mg/dL (ref 0–149)
VLDL CHOLESTEROL CAL: 19 mg/dL (ref 5–40)

## 2017-04-29 ENCOUNTER — Other Ambulatory Visit: Payer: Self-pay | Admitting: Family Medicine

## 2017-04-29 DIAGNOSIS — E785 Hyperlipidemia, unspecified: Secondary | ICD-10-CM | POA: Insufficient documentation

## 2017-04-29 MED ORDER — ATORVASTATIN CALCIUM 20 MG PO TABS
20.0000 mg | ORAL_TABLET | Freq: Every day | ORAL | 3 refills | Status: DC
Start: 2017-04-29 — End: 2017-05-01

## 2017-05-01 ENCOUNTER — Telehealth: Payer: Self-pay

## 2017-05-01 ENCOUNTER — Other Ambulatory Visit: Payer: Self-pay

## 2017-05-01 DIAGNOSIS — E785 Hyperlipidemia, unspecified: Secondary | ICD-10-CM

## 2017-05-01 MED ORDER — ATORVASTATIN CALCIUM 20 MG PO TABS: 20.0000 mg | ORAL_TABLET | Freq: Every day | ORAL | 3 refills | Status: DC

## 2017-05-01 MED FILL — ATORVASTATIN 20 MG TABLET: 20 | 90 days supply | Qty: 90 | Fill #0

## 2017-05-01 NOTE — Telephone Encounter (Signed)
Pt was called and informed of lab results. P[t medication was sent to onsite pharmacy.

## 2017-06-03 MED FILL — FAMOTIDINE 20 MG TABLET: 20 | 30 days supply | Qty: 60 | Fill #1

## 2017-06-03 MED FILL — LOSARTAN POTASSIUM 25 MG TA: 25 | 30 days supply | Qty: 30 | Fill #1

## 2017-06-03 MED FILL — TOPIRAMATE 50 MG TABLET: 50 | 30 days supply | Qty: 60 | Fill #1

## 2017-06-03 MED FILL — AMLODIPINE BESYLATE 10 MG T: 10 | 30 days supply | Qty: 30 | Fill #1

## 2017-06-04 ENCOUNTER — Ambulatory Visit: Payer: Self-pay | Admitting: Family Medicine

## 2017-06-17 ENCOUNTER — Ambulatory Visit: Payer: Self-pay | Attending: Family Medicine | Admitting: Family Medicine

## 2017-06-17 ENCOUNTER — Encounter: Payer: Self-pay | Admitting: Family Medicine

## 2017-06-17 VITALS — BP 108/75 | HR 97 | Temp 98.2°F | Ht 63.0 in | Wt 237.0 lb

## 2017-06-17 DIAGNOSIS — G4709 Other insomnia: Secondary | ICD-10-CM

## 2017-06-17 DIAGNOSIS — Z79891 Long term (current) use of opiate analgesic: Secondary | ICD-10-CM | POA: Insufficient documentation

## 2017-06-17 DIAGNOSIS — Z8673 Personal history of transient ischemic attack (TIA), and cerebral infarction without residual deficits: Secondary | ICD-10-CM | POA: Insufficient documentation

## 2017-06-17 DIAGNOSIS — I110 Hypertensive heart disease with heart failure: Secondary | ICD-10-CM | POA: Insufficient documentation

## 2017-06-17 DIAGNOSIS — J449 Chronic obstructive pulmonary disease, unspecified: Secondary | ICD-10-CM | POA: Insufficient documentation

## 2017-06-17 DIAGNOSIS — G629 Polyneuropathy, unspecified: Secondary | ICD-10-CM | POA: Insufficient documentation

## 2017-06-17 DIAGNOSIS — F17200 Nicotine dependence, unspecified, uncomplicated: Secondary | ICD-10-CM

## 2017-06-17 DIAGNOSIS — G4700 Insomnia, unspecified: Secondary | ICD-10-CM | POA: Insufficient documentation

## 2017-06-17 DIAGNOSIS — I5032 Chronic diastolic (congestive) heart failure: Secondary | ICD-10-CM | POA: Insufficient documentation

## 2017-06-17 DIAGNOSIS — J441 Chronic obstructive pulmonary disease with (acute) exacerbation: Secondary | ICD-10-CM | POA: Insufficient documentation

## 2017-06-17 DIAGNOSIS — Z7982 Long term (current) use of aspirin: Secondary | ICD-10-CM | POA: Insufficient documentation

## 2017-06-17 DIAGNOSIS — F329 Major depressive disorder, single episode, unspecified: Secondary | ICD-10-CM | POA: Insufficient documentation

## 2017-06-17 DIAGNOSIS — D573 Sickle-cell trait: Secondary | ICD-10-CM | POA: Insufficient documentation

## 2017-06-17 DIAGNOSIS — Z720 Tobacco use: Secondary | ICD-10-CM | POA: Insufficient documentation

## 2017-06-17 DIAGNOSIS — Z79899 Other long term (current) drug therapy: Secondary | ICD-10-CM | POA: Insufficient documentation

## 2017-06-17 MED ORDER — ZOLPIDEM TARTRATE 5 MG PO TABS: 5.0000 mg | ORAL_TABLET | Freq: Every evening | ORAL | 1 refills | Status: DC | PRN

## 2017-06-17 MED ORDER — GABAPENTIN 300 MG PO CAPS: 300.0000 mg | ORAL_CAPSULE | Freq: Two times a day (BID) | ORAL | 3 refills | Status: DC

## 2017-06-17 NOTE — Progress Notes (Signed)
Pt is having numbness in feet and legs.

## 2017-06-17 NOTE — Progress Notes (Signed)
Subjective:  Patient ID: Taylor Vazquez, female    DOB: 07-Oct-1966  Age: 51 y.o. MRN: [id number]  CC: Congestive Heart Failure   HPI Taylor Vazquez is a 51 year-old female with a history of COPD, asthma, diastolic CHF (EF 74-25%), obesity who presents today for a follow-up visit.  She had complained of shortness of breath at her last visit and cough and Lisinopril had been discontinued; she reports improvement of her cough after that but states the residual cough she has is from smoking. She uses her Advair and rescue inhalers.  She complains of numbness in her feet but none in her hands. She has no known history of Diabetes, last A1c of 5.6.  She does have insomnia which is controlled on Ambien and she is requesting a refill.  Past Medical History:  Diagnosis Date  . Asthma   . CHF (congestive heart failure) ([employer])   . Depression   . Hypertension   . Obesity   . Sickle cell trait ([place])   . Stroke [facility])     Past Surgical History:  Procedure Laterality Date  . NO PAST SURGERIES      Allergies  Allergen Reactions  . Bee Venom Anaphylaxis  . Shellfish Allergy Anaphylaxis and Swelling     Outpatient Medications Prior to Visit  Medication Sig Dispense Refill  . acetaminophen (TYLENOL) 325 MG tablet Take 2 tablets (650 mg total) by mouth every 6 (six) hours as needed for mild pain or moderate pain.    Marland Kitchen acetaminophen (TYLENOL) 500 MG tablet Take 1,000 mg by mouth every 6 (six) hours as needed for moderate pain.    Marland Kitchen amLODipine (NORVASC) 10 MG tablet Take 1 tablet (10 mg total) by mouth daily. 30 tablet 5  . famotidine (PEPCID) 20 MG tablet Take 1 tablet (20 mg total) by mouth 2 (two) times daily. 60 tablet 3  . Fluticasone-Salmeterol (ADVAIR DISKUS) 100-50 MCG/DOSE AEPB INHALE 1 PUFF INTO THE LUNGS 2 TIMES DAILY. 60 each 3  . losartan (COZAAR) 25 MG tablet Take 1 tablet (25 mg total) by mouth daily. 30 tablet 6  . topiramate (TOPAMAX) 50 MG tablet Take 1 tablet (50  mg total) by mouth 2 (two) times daily. 60 tablet 5  . VENTOLIN HFA 108 (90 Base) MCG/ACT inhaler INHALE 2 PUFFS INTO THE LUNGS EVERY 6 HOURS AS NEEDED FOR WHEEZING OR SHORTNESS OF BREATH. 18 g 5  . albuterol (PROVENTIL) (2.5 MG/3ML) 0.083% nebulizer solution USE 3 MLS BY NEBULIZATION EVERY 6 HOURS AS NEEDED FOR WHEEZING OR SHORTNESS OF BREATH. 180 mL 2  . zolpidem (AMBIEN) 5 MG tablet 5 mg  Take 1-2 tablets hs prn insomnia (Patient taking differently: Take 5 mg by mouth at bedtime as needed for sleep. ) 15 tablet 1  . aspirin 325 MG tablet Take 1 tablet (325 mg total) by mouth daily. (Patient not taking: Reported on 06/17/2017) 30 tablet 2  . atorvastatin (LIPITOR) 20 MG tablet Take 1 tablet (20 mg total) by mouth daily. (Patient not taking: Reported on 06/17/2017) 90 tablet 3  . clotrimazole (LOTRIMIN) 1 % cream Apply to affected area 2 times daily (Patient not taking: Reported on 06/17/2017) 60 g 0  . Menthol, Topical Analgesic, (BENGAY EX) Apply 1 application topically as needed (for pain).    . traMADol (ULTRAM) 50 MG tablet Take 1 tablet (50 mg total) by mouth every 12 (twelve) hours as needed. (Patient not taking: Reported on 06/17/2017) 40 tablet 0  . amoxicillin (AMOXIL)  500 MG capsule Take 1 capsule (500 mg total) by mouth 3 (three) times daily. (Patient not taking: Reported on 06/17/2017) 30 capsule 0   No facility-administered medications prior to visit.     ROS Review of Systems  Constitutional: Negative for activity change, appetite change and fatigue.  HENT: Negative for congestion, sinus pressure and sore throat.   Eyes: Negative for visual disturbance.  Respiratory: Positive for shortness of breath. Negative for cough, chest tightness and wheezing.   Cardiovascular: Negative for chest pain and palpitations.  Gastrointestinal: Negative for abdominal distention, abdominal pain and constipation.  Endocrine: Negative for polydipsia.  Genitourinary: Negative for dysuria and frequency.    Musculoskeletal: Negative for arthralgias and back pain.  Skin: Negative for rash.  Neurological: Positive for numbness. Negative for tremors and light-headedness.  Hematological: Does not bruise/bleed easily.  Psychiatric/Behavioral: Negative for agitation and behavioral problems.    Objective:  BP 108/75   Pulse 97   Temp 98.2 F (36.8 C) (Oral)   Ht 5\' 3"  (1.6 m)   Wt 237 lb (107.5 kg)   SpO2 97%   BMI 41.98 kg/m   BP/Weight 06/17/2017 04/26/2017 3/50/0938  Systolic BP 182 993 716  Diastolic BP 75 83 967  Wt. (Lbs) 237 237 -  BMI 41.98 41.98 -      Physical Exam  Constitutional: She is oriented to person, place, and time. She appears well-developed and well-nourished.  Cardiovascular: Normal rate, normal heart sounds and intact distal pulses.   No murmur heard. Pulmonary/Chest: Effort normal and breath sounds normal. She has no wheezes. She has no rales. She exhibits no tenderness.  Abdominal: Soft. Bowel sounds are normal. She exhibits no distension and no mass. There is no tenderness.  Musculoskeletal: Normal range of motion.  Neurological: She is alert and oriented to person, place, and time.  Skin: Skin is warm and dry.  Psychiatric: She has a normal mood and affect.     Assessment & Plan:   1. Chronic diastolic heart failure (HCC) EF 55-60% Euvolemic Continue Medications - ECHOCARDIOGRAM COMPLETE; Future  2. COPD exacerbation (HCC) No acute exacerbation  3. Tobacco use disorder Spent 3 minutes counseling on cessation  4. Other insomnia Stable - zolpidem (AMBIEN) 5 MG tablet; Take 1 tablet (5 mg total) by mouth at bedtime as needed for sleep.  Dispense: 30 tablet; Refill: 1  5. Neuropathy - gabapentin (NEURONTIN) 300 MG capsule; Take 1 capsule (300 mg total) by mouth 2 (two) times daily.  Dispense: 60 capsule; Refill: 3   Meds ordered this encounter  Medications  . zolpidem (AMBIEN) 5 MG tablet    Sig: Take 1 tablet (5 mg total) by mouth at  bedtime as needed for sleep.    Dispense:  30 tablet    Refill:  1  . gabapentin (NEURONTIN) 300 MG capsule    Sig: Take 1 capsule (300 mg total) by mouth 2 (two) times daily.    Dispense:  60 capsule    Refill:  3    Follow-up: Return in about 3 months (around 09/17/2017) for Follow-up of CHF.   Arnoldo Morale MD

## 2017-06-25 ENCOUNTER — Ambulatory Visit (HOSPITAL_COMMUNITY)
Admission: RE | Admit: 2017-06-25 | Discharge: 2017-06-25 | Disposition: A | Discharge status: Home / Self Care | Payer: Self-pay | Source: Ambulatory Visit | Attending: Family Medicine | Admitting: Family Medicine

## 2017-06-25 DIAGNOSIS — E669 Obesity, unspecified: Secondary | ICD-10-CM | POA: Insufficient documentation

## 2017-06-25 DIAGNOSIS — I517 Cardiomegaly: Secondary | ICD-10-CM | POA: Insufficient documentation

## 2017-06-25 DIAGNOSIS — Z6841 Body Mass Index (BMI) 40.0 and over, adult: Secondary | ICD-10-CM | POA: Insufficient documentation

## 2017-06-25 DIAGNOSIS — I5032 Chronic diastolic (congestive) heart failure: Secondary | ICD-10-CM | POA: Insufficient documentation

## 2017-06-25 DIAGNOSIS — Z720 Tobacco use: Secondary | ICD-10-CM | POA: Insufficient documentation

## 2017-06-25 DIAGNOSIS — I5189 Other ill-defined heart diseases: Secondary | ICD-10-CM | POA: Insufficient documentation

## 2017-06-25 DIAGNOSIS — J449 Chronic obstructive pulmonary disease, unspecified: Secondary | ICD-10-CM | POA: Insufficient documentation

## 2017-06-25 NOTE — Progress Notes (Signed)
  Echocardiogram 2D Echocardiogram has been performed.  Taylor Vazquez 06/25/2017, 3:29 PM

## 2017-06-26 ENCOUNTER — Telehealth: Payer: Self-pay

## 2017-06-26 NOTE — Telephone Encounter (Signed)
Pt was called and a vm was left informing pt to return phone call for lab results.

## 2017-07-08 MED FILL — traMADol HCL 50 MG TABS: 50 | 20 days supply | Qty: 40 | Fill #0

## 2017-07-08 MED FILL — AMLODIPINE BESYLATE 10 MG T: 10 | 30 days supply | Qty: 30 | Fill #2

## 2017-07-08 MED FILL — !VENTOLIN HFA INHALER: 108 (90 BAS | 25 days supply | Qty: 18 | Fill #1

## 2017-07-08 MED FILL — LOSARTAN POTASSIUM 25 MG TA: 25 | 30 days supply | Qty: 30 | Fill #2

## 2017-08-29 ENCOUNTER — Ambulatory Visit: Payer: Self-pay

## 2017-09-10 ENCOUNTER — Other Ambulatory Visit: Payer: Self-pay | Admitting: Family Medicine

## 2017-09-10 DIAGNOSIS — K0889 Other specified disorders of teeth and supporting structures: Secondary | ICD-10-CM

## 2017-09-10 MED FILL — !ADVAIR 100/50 DISKUS: 100-50 | 30 days supply | Qty: 60 | Fill #1

## 2017-09-10 MED FILL — TOPIRAMATE 50 MG TABLET: 50 | 30 days supply | Qty: 60 | Fill #2

## 2017-09-10 MED FILL — FAMOTIDINE 20 MG TABLET: 20 | 30 days supply | Qty: 60 | Fill #2

## 2017-09-10 MED FILL — ?ATORVASTATIN 20 MG TABLET: 20 | 30 days supply | Qty: 30 | Fill #1

## 2017-09-10 MED FILL — AMLODIPINE BESYLATE 10 MG T: 10 | 30 days supply | Qty: 30 | Fill #3

## 2017-09-10 MED FILL — ALBUTEROL 0.083% INHAL SOLN: (2.5 MG/3ML | 15 days supply | Qty: 180 | Fill #1

## 2017-09-10 MED FILL — LOSARTAN POTASSIUM 25 MG TA: 25 | 30 days supply | Qty: 30 | Fill #3

## 2017-09-17 ENCOUNTER — Ambulatory Visit: Payer: Self-pay | Attending: Family Medicine | Admitting: Family Medicine

## 2017-09-17 ENCOUNTER — Encounter: Payer: Self-pay | Admitting: Family Medicine

## 2017-09-17 VITALS — BP 144/91 | HR 82 | Temp 97.8°F | Ht 63.0 in | Wt 241.2 lb

## 2017-09-17 DIAGNOSIS — M545 Low back pain, unspecified: Secondary | ICD-10-CM

## 2017-09-17 DIAGNOSIS — N921 Excessive and frequent menstruation with irregular cycle: Secondary | ICD-10-CM

## 2017-09-17 DIAGNOSIS — G629 Polyneuropathy, unspecified: Secondary | ICD-10-CM

## 2017-09-17 DIAGNOSIS — N393 Stress incontinence (female) (male): Secondary | ICD-10-CM

## 2017-09-17 DIAGNOSIS — R1011 Right upper quadrant pain: Secondary | ICD-10-CM

## 2017-09-17 DIAGNOSIS — G8929 Other chronic pain: Secondary | ICD-10-CM

## 2017-09-17 DIAGNOSIS — I5032 Chronic diastolic (congestive) heart failure: Secondary | ICD-10-CM

## 2017-09-17 DIAGNOSIS — G4709 Other insomnia: Secondary | ICD-10-CM

## 2017-09-17 DIAGNOSIS — I10 Essential (primary) hypertension: Secondary | ICD-10-CM

## 2017-09-17 DIAGNOSIS — M7989 Other specified soft tissue disorders: Secondary | ICD-10-CM | POA: Insufficient documentation

## 2017-09-17 DIAGNOSIS — E785 Hyperlipidemia, unspecified: Secondary | ICD-10-CM

## 2017-09-17 DIAGNOSIS — J438 Other emphysema: Secondary | ICD-10-CM

## 2017-09-17 DIAGNOSIS — G43709 Chronic migraine without aura, not intractable, without status migrainosus: Secondary | ICD-10-CM

## 2017-09-17 MED ORDER — GABAPENTIN 300 MG PO CAPS: 300.0000 mg | ORAL_CAPSULE | Freq: Two times a day (BID) | ORAL | 3 refills | Status: DC

## 2017-09-17 MED ORDER — LOSARTAN POTASSIUM 25 MG PO TABS: 25.0000 mg | ORAL_TABLET | Freq: Every day | ORAL | 6 refills | Status: DC

## 2017-09-17 MED ORDER — ZOLPIDEM TARTRATE 5 MG PO TABS: 5.0000 mg | ORAL_TABLET | Freq: Every evening | ORAL | 1 refills | Status: DC | PRN

## 2017-09-17 MED ORDER — TOPIRAMATE 50 MG PO TABS: 50.0000 mg | ORAL_TABLET | Freq: Two times a day (BID) | ORAL | 5 refills | Status: DC

## 2017-09-17 MED ORDER — TRAMADOL HCL 50 MG PO TABS: 50.0000 mg | ORAL_TABLET | Freq: Two times a day (BID) | ORAL | 0 refills | Status: DC | PRN

## 2017-09-17 MED ORDER — AMLODIPINE BESYLATE 10 MG PO TABS: 10.0000 mg | ORAL_TABLET | Freq: Every day | ORAL | 5 refills | Status: DC

## 2017-09-17 MED ORDER — ALBUTEROL SULFATE HFA 108 (90 BASE) MCG/ACT IN AERS: 2.0000 | INHALATION_SPRAY | Freq: Four times a day (QID) | RESPIRATORY_TRACT | 2 refills | Status: DC | PRN

## 2017-09-17 MED ORDER — OXYBUTYNIN CHLORIDE 5 MG PO TABS: 5.0000 mg | ORAL_TABLET | Freq: Two times a day (BID) | ORAL | 3 refills | Status: DC

## 2017-09-17 MED ORDER — ATORVASTATIN CALCIUM 20 MG PO TABS: 20.0000 mg | ORAL_TABLET | Freq: Every day | ORAL | 5 refills | Status: DC

## 2017-09-17 MED ORDER — FAMOTIDINE 20 MG PO TABS: 20.0000 mg | ORAL_TABLET | Freq: Two times a day (BID) | ORAL | 3 refills | Status: DC

## 2017-09-17 MED FILL — GABAPENTIN 300 MG CAPSULE: 300 | 30 days supply | Qty: 60 | Fill #0

## 2017-09-17 MED FILL — traMADol HCL 50 MG TABS: 50 | 20 days supply | Qty: 40 | Fill #0

## 2017-09-17 MED FILL — OXYBUTYNIN 5 MG TABLET: 5 | 30 days supply | Qty: 60 | Fill #0

## 2017-09-17 MED FILL — !VENTOLIN HFA INHALER: 108 (90 BAS | 25 days supply | Qty: 18 | Fill #0

## 2017-09-17 NOTE — Patient Instructions (Signed)

## 2017-09-17 NOTE — Progress Notes (Signed)
Subjective:  Patient ID: Taylor Vazquez, female    DOB: 1966/02/21  Age: 51 y.o. MRN: [id number]  CC: Congestive Heart Failure   HPI [name] is a 51 year-old female with a history of COPD, asthma, diastolic CHF (EF 27-06%), obesity who presents today for a follow-up visit.  She has intermittent low back pain which radiates to her legs sometimes and responds to the use of tramadol which she is requesting a refill of.  She received 40 pills 4 months ago which lasted up until now. Pain is absent at this time and she denies recent falls, loss of sphincteric function.  She takes Ambien for insomnia but complains of multiple nighttime awakenings and she endorses a history of snoring but no daytime headaches or somnolence.  She has never been tested for sleep apnea.  She complains of bleeding for the last 1 month and is wondering if she is going through the change.  Prior to this she had her last menstrual period on 05/30/17. Her periods are associated with fatigue and weakness.  She complains of leakage of urine whenever she coughs or strains but denies dysuria, urinary frequency. Also complains of right upper quadrant pain and swelling which she noticed last week but is absent now.  Unsure if symptoms are exacerbated by fatty foods.  Denies nausea, vomiting, diarrhea.  Denies shortness of breath, chest pains and no recent COPD exacerbations.  Past Medical History:  Diagnosis Date  . Asthma   . CHF (congestive heart failure) ([place])   . Depression   . Hypertension   . Obesity   . Sickle cell trait ([place])   . Stroke [facility])     Past Surgical History:  Procedure Laterality Date  . NO PAST SURGERIES      Allergies  Allergen Reactions  . Bee Venom Anaphylaxis  . Shellfish Allergy Anaphylaxis and Swelling    Outpatient Medications Prior to Visit  Medication Sig Dispense Refill  . acetaminophen (TYLENOL) 500 MG tablet Take 1,000 mg by mouth every 6 (six) hours as needed  for moderate pain.    Marland Kitchen Fluticasone-Salmeterol (ADVAIR DISKUS) 100-50 MCG/DOSE AEPB INHALE 1 PUFF INTO THE LUNGS 2 TIMES DAILY. 60 each 3  . Menthol, Topical Analgesic, (BENGAY EX) Apply 1 application topically as needed (for pain).    Marland Kitchen amLODipine (NORVASC) 10 MG tablet Take 1 tablet (10 mg total) by mouth daily. 30 tablet 5  . atorvastatin (LIPITOR) 20 MG tablet Take 1 tablet (20 mg total) by mouth daily. 90 tablet 3  . famotidine (PEPCID) 20 MG tablet Take 1 tablet (20 mg total) by mouth 2 (two) times daily. 60 tablet 3  . gabapentin (NEURONTIN) 300 MG capsule Take 1 capsule (300 mg total) by mouth 2 (two) times daily. 60 capsule 3  . losartan (COZAAR) 25 MG tablet Take 1 tablet (25 mg total) by mouth daily. 30 tablet 6  . topiramate (TOPAMAX) 50 MG tablet Take 1 tablet (50 mg total) by mouth 2 (two) times daily. 60 tablet 5  . VENTOLIN HFA 108 (90 Base) MCG/ACT inhaler INHALE 2 PUFFS INTO THE LUNGS EVERY 6 HOURS AS NEEDED FOR WHEEZING OR SHORTNESS OF BREATH. 18 g 5  . zolpidem (AMBIEN) 5 MG tablet Take 1 tablet (5 mg total) by mouth at bedtime as needed for sleep. 30 tablet 1  . acetaminophen (TYLENOL) 325 MG tablet Take 2 tablets (650 mg total) by mouth every 6 (six) hours as needed for mild pain or moderate pain. (  Patient not taking: Reported on 09/17/2017)    . aspirin 325 MG tablet Take 1 tablet (325 mg total) by mouth daily. (Patient not taking: Reported on 06/17/2017) 30 tablet 2  . clotrimazole (LOTRIMIN) 1 % cream Apply to affected area 2 times daily (Patient not taking: Reported on 06/17/2017) 60 g 0  . traMADol (ULTRAM) 50 MG tablet Take 1 tablet (50 mg total) by mouth every 12 (twelve) hours as needed. (Patient not taking: Reported on 06/17/2017) 40 tablet 0   No facility-administered medications prior to visit.     ROS Review of Systems  Constitutional: Negative for activity change, appetite change and fatigue.  HENT: Negative for congestion, sinus pressure and sore throat.     Eyes: Negative for visual disturbance.  Respiratory: Negative for cough, chest tightness, shortness of breath and wheezing.   Cardiovascular: Negative for chest pain and palpitations.  Gastrointestinal: Positive for abdominal pain. Negative for abdominal distention and constipation.  Endocrine: Negative for polydipsia.  Genitourinary: Positive for menstrual problem. Negative for dysuria and frequency.  Musculoskeletal: Positive for back pain. Negative for arthralgias.  Skin: Negative for rash.  Neurological: Negative for tremors, light-headedness and numbness.  Hematological: Does not bruise/bleed easily.  Psychiatric/Behavioral: Negative for agitation and behavioral problems.    Objective:  BP (!) 144/91   Pulse 82   Temp 97.8 F (36.6 C) (Oral)   Ht 5\' 3"  (1.6 m)   Wt 241 lb 3.2 oz (109.4 kg)   SpO2 97%   BMI 42.73 kg/m   BP/Weight 09/17/2017 01/28/8118 10/26/7827  Systolic BP 562 130 865  Diastolic BP 91 75 83  Wt. (Lbs) 241.2 237 237  BMI 42.73 41.98 41.98      Physical Exam  Constitutional: She is oriented to person, place, and time. She appears well-developed and well-nourished.  Cardiovascular: Normal rate, normal heart sounds and intact distal pulses.  No murmur heard. Pulmonary/Chest: Effort normal and breath sounds normal. She has no wheezes. She has no rales. She exhibits no tenderness.  Abdominal: Soft. Bowel sounds are normal. She exhibits no distension and no mass. There is no tenderness (-ve murphy's sign).  Musculoskeletal: Normal range of motion. She exhibits no edema or tenderness.  Negative straight leg raise bilaterally  Neurological: She is alert and oriented to person, place, and time.  Skin: Skin is warm and dry.  Psychiatric: She has a normal mood and affect.    CMP Latest Ref Rng & Units 04/26/2017 06/18/2016 03/13/2016  Glucose 65 - 99 mg/dL 98 109(H) 100(H)  BUN 6 - 24 mg/dL 11 9 12   Creatinine 0.57 - 1.00 mg/dL 0.89 1.09(H) 0.88  Sodium 134 -  144 mmol/L 143 139 141  Potassium 3.5 - 5.2 mmol/L 4.4 4.2 4.1  Chloride 96 - 106 mmol/L 103 111 109  CO2 20 - 29 mmol/L 23 21(L) 23  Calcium 8.7 - 10.2 mg/dL 9.4 8.7(L) 8.7  Total Protein 6.0 - 8.5 g/dL 7.1 6.0(L) -  Total Bilirubin 0.0 - 1.2 mg/dL 0.3 0.2(L) -  Alkaline Phos 39 - 117 IU/L 115 81 -  AST 0 - 40 IU/L 15 19 -  ALT 0 - 32 IU/L 19 15 -     Lipid Panel     Component Value Date/Time   CHOL 182 04/26/2017 1146   TRIG 95 04/26/2017 1146   HDL 46 04/26/2017 1146   CHOLHDL 4.0 04/26/2017 1146   CHOLHDL 4.8 05/17/2014 0330   VLDL 27 05/17/2014 0330   LDLCALC 117 (H) 04/26/2017  1146    Assessment & Plan:   1. Essential hypertension Slightly elevated above goal of <130/80 No regimen change Low-sodium diet - amLODipine (NORVASC) 10 MG tablet; Take 1 tablet (10 mg total) by mouth daily.  Dispense: 30 tablet; Refill: 5 - losartan (COZAAR) 25 MG tablet; Take 1 tablet (25 mg total) by mouth daily.  Dispense: 30 tablet; Refill: 6  2. Chronic midline low back pain without sciatica Hypertension Uses tramadol sparingly - traMADol (ULTRAM) 50 MG tablet; Take 1 tablet (50 mg total) by mouth every 12 (twelve) hours as needed.  Dispense: 40 tablet; Refill: 0  3. Other insomnia Uncontrolled Need a sleep study to assess for underlying obstructive sleep apnea however she has no medical coverage and has been advised to apply for the [employer] financial discount to facilitate this referral. - zolpidem (AMBIEN) 5 MG tablet; Take 1 tablet (5 mg total) by mouth at bedtime as needed for sleep.  Dispense: 30 tablet; Refill: 1  4. Hyperlipidemia LDL goal <100 Slightly elevated LDL of 117; goal is less than 100 Continue statin and low-cholesterol diet Will increase statin dose at next visit if still elevated - atorvastatin (LIPITOR) 20 MG tablet; Take 1 tablet (20 mg total) by mouth daily.  Dispense: 30 tablet; Refill: 5  5. Chronic migraine without aura without status migrainosus, not  intractable Controlled - topiramate (TOPAMAX) 50 MG tablet; Take 1 tablet (50 mg total) by mouth 2 (two) times daily.  Dispense: 60 tablet; Refill: 5  6. Neuropathy Stable - gabapentin (NEURONTIN) 300 MG capsule; Take 1 capsule (300 mg total) by mouth 2 (two) times daily.  Dispense: 60 capsule; Refill: 3  7. Right upper quadrant abdominal pain We need to exclude gallstones - US Abdomen Limited RUQ; Future  8. Menorrhagia with irregular cycle Could explain intermittent fatigue She is perimenopausal at this time - CBC with Differential/Platelet  9. Stress incontinence Discussed Kegel exercises Commence oxybutynin-discussed side effects - oxybutynin (DITROPAN) 5 MG tablet; Take 1 tablet (5 mg total) by mouth 2 (two) times daily.  Dispense: 60 tablet; Refill: 3  10. Other emphysema ([place]) No recent exacerbations Advised that smoking cessation will help retard progression of condition - albuterol (PROVENTIL HFA;VENTOLIN HFA) 108 (90 Base) MCG/ACT inhaler; Inhale 2 puffs into the lungs every 6 (six) hours as needed for wheezing or shortness of breath.  Dispense: 1 Inhaler; Refill: 2  11. Chronic diastolic heart failure (HCC) EF 85-27%, grade 1 diastolic dysfunction from 2D echo 06/2017 Euvolemic.  Meds ordered this encounter  Medications  . traMADol (ULTRAM) 50 MG tablet    Sig: Take 1 tablet (50 mg total) by mouth every 12 (twelve) hours as needed.    Dispense:  40 tablet    Refill:  0  . zolpidem (AMBIEN) 5 MG tablet    Sig: Take 1 tablet (5 mg total) by mouth at bedtime as needed for sleep.    Dispense:  30 tablet    Refill:  1  . topiramate (TOPAMAX) 50 MG tablet    Sig: Take 1 tablet (50 mg total) by mouth 2 (two) times daily.    Dispense:  60 tablet    Refill:  5  . gabapentin (NEURONTIN) 300 MG capsule    Sig: Take 1 capsule (300 mg total) by mouth 2 (two) times daily.    Dispense:  60 capsule    Refill:  3  . famotidine (PEPCID) 20 MG tablet    Sig: Take 1 tablet (20  mg total)  by mouth 2 (two) times daily.    Dispense:  60 tablet    Refill:  3  . atorvastatin (LIPITOR) 20 MG tablet    Sig: Take 1 tablet (20 mg total) by mouth daily.    Dispense:  30 tablet    Refill:  5  . amLODipine (NORVASC) 10 MG tablet    Sig: Take 1 tablet (10 mg total) by mouth daily.    Dispense:  30 tablet    Refill:  5  . losartan (COZAAR) 25 MG tablet    Sig: Take 1 tablet (25 mg total) by mouth daily.    Dispense:  30 tablet    Refill:  6    Discontinue lisinopril  . oxybutynin (DITROPAN) 5 MG tablet    Sig: Take 1 tablet (5 mg total) by mouth 2 (two) times daily.    Dispense:  60 tablet    Refill:  3  . albuterol (PROVENTIL HFA;VENTOLIN HFA) 108 (90 Base) MCG/ACT inhaler    Sig: Inhale 2 puffs into the lungs every 6 (six) hours as needed for wheezing or shortness of breath.    Dispense:  1 Inhaler    Refill:  2    Follow-up: Return in about 3 months (around 12/18/2017) for Follow-up of hypertension.   Arnoldo Morale MD

## 2017-09-18 ENCOUNTER — Encounter: Payer: Self-pay | Admitting: Family Medicine

## 2017-09-18 LAB — CBC WITH DIFFERENTIAL/PLATELET
BASOS ABS: 0 10*3/uL (ref 0.0–0.2)
Basos: 0 %
EOS (ABSOLUTE): 0.2 10*3/uL (ref 0.0–0.4)
EOS: 3 %
HEMATOCRIT: 41.8 % (ref 34.0–46.6)
HEMOGLOBIN: 14.6 g/dL (ref 11.1–15.9)
IMMATURE GRANULOCYTES: 0 %
Immature Grans (Abs): 0 10*3/uL (ref 0.0–0.1)
Lymphocytes Absolute: 3.1 10*3/uL (ref 0.7–3.1)
Lymphs: 45 %
MCH: 29.4 pg (ref 26.6–33.0)
MCHC: 34.9 g/dL (ref 31.5–35.7)
MCV: 84 fL (ref 79–97)
MONOCYTES: 13 %
Monocytes Absolute: 0.9 10*3/uL (ref 0.1–0.9)
NEUTROS PCT: 39 %
Neutrophils Absolute: 2.8 10*3/uL (ref 1.4–7.0)
Platelets: 258 10*3/uL (ref 150–379)
RBC: 4.97 x10E6/uL (ref 3.77–5.28)
RDW: 15.7 % — AB (ref 12.3–15.4)
WBC: 7.1 10*3/uL (ref 3.4–10.8)

## 2017-09-19 ENCOUNTER — Telehealth: Payer: Self-pay

## 2017-09-19 NOTE — Telephone Encounter (Signed)
Pt was called and informed of lab results. 

## 2017-09-20 ENCOUNTER — Ambulatory Visit (HOSPITAL_COMMUNITY)
Admission: RE | Admit: 2017-09-20 | Discharge: 2017-09-20 | Disposition: A | Discharge status: Home / Self Care | Payer: Self-pay | Source: Ambulatory Visit | Attending: Family Medicine | Admitting: Family Medicine

## 2017-09-20 DIAGNOSIS — R1011 Right upper quadrant pain: Secondary | ICD-10-CM | POA: Insufficient documentation

## 2017-09-24 ENCOUNTER — Telehealth: Payer: Self-pay

## 2017-09-24 NOTE — Telephone Encounter (Signed)
Pt was called and informed of ultrasound results. 

## 2017-10-27 ENCOUNTER — Other Ambulatory Visit: Payer: Self-pay

## 2017-10-27 ENCOUNTER — Emergency Department (HOSPITAL_COMMUNITY): Admission: EM | Admit: 2017-10-27 | Discharge: 2017-10-27 | Discharge status: Against Medical Advice | Payer: Self-pay

## 2017-10-27 NOTE — ED Notes (Signed)
Pt called no answer 

## 2017-10-27 NOTE — ED Notes (Signed)
Called PT, no answer.

## 2017-10-31 MED FILL — ?ATORVASTATIN 20MG TABLET: 20 | 30 days supply | Qty: 30 | Fill #2

## 2017-10-31 MED FILL — LOSARTAN POTASSIUM 25 MG TA: 25 | 30 days supply | Qty: 30 | Fill #4

## 2017-10-31 MED FILL — AMLODIPINE BESYLATE 10 MG T: 10 | 30 days supply | Qty: 30 | Fill #4

## 2017-10-31 MED FILL — !VENTOLIN HFA INHALER: 108 (90 BAS | 25 days supply | Qty: 18 | Fill #1

## 2017-12-04 ENCOUNTER — Ambulatory Visit: Payer: Self-pay | Attending: Family Medicine | Admitting: Physician Assistant

## 2017-12-04 VITALS — BP 159/98 | HR 85 | Temp 98.0°F | Resp 16 | Wt 238.4 lb

## 2017-12-04 DIAGNOSIS — E669 Obesity, unspecified: Secondary | ICD-10-CM | POA: Insufficient documentation

## 2017-12-04 DIAGNOSIS — F329 Major depressive disorder, single episode, unspecified: Secondary | ICD-10-CM | POA: Insufficient documentation

## 2017-12-04 DIAGNOSIS — I10 Essential (primary) hypertension: Secondary | ICD-10-CM

## 2017-12-04 DIAGNOSIS — Z8673 Personal history of transient ischemic attack (TIA), and cerebral infarction without residual deficits: Secondary | ICD-10-CM | POA: Insufficient documentation

## 2017-12-04 DIAGNOSIS — I509 Heart failure, unspecified: Secondary | ICD-10-CM | POA: Insufficient documentation

## 2017-12-04 DIAGNOSIS — K047 Periapical abscess without sinus: Secondary | ICD-10-CM | POA: Insufficient documentation

## 2017-12-04 DIAGNOSIS — D573 Sickle-cell trait: Secondary | ICD-10-CM | POA: Insufficient documentation

## 2017-12-04 DIAGNOSIS — J45909 Unspecified asthma, uncomplicated: Secondary | ICD-10-CM | POA: Insufficient documentation

## 2017-12-04 DIAGNOSIS — Z79899 Other long term (current) drug therapy: Secondary | ICD-10-CM | POA: Insufficient documentation

## 2017-12-04 DIAGNOSIS — I110 Hypertensive heart disease with heart failure: Secondary | ICD-10-CM | POA: Insufficient documentation

## 2017-12-04 MED ORDER — TRAMADOL HCL 50 MG PO TABS: 50.0000 mg | ORAL_TABLET | Freq: Two times a day (BID) | ORAL | 0 refills | Status: DC | PRN

## 2017-12-04 MED ORDER — IBUPROFEN 800 MG PO TABS: 800.0000 mg | ORAL_TABLET | Freq: Three times a day (TID) | ORAL | 0 refills | Status: DC | PRN

## 2017-12-04 MED ORDER — AMOXICILLIN 500 MG PO CAPS: 500.0000 mg | ORAL_CAPSULE | Freq: Three times a day (TID) | ORAL | 0 refills | Status: DC

## 2017-12-04 MED FILL — AMOXICILLIN 500 MG CAPSULE: 500 | 10 days supply | Qty: 30 | Fill #0

## 2017-12-04 MED FILL — AMLODIPINE BESYLATE 10 MG T: 10 | 30 days supply | Qty: 30 | Fill #5

## 2017-12-04 MED FILL — LOSARTAN POTASSIUM 25 MG TA: 25 | 30 days supply | Qty: 30 | Fill #5

## 2017-12-04 MED FILL — traMADol HCL 50 MG TABS: 50 | 15 days supply | Qty: 30 | Fill #0

## 2017-12-04 MED FILL — ?FAMOTIDINE 20 MG TABLET: 20 | 30 days supply | Qty: 60 | Fill #0

## 2017-12-04 MED FILL — IBUPROFEN 800 MG TABLET: 800 | 10 days supply | Qty: 30 | Fill #0

## 2017-12-04 MED FILL — ?ATORVASTATIN 20 MG TABLET: 20 | 30 days supply | Qty: 30 | Fill #3

## 2017-12-04 NOTE — Progress Notes (Signed)
Patient ID: Taylor Vazquez, female   DOB: 03-06-1966, 52 y.o.   MRN: [id number]   Taylor Vazquez, is a 52 y.o. female  [id number]  [id number]  DOB - 1966-03-17  Subjective:  Chief Complaint and HPI: Taylor Vazquez is a 52 y.o. female here today for L sided jaw and tooth pain for 2 weeks.  Ibuprofen 800mg  not helping with pain.  Started to run a low grade fever 2 days ago.  Needs something stronger for pain.  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    ROS:   Constitutional:  No f/c, No night sweats, No unexplained weight loss. EENT:  No vision changes, No blurry vision, No hearing changes. No other mouth, throat, or ear problems.  Respiratory: No cough, No SOB Cardiac: No CP, no palpitations GI:  No abd pain, No N/V/D. GU: No Urinary s/sx Musculoskeletal: No joint pain Neuro: No headache, no dizziness, no motor weakness.  Skin: No rash Endocrine:  No polydipsia. No polyuria.  Psych: Denies SI/HI  No problems updated.  ALLERGIES: Allergies  Allergen Reactions  . Bee Venom Anaphylaxis  . Shellfish Allergy Anaphylaxis and Swelling    PAST MEDICAL HISTORY: Past Medical History:  Diagnosis Date  . Asthma   . CHF (congestive heart failure) ([place])   . Depression   . Hypertension   . Obesity   . Sickle cell trait ([place])   . Stroke [facility])     MEDICATIONS AT HOME: Prior to Admission medications   Medication Sig Start Date End Date Taking? Authorizing Provider  acetaminophen (TYLENOL) 325 MG tablet Take 2 tablets (650 mg total) by mouth every 6 (six) hours as needed for mild pain or moderate pain. Patient not taking: Reported on 09/17/2017 06/10/15   Kinnie Feil, MD  acetaminophen (TYLENOL) 500 MG tablet Take 1,000 mg by mouth every 6 (six) hours as needed for moderate pain.    [provider]  albuterol (PROVENTIL HFA;VENTOLIN HFA) 108 (90 Base) MCG/ACT inhaler Inhale 2 puffs  into the lungs every 6 (six) hours as needed for wheezing or shortness of breath. 09/17/17   Charlott Rakes, MD  amLODipine (NORVASC) 10 MG tablet Take 1 tablet (10 mg total) by mouth daily. 09/17/17   Charlott Rakes, MD  amoxicillin (AMOXIL) 500 MG capsule Take 1 capsule (500 mg total) by mouth 3 (three) times daily. 12/04/17   Argentina Donovan, PA-C  aspirin 325 MG tablet Take 1 tablet (325 mg total) by mouth daily. Patient not taking: Reported on 06/17/2017 09/30/15   Charlott Rakes, MD  atorvastatin (LIPITOR) 20 MG tablet Take 1 tablet (20 mg total) by mouth daily. 09/17/17   Charlott Rakes, MD  clotrimazole (LOTRIMIN) 1 % cream Apply to affected area 2 times daily Patient not taking: Reported on 06/17/2017 01/05/17   Fransico Meadow, PA-C  famotidine (PEPCID) 20 MG tablet Take 1 tablet (20 mg total) by mouth 2 (two) times daily. 09/17/17   Charlott Rakes, MD  Fluticasone-Salmeterol (ADVAIR DISKUS) 100-50 MCG/DOSE AEPB INHALE 1 PUFF INTO THE LUNGS 2 TIMES DAILY. 04/26/17   Charlott Rakes, MD  gabapentin (NEURONTIN) 300 MG capsule Take 1 capsule (300 mg total) by mouth 2 (two) times daily. 09/17/17   Charlott Rakes, MD  ibuprofen (ADVIL,MOTRIN) 800 MG tablet Take 1 tablet (800 mg total) by mouth every 8 (eight) hours as needed. With food 12/04/17   Argentina Donovan, PA-C  losartan (COZAAR) 25 MG tablet Take 1 tablet (25 mg total) by mouth daily. 09/17/17   Charlott Rakes, MD  Menthol, Topical Analgesic, (BENGAY EX) Apply 1 application topically as needed (for pain).    [provider]  oxybutynin (DITROPAN) 5 MG tablet Take 1 tablet (5 mg total) by mouth 2 (two) times daily. 09/17/17   Charlott Rakes, MD  topiramate (TOPAMAX) 50 MG tablet Take 1 tablet (50 mg total) by mouth 2 (two) times daily. 09/17/17   Charlott Rakes, MD  traMADol (ULTRAM) 50 MG tablet Take 1 tablet (50 mg total) by mouth every 12 (twelve) hours as needed. 12/04/17   Argentina Donovan, PA-C  zolpidem (AMBIEN) 5 MG  tablet Take 1 tablet (5 mg total) by mouth at bedtime as needed for sleep. 09/17/17   Charlott Rakes, MD  lurasidone (LATUDA) 40 MG TABS tablet Take 1 tablet (40 mg total) by mouth daily with breakfast. Patient not taking: Reported on 08/10/2015 06/10/15 08/10/15  Kinnie Feil, MD     Objective:  EXAM:   Vitals:   12/04/17 1143  BP: (!) 159/98  Pulse: 85  Resp: 16  Temp: 98 F (36.7 C)  TempSrc: Oral  SpO2: 95%  Weight: 238 lb 6.4 oz (108.1 kg)    General appearance : A&OX3. NAD. Non-toxic-appearing HEENT: Atraumatic and Normocephalic.  PERRLA. EOM intact.  TM clear B. Mouth-MMM, post pharynx WNL w/o erythema, No PND.  Poor dentition generally.  Tooth #17 on lower L only partially through and there is erythema and swelling at the gum base Neck: supple, no JVD. No cervical lymphadenopathy. No thyromegaly Chest/Lungs:  Breathing-non-labored, Good air entry bilaterally, breath sounds normal without rales, rhonchi, or wheezing  CVS: S1 S2 regular, no murmurs, gallops, rubs  Extremities: Bilateral Lower Ext shows no edema, both legs are warm to touch with = pulse throughout Neurology:  CN II-XII grossly intact, Non focal.   Psych:  TP linear. J/I WNL. Normal speech. Appropriate eye contact and affect.  Skin:  No Rash  Data Review Lab Results  Component Value Date   HGBA1C 5.6 04/26/2017   HGBA1C 5.9 (H) 03/13/2016   HGBA1C 5.9 (H) 05/17/2014     Assessment & Plan   1. Tooth abscess Will need dental referral once orange card is in place - traMADol (ULTRAM) 50 MG tablet; Take 1 tablet (50 mg total) by mouth every 12 (twelve) hours as needed.  Dispense: 30 tablet; Refill: 0(use sparingly or not at all if possible-I did provide patient education on tramadol and how it can be addictive.  R/B discussed at length.   - amoxicillin (AMOXIL) 500 MG capsule; Take 1 capsule (500 mg total) by mouth 3 (three) times daily.  Dispense: 30 capsule; Refill: 0 - ibuprofen (ADVIL,MOTRIN)  800 MG tablet; Take 1 tablet (800 mg total) by mouth every 8 (eight) hours as needed. With food  Dispense: 30 tablet; Refill: 0  2. Essential hypertension Uncontrolled but didn't take meds today.  Take meds and check BP OOO.     Patient have been counseled extensively about nutrition and exercise  Return for keep 2/27 appt with Dr Margarita Rana.  The patient was given clear instructions to go to ER or return to medical center if symptoms don't improve, worsen or new problems develop. The patient verbalized understanding. The patient was told to call to get lab results if they haven't heard anything in the next week.     Freeman Caldron, PA-C [facility] and  [place], [place]   12/04/2017, 1:23 PM

## 2017-12-18 ENCOUNTER — Ambulatory Visit: Payer: Self-pay | Admitting: Family Medicine

## 2018-01-13 ENCOUNTER — Ambulatory Visit: Payer: Self-pay

## 2018-01-13 MED FILL — LOSARTAN POTASSIUM 25 MG TA: 25 | 30 days supply | Qty: 30 | Fill #6

## 2018-01-13 MED FILL — ATORVASTATIN 20 MG TABLET: 20 | 30 days supply | Qty: 30 | Fill #4

## 2018-01-13 MED FILL — AMLODIPINE BESYLATE 10 MG T: 10 | 30 days supply | Qty: 30 | Fill #0

## 2018-01-13 MED FILL — !VENTOLIN HFA INHALER: 108 (90 BAS | 25 days supply | Qty: 18 | Fill #2

## 2018-01-13 MED FILL — FAMOTIDINE 20 MG TABS: 20 | 30 days supply | Qty: 60 | Fill #1

## 2018-01-14 ENCOUNTER — Ambulatory Visit: Payer: Self-pay | Attending: Family Medicine | Admitting: Family Medicine

## 2018-01-14 ENCOUNTER — Encounter: Payer: Self-pay | Admitting: Family Medicine

## 2018-01-14 DIAGNOSIS — I10 Essential (primary) hypertension: Secondary | ICD-10-CM

## 2018-01-14 DIAGNOSIS — D573 Sickle-cell trait: Secondary | ICD-10-CM | POA: Insufficient documentation

## 2018-01-14 DIAGNOSIS — Z7982 Long term (current) use of aspirin: Secondary | ICD-10-CM | POA: Insufficient documentation

## 2018-01-14 DIAGNOSIS — F329 Major depressive disorder, single episode, unspecified: Secondary | ICD-10-CM | POA: Insufficient documentation

## 2018-01-14 DIAGNOSIS — Z79899 Other long term (current) drug therapy: Secondary | ICD-10-CM | POA: Insufficient documentation

## 2018-01-14 DIAGNOSIS — Z6841 Body Mass Index (BMI) 40.0 and over, adult: Secondary | ICD-10-CM | POA: Insufficient documentation

## 2018-01-14 DIAGNOSIS — G629 Polyneuropathy, unspecified: Secondary | ICD-10-CM

## 2018-01-14 DIAGNOSIS — G43709 Chronic migraine without aura, not intractable, without status migrainosus: Secondary | ICD-10-CM

## 2018-01-14 DIAGNOSIS — R7303 Prediabetes: Secondary | ICD-10-CM

## 2018-01-14 DIAGNOSIS — Z8673 Personal history of transient ischemic attack (TIA), and cerebral infarction without residual deficits: Secondary | ICD-10-CM | POA: Insufficient documentation

## 2018-01-14 DIAGNOSIS — E669 Obesity, unspecified: Secondary | ICD-10-CM | POA: Insufficient documentation

## 2018-01-14 DIAGNOSIS — I110 Hypertensive heart disease with heart failure: Secondary | ICD-10-CM | POA: Insufficient documentation

## 2018-01-14 DIAGNOSIS — E785 Hyperlipidemia, unspecified: Secondary | ICD-10-CM

## 2018-01-14 DIAGNOSIS — Z833 Family history of diabetes mellitus: Secondary | ICD-10-CM | POA: Insufficient documentation

## 2018-01-14 DIAGNOSIS — J449 Chronic obstructive pulmonary disease, unspecified: Secondary | ICD-10-CM | POA: Insufficient documentation

## 2018-01-14 DIAGNOSIS — I5032 Chronic diastolic (congestive) heart failure: Secondary | ICD-10-CM | POA: Insufficient documentation

## 2018-01-14 DIAGNOSIS — J438 Other emphysema: Secondary | ICD-10-CM

## 2018-01-14 DIAGNOSIS — K047 Periapical abscess without sinus: Secondary | ICD-10-CM

## 2018-01-14 LAB — POCT GLYCOSYLATED HEMOGLOBIN (HGB A1C): HEMOGLOBIN A1C: 6

## 2018-01-14 MED ORDER — CLINDAMYCIN HCL 300 MG PO CAPS: 300.0000 mg | ORAL_CAPSULE | Freq: Two times a day (BID) | ORAL | 0 refills | Status: DC

## 2018-01-14 MED ORDER — AMLODIPINE BESYLATE 10 MG PO TABS: 10.0000 mg | ORAL_TABLET | Freq: Every day | ORAL | 5 refills | Status: DC

## 2018-01-14 MED ORDER — LOSARTAN POTASSIUM 25 MG PO TABS: 25.0000 mg | ORAL_TABLET | Freq: Every day | ORAL | 6 refills | Status: DC

## 2018-01-14 MED ORDER — FLUTICASONE-SALMETEROL 100-50 MCG/DOSE IN AEPB
INHALATION_SPRAY | RESPIRATORY_TRACT | 3 refills | Status: DC
Start: 2018-01-14 — End: 2018-12-31

## 2018-01-14 MED ORDER — TOPIRAMATE 50 MG PO TABS: 50.0000 mg | ORAL_TABLET | Freq: Two times a day (BID) | ORAL | 5 refills | Status: DC

## 2018-01-14 MED ORDER — FAMOTIDINE 20 MG PO TABS: 20.0000 mg | ORAL_TABLET | Freq: Two times a day (BID) | ORAL | 3 refills | Status: DC

## 2018-01-14 MED ORDER — METFORMIN HCL 500 MG PO TABS: 500.0000 mg | ORAL_TABLET | Freq: Two times a day (BID) | ORAL | 3 refills | Status: DC

## 2018-01-14 MED ORDER — GABAPENTIN 300 MG PO CAPS: 600.0000 mg | ORAL_CAPSULE | Freq: Two times a day (BID) | ORAL | 3 refills | Status: DC

## 2018-01-14 MED ORDER — ATORVASTATIN CALCIUM 20 MG PO TABS: 20.0000 mg | ORAL_TABLET | Freq: Every day | ORAL | 5 refills | Status: DC

## 2018-01-14 MED ORDER — TRAMADOL HCL 50 MG PO TABS: 50.0000 mg | ORAL_TABLET | Freq: Two times a day (BID) | ORAL | 0 refills | Status: DC | PRN

## 2018-01-14 MED FILL — traMADol HCL 50 MG TABS: 50 | 15 days supply | Qty: 30 | Fill #0

## 2018-01-14 MED FILL — CLINDAMYCIN HCL 300 MG CAP: 300 | 10 days supply | Qty: 20 | Fill #0

## 2018-01-14 MED FILL — TOPIRAMATE 50 MG TABLET: 50 | 30 days supply | Qty: 60 | Fill #0

## 2018-01-14 MED FILL — **ADVAIR 100/50 DISKUS: 100-50 MCG | 14 days supply | Qty: 28 | Fill #0

## 2018-01-14 MED FILL — metFORMIN HCL 500 MG TABS: 500 | 30 days supply | Qty: 60 | Fill #0

## 2018-01-14 MED FILL — GABAPENTIN 300 MG CAPSULE: 300 | 30 days supply | Qty: 120 | Fill #0

## 2018-01-14 NOTE — Progress Notes (Signed)
Subjective:  Patient ID: Taylor Vazquez, female    DOB: 1965-12-22  Age: 52 y.o. MRN: [id number]  CC: Hypertension   HPI Taylor Vazquez  is a 52 year-old female with a history of COPD, asthma, diastolic CHF (EF 51-70%), obesity who presents today for a follow-up visit. Complains of toothache which she has had for the last 1 month, it feels like "her head will pop" and she received amoxicillin at her last office visit but has been unable to see a dentist due to lack of medical coverage.  I had referred her to a dentist last year but she informs me there was no dental office at the address which she had received for her appointment. Toothache occurs in the lower incisors, bilateral lower premolar does not radiate to her ears and head.  Her COPD is stable and she denies shortness of breath or wheezing. Tolerating her antihypertensive with no adverse effects and doing well on her statin. Migraine is controlled. She complains of numbness in both legs which are not controlled.  She has a family history of diabetes mellitus and also has a history of prediabetes and is wondering if she has now developed diabetes mellitus.   Past Medical History:  Diagnosis Date  . Asthma   . CHF (congestive heart failure) ([place])   . Depression   . Hypertension   . Obesity   . Sickle cell trait ([place])   . Stroke [facility])     Past Surgical History:  Procedure Laterality Date  . NO PAST SURGERIES      Allergies  Allergen Reactions  . Bee Venom Anaphylaxis  . Shellfish Allergy Anaphylaxis and Swelling     Outpatient Medications Prior to Visit  Medication Sig Dispense Refill  . albuterol (PROVENTIL HFA;VENTOLIN HFA) 108 (90 Base) MCG/ACT inhaler Inhale 2 puffs into the lungs every 6 (six) hours as needed for wheezing or shortness of breath. 1 Inhaler 2  . aspirin 325 MG tablet Take 1 tablet (325 mg total) by mouth daily. 30 tablet 2  . clotrimazole (LOTRIMIN) 1 % cream Apply to affected area 2  times daily 60 g 0  . ibuprofen (ADVIL,MOTRIN) 800 MG tablet Take 1 tablet (800 mg total) by mouth every 8 (eight) hours as needed. With food 30 tablet 0  . Menthol, Topical Analgesic, (BENGAY EX) Apply 1 application topically as needed (for pain).    Marland Kitchen zolpidem (AMBIEN) 5 MG tablet Take 1 tablet (5 mg total) by mouth at bedtime as needed for sleep. 30 tablet 1  . amLODipine (NORVASC) 10 MG tablet Take 1 tablet (10 mg total) by mouth daily. 30 tablet 5  . atorvastatin (LIPITOR) 20 MG tablet Take 1 tablet (20 mg total) by mouth daily. 30 tablet 5  . famotidine (PEPCID) 20 MG tablet Take 1 tablet (20 mg total) by mouth 2 (two) times daily. 60 tablet 3  . Fluticasone-Salmeterol (ADVAIR DISKUS) 100-50 MCG/DOSE AEPB INHALE 1 PUFF INTO THE LUNGS 2 TIMES DAILY. 60 each 3  . gabapentin (NEURONTIN) 300 MG capsule Take 1 capsule (300 mg total) by mouth 2 (two) times daily. 60 capsule 3  . losartan (COZAAR) 25 MG tablet Take 1 tablet (25 mg total) by mouth daily. 30 tablet 6  . topiramate (TOPAMAX) 50 MG tablet Take 1 tablet (50 mg total) by mouth 2 (two) times daily. 60 tablet 5  . traMADol (ULTRAM) 50 MG tablet Take 1 tablet (50 mg total) by mouth every 12 (twelve) hours as needed.  30 tablet 0  . acetaminophen (TYLENOL) 325 MG tablet Take 2 tablets (650 mg total) by mouth every 6 (six) hours as needed for mild pain or moderate pain. (Patient not taking: Reported on 09/17/2017)    . acetaminophen (TYLENOL) 500 MG tablet Take 1,000 mg by mouth every 6 (six) hours as needed for moderate pain.    Marland Kitchen oxybutynin (DITROPAN) 5 MG tablet Take 1 tablet (5 mg total) by mouth 2 (two) times daily. (Patient not taking: Reported on 01/14/2018) 60 tablet 3  . amoxicillin (AMOXIL) 500 MG capsule Take 1 capsule (500 mg total) by mouth 3 (three) times daily. (Patient not taking: Reported on 01/14/2018) 30 capsule 0   No facility-administered medications prior to visit.     ROS Review of Systems  Constitutional: Negative for  activity change, appetite change and fatigue.  HENT: Positive for dental problem. Negative for congestion, sinus pressure and sore throat.   Eyes: Negative for visual disturbance.  Respiratory: Negative for cough, chest tightness, shortness of breath and wheezing.   Cardiovascular: Negative for chest pain and palpitations.  Gastrointestinal: Negative for abdominal distention, abdominal pain and constipation.  Endocrine: Negative for polydipsia.  Genitourinary: Negative for dysuria and frequency.  Musculoskeletal: Negative for arthralgias and back pain.  Skin: Negative for rash.  Neurological: Negative for tremors, light-headedness and numbness.  Hematological: Does not bruise/bleed easily.  Psychiatric/Behavioral: Negative for agitation and behavioral problems.    Objective:  BP 137/85   Pulse 68   Temp 97.9 F (36.6 C) (Oral)   Ht '5\' 3"'  (1.6 m)   Wt 239 lb 12.8 oz (108.8 kg)   SpO2 99%   BMI 42.48 kg/m   BP/Weight 01/14/2018 12/04/2017 17/51/0258  Systolic BP 527 782 423  Diastolic BP 85 98 91  Wt. (Lbs) 239.8 238.4 241.2  BMI 42.48 42.23 42.73      Physical Exam  Constitutional: She is oriented to person, place, and time. She appears well-developed and well-nourished.  HENT:  Dental cavities with loose teeth in lower incisor  Cardiovascular: Normal rate, normal heart sounds and intact distal pulses.  No murmur heard. Pulmonary/Chest: Effort normal and breath sounds normal. She has no wheezes. She has no rales. She exhibits no tenderness.  Abdominal: Soft. Bowel sounds are normal. She exhibits no distension and no mass. There is no tenderness.  Musculoskeletal: Normal range of motion.  Neurological: She is alert and oriented to person, place, and time.  Skin: Skin is warm and dry.  Psychiatric: She has a normal mood and affect.     Assessment & Plan:   1. Essential hypertension Controlled Low-sodium diet - amLODipine (NORVASC) 10 MG tablet; Take 1 tablet (10 mg  total) by mouth daily.  Dispense: 30 tablet; Refill: 5 - losartan (COZAAR) 25 MG tablet; Take 1 tablet (25 mg total) by mouth daily.  Dispense: 30 tablet; Refill: 6 - CMP14+EGFR  2. Hyperlipidemia LDL goal <100 Stable Low-cholesterol diet - atorvastatin (LIPITOR) 20 MG tablet; Take 1 tablet (20 mg total) by mouth daily.  Dispense: 30 tablet; Refill: 5  3. Other emphysema (HCC) No acute exacerbation - Fluticasone-Salmeterol (ADVAIR DISKUS) 100-50 MCG/DOSE AEPB; INHALE 1 PUFF INTO THE LUNGS 2 TIMES DAILY.  Dispense: 60 each; Refill: 3  4. Neuropathy Uncontrolled Increase dose of gabapentin - gabapentin (NEURONTIN) 300 MG capsule; Take 2 capsules (600 mg total) by mouth 2 (two) times daily.  Dispense: 120 capsule; Refill: 3 - Vitamin B12  5. Tooth abscess Provided with information regarding free dental  checkups at [place] complains of lack of transportation to make it [facility] - Ambulatory referral to Dentistry - traMADol (ULTRAM) 50 MG tablet; Take 1 tablet (50 mg total) by mouth every 12 (twelve) hours as needed.  Dispense: 30 tablet; Refill: 0 - clindamycin (CLEOCIN) 300 MG capsule; Take 1 capsule (300 mg total) by mouth 2 (two) times daily.  Dispense: 20 capsule; Refill: 0  6. Chronic migraine without aura without status migrainosus, not intractable Stable - topiramate (TOPAMAX) 50 MG tablet; Take 1 tablet (50 mg total) by mouth 2 (two) times daily.  Dispense: 60 tablet; Refill: 5  7. Prediabetes A1c of 6.0 Will commence metformin due to strong family history and as per patient preference Lifestyle modifications to prevent development of diabetes - POCT glycosylated hemoglobin (Hb A1C) - metFORMIN (GLUCOPHAGE) 500 MG tablet; Take 1 tablet (500 mg total) by mouth 2 (two) times daily with a meal.  Dispense: 60 tablet; Refill: 3   Meds ordered this encounter  Medications  . amLODipine (NORVASC) 10 MG tablet    Sig: Take 1 tablet (10 mg total) by mouth daily.      Dispense:  30 tablet    Refill:  5  . atorvastatin (LIPITOR) 20 MG tablet    Sig: Take 1 tablet (20 mg total) by mouth daily.    Dispense:  30 tablet    Refill:  5  . Fluticasone-Salmeterol (ADVAIR DISKUS) 100-50 MCG/DOSE AEPB    Sig: INHALE 1 PUFF INTO THE LUNGS 2 TIMES DAILY.    Dispense:  60 each    Refill:  3  . famotidine (PEPCID) 20 MG tablet    Sig: Take 1 tablet (20 mg total) by mouth 2 (two) times daily.    Dispense:  60 tablet    Refill:  3  . gabapentin (NEURONTIN) 300 MG capsule    Sig: Take 2 capsules (600 mg total) by mouth 2 (two) times daily.    Dispense:  120 capsule    Refill:  3  . losartan (COZAAR) 25 MG tablet    Sig: Take 1 tablet (25 mg total) by mouth daily.    Dispense:  30 tablet    Refill:  6  . traMADol (ULTRAM) 50 MG tablet    Sig: Take 1 tablet (50 mg total) by mouth every 12 (twelve) hours as needed.    Dispense:  30 tablet    Refill:  0  . topiramate (TOPAMAX) 50 MG tablet    Sig: Take 1 tablet (50 mg total) by mouth 2 (two) times daily.    Dispense:  60 tablet    Refill:  5  . clindamycin (CLEOCIN) 300 MG capsule    Sig: Take 1 capsule (300 mg total) by mouth 2 (two) times daily.    Dispense:  20 capsule    Refill:  0  . metFORMIN (GLUCOPHAGE) 500 MG tablet    Sig: Take 1 tablet (500 mg total) by mouth 2 (two) times daily with a meal.    Dispense:  60 tablet    Refill:  3    Follow-up: Return in about 3 months (around 04/16/2018) for follow up of chronic medical conditions.   Charlott Rakes MD

## 2018-01-14 NOTE — Progress Notes (Signed)
Patient is having dental pain. Patient is having pain and numbness in leg.

## 2018-01-15 LAB — CMP14+EGFR
A/G RATIO: 1.3 (ref 1.2–2.2)
ALK PHOS: 140 IU/L — AB (ref 39–117)
ALT: 17 IU/L (ref 0–32)
AST: 21 IU/L (ref 0–40)
Albumin: 4.1 g/dL (ref 3.5–5.5)
BUN/Creatinine Ratio: 16 (ref 9–23)
BUN: 16 mg/dL (ref 6–24)
Bilirubin Total: <0.2 mg/dL (ref 0.0–1.2)
CO2: 18 mmol/L — AB (ref 20–29)
Calcium: 9.3 mg/dL (ref 8.7–10.2)
Chloride: 107 mmol/L — ABNORMAL HIGH (ref 96–106)
Creatinine, Ser: 1 mg/dL (ref 0.57–1.00)
GFR calc Af Amer: 76 mL/min/{1.73_m2} (ref >59)
GFR calc non Af Amer: 66 mL/min/{1.73_m2} (ref >59)
GLOBULIN, TOTAL: 3.2 g/dL (ref 1.5–4.5)
Glucose: 88 mg/dL (ref 65–99)
POTASSIUM: 5.1 mmol/L (ref 3.5–5.2)
SODIUM: 143 mmol/L (ref 134–144)
Total Protein: 7.3 g/dL (ref 6.0–8.5)

## 2018-01-15 LAB — VITAMIN B12: VITAMIN B 12: 873 pg/mL (ref 232–1245)

## 2018-01-16 ENCOUNTER — Telehealth: Payer: Self-pay

## 2018-01-16 NOTE — Telephone Encounter (Signed)
Patient was called and informed of lab results. 

## 2018-01-17 ENCOUNTER — Telehealth: Payer: Self-pay | Admitting: Family Medicine

## 2018-01-17 NOTE — Telephone Encounter (Signed)
Patient was called and results were clarified for patient.

## 2018-01-17 NOTE — Telephone Encounter (Signed)
Patient called to speak with you about lab work please call her back at [phone]

## 2018-01-21 ENCOUNTER — Ambulatory Visit: Payer: Self-pay

## 2018-03-11 ENCOUNTER — Other Ambulatory Visit: Payer: Self-pay | Admitting: Family Medicine

## 2018-03-11 DIAGNOSIS — J438 Other emphysema: Secondary | ICD-10-CM

## 2018-04-10 ENCOUNTER — Ambulatory Visit: Payer: Self-pay | Attending: Family Medicine | Admitting: Family Medicine

## 2018-04-10 ENCOUNTER — Encounter: Payer: Self-pay | Admitting: Family Medicine

## 2018-04-10 VITALS — BP 155/86 | HR 74 | Temp 97.5°F | Ht 63.0 in | Wt 240.2 lb

## 2018-04-10 DIAGNOSIS — F329 Major depressive disorder, single episode, unspecified: Secondary | ICD-10-CM | POA: Insufficient documentation

## 2018-04-10 DIAGNOSIS — Z7984 Long term (current) use of oral hypoglycemic drugs: Secondary | ICD-10-CM | POA: Insufficient documentation

## 2018-04-10 DIAGNOSIS — J449 Chronic obstructive pulmonary disease, unspecified: Secondary | ICD-10-CM | POA: Insufficient documentation

## 2018-04-10 DIAGNOSIS — D573 Sickle-cell trait: Secondary | ICD-10-CM | POA: Insufficient documentation

## 2018-04-10 DIAGNOSIS — Z79899 Other long term (current) drug therapy: Secondary | ICD-10-CM | POA: Insufficient documentation

## 2018-04-10 DIAGNOSIS — K2900 Acute gastritis without bleeding: Secondary | ICD-10-CM

## 2018-04-10 DIAGNOSIS — Z8673 Personal history of transient ischemic attack (TIA), and cerebral infarction without residual deficits: Secondary | ICD-10-CM | POA: Insufficient documentation

## 2018-04-10 DIAGNOSIS — I5032 Chronic diastolic (congestive) heart failure: Secondary | ICD-10-CM | POA: Insufficient documentation

## 2018-04-10 DIAGNOSIS — K047 Periapical abscess without sinus: Secondary | ICD-10-CM

## 2018-04-10 DIAGNOSIS — E669 Obesity, unspecified: Secondary | ICD-10-CM | POA: Insufficient documentation

## 2018-04-10 DIAGNOSIS — Z6841 Body Mass Index (BMI) 40.0 and over, adult: Secondary | ICD-10-CM | POA: Insufficient documentation

## 2018-04-10 DIAGNOSIS — I110 Hypertensive heart disease with heart failure: Secondary | ICD-10-CM | POA: Insufficient documentation

## 2018-04-10 DIAGNOSIS — Z7982 Long term (current) use of aspirin: Secondary | ICD-10-CM | POA: Insufficient documentation

## 2018-04-10 MED ORDER — TRAMADOL HCL 50 MG PO TABS: 50.0000 mg | ORAL_TABLET | Freq: Two times a day (BID) | ORAL | 0 refills | Status: DC | PRN

## 2018-04-10 MED ORDER — CLINDAMYCIN HCL 300 MG PO CAPS: 300.0000 mg | ORAL_CAPSULE | Freq: Two times a day (BID) | ORAL | 0 refills | Status: DC

## 2018-04-10 MED ORDER — OMEPRAZOLE 40 MG PO CPDR: 40.0000 mg | DELAYED_RELEASE_CAPSULE | Freq: Every day | ORAL | 3 refills | Status: DC

## 2018-04-10 MED FILL — AMLODIPINE BESYLATE 10 MG T: 10 | 30 days supply | Qty: 30 | Fill #0

## 2018-04-10 MED FILL — traMADol HCL 50 MG TABS: 50 | 15 days supply | Qty: 30 | Fill #0

## 2018-04-10 MED FILL — ATORVASTATIN 20 MG TABLET: 20 | 30 days supply | Qty: 30 | Fill #0

## 2018-04-10 MED FILL — LOSARTAN POTASSIUM 25 MG TA: 25 | 30 days supply | Qty: 30 | Fill #0

## 2018-04-10 MED FILL — CLINDAMYCIN HCL 300 MG CAP: 300 | 10 days supply | Qty: 20 | Fill #0

## 2018-04-10 MED FILL — OMEPRAZOLE DR 40 MG CAPSULE: 40 | 30 days supply | Qty: 30 | Fill #0

## 2018-04-10 NOTE — Patient Instructions (Signed)
Gastritis, Adult  Gastritis is swelling (inflammation) of the stomach. When you have this condition, you can have these problems (symptoms):  ? Pain in your stomach.  ? A burning feeling in your stomach.  ? Feeling sick to your stomach (nauseous).  ? Throwing up (vomiting).  ? Feeling too full after you eat.  It is important to get help for this condition. Without help, your stomach can bleed, and you can get sores (ulcers) in your stomach.  Follow these instructions at home:  ? Take over-the-counter and prescription medicines only as told by your doctor.  ? If you were prescribed an antibiotic medicine, take it as told by your doctor. Do not stop taking it even if you start to feel better.  ? Drink enough fluid to keep your pee (urine) clear or pale yellow.  ? Instead of eating big meals, eat small meals often.  Contact a health care provider if:  ? Your problems get worse.  ? Your problems go away and then come back.  Get help right away if:  ? You throw up blood or something that looks like coffee grounds.  ? You have black or dark red poop (stools).  ? You cannot keep fluids down.  ? Your stomach pain gets worse.  ? You have a fever.  ? You do not feel better after 1 week.  This information is not intended to replace advice given to you by your health care provider. Make sure you discuss any questions you have with your health care provider.  Document Released: 03/26/2008 Document Revised: 06/06/2016 Document Reviewed: 07/02/2015  Elsevier Interactive Patient Education ? 2018 Elsevier Inc[employer]

## 2018-04-10 NOTE — Progress Notes (Signed)
Subjective:  Patient ID: Taylor Vazquez, female    DOB: 1966/08/29  Age: 52 y.o. MRN: [id number]  CC: Dental Pain and Abdominal Pain   HPI Taylor Vazquez  is a 52 year-old female with a history of COPD, asthma, diastolic CHF (EF 02-63%), obesity who presents today for an acute visit complaining of toothache.  Treated with amoxicillin for possible tooth abscess at her last visit and she was seen by dentist and will require extraction however she will need to pay out of pocket and is unable to afford this.  Pain radiates to her ears and the left side of her face. She complains of epigastric pain which feels like fire and takes Pepcid intermittently with no relief in symptoms.  Denies nausea, vomiting or diarrhea.  Past Medical History:  Diagnosis Date  . Asthma   . CHF (congestive heart failure) ([place])   . Depression   . Hypertension   . Obesity   . Sickle cell trait ([place])   . Stroke [facility])     Past Surgical History:  Procedure Laterality Date  . NO PAST SURGERIES      Allergies  Allergen Reactions  . Bee Venom Anaphylaxis  . Shellfish Allergy Anaphylaxis and Swelling     Outpatient Medications Prior to Visit  Medication Sig Dispense Refill  . acetaminophen (TYLENOL) 500 MG tablet Take 1,000 mg by mouth every 6 (six) hours as needed for moderate pain.    Marland Kitchen amLODipine (NORVASC) 10 MG tablet Take 1 tablet (10 mg total) by mouth daily. 30 tablet 5  . aspirin 325 MG tablet Take 1 tablet (325 mg total) by mouth daily. 30 tablet 2  . atorvastatin (LIPITOR) 20 MG tablet Take 1 tablet (20 mg total) by mouth daily. 30 tablet 5  . clotrimazole (LOTRIMIN) 1 % cream Apply to affected area 2 times daily 60 g 0  . Fluticasone-Salmeterol (ADVAIR DISKUS) 100-50 MCG/DOSE AEPB INHALE 1 PUFF INTO THE LUNGS 2 TIMES DAILY. 60 each 3  . gabapentin (NEURONTIN) 300 MG capsule Take 2 capsules (600 mg total) by mouth 2 (two) times daily. 120 capsule 3  . ibuprofen (ADVIL,MOTRIN) 800 MG tablet  Take 1 tablet (800 mg total) by mouth every 8 (eight) hours as needed. With food 30 tablet 0  . losartan (COZAAR) 25 MG tablet Take 1 tablet (25 mg total) by mouth daily. 30 tablet 6  . Menthol, Topical Analgesic, (BENGAY EX) Apply 1 application topically as needed (for pain).    . metFORMIN (GLUCOPHAGE) 500 MG tablet Take 1 tablet (500 mg total) by mouth 2 (two) times daily with a meal. 60 tablet 3  . topiramate (TOPAMAX) 50 MG tablet Take 1 tablet (50 mg total) by mouth 2 (two) times daily. 60 tablet 5  . VENTOLIN HFA 108 (90 Base) MCG/ACT inhaler INHALE 2 PUFFS INTO THE LUNGS EVERY 6 (SIX) HOURS AS NEEDED FOR WHEEZING OR SHORTNESS OF BREATH. 18 g 2  . zolpidem (AMBIEN) 5 MG tablet Take 1 tablet (5 mg total) by mouth at bedtime as needed for sleep. 30 tablet 1  . traMADol (ULTRAM) 50 MG tablet Take 1 tablet (50 mg total) by mouth every 12 (twelve) hours as needed. 30 tablet 0  . acetaminophen (TYLENOL) 325 MG tablet Take 2 tablets (650 mg total) by mouth every 6 (six) hours as needed for mild pain or moderate pain. (Patient not taking: Reported on 09/17/2017)    . famotidine (PEPCID) 20 MG tablet Take 1 tablet (20 mg  total) by mouth 2 (two) times daily. (Patient not taking: Reported on 04/10/2018) 60 tablet 3  . oxybutynin (DITROPAN) 5 MG tablet Take 1 tablet (5 mg total) by mouth 2 (two) times daily. (Patient not taking: Reported on 01/14/2018) 60 tablet 3  . clindamycin (CLEOCIN) 300 MG capsule Take 1 capsule (300 mg total) by mouth 2 (two) times daily. (Patient not taking: Reported on 04/10/2018) 20 capsule 0   No facility-administered medications prior to visit.     ROS Review of Systems  Constitutional: Negative for activity change, appetite change and fatigue.  HENT: Positive for dental problem. Negative for congestion, sinus pressure and sore throat.   Eyes: Negative for visual disturbance.  Respiratory: Negative for cough, chest tightness, shortness of breath and wheezing.     Cardiovascular: Negative for chest pain and palpitations.  Gastrointestinal: Positive for abdominal pain. Negative for abdominal distention and constipation.  Endocrine: Negative for polydipsia.  Genitourinary: Negative for dysuria and frequency.  Musculoskeletal: Negative for arthralgias and back pain.  Skin: Negative for rash.  Neurological: Negative for tremors, light-headedness and numbness.  Hematological: Does not bruise/bleed easily.  Psychiatric/Behavioral: Negative for agitation and behavioral problems.    Objective:  BP (!) 155/86   Pulse 74   Temp (!) 97.5 F (36.4 C) (Oral)   Ht 5\' 3"  (1.6 m)   Wt 240 lb 3.2 oz (109 kg)   SpO2 97%   BMI 42.55 kg/m   BP/Weight 04/10/2018 01/14/2018 01/22/4741  Systolic BP 595 638 756  Diastolic BP 86 85 98  Wt. (Lbs) 240.2 239.8 238.4  BMI 42.55 42.48 42.23      Physical Exam  Constitutional: She is oriented to person, place, and time. She appears well-developed and well-nourished.  Cardiovascular: Normal rate, normal heart sounds and intact distal pulses.  No murmur heard. Pulmonary/Chest: Effort normal and breath sounds normal. She has no wheezes. She has no rales. She exhibits no tenderness.  Abdominal: Soft. Bowel sounds are normal. She exhibits no distension and no mass. There is tenderness (epigastric TTP).  Musculoskeletal: Normal range of motion.  Neurological: She is alert and oriented to person, place, and time.  Skin: Skin is warm and dry.     Assessment & Plan:   1. Tooth abscess She needs to see a dentist for tooth extraction but unfortunately finances are a major constraint - traMADol (ULTRAM) 50 MG tablet; Take 1 tablet (50 mg total) by mouth every 12 (twelve) hours as needed.  Dispense: 30 tablet; Refill: 0 - clindamycin (CLEOCIN) 300 MG capsule; Take 1 capsule (300 mg total) by mouth 2 (two) times daily.  Dispense: 20 capsule; Refill: 0  2. Other acute gastritis without hemorrhage Uncontrolled on  Pepcid Switch to omeprazole and will evaluate for H. pylori - H. pylori breath test - omeprazole (PRILOSEC) 40 MG capsule; Take 1 capsule (40 mg total) by mouth daily.  Dispense: 30 capsule; Refill: 3   Meds ordered this encounter  Medications  . traMADol (ULTRAM) 50 MG tablet    Sig: Take 1 tablet (50 mg total) by mouth every 12 (twelve) hours as needed.    Dispense:  30 tablet    Refill:  0  . clindamycin (CLEOCIN) 300 MG capsule    Sig: Take 1 capsule (300 mg total) by mouth 2 (two) times daily.    Dispense:  20 capsule    Refill:  0  . omeprazole (PRILOSEC) 40 MG capsule    Sig: Take 1 capsule (40 mg total) by  mouth daily.    Dispense:  30 capsule    Refill:  3    Follow-up: Return for Follow-up of chronic medical condition, keep previously scheduled appointment.   Charlott Rakes MD

## 2018-04-12 LAB — H. PYLORI BREATH TEST: H pylori Breath Test: POSITIVE — AB

## 2018-04-14 ENCOUNTER — Other Ambulatory Visit: Payer: Self-pay | Admitting: Internal Medicine

## 2018-04-14 MED ORDER — AMOXICILLIN 500 MG PO CAPS: 1000.0000 mg | ORAL_CAPSULE | Freq: Two times a day (BID) | ORAL | 0 refills | Status: DC

## 2018-04-14 MED ORDER — CLARITHROMYCIN 500 MG PO TABS: 500.0000 mg | ORAL_TABLET | Freq: Two times a day (BID) | ORAL | 0 refills | Status: DC

## 2018-04-15 ENCOUNTER — Telehealth: Payer: Self-pay

## 2018-04-15 NOTE — Telephone Encounter (Signed)
Patient was called and informed of lab results and medication being sent over to pharmacy.

## 2018-04-15 NOTE — Telephone Encounter (Signed)
-----   Message from Ladell Pier, MD sent at 04/14/2018  9:55 PM EDT ----- Let pt know that breath test came back positive for stomach bacteria that may be causing her symptom.   Recommend treatment with two abx and Omeprazole.The Omeprazole was prescribed on recent visit.  She should continue to take it with the abx.  The rxns for the abx were sent to our pharmacy.  Please hold off on taking ATORVASTATIN (the cholesterol medication) until after she completes the 10 days of abx.  The Atorvastatin interacts with one of the antibiotics.  Also, please confirm that she is no longer on Latuda (psychiatric medication) as this to can interact with the antibiotics.

## 2018-04-16 ENCOUNTER — Ambulatory Visit: Payer: Self-pay | Admitting: Family Medicine

## 2018-04-23 ENCOUNTER — Ambulatory Visit: Payer: Self-pay | Admitting: Family Medicine

## 2018-04-28 ENCOUNTER — Ambulatory Visit: Payer: Self-pay | Admitting: Family Medicine

## 2018-04-29 MED FILL — AMOXICILLIN 500 MG CAPSULE: 500 | 10 days supply | Qty: 40 | Fill #0

## 2018-05-01 ENCOUNTER — Telehealth: Payer: Self-pay | Admitting: Family Medicine

## 2018-05-01 NOTE — Telephone Encounter (Signed)
Pt called to request a new breathing machine nebulizer, please follow up if she can get a new one

## 2018-05-02 NOTE — Telephone Encounter (Signed)
Pt aware she may pick up nebulizer from office. Aware would need to sign forms and teaching.

## 2018-05-05 ENCOUNTER — Other Ambulatory Visit: Payer: Self-pay | Admitting: *Deleted

## 2018-05-05 DIAGNOSIS — J438 Other emphysema: Secondary | ICD-10-CM

## 2018-05-05 DIAGNOSIS — J441 Chronic obstructive pulmonary disease with (acute) exacerbation: Secondary | ICD-10-CM

## 2018-05-05 MED ORDER — ALBUTEROL SULFATE (2.5 MG/3ML) 0.083% IN NEBU: 2.5000 mg | INHALATION_SOLUTION | RESPIRATORY_TRACT | Status: AC | PRN

## 2018-05-14 ENCOUNTER — Ambulatory Visit: Payer: Self-pay | Admitting: Family Medicine

## 2018-05-29 MED FILL — !VENTOLIN HFA INHALER: 108 (90 BAS | 25 days supply | Qty: 18 | Fill #0

## 2018-05-29 MED FILL — metFORMIN HCL 500 MG TABS: 500 | 30 days supply | Qty: 60 | Fill #1

## 2018-05-29 MED FILL — ATORVASTATIN 20 MG TABLET: 20 | 30 days supply | Qty: 30 | Fill #1

## 2018-05-29 MED FILL — AMLODIPINE BESYLATE 10 MG T: 10 | 30 days supply | Qty: 30 | Fill #1

## 2018-05-29 MED FILL — LOSARTAN POTASSIUM 25 MG TA: 25 | 30 days supply | Qty: 30 | Fill #1

## 2018-05-29 MED FILL — OMEPRAZOLE DR 40 MG CAPSULE: 40 | 30 days supply | Qty: 30 | Fill #1

## 2018-06-06 ENCOUNTER — Telehealth: Payer: Self-pay | Admitting: Family Medicine

## 2018-06-06 NOTE — Telephone Encounter (Signed)
I called Taylor Vazquez to informed her that she need to apply for the [place] program and the [facility] card for pharmacy, or just the blue card since the pharmacy informed me that they wont be able to provide the medication if you don't have the copaid for it...please contact the [place] office to schedule an appt with the financial dept.

## 2018-06-10 ENCOUNTER — Ambulatory Visit: Payer: Self-pay

## 2018-06-18 ENCOUNTER — Ambulatory Visit: Payer: Self-pay | Attending: Family Medicine

## 2018-06-18 ENCOUNTER — Ambulatory Visit (HOSPITAL_BASED_OUTPATIENT_CLINIC_OR_DEPARTMENT_OTHER): Payer: Self-pay | Admitting: Physician Assistant

## 2018-06-18 VITALS — BP 141/87 | HR 84 | Temp 98.4°F | Resp 18 | Ht 63.0 in | Wt 249.0 lb

## 2018-06-18 DIAGNOSIS — R600 Localized edema: Secondary | ICD-10-CM | POA: Insufficient documentation

## 2018-06-18 DIAGNOSIS — Z7984 Long term (current) use of oral hypoglycemic drugs: Secondary | ICD-10-CM | POA: Insufficient documentation

## 2018-06-18 DIAGNOSIS — Z91030 Bee allergy status: Secondary | ICD-10-CM | POA: Insufficient documentation

## 2018-06-18 DIAGNOSIS — D573 Sickle-cell trait: Secondary | ICD-10-CM | POA: Insufficient documentation

## 2018-06-18 DIAGNOSIS — R7303 Prediabetes: Secondary | ICD-10-CM

## 2018-06-18 DIAGNOSIS — R52 Pain, unspecified: Secondary | ICD-10-CM

## 2018-06-18 DIAGNOSIS — Z79899 Other long term (current) drug therapy: Secondary | ICD-10-CM | POA: Insufficient documentation

## 2018-06-18 DIAGNOSIS — J45909 Unspecified asthma, uncomplicated: Secondary | ICD-10-CM | POA: Insufficient documentation

## 2018-06-18 DIAGNOSIS — Z7982 Long term (current) use of aspirin: Secondary | ICD-10-CM | POA: Insufficient documentation

## 2018-06-18 DIAGNOSIS — F17200 Nicotine dependence, unspecified, uncomplicated: Secondary | ICD-10-CM | POA: Insufficient documentation

## 2018-06-18 DIAGNOSIS — E669 Obesity, unspecified: Secondary | ICD-10-CM | POA: Insufficient documentation

## 2018-06-18 DIAGNOSIS — Z8673 Personal history of transient ischemic attack (TIA), and cerebral infarction without residual deficits: Secondary | ICD-10-CM | POA: Insufficient documentation

## 2018-06-18 DIAGNOSIS — Z6841 Body Mass Index (BMI) 40.0 and over, adult: Secondary | ICD-10-CM | POA: Insufficient documentation

## 2018-06-18 DIAGNOSIS — K047 Periapical abscess without sinus: Secondary | ICD-10-CM

## 2018-06-18 DIAGNOSIS — R609 Edema, unspecified: Secondary | ICD-10-CM

## 2018-06-18 DIAGNOSIS — Z7951 Long term (current) use of inhaled steroids: Secondary | ICD-10-CM | POA: Insufficient documentation

## 2018-06-18 DIAGNOSIS — I110 Hypertensive heart disease with heart failure: Secondary | ICD-10-CM | POA: Insufficient documentation

## 2018-06-18 DIAGNOSIS — I509 Heart failure, unspecified: Secondary | ICD-10-CM | POA: Insufficient documentation

## 2018-06-18 DIAGNOSIS — I10 Essential (primary) hypertension: Secondary | ICD-10-CM

## 2018-06-18 DIAGNOSIS — Z91013 Allergy to seafood: Secondary | ICD-10-CM | POA: Insufficient documentation

## 2018-06-18 LAB — GLUCOSE, POCT (MANUAL RESULT ENTRY): POC GLUCOSE: 90 mg/dL (ref 70–99)

## 2018-06-18 MED ORDER — FUROSEMIDE 20 MG PO TABS: 20.0000 mg | ORAL_TABLET | Freq: Every day | ORAL | 0 refills | Status: DC

## 2018-06-18 MED ORDER — TRAMADOL HCL 50 MG PO TABS: 50.0000 mg | ORAL_TABLET | Freq: Two times a day (BID) | ORAL | 0 refills | Status: DC | PRN

## 2018-06-18 NOTE — Progress Notes (Signed)
Patient ID: TAVI GAUGHRAN, female   DOB: [birth year], 52 y.o.   MRN: [id number]   Taylor Vazquez, is a 52 y.o. female  NKN:[id number]  [id number]  DOB - [birth year]  Subjective:  Chief Complaint and HPI: Taylor Vazquez is a 52 y.o. female here today for B leg swelling.  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  +smokes.  No regular exercise.  This has been occurring for the last several months.  Sleeps on several pillows from comfort-not orthopnea.  No CP.  Has all over body aches/arthritis and requests tramadol for this bc some days tylenol doesn't help.  She has been on Amlodipine for a while   ROS:   Constitutional:  No f/c, No night sweats, No unexplained weight loss. EENT:  No vision changes, No blurry vision, No hearing changes. No mouth, throat, or ear problems.  Respiratory: No cough, No SOB Cardiac: No CP, no palpitations GI:  No abd pain, No N/V/D. GU: No Urinary s/sx Musculoskeletal: +leg pain Neuro: No headache, no dizziness, no motor weakness.  Skin: No rash Endocrine:  No polydipsia. No polyuria.  Psych: Denies SI/HI  No problems updated.  ALLERGIES: Allergies  Allergen Reactions  . Bee Venom Anaphylaxis  . Shellfish Allergy Anaphylaxis and Swelling    PAST MEDICAL HISTORY: Past Medical History:  Diagnosis Date  . Asthma   . CHF (congestive heart failure) ([place])   . Depression   . Hypertension   . Obesity   . Sickle cell trait ([place])   . Stroke [facility])     MEDICATIONS AT HOME: Prior to Admission medications   Medication Sig Start Date End Date Taking? Authorizing Provider  acetaminophen (TYLENOL) 325 MG tablet Take 2 tablets (650 mg total) by mouth every 6 (six) hours as needed for mild pain or moderate pain. Patient not taking: Reported on 09/17/2017 06/10/15   Kinnie Feil, MD  acetaminophen (TYLENOL) 500 MG tablet Take 1,000 mg by mouth every 6 (six) hours as needed for moderate pain.    [provider]    amLODipine (NORVASC) 10 MG tablet Take 1 tablet (10 mg total) by mouth daily. 01/14/18   Charlott Rakes, MD  amoxicillin (AMOXIL) 500 MG capsule Take 2 capsules (1,000 mg total) by mouth 2 (two) times daily. 04/14/18   Ladell Pier, MD  aspirin 325 MG tablet Take 1 tablet (325 mg total) by mouth daily. 09/30/15   Charlott Rakes, MD  atorvastatin (LIPITOR) 20 MG tablet Take 1 tablet (20 mg total) by mouth daily. 01/14/18   Charlott Rakes, MD  clarithromycin (BIAXIN) 500 MG tablet Take 1 tablet (500 mg total) by mouth 2 (two) times daily. Hold off on taking Atorvastatin (your cholesterol medication) while taking this medication. 04/14/18   Ladell Pier, MD  clindamycin (CLEOCIN) 300 MG capsule Take 1 capsule (300 mg total) by mouth 2 (two) times daily. 04/10/18   Charlott Rakes, MD  clotrimazole (LOTRIMIN) 1 % cream Apply to affected area 2 times daily 01/05/17   Fransico Meadow, PA-C  famotidine (PEPCID) 20 MG tablet Take 1 tablet (20 mg total) by mouth 2 (two) times daily. Patient not taking: Reported on 04/10/2018 01/14/18   Charlott Rakes, MD  Fluticasone-Salmeterol (ADVAIR DISKUS) 100-50 MCG/DOSE AEPB INHALE 1 PUFF INTO THE LUNGS 2 TIMES DAILY. 01/14/18   Charlott Rakes, MD  furosemide (LASIX) 20 MG tablet Take 1 tablet (20 mg total) by mouth daily. 06/18/18   Argentina Donovan, PA-C  gabapentin (NEURONTIN) 300 MG capsule Take 2 capsules (600 mg total) by mouth 2 (two) times daily. 01/14/18   Charlott Rakes, MD  ibuprofen (ADVIL,MOTRIN) 800 MG tablet Take 1 tablet (800 mg total) by mouth every 8 (eight) hours as needed. With food 12/04/17   Freeman Caldron M, PA-C  losartan (COZAAR) 25 MG tablet Take 1 tablet (25 mg total) by mouth daily. 01/14/18   Charlott Rakes, MD  Menthol, Topical Analgesic, (BENGAY EX) Apply 1 application topically as needed (for pain).    [provider]  metFORMIN (GLUCOPHAGE) 500 MG tablet Take 1 tablet (500 mg total) by mouth 2 (two) times daily with a  meal. 01/14/18   Charlott Rakes, MD  omeprazole (PRILOSEC) 40 MG capsule Take 1 capsule (40 mg total) by mouth daily. 04/10/18   Charlott Rakes, MD  oxybutynin (DITROPAN) 5 MG tablet Take 1 tablet (5 mg total) by mouth 2 (two) times daily. Patient not taking: Reported on 01/14/2018 09/17/17   Charlott Rakes, MD  topiramate (TOPAMAX) 50 MG tablet Take 1 tablet (50 mg total) by mouth 2 (two) times daily. 01/14/18   Charlott Rakes, MD  traMADol (ULTRAM) 50 MG tablet Take 1 tablet (50 mg total) by mouth every 12 (twelve) hours as needed. 04/10/18   Charlott Rakes, MD  VENTOLIN HFA 108 (90 Base) MCG/ACT inhaler INHALE 2 PUFFS INTO THE LUNGS EVERY 6 (SIX) HOURS AS NEEDED FOR WHEEZING OR SHORTNESS OF BREATH. 03/11/18   Charlott Rakes, MD  zolpidem (AMBIEN) 5 MG tablet Take 1 tablet (5 mg total) by mouth at bedtime as needed for sleep. 09/17/17   Charlott Rakes, MD  lurasidone (LATUDA) 40 MG TABS tablet Take 1 tablet (40 mg total) by mouth daily with breakfast. Patient not taking: Reported on 08/10/2015 06/10/15 08/10/15  Kinnie Feil, MD     Objective:  EXAM:   Vitals:   06/18/18 1520  BP: (!) 141/87  Pulse: 84  Resp: 18  Temp: 98.4 F (36.9 C)  TempSrc: Oral  SpO2: 100%  Weight: 249 lb (112.9 kg)  Height: 5\' 3"  (1.6 m)    General appearance : A&OX3. NAD. Non-toxic-appearing HEENT: Atraumatic and Normocephalic.  PERRLA. EOM intact.   Neck: supple, no JVD. No cervical lymphadenopathy. No thyromegaly Chest/Lungs:  Breathing-non-labored, Good air entry bilaterally, breath sounds normal without rales, rhonchi, or wheezing  CVS: S1 S2 regular, no murmurs, gallops, rubs  Extremities: Bilateral Lower Ext shows mild edema B(<1+), both legs are warm to touch with = pulse throughout Neurology:  CN II-XII grossly intact, Non focal.   Psych:  TP linear. J/I WNL. Normal speech. Appropriate eye contact and affect.  Skin:  No Rash  Data Review Lab Results  Component Value Date   HGBA1C 6.0  01/14/2018   HGBA1C 5.6 04/26/2017   HGBA1C 5.9 (H) 03/13/2016     Assessment & Plan   1. Edema, unspecified type Limit salt/sugar.  Drink plenty of water.  No signs of failure.   - Comprehensive metabolic panel - CBC with Differential/Platelet - TSH - Vitamin D, 25-hydroxy - furosemide (LASIX) 20 MG tablet; Take 1 tablet (20 mg total) by mouth daily.  Dispense: 7 tablet; Refill: 0  2. Prediabetes Glucose is 90 today.   - Glucose (CBG)  3. Essential hypertension Not at goal.  Continue current regimen and check BP OOO 3-5 times/week and record.  Bring to next visit. - Comprehensive metabolic panel - CBC with Differential/Platelet  4. Body aches #15 tramadol given until she can  f/up with PCP   Patient have been counseled extensively about nutrition and exercise  Return for keep 07/09/2018 appt with Dr Margarita Rana.  The patient was given clear instructions to go to ER or return to medical center if symptoms don't improve, worsen or new problems develop. The patient verbalized understanding. The patient was told to call to get lab results if they haven't heard anything in the next week.     Freeman Caldron, PA-C [facility] [place], [place]   06/18/2018, 3:32 PM

## 2018-06-18 NOTE — Patient Instructions (Signed)
Check Blood pressure 3-5 times/week and record and bring to next visit.     Edema Edema is when you have too much fluid in your body or under your skin. Edema may make your legs, feet, and ankles swell up. Swelling is also common in looser tissues, like around your eyes. This is a common condition. It gets more common as you get older. There are many possible causes of edema. Eating too much salt (sodium) and being on your feet or sitting for a long time can cause edema in your legs, feet, and ankles. Hot weather may make edema worse. Edema is usually painless. Your skin may look swollen or shiny. Follow these instructions at home:  Keep the swollen body part raised (elevated) above the level of your heart when you are sitting or lying down.  Do not sit still or stand for a long time.  Do not wear tight clothes. Do not wear garters on your upper legs.  Exercise your legs. This can help the swelling go down.  Wear elastic bandages or support stockings as told by your doctor.  Eat a low-salt (low-sodium) diet to reduce fluid as told by your doctor.  Depending on the cause of your swelling, you may need to limit how much fluid you drink (fluid restriction).  Take over-the-counter and prescription medicines only as told by your doctor. Contact a doctor if:  Treatment is not working.  You have heart, liver, or kidney disease and have symptoms of edema.  You have sudden and unexplained weight gain. Get help right away if:  You have shortness of breath or chest pain.  You cannot breathe when you lie down.  You have pain, redness, or warmth in the swollen areas.  You have heart, liver, or kidney disease and get edema all of a sudden.  You have a fever and your symptoms get worse all of a sudden. Summary  Edema is when you have too much fluid in your body or under your skin.  Edema may make your legs, feet, and ankles swell up. Swelling is also common in looser tissues, like  around your eyes.  Raise (elevate) the swollen body part above the level of your heart when you are sitting or lying down.  Follow your doctor's instructions about diet and how much fluid you can drink (fluid restriction). This information is not intended to replace advice given to you by your health care provider. Make sure you discuss any questions you have with your health care provider. Document Released: 03/26/2008 Document Revised: 10/26/2016 Document Reviewed: 10/26/2016 Elsevier Interactive Patient Education  2017 [employer].

## 2018-06-19 ENCOUNTER — Telehealth: Payer: Self-pay | Admitting: Family Medicine

## 2018-06-19 ENCOUNTER — Other Ambulatory Visit: Payer: Self-pay | Admitting: Physician Assistant

## 2018-06-19 ENCOUNTER — Telehealth: Payer: Self-pay | Admitting: *Deleted

## 2018-06-19 DIAGNOSIS — E559 Vitamin D deficiency, unspecified: Secondary | ICD-10-CM

## 2018-06-19 LAB — CBC WITH DIFFERENTIAL/PLATELET
BASOS ABS: 0 10*3/uL (ref 0.0–0.2)
Basos: 0 %
EOS (ABSOLUTE): 0.2 10*3/uL (ref 0.0–0.4)
Eos: 3 %
Hematocrit: 41.3 % (ref 34.0–46.6)
Hemoglobin: 13.8 g/dL (ref 11.1–15.9)
Immature Grans (Abs): 0 10*3/uL (ref 0.0–0.1)
Immature Granulocytes: 0 %
LYMPHS: 38 %
Lymphocytes Absolute: 2.8 10*3/uL (ref 0.7–3.1)
MCH: 27.4 pg (ref 26.6–33.0)
MCHC: 33.4 g/dL (ref 31.5–35.7)
MCV: 82 fL (ref 79–97)
MONOS ABS: 0.9 10*3/uL (ref 0.1–0.9)
Monocytes: 12 %
Neutrophils Absolute: 3.5 10*3/uL (ref 1.4–7.0)
Neutrophils: 47 %
PLATELETS: 241 10*3/uL (ref 150–450)
RBC: 5.04 x10E6/uL (ref 3.77–5.28)
RDW: 15.7 % — AB (ref 12.3–15.4)
WBC: 7.4 10*3/uL (ref 3.4–10.8)

## 2018-06-19 LAB — COMPREHENSIVE METABOLIC PANEL
A/G RATIO: 1.6 (ref 1.2–2.2)
ALT: 19 IU/L (ref 0–32)
AST: 17 IU/L (ref 0–40)
Albumin: 4.1 g/dL (ref 3.5–5.5)
Alkaline Phosphatase: 146 IU/L — ABNORMAL HIGH (ref 39–117)
BILIRUBIN TOTAL: 0.2 mg/dL (ref 0.0–1.2)
BUN/Creatinine Ratio: 13 (ref 9–23)
BUN: 12 mg/dL (ref 6–24)
CALCIUM: 8.9 mg/dL (ref 8.7–10.2)
CHLORIDE: 105 mmol/L (ref 96–106)
CO2: 24 mmol/L (ref 20–29)
Creatinine, Ser: 0.9 mg/dL (ref 0.57–1.00)
GFR calc non Af Amer: 75 mL/min/{1.73_m2} (ref >59)
GFR, EST AFRICAN AMERICAN: 86 mL/min/{1.73_m2} (ref >59)
GLOBULIN, TOTAL: 2.6 g/dL (ref 1.5–4.5)
Glucose: 92 mg/dL (ref 65–99)
POTASSIUM: 4.4 mmol/L (ref 3.5–5.2)
SODIUM: 141 mmol/L (ref 134–144)
TOTAL PROTEIN: 6.7 g/dL (ref 6.0–8.5)

## 2018-06-19 LAB — TSH: TSH: 2.12 u[IU]/mL (ref 0.450–4.500)

## 2018-06-19 LAB — VITAMIN D 25 HYDROXY (VIT D DEFICIENCY, FRACTURES): Vit D, 25-Hydroxy: 14.2 ng/mL — ABNORMAL LOW (ref 30.0–100.0)

## 2018-06-19 MED ORDER — VITAMIN D (ERGOCALCIFEROL) 1.25 MG (50000 UNIT) PO CAPS: 50000.0000 [IU] | ORAL_CAPSULE | ORAL | 0 refills | Status: DC

## 2018-06-19 NOTE — Telephone Encounter (Signed)
Patient called requesting lab results, verified DOB. Results were given, no questions. Was informed to collect medication from pharmacy.

## 2018-06-19 NOTE — Telephone Encounter (Signed)
Medical Assistant left message on patient's home and cell voicemail. Voicemail states to give a call back to Nubia with [facility] at [phone].  

## 2018-06-19 NOTE — Telephone Encounter (Signed)
-----   Message from Argentina Donovan, [place] sent at 06/19/2018  8:02 AM EDT ----- Please call patient and let her know that her vitamin D is low.  This can contribute to muscle aches, anxiety, fatigue, and depression.  I have sent a prescription to the pharmacy for her to take once a week.  Her alkaline phosphatase is a little elevated. We will recheck both levels level in 3-4 months.  Her kidney, function, electrolytes, liver function, thyroid, and blood count are all normal.  Follow-up as planned.  Thanks, Freeman Caldron, PA-C

## 2018-06-26 MED FILL — traMADol HCL 50 MG TABS: 50 | 7 days supply | Qty: 15 | Fill #0

## 2018-06-27 ENCOUNTER — Ambulatory Visit: Payer: Self-pay

## 2018-07-02 ENCOUNTER — Ambulatory Visit: Payer: Self-pay

## 2018-07-09 ENCOUNTER — Ambulatory Visit: Payer: Self-pay | Admitting: Family Medicine

## 2018-07-24 ENCOUNTER — Ambulatory Visit: Payer: Self-pay | Admitting: Licensed Clinical Social Worker

## 2018-07-24 ENCOUNTER — Ambulatory Visit: Payer: Self-pay | Attending: Family Medicine | Admitting: Physician Assistant

## 2018-07-24 ENCOUNTER — Ambulatory Visit: Payer: Self-pay

## 2018-07-24 ENCOUNTER — Other Ambulatory Visit: Payer: Self-pay

## 2018-07-24 VITALS — BP 154/77 | HR 79 | Temp 97.9°F | Resp 18 | Ht 63.0 in | Wt 245.2 lb

## 2018-07-24 DIAGNOSIS — R7303 Prediabetes: Secondary | ICD-10-CM | POA: Insufficient documentation

## 2018-07-24 DIAGNOSIS — F4321 Adjustment disorder with depressed mood: Secondary | ICD-10-CM

## 2018-07-24 DIAGNOSIS — F4323 Adjustment disorder with mixed anxiety and depressed mood: Secondary | ICD-10-CM

## 2018-07-24 DIAGNOSIS — K047 Periapical abscess without sinus: Secondary | ICD-10-CM | POA: Insufficient documentation

## 2018-07-24 DIAGNOSIS — Z79899 Other long term (current) drug therapy: Secondary | ICD-10-CM | POA: Insufficient documentation

## 2018-07-24 DIAGNOSIS — I509 Heart failure, unspecified: Secondary | ICD-10-CM | POA: Insufficient documentation

## 2018-07-24 DIAGNOSIS — K1379 Other lesions of oral mucosa: Secondary | ICD-10-CM

## 2018-07-24 DIAGNOSIS — Z7982 Long term (current) use of aspirin: Secondary | ICD-10-CM | POA: Insufficient documentation

## 2018-07-24 DIAGNOSIS — I110 Hypertensive heart disease with heart failure: Secondary | ICD-10-CM | POA: Insufficient documentation

## 2018-07-24 DIAGNOSIS — F329 Major depressive disorder, single episode, unspecified: Secondary | ICD-10-CM | POA: Insufficient documentation

## 2018-07-24 DIAGNOSIS — J45909 Unspecified asthma, uncomplicated: Secondary | ICD-10-CM | POA: Insufficient documentation

## 2018-07-24 DIAGNOSIS — D573 Sickle-cell trait: Secondary | ICD-10-CM | POA: Insufficient documentation

## 2018-07-24 DIAGNOSIS — K0889 Other specified disorders of teeth and supporting structures: Secondary | ICD-10-CM | POA: Insufficient documentation

## 2018-07-24 DIAGNOSIS — E669 Obesity, unspecified: Secondary | ICD-10-CM | POA: Insufficient documentation

## 2018-07-24 DIAGNOSIS — Z7984 Long term (current) use of oral hypoglycemic drugs: Secondary | ICD-10-CM | POA: Insufficient documentation

## 2018-07-24 DIAGNOSIS — Z8673 Personal history of transient ischemic attack (TIA), and cerebral infarction without residual deficits: Secondary | ICD-10-CM | POA: Insufficient documentation

## 2018-07-24 DIAGNOSIS — F4320 Adjustment disorder, unspecified: Secondary | ICD-10-CM | POA: Insufficient documentation

## 2018-07-24 LAB — POCT GLYCOSYLATED HEMOGLOBIN (HGB A1C)
HbA1c POC (<> result, manual entry): 5.8 % (ref 4.0–5.6)
HbA1c, POC (controlled diabetic range): 5.8 % (ref 0.0–7.0)
HbA1c, POC (prediabetic range): 5.8 % (ref 5.7–6.4)
Hemoglobin A1C: 5.8 % — AB (ref 4.0–5.6)

## 2018-07-24 LAB — GLUCOSE, POCT (MANUAL RESULT ENTRY): POC Glucose: 97 mg/dl (ref 70–99)

## 2018-07-24 MED ORDER — TRUE METRIX METER W/DEVICE KIT: 1.0000 | PACK | 0 refills | Status: AC

## 2018-07-24 MED ORDER — TRUEPLUS LANCETS 28G MISC: 1.0000 | Freq: Two times a day (BID) | 1 refills | Status: DC

## 2018-07-24 MED ORDER — GLUCOSE BLOOD VI STRP: ORAL_STRIP | 12 refills | Status: DC

## 2018-07-24 MED ORDER — AMOXICILLIN 500 MG PO CAPS: 1000.0000 mg | ORAL_CAPSULE | Freq: Three times a day (TID) | ORAL | 0 refills | Status: DC

## 2018-07-24 MED ORDER — TRAMADOL HCL 50 MG PO TABS: 50.0000 mg | ORAL_TABLET | Freq: Three times a day (TID) | ORAL | 0 refills | Status: DC | PRN

## 2018-07-24 MED ORDER — KETOROLAC TROMETHAMINE 60 MG/2ML IM SOLN
60.0000 mg | Freq: Once | INTRAMUSCULAR | Status: AC
  Administered 2018-07-24: 60 mg via INTRAMUSCULAR

## 2018-07-24 MED ORDER — FLUCONAZOLE 150 MG PO TABS: 150.0000 mg | ORAL_TABLET | Freq: Once | ORAL | 0 refills | Status: AC

## 2018-07-24 MED FILL — FLUCONAZOLE 150 MG TABS: 150 | 1 days supply | Qty: 1 | Fill #0

## 2018-07-24 MED FILL — TRUE METRIX TEST STRIP: 30 days supply | Qty: 100 | Fill #0

## 2018-07-24 MED FILL — TRUEplus LANCETS 28G MISC: 30 days supply | Qty: 100 | Fill #0

## 2018-07-24 MED FILL — !TRUE METRIX BLOOD GLUCOSE: 30 days supply | Qty: 1 | Fill #0

## 2018-07-24 MED FILL — AMOXICILLIN 500 MG CAPSULE: 500 | 5 days supply | Qty: 30 | Fill #0

## 2018-07-24 NOTE — BH Specialist Note (Signed)
Integrated Behavioral Health Initial Visit  MRN: [id number] Name: Taylor Vazquez  Number of [facility] Clinician visits:: 1/6 Session Start time: 11:15 AM  Session End time: 11:45 AM Total time: 30 minutes  Type of Service: [place] Interpretor:No.    Warm Hand Off Completed.       SUBJECTIVE: Taylor Vazquez is a 52 y.o. female accompanied by SELF Patient was referred by PA-C Freeman Caldron for grief counseling and resources. Patient reports the following symptoms/concerns: Pt reports that her son passed away 3 weeks ago from diabetes and she is having trouble dealing with the loss. Pt has had difficulty sleeping and eating due to loss. Pt also reports that she believes she is dealing with depression. Duration of problem: 1 month ; Severity of problem: moderate  OBJECTIVE: Mood: Depressed and Affect: Tearful Risk of harm to self or others: No plan to harm self or others  LIFE CONTEXT: Family and Social: Pt shared that she is very close with her children but they currently do not reside in [place] and her oldest son is in prison. Pt shared that she has sisters but sometimes they cause stress because they want money majority of the time. Pt has a home but is currently staying with a friend to help deal with loss of son.  School/Work: Pt currently is employed full-time.  Self-Care: Pt has a history of substance use. Pt shared she struggled with alcoholism but has not used alcohol in 9 years. Pt shared that she tries to keep herself busy by working. Pt stated that she did not have any other coping skills besides working. Life Changes: Pt's son passed away in the beginning of Sep from diabetes. Pt stated that she has had difficulty dealing with loss and feels anger towards her son because he did not take care of his health.   GOALS ADDRESSED: Patient will: 1. Reduce symptoms of: depression and insomnia 2. Increase  knowledge and/or ability of: coping skills  3. Demonstrate ability to: Begin healthy grieving over loss  INTERVENTIONS: Interventions utilized: Supportive Counseling and Sleep Hygiene  Standardized Assessments completed: GAD-7 and PHQ 9  ASSESSMENT: Patient currently experiencing grief, with mixed anxiety and depressive symptoms. Pt's son passed away from diabetes approximately 3 weeks ago. Pt has been dealing with anger since the loss of her son. Pt shared that she is angry about her son not taking care of his health. Pt believes that if he would have taken care of his health then he would not have passed away. Pt reports that she is close to her children but they currently do not reside in [place]. Pt shared that her older son is in prison but he will be released in November. Pt stated that she does not want to be a burden on her children. Pt shared that she chooses not to associate with her sisters as much because they always want money. Pt has a difficult time disclosing her feelings and emotions to children. Pt shared that she has always been the strong one and that her children should not see her weak. Pt reports that she went to work immediately after her son's death because she had to keep herself busy.   It was difficult to intercede when pt was talking because pt persistently interrupted MSW Intern. However, pt shared that she wanted to talk about her feelings because she never gets the opportunity.   Pt denies any suicidal or homicidal ideations. Pt reports that  she has a hx of depression and was taking medication, but has not had a recent prescription. Pt acknowledged that she is currently going through depression. Pt shared that she has a prescription to assist with sleeping, but it has not been helping her sleep since her son passed away. Pt shared that her appetite has been poor. She reports that she only eats a full meal every 2-3 days and she will eat minimal snacks and drink extra  water in the meantime.    MSW intern recommended that pt try deep breathing exercises and meditation to assist with sleep. MSW Intern provided pt with grief counseling resources and offered [facility] services if pt wants to continue to meet. MSW Intern attempted to normalize pt's feelings of anger by informing pt about stages of grief. MSW Intern also attempted to normalize pt's tearful state by informing her that crying is coping skill when grieving.   PLAN: 1. Follow up with behavioral health clinician on : MSW intern encouraged pt to follow-up if she desires 2. Behavioral recommendations: MSW intern recommended pt try deep breathing exercises, mindful meditation, and grief counseling 3. Referral(s): [facility] (In Clinic) and [facility] (LME/Outside Clinic) 4. "From scale of 1-10, how likely are you to follow plan?": 9  Ruffin Pyo, MSW Intern 07/24/18, 4:15 PM

## 2018-07-24 NOTE — Patient Instructions (Signed)
Call hospice for grief groups/grief support [phone]   Coping With Loss, Adult People experience loss in many different ways throughout their lives. Events such as moving, changing jobs, and losing friends can create a sense of loss. The loss may be as serious as a major health change, divorce, death of a pet, or death of a loved one. All of these types of loss are likely to create a physical and emotional reaction known as grief. Grief is the result of a major change or an absence of something or someone that you count on. Grief is a normal reaction to loss. How to recognize changes A variety of factors can affect your grieving experience, including:  The nature of your loss.  Your relationship to what or whom you lost.  Your understanding of grief and how to cope with it.  Your support system.  The way that you deal with your grief will affect your ability to function as you normally do. When you are grieving, you may experience:  Numbness, shock, sadness, anxiety, anger, denial, and guilt.  Thoughts about death.  Unexpected crying.  A physical sensation of emptiness in your gut.  Problems sleeping and eating.  Fatigue.  Loss of interest in normal activities.  Dreaming about or imagining seeing the person who died.  A need to remember what or whom you lost.  Difficulty thinking about anything other than your loss for a period of time.  Relief. If you have been expecting the loss for a while, you may feel a sense of relief when it happens.  Where to find support To get support for coping with loss:  Ask your health care provider for help and recommendations, such as grief counseling or therapy.  Think about joining a support group for people who are coping with loss.  Follow these instructions at home:  Be patient with yourself and others. Allow the grieving process to happen, and remember that grieving takes time. ? It is likely that you may never feel  completely done with some grief. You may find a way to move on while still cherishing memories and feelings about your loss. ? Accepting your loss is a process. It can take months or longer to adjust.  Express your feelings in healthy ways, such as: ? Talking with others about your loss. It may be helpful to find others who have had a similar loss, such as a support group. ? Writing down your feelings in a journal. ? Doing physical activities to release stress and emotional energy. ? Doing creative activities like painting, sculpting, or playing or listening to music. ? Practicing resilience. This is the ability to recover and adjust after facing challenges. Reading some resources that encourage resilience may help you to learn ways to practice those behaviors.  Keep to your normal routine as much as possible. If you have trouble focusing or doing normal activities, it is acceptable to take some time away from your normal routine.  Spend time with friends and loved ones.  Eat a healthy diet, get plenty of sleep, and rest when you feel tired. Where to find more information: You can find more information about coping with loss from:  American Society of Clinical Oncology: www.cancer.net  American Psychological Association: TVStereos.ch  Contact a health care provider if:  Your grief is extreme and keeps getting worse.  You have ongoing grief that does not improve.  Your body shows symptoms of grief, such as illness.  You feel depressed, anxious, or  lonely. Get help right away if:  You have thoughts about hurting yourself or others. If you ever feel like you may hurt yourself or others, or have thoughts about taking your own life, get help right away. You can go to your nearest emergency department or call:  Your local emergency services (911 in the [place]  A suicide crisis helpline, such as the [place] at 907-750-9259. This is open 24 hours a  day.  Summary  Grief is a normal part of experiencing a loss. It is the result of a major change or an absence of something or someone that you count on.  The depth of grief and the period of recovery depend on the type of loss as well as your ability to adjust to the change and process your feelings.  Processing grief requires patience and a willingness to accept your feelings and talk about your loss with people who are supportive.  It is important to find resources that work for you and to realize that we are all different when it comes to grief. There is not one single grieving process that works for everyone in the same way.  Be aware that when grief becomes extreme, it can lead to more severe issues like isolation, depression, anxiety, or suicidal thoughts. Talk with your health care provider if you have any of these issues. This information is not intended to replace advice given to you by your health care provider. Make sure you discuss any questions you have with your health care provider. Document Released: 02/21/2017 Document Revised: 02/21/2017 Document Reviewed: 02/21/2017 Elsevier Interactive Patient Education  2018 [employer].

## 2018-07-24 NOTE — Progress Notes (Signed)
Patient ID: Taylor Vazquez, female   DOB: [birth year], 52 y.o.   MRN: [id number]   Taylor Vazquez, is a 52 y.o. female  FYB:[id number]  NID:[id number]  DOB - 1966/09/12  Subjective:  Chief Complaint and HPI: Taylor Vazquez is a 52 y.o. female here today with worsening tooth pain.  +fever.  Pain has increased over the last few days.  Also having some pain in R sinus.  Just got orange card and needs to see a dentist for ongoing teeth issues for more than 1 year.  Says pain is moderate to severe  Also, her 35 yr old son died recently and was buried just over a week ago.  She would like to talk with the social worker for this.  She has a good system of support with her family.  No SI/HI.  Dealing with anger and grief with the sadness  ROS:   Constitutional:  No f/c, No night sweats, No unexplained weight loss. EENT:  No vision changes, No blurry vision, No hearing changes. No additional mouth, throat, or ear problems.  Respiratory: No cough, No SOB Cardiac: No CP, no palpitations GI:  No abd pain, No N/V/D. GU: No Urinary s/sx Musculoskeletal: No joint pain Neuro: No headache, no dizziness, no motor weakness.  Skin: No rash Endocrine:  No polydipsia. No polyuria.  Psych: Denies SI/HI  No problems updated.  ALLERGIES: Allergies  Allergen Reactions  . Bee Venom Anaphylaxis  . Shellfish Allergy Anaphylaxis and Swelling    PAST MEDICAL HISTORY: Past Medical History:  Diagnosis Date  . Asthma   . CHF (congestive heart failure) ([place])   . Depression   . Hypertension   . Obesity   . Sickle cell trait ([place])   . Stroke [facility])     MEDICATIONS AT HOME: Prior to Admission medications   Medication Sig Start Date End Date Taking? Authorizing Provider  acetaminophen (TYLENOL) 325 MG tablet Take 2 tablets (650 mg total) by mouth every 6 (six) hours as needed for mild pain or moderate pain. 06/10/15  Yes Buriev, Arie Sabina, MD  acetaminophen (TYLENOL) 500 MG tablet Take 1,000 mg by  mouth every 6 (six) hours as needed for moderate pain.   Yes [provider]  amLODipine (NORVASC) 10 MG tablet Take 1 tablet (10 mg total) by mouth daily. 01/14/18  Yes Charlott Rakes, MD  amoxicillin (AMOXIL) 500 MG capsule Take 2 capsules (1,000 mg total) by mouth 3 (three) times daily. 07/24/18  Yes Argentina Donovan, PA-C  aspirin 325 MG tablet Take 1 tablet (325 mg total) by mouth daily. 09/30/15  Yes Charlott Rakes, MD  atorvastatin (LIPITOR) 20 MG tablet Take 1 tablet (20 mg total) by mouth daily. 01/14/18  Yes Charlott Rakes, MD  clotrimazole (LOTRIMIN) 1 % cream Apply to affected area 2 times daily 01/05/17  Yes Fransico Meadow, PA-C  famotidine (PEPCID) 20 MG tablet Take 1 tablet (20 mg total) by mouth 2 (two) times daily. 01/14/18  Yes Newlin, Charlane Ferretti, MD  Fluticasone-Salmeterol (ADVAIR DISKUS) 100-50 MCG/DOSE AEPB INHALE 1 PUFF INTO THE LUNGS 2 TIMES DAILY. 01/14/18  Yes Charlott Rakes, MD  furosemide (LASIX) 20 MG tablet Take 1 tablet (20 mg total) by mouth daily. 06/18/18  Yes Argentina Donovan, PA-C  gabapentin (NEURONTIN) 300 MG capsule Take 2 capsules (600 mg total) by mouth 2 (two) times daily. 01/14/18  Yes Charlott Rakes, MD  ibuprofen (ADVIL,MOTRIN) 800 MG tablet Take 1 tablet (800 mg total) by mouth every 8 (eight) hours  as needed. With food 12/04/17  Yes Argentina Donovan, PA-C  losartan (COZAAR) 25 MG tablet Take 1 tablet (25 mg total) by mouth daily. 01/14/18  Yes Charlott Rakes, MD  Menthol, Topical Analgesic, (BENGAY EX) Apply 1 application topically as needed (for pain).   Yes [provider]  metFORMIN (GLUCOPHAGE) 500 MG tablet Take 1 tablet (500 mg total) by mouth 2 (two) times daily with a meal. 01/14/18  Yes Newlin, Enobong, MD  omeprazole (PRILOSEC) 40 MG capsule Take 1 capsule (40 mg total) by mouth daily. 04/10/18  Yes Charlott Rakes, MD  oxybutynin (DITROPAN) 5 MG tablet Take 1 tablet (5 mg total) by mouth 2 (two) times daily. 09/17/17  Yes Charlott Rakes, MD  topiramate (TOPAMAX) 50 MG tablet Take 1 tablet (50 mg total) by mouth 2 (two) times daily. 01/14/18  Yes Charlott Rakes, MD  traMADol (ULTRAM) 50 MG tablet Take 1 tablet (50 mg total) by mouth 3 (three) times daily as needed. 07/24/18  Yes Khyrin Trevathan M, PA-C  VENTOLIN HFA 108 (90 Base) MCG/ACT inhaler INHALE 2 PUFFS INTO THE LUNGS EVERY 6 (SIX) HOURS AS NEEDED FOR WHEEZING OR SHORTNESS OF BREATH. 03/11/18  Yes Newlin, Charlane Ferretti, MD  Vitamin D, Ergocalciferol, (DRISDOL) 50000 units CAPS capsule Take 1 capsule (50,000 Units total) by mouth every 7 (seven) days. 06/19/18  Yes Edoardo Laforte, Dionne Bucy, PA-C  zolpidem (AMBIEN) 5 MG tablet Take 1 tablet (5 mg total) by mouth at bedtime as needed for sleep. 09/17/17  Yes Charlott Rakes, MD  fluconazole (DIFLUCAN) 150 MG tablet Take 1 tablet (150 mg total) by mouth once for 1 dose. 07/24/18 07/24/18  Argentina Donovan, PA-C  lurasidone (LATUDA) 40 MG TABS tablet Take 1 tablet (40 mg total) by mouth daily with breakfast. Patient not taking: Reported on 08/10/2015 06/10/15 08/10/15  Kinnie Feil, MD     Objective:  EXAM:   Vitals:   07/24/18 1054  BP: (!) 154/77  Pulse: 79  Resp: 18  Temp: 97.9 F (36.6 C)  TempSrc: Oral  SpO2: 93%  Weight: 245 lb 3.2 oz (111.2 kg)  Height: 5\' 3"  (1.6 m)    General appearance : A&OX3. NAD. Non-toxic-appearing, tearful at times, rocking back and forth in pain HEENT: Atraumatic and Normocephalic.  PERRLA. EOM intact.  TM clear B. Mouth-MMM, post pharynx WNL w/o erythema, No PND.  Poor dentition.  Many broken teeth and dental caries.  Gums red and swollen front and midline at base of lower teeth and one upper tooth on R with erythema at base.  No fluctuant abscess.   Neck: supple, no JVD. No cervical lymphadenopathy. No thyromegaly Chest/Lungs:  Breathing-non-labored, Good air entry bilaterally, breath sounds normal without rales, rhonchi, or wheezing  CVS: S1 S2 regular, no murmurs, gallops, rubs    Extremities: Bilateral Lower Ext shows no edema, both legs are warm to touch with = pulse throughout Neurology:  CN II-XII grossly intact, Non focal.   Psych:  TP linear. J/I WNL. Normal speech. Appropriate eye contact and affect.  Skin:  No Rash  Data Review Lab Results  Component Value Date   HGBA1C 5.8 (A) 07/24/2018   HGBA1C 5.8 07/24/2018   HGBA1C 5.8 07/24/2018   HGBA1C 5.8 07/24/2018     Assessment & Plan   1. Mouth pain Salt water gargles - Ambulatory referral to Dentistry - traMADol (ULTRAM) 50 MG tablet; Take 1 tablet (50 mg total) by mouth 3 (three) times daily as needed.  Dispense: 30 tablet;  Refill: 0 - ketorolac (TORADOL) injection 60 mg  2. Prediabetes Controlled.  Meter ordered per her request.   - Glucose (CBG) - HgB A1c  3. Tooth abscess/mouth infection - Ambulatory referral to Dentistry - amoxicillin (AMOXIL) 500 MG capsule; Take 2 capsules (1,000 mg total) by mouth 3 (three) times daily.  Dispense: 30 capsule; Refill: 0 - fluconazole (DIFLUCAN) 150 MG tablet; Take 1 tablet (150 mg total) by mouth once for 1 dose.  Dispense: 1 tablet; Refill: 0 - traMADol (ULTRAM) 50 MG tablet; Take 1 tablet (50 mg total) by mouth 3 (three) times daily as needed.  Dispense: 30 tablet; Refill: 0  4. Grief reaction I counseled her at length on this.  Self-care advised.  Proper nutrition and water intake.  Hospice info given. Deep breathing.       Patient have been counseled extensively about nutrition and exercise  Return for keep appt in November 2019 with Dr Margarita Rana.  The patient was given clear instructions to go to ER or return to medical center if symptoms don't improve, worsen or new problems develop. The patient verbalized understanding. The patient was told to call to get lab results if they haven't heard anything in the next week.     Freeman Caldron, PA-C [facility] [place], [place]   07/24/2018, 11:04 AM

## 2018-07-24 NOTE — Progress Notes (Signed)
a1c 5.8

## 2018-07-25 MED FILL — traMADol HCL 50 MG TABS: 50 | 10 days supply | Qty: 30 | Fill #0

## 2018-07-26 ENCOUNTER — Emergency Department (HOSPITAL_COMMUNITY)
Admission: EM | Admit: 2018-07-26 | Discharge: 2018-07-26 | Disposition: A | Discharge status: Home / Self Care | Payer: Self-pay | Attending: Emergency Medicine | Admitting: Emergency Medicine

## 2018-07-26 ENCOUNTER — Encounter (HOSPITAL_COMMUNITY): Payer: Self-pay | Admitting: Emergency Medicine

## 2018-07-26 DIAGNOSIS — K0889 Other specified disorders of teeth and supporting structures: Secondary | ICD-10-CM | POA: Insufficient documentation

## 2018-07-26 DIAGNOSIS — Z7982 Long term (current) use of aspirin: Secondary | ICD-10-CM | POA: Insufficient documentation

## 2018-07-26 DIAGNOSIS — I110 Hypertensive heart disease with heart failure: Secondary | ICD-10-CM | POA: Insufficient documentation

## 2018-07-26 DIAGNOSIS — J441 Chronic obstructive pulmonary disease with (acute) exacerbation: Secondary | ICD-10-CM | POA: Insufficient documentation

## 2018-07-26 DIAGNOSIS — I509 Heart failure, unspecified: Secondary | ICD-10-CM | POA: Insufficient documentation

## 2018-07-26 DIAGNOSIS — Z79899 Other long term (current) drug therapy: Secondary | ICD-10-CM | POA: Insufficient documentation

## 2018-07-26 DIAGNOSIS — F17210 Nicotine dependence, cigarettes, uncomplicated: Secondary | ICD-10-CM | POA: Insufficient documentation

## 2018-07-26 MED ORDER — NAPROXEN 500 MG PO TABS: 500.0000 mg | ORAL_TABLET | Freq: Two times a day (BID) | ORAL | 0 refills | Status: DC

## 2018-07-26 MED ORDER — NAPROXEN 250 MG PO TABS
500.0000 mg | ORAL_TABLET | Freq: Once | ORAL | Status: AC
  Administered 2018-07-26: 500 mg via ORAL
  Filled 2018-07-26: qty 2

## 2018-07-26 NOTE — ED Provider Notes (Signed)
[place] EMERGENCY DEPARTMENT Provider Note   CSN: [id number] Arrival date & time: 07/26/18  0308     History   Chief Complaint Chief Complaint  Patient presents with  . Dental Pain    HPI Taylor Vazquez is a 52 y.o. female.  The history is provided by the patient and medical records.  Dental Pain       52 year old female with history of asthma, congestive heart failure, depression, hypertension, obesity, prior stroke, presenting to the ED with diffuse right-sided dental pain.  States this is been ongoing for about a week.  She saw her primary care doctor on Thursday and was started on antibiotics, followed up again yesterday (Friday) and referral was sent for her to see a dentist.  She was prescribed amoxicillin and tramadol which she has been taking without relief.  States she feels like the right side of her face is going to "explode".  Reports fever at home as well as some nausea and vomiting.  States she has had limited solid food intake but has been drinking fluids.  States she has not seen a dentist for routine visit since she was a child.  Past Medical History:  Diagnosis Date  . Asthma   . CHF (congestive heart failure) ([place])   . Depression   . Hypertension   . Obesity   . Sickle cell trait ([place])   . Stroke [facility])     Patient Active Problem List   Diagnosis Date Noted  . Prediabetes 01/14/2018  . Hyperlipidemia LDL goal <100 04/29/2017  . Migraine 04/26/2017  . 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 Left-sided weakness 06/14/2015  . COPD exacerbation ([place]) 06/09/2015  . Diastolic heart failure ([place]) 06/09/2015  . Shortness of breath 06/08/2015  . Chest pain at rest 06/08/2015  . Chest pain 06/08/2015  . Cocaine abuse ([place]) 05/19/2014  . Chest wall pain 05/17/2014  . Tobacco use disorder 05/17/2014  . CVA (cerebral infarction) 05/16/2014  . Asthma, chronic 05/16/2014  . Chest discomfort 05/16/2014  . Bug bite  05/16/2014  . Hypertension 04/28/2012    Past Surgical History:  Procedure Laterality Date  . NO PAST SURGERIES       OB History    Gravida  8   Para  5   Term      Preterm  5   AB  1   Living  4     SAB      TAB  1   Ectopic      Multiple      Live Births               Home Medications    Prior to Admission medications   Medication Sig Start Date End Date Taking? Authorizing Provider  acetaminophen (TYLENOL) 325 MG tablet Take 2 tablets (650 mg total) by mouth every 6 (six) hours as needed for mild pain or moderate pain. 06/10/15   Kinnie Feil, MD  acetaminophen (TYLENOL) 500 MG tablet Take 1,000 mg by mouth every 6 (six) hours as needed for moderate pain.    [provider]  amLODipine (NORVASC) 10 MG tablet Take 1 tablet (10 mg total) by mouth daily. 01/14/18   Charlott Rakes, MD  amoxicillin (AMOXIL) 500 MG capsule Take 2 capsules (1,000 mg total) by mouth 3 (three) times daily. 07/24/18   Argentina Donovan, PA-C  aspirin 325 MG tablet Take 1 tablet (325 mg total) by mouth daily. 09/30/15  Charlott Rakes, MD  atorvastatin (LIPITOR) 20 MG tablet Take 1 tablet (20 mg total) by mouth daily. 01/14/18   Charlott Rakes, MD  clotrimazole (LOTRIMIN) 1 % cream Apply to affected area 2 times daily 01/05/17   Fransico Meadow, PA-C  famotidine (PEPCID) 20 MG tablet Take 1 tablet (20 mg total) by mouth 2 (two) times daily. 01/14/18   Charlott Rakes, MD  Fluticasone-Salmeterol (ADVAIR DISKUS) 100-50 MCG/DOSE AEPB INHALE 1 PUFF INTO THE LUNGS 2 TIMES DAILY. 01/14/18   Charlott Rakes, MD  furosemide (LASIX) 20 MG tablet Take 1 tablet (20 mg total) by mouth daily. 06/18/18   Argentina Donovan, PA-C  gabapentin (NEURONTIN) 300 MG capsule Take 2 capsules (600 mg total) by mouth 2 (two) times daily. 01/14/18   Charlott Rakes, MD  glucose blood (TRUE METRIX BLOOD GLUCOSE TEST) test strip Use as instructed 07/24/18   Argentina Donovan, PA-C  ibuprofen (ADVIL,MOTRIN)  800 MG tablet Take 1 tablet (800 mg total) by mouth every 8 (eight) hours as needed. With food 12/04/17   Freeman Caldron M, PA-C  losartan (COZAAR) 25 MG tablet Take 1 tablet (25 mg total) by mouth daily. 01/14/18   Charlott Rakes, MD  Menthol, Topical Analgesic, (BENGAY EX) Apply 1 application topically as needed (for pain).    [provider]  metFORMIN (GLUCOPHAGE) 500 MG tablet Take 1 tablet (500 mg total) by mouth 2 (two) times daily with a meal. 01/14/18   Charlott Rakes, MD  naproxen (NAPROSYN) 500 MG tablet Take 1 tablet (500 mg total) by mouth 2 (two) times daily with a meal. 07/26/18   Larene Pickett, PA-C  omeprazole (PRILOSEC) 40 MG capsule Take 1 capsule (40 mg total) by mouth daily. 04/10/18   Charlott Rakes, MD  oxybutynin (DITROPAN) 5 MG tablet Take 1 tablet (5 mg total) by mouth 2 (two) times daily. 09/17/17   Charlott Rakes, MD  topiramate (TOPAMAX) 50 MG tablet Take 1 tablet (50 mg total) by mouth 2 (two) times daily. 01/14/18   Charlott Rakes, MD  traMADol (ULTRAM) 50 MG tablet Take 1 tablet (50 mg total) by mouth 3 (three) times daily as needed. 07/24/18   Argentina Donovan, PA-C  TRUEPLUS LANCETS 28G MISC 1 each by Does not apply route 2 (two) times daily. 07/24/18   McClung, Dionne Bucy, PA-C  VENTOLIN HFA 108 (90 Base) MCG/ACT inhaler INHALE 2 PUFFS INTO THE LUNGS EVERY 6 (SIX) HOURS AS NEEDED FOR WHEEZING OR SHORTNESS OF BREATH. 03/11/18   Charlott Rakes, MD  Vitamin D, Ergocalciferol, (DRISDOL) 50000 units CAPS capsule Take 1 capsule (50,000 Units total) by mouth every 7 (seven) days. 06/19/18   Argentina Donovan, PA-C  zolpidem (AMBIEN) 5 MG tablet Take 1 tablet (5 mg total) by mouth at bedtime as needed for sleep. 09/17/17   Charlott Rakes, MD  lurasidone (LATUDA) 40 MG TABS tablet Take 1 tablet (40 mg total) by mouth daily with breakfast. Patient not taking: Reported on 08/10/2015 06/10/15 08/10/15  Kinnie Feil, MD    Family History Family History  Problem  Relation Age of Onset  . Hypertension Mother   . Hypertension Father   . Cancer Father   . Hypertension Other   . Sickle cell trait Other   . Diabetes Other   . Other Neg Hx     Social History Social History   Tobacco Use  . Smoking status: Current Some Day Smoker    Packs/day: 0.25    Types: Cigarettes  .  Smokeless tobacco: Never Used  Substance Use Topics  . Alcohol use: Yes    Alcohol/week: 0.0 standard drinks    Comment: occ  . Drug use: No    Comment: denies crack since 3 or4 months     Allergies   Bee venom and Shellfish allergy   Review of Systems Review of Systems  HENT: Positive for dental problem.   All other systems reviewed and are negative.    Physical Exam Updated Vital Signs BP (!) 166/98   Pulse 83   Temp (!) 97.3 F (36.3 C)   Resp 20   Ht 5\' 3"  (1.6 m)   Wt 107.5 kg   LMP  (LMP Unknown)   SpO2 90%   BMI 41.98 kg/m   Physical Exam  Constitutional: She is oriented to person, place, and time. She appears well-developed and well-nourished.  HENT:  Head: Normocephalic and atraumatic.  Mouth/Throat: Oropharynx is clear and moist.  Teeth largely in very poor dentition, generalized decay, multiple teeth absent, some evidence of gingivitis noted, no discrete abscess or drainable fluid collection, handling secretions appropriately, no trismus, no facial or neck swelling, normal phonation without stridor  Eyes: Pupils are equal, round, and reactive to light. Conjunctivae and EOM are normal.  Neck: Normal range of motion.  Cardiovascular: Normal rate, regular rhythm and normal heart sounds.  Pulmonary/Chest: Effort normal and breath sounds normal.  Abdominal: Soft. Bowel sounds are normal.  Musculoskeletal: Normal range of motion.  Neurological: She is alert and oriented to person, place, and time.  Skin: Skin is warm and dry.  Psychiatric: She has a normal mood and affect.  Nursing note and vitals reviewed.    ED Treatments / Results    Labs (all labs ordered are listed, but only abnormal results are displayed) Labs Reviewed - No data to display  EKG None  Radiology No results found.  Procedures Procedures (including critical care time)  Medications Ordered in ED Medications  naproxen (NAPROSYN) tablet 500 mg (500 mg Oral Given 07/26/18 0429)     Initial Impression / Assessment and Plan / ED Course  I have reviewed the triage vital signs and the nursing notes.  Pertinent labs & imaging results that were available during my care of the patient were reviewed by me and considered in my medical decision making (see chart for details).  52 y.o. F here with dental pain.  Patient reports ongoing for about 6-7 days, has seen PCP x2, started on tramadol and amoxicillin and referred to dentist.  She is afebrile, non-toxic.  Diffusely poor dentition throughout right side of mouth, however no definitive abscess or drainable fluid collection at this time.  No facial or neck swelling noted on exam.  She is handling secretions well, normal phonation without stridor.  Clinically no signs or symptoms suggestive of Ludwig's angina.  Will add anti-inflammatories to her medication regimen (recent SrCr was WNL).  Given information for dentist on call to help establish follow-up sooner if possible.  She will also follow-up with her primary care doctor.  Return here for any new or worsening symptoms.  Final Clinical Impressions(s) / ED Diagnoses   Final diagnoses:  Pain, dental    ED Discharge Orders         Ordered    naproxen (NAPROSYN) 500 MG tablet  2 times daily with meals     07/26/18 0425           Larene Pickett, PA-C 07/26/18 1884    [place],  Nathaneil Canary, MD 07/26/18 [phone]

## 2018-07-26 NOTE — ED Notes (Signed)
Patient left at this time with all belongings. 

## 2018-07-26 NOTE — ED Triage Notes (Signed)
Pt arrives via EMS from home with c/o of ongoing dental pain and swelling, seen by PCP x2 for same. Reports increased pain  Over the weekend. Hypertensive with EMS 218/130. EMS gave 162mcg fentanyl

## 2018-07-26 NOTE — ED Notes (Signed)
ED Provider at bedside. 

## 2018-07-26 NOTE — Discharge Instructions (Addendum)
Take the prescribed medication as directed. Continue your antibiotics. Dr. Radford Pax is our dentist on call-- you can try to see if his office can see you sooner than other dentist. Return to the ED for new or worsening symptoms.

## 2018-09-02 MED FILL — LOSARTAN POTASSIUM 25 MG TA: 25 | 30 days supply | Qty: 30 | Fill #2

## 2018-09-02 MED FILL — AMLODIPINE BESYLATE 10 MG T: 10 | 30 days supply | Qty: 30 | Fill #2

## 2018-09-03 ENCOUNTER — Ambulatory Visit: Payer: Self-pay | Attending: Family Medicine | Admitting: *Deleted

## 2018-09-03 DIAGNOSIS — Z111 Encounter for screening for respiratory tuberculosis: Secondary | ICD-10-CM

## 2018-09-03 MED FILL — CLINDAMYCIN HCL 300 MG CAP: 300 | 14 days supply | Qty: 42 | Fill #0

## 2018-09-03 MED FILL — metroNIDAZOLE 250 MG TABS: 250 | 14 days supply | Qty: 42 | Fill #0

## 2018-09-03 NOTE — Progress Notes (Signed)
PPD Placement note Taylor Vazquez, 52 y.o. female is here today for placement of PPD test Reason for PPD test: employment  Pt taken PPD test before: no Verified in allergy area and with patient that they are not allergic to the products PPD is made of (Phenol or Tween). Yes Is patient taking any oral or IV steroid medication now or have they taken it in the last month? no Has the patient ever received the BCG vaccine?: no Has the patient been in recent contact with anyone known or suspected of having active TB disease?: no   PPD placed on 09/03/2018.  Patient advised to return for reading on Friday. Informed that [facility] would be closed on Saturday.

## 2018-09-05 LAB — TB SKIN TEST
INDURATION: 0 mm
TB Skin Test: NEGATIVE

## 2018-09-07 ENCOUNTER — Emergency Department (HOSPITAL_COMMUNITY): Payer: Self-pay

## 2018-09-07 ENCOUNTER — Encounter (HOSPITAL_COMMUNITY): Payer: Self-pay

## 2018-09-07 ENCOUNTER — Emergency Department (HOSPITAL_COMMUNITY)
Admission: EM | Admit: 2018-09-07 | Discharge: 2018-09-08 | Disposition: A | Discharge status: Home / Self Care | Payer: Self-pay | Attending: Emergency Medicine | Admitting: Emergency Medicine

## 2018-09-07 ENCOUNTER — Other Ambulatory Visit: Payer: Self-pay

## 2018-09-07 DIAGNOSIS — R079 Chest pain, unspecified: Secondary | ICD-10-CM | POA: Insufficient documentation

## 2018-09-07 DIAGNOSIS — I110 Hypertensive heart disease with heart failure: Secondary | ICD-10-CM | POA: Insufficient documentation

## 2018-09-07 DIAGNOSIS — R42 Dizziness and giddiness: Secondary | ICD-10-CM | POA: Insufficient documentation

## 2018-09-07 DIAGNOSIS — F17210 Nicotine dependence, cigarettes, uncomplicated: Secondary | ICD-10-CM | POA: Insufficient documentation

## 2018-09-07 DIAGNOSIS — K0889 Other specified disorders of teeth and supporting structures: Secondary | ICD-10-CM | POA: Insufficient documentation

## 2018-09-07 DIAGNOSIS — J45909 Unspecified asthma, uncomplicated: Secondary | ICD-10-CM | POA: Insufficient documentation

## 2018-09-07 DIAGNOSIS — I509 Heart failure, unspecified: Secondary | ICD-10-CM | POA: Insufficient documentation

## 2018-09-07 LAB — CBC
HCT: 43.2 % (ref 36.0–46.0)
HEMOGLOBIN: 13.6 g/dL (ref 12.0–15.0)
MCH: 27.4 pg (ref 26.0–34.0)
MCHC: 31.5 g/dL (ref 30.0–36.0)
MCV: 86.9 fL (ref 80.0–100.0)
NRBC: 0 % (ref 0.0–0.2)
PLATELETS: 225 10*3/uL (ref 150–400)
RBC: 4.97 MIL/uL (ref 3.87–5.11)
RDW: 15.1 % (ref 11.5–15.5)
WBC: 8.4 10*3/uL (ref 4.0–10.5)

## 2018-09-07 LAB — BASIC METABOLIC PANEL
ANION GAP: 7 (ref 5–15)
BUN: 12 mg/dL (ref 6–20)
CHLORIDE: 104 mmol/L (ref 98–111)
CO2: 18 mmol/L — AB (ref 22–32)
Calcium: 8.6 mg/dL — ABNORMAL LOW (ref 8.9–10.3)
Creatinine, Ser: 0.79 mg/dL (ref 0.44–1.00)
GFR calc Af Amer: >60 mL/min (ref >60)
GLUCOSE: 99 mg/dL (ref 70–99)
POTASSIUM: 3.1 mmol/L — AB (ref 3.5–5.1)
Sodium: 129 mmol/L — ABNORMAL LOW (ref 135–145)

## 2018-09-07 LAB — I-STAT TROPONIN, ED: Troponin i, poc: 0.01 ng/mL (ref 0.00–0.08)

## 2018-09-07 MED ORDER — SODIUM CHLORIDE 0.9 % IV BOLUS
500.0000 mL | Freq: Once | INTRAVENOUS | Status: AC
  Administered 2018-09-08: 500 mL via INTRAVENOUS

## 2018-09-07 MED ORDER — IOPAMIDOL (ISOVUE-370) INJECTION 76%
INTRAVENOUS | Status: AC
  Filled 2018-09-07: qty 100

## 2018-09-07 MED ORDER — POTASSIUM CHLORIDE 10 MEQ/100ML IV SOLN
10.0000 meq | INTRAVENOUS | Status: DC
  Administered 2018-09-08: 10 meq via INTRAVENOUS
  Filled 2018-09-07: qty 100

## 2018-09-07 MED ORDER — IOPAMIDOL (ISOVUE-370) INJECTION 76%
125.0000 mL | Freq: Once | INTRAVENOUS | Status: AC | PRN
  Administered 2018-09-07: 100 mL via INTRAVENOUS

## 2018-09-07 MED ORDER — OXYCODONE HCL 5 MG PO TABS: 5.0000 mg | ORAL_TABLET | Freq: Once | ORAL | Status: DC

## 2018-09-07 MED ORDER — HYDROMORPHONE HCL 1 MG/ML IJ SOLN
0.5000 mg | Freq: Once | INTRAMUSCULAR | Status: AC
  Administered 2018-09-07: 0.5 mg via INTRAVENOUS
  Filled 2018-09-07: qty 1

## 2018-09-07 MED ORDER — IOPAMIDOL (ISOVUE-370) INJECTION 76%
INTRAVENOUS | Status: AC
  Filled 2018-09-07: qty 50

## 2018-09-07 MED ORDER — ASPIRIN 81 MG PO CHEW: 324.0000 mg | CHEWABLE_TABLET | Freq: Once | ORAL | Status: DC

## 2018-09-07 NOTE — ED Provider Notes (Signed)
[facility] Emergency Department Provider Note MRN:  [id number]  Arrival date & time: 09/08/18     Chief Complaint   Dental Pain   History of Present Illness   Taylor Vazquez is a 52 y.o. year-old female with a history of CHF, hypertension presenting to the ED with chief complaint of dental pain.  Patient explains that she is been suffering with tooth infections for several months, has had multiple ED visits for pain.  She was able to see a dentist recently, who did x-rays but unfortunately will be able to remove her teeth until February.  She is currently on antibiotics for her tooth infection, but her teeth continue to cause her pain.  Severe constant pain, worse today.  Patient also explains that she has been having intermittent central sharp chest pain for 3 days.  Not related to exertion.  Associated with intermittent lightheadedness.  Denies nausea or vomiting, no shortness of breath.  No leg pain or swelling.  Review of Systems  A complete 10 system review of systems was obtained and all systems are negative except as noted in the HPI and PMH.   Patient's Health History    Past Medical History:  Diagnosis Date  . Asthma   . CHF (congestive heart failure) ([place])   . Depression   . Hypertension   . Obesity   . Sickle cell trait ([place])   . Stroke [facility])     Past Surgical History:  Procedure Laterality Date  . NO PAST SURGERIES      Family History  Problem Relation Age of Onset  . Hypertension Mother   . Hypertension Father   . Cancer Father   . Hypertension Other   . Sickle cell trait Other   . Diabetes Other   . Other Neg Hx     Social History   Socioeconomic History  . Marital status: Single    Spouse name: Not on file  . Number of children: Not on file  . Years of education: Not on file  . Highest education level: Not on file  Occupational History  . Not on file  Social Needs  . Financial resource strain: Not on file  . Food  insecurity:    Worry: Not on file    Inability: Not on file  . Transportation needs:    Medical: Not on file    Non-medical: Not on file  Tobacco Use  . Smoking status: Current Some Day Smoker    Packs/day: 0.25    Types: Cigarettes  . Smokeless tobacco: Never Used  Substance and Sexual Activity  . Alcohol use: Not Currently    Alcohol/week: 0.0 standard drinks    Comment: occ  . Drug use: No    Comment: denies crack since 3 or4 months  . Sexual activity: Yes    Birth control/protection: None  Lifestyle  . Physical activity:    Days per week: Not on file    Minutes per session: Not on file  . Stress: Not on file  Relationships  . Social connections:    Talks on phone: Not on file    Gets together: Not on file    Attends religious service: Not on file    Active member of club or organization: Not on file    Attends meetings of clubs or organizations: Not on file    Relationship status: Not on file  . Intimate partner violence:    Fear of current or ex partner: Not  on file    Emotionally abused: Not on file    Physically abused: Not on file    Forced sexual activity: Not on file  Other Topics Concern  . Not on file  Social History Narrative  . Not on file     Physical Exam  Vital Signs and Nursing Notes reviewed Vitals:   09/07/18 2230 09/07/18 2300  BP: 134/76 132/77  Pulse: (!) 52 85  Resp: 17 19  Temp:    SpO2: 99% 98%    CONSTITUTIONAL: Chronically ill-appearing, NAD NEURO:  Alert and oriented x 3, no focal deficits EYES:  eyes equal and reactive ENT/NECK:  no LAD, no JVD; poor dentition with multiple dental caries, no gingival abscess, no significant tenderness to palpation. CARDIO: Regular rate, well-perfused, normal S1 and S2 PULM:  CTAB no wheezing or rhonchi GI/GU:  normal bowel sounds, non-distended, non-tender MSK/SPINE:  No gross deformities, no edema SKIN:  no rash, atraumatic PSYCH:  Appropriate speech and behavior  Diagnostic and  Interventional Summary    EKG Interpretation  Date/Time:  Sunday September 07 2018 22:17:26 EST Ventricular Rate:  64 PR Interval:    QRS Duration: 83 QT Interval:  438 QTC Calculation: 452 R Axis:   -9 Text Interpretation:  Sinus rhythm Anterior infarct, old Confirmed by Gerlene Fee [phone]) on 09/07/2018 10:23:18 PM      Labs Reviewed  BASIC METABOLIC PANEL - Abnormal; Notable for the following components:      Result Value   Sodium 129 (*)    Potassium 3.1 (*)    CO2 18 (*)    Calcium 8.6 (*)    All other components within normal limits  CBC  URINALYSIS, ROUTINE W REFLEX MICROSCOPIC  I-STAT TROPONIN, ED  I-STAT TROPONIN, ED    CT Angio Chest/Abd/Pel for Dissection W and/or Wo Contrast  Final Result    CT Head Wo Contrast  Final Result    DG Chest Port 1 View  Final Result    MR BRAIN WO CONTRAST    (Results Pending)    Medications  iopamidol (ISOVUE-370) 76 % injection (has no administration in time range)  iopamidol (ISOVUE-370) 76 % injection (has no administration in time range)  sodium chloride 0.9 % bolus 500 mL (500 mLs Intravenous New Bag/Given 09/08/18 0021)  potassium chloride 10 mEq in 100 mL IVPB (10 mEq Intravenous New Bag/Given 09/08/18 0023)  HYDROmorphone (DILAUDID) injection 0.5 mg (0.5 mg Intravenous Given 09/07/18 2301)  iopamidol (ISOVUE-370) 76 % injection 125 mL (100 mLs Intravenous Contrast Given 09/07/18 2334)     Procedures Critical Care  ED Course and Medical Decision Making  I have reviewed the triage vital signs and the nursing notes.  Pertinent labs & imaging results that were available during my care of the patient were reviewed by me and considered in my medical decision making (see below for details).  Chronic dental pain in this 52 year old female with multiple dental caries, frequent tooth infections.  Also endorsing atypical sharp chest pain for 3 days.  Has had admissions in the past for atypical chest pain described  similarly, negative stress testing at that time.  Still considering cardiac etiology today, will screen with EKG and troponin.  Unclear solution the patient's chronic dental pain, will consider change to antibiotics and/or dental block.  Patient does not have any facial swelling or edema, no lymphadenopathy, no airway compromise, nothing to suggest significant or spreading odontogenic infection.  Clinical Course as of Sep 09 27  Sun Sep 07, 2018  2250 Neurological exam reveals mild to moderate left-sided weakness.  Patient explains that she has had some residual weakness from her stroke years ago, but explains that her weakness became worse 3 days ago when the chest pain started.  Multiple repeated neurological exams, objective weakness, patient is adamant that it is worse than her baseline.  This raises the concern for new or worsening stroke or possibly aortic dissection.   [MB]  2251 CT head Noncon ordered as well as CTA dissection protocol.  If unremarkable, patient will still need MRI of the brain to exclude new or worsening stroke.   [MB]    Clinical Course User Index [MB] Maudie Flakes, MD     CTA head and CTA chest unrevealing.  MRI ordered, case discussed with neurology who agrees with MRI to evaluate for new or worsening stroke.  Per recs from Dr. Leonel Ramsay, if unremarkable would be appropriate for discharge and attempting to treat any conditions that could be contributing to reactivation syndrome, such as her worsening tooth pain and/or infection.  Will consider broadening or changing her antibiotics for her teeth.  Send it to Dr. Wyvonnia Dusky at shift change.  Barth Kirks. Sedonia Small, [place] [email or web address]  Final Clinical Impressions(s) / ED Diagnoses     ICD-10-CM   1. Toothache K08.89   2. Chest pain R07.9 DG Chest [facility] 1 View    DG Chest [place] 1 View    ED Discharge Orders    None         Maudie Flakes, MD 09/08/18  Shelah Lewandowsky

## 2018-09-07 NOTE — ED Triage Notes (Signed)
Per [facility], pt from home w/ a c/o a severe headache, chest pain, dental pain, fluttering in the chest, and dizziness. The chest pain and headache has been ongoing for three days. Dental pain for 3 months. Pt is taking Clindamycin for her tooth. She was given 100 mcg of Fentanyl during transport.

## 2018-09-08 ENCOUNTER — Encounter (HOSPITAL_COMMUNITY): Payer: Self-pay | Admitting: Emergency Medicine

## 2018-09-08 ENCOUNTER — Emergency Department (HOSPITAL_COMMUNITY): Payer: Self-pay

## 2018-09-08 ENCOUNTER — Emergency Department (HOSPITAL_COMMUNITY)
Admission: EM | Admit: 2018-09-08 | Discharge: 2018-09-08 | Disposition: A | Discharge status: Home / Self Care | Payer: Self-pay | Attending: Emergency Medicine | Admitting: Emergency Medicine

## 2018-09-08 DIAGNOSIS — J45909 Unspecified asthma, uncomplicated: Secondary | ICD-10-CM | POA: Insufficient documentation

## 2018-09-08 DIAGNOSIS — J019 Acute sinusitis, unspecified: Secondary | ICD-10-CM | POA: Insufficient documentation

## 2018-09-08 DIAGNOSIS — F17210 Nicotine dependence, cigarettes, uncomplicated: Secondary | ICD-10-CM | POA: Insufficient documentation

## 2018-09-08 DIAGNOSIS — Z7984 Long term (current) use of oral hypoglycemic drugs: Secondary | ICD-10-CM | POA: Insufficient documentation

## 2018-09-08 DIAGNOSIS — I110 Hypertensive heart disease with heart failure: Secondary | ICD-10-CM | POA: Insufficient documentation

## 2018-09-08 DIAGNOSIS — K0889 Other specified disorders of teeth and supporting structures: Secondary | ICD-10-CM | POA: Insufficient documentation

## 2018-09-08 DIAGNOSIS — I5030 Unspecified diastolic (congestive) heart failure: Secondary | ICD-10-CM | POA: Insufficient documentation

## 2018-09-08 DIAGNOSIS — Z79899 Other long term (current) drug therapy: Secondary | ICD-10-CM | POA: Insufficient documentation

## 2018-09-08 LAB — URINALYSIS, ROUTINE W REFLEX MICROSCOPIC
BILIRUBIN URINE: NEGATIVE
Bacteria, UA: NONE SEEN
Glucose, UA: NEGATIVE mg/dL
KETONES UR: NEGATIVE mg/dL
LEUKOCYTES UA: NEGATIVE
NITRITE: NEGATIVE
PH: 6 (ref 5.0–8.0)
Protein, ur: NEGATIVE mg/dL
Specific Gravity, Urine: 1.002 — ABNORMAL LOW (ref 1.005–1.030)

## 2018-09-08 LAB — I-STAT TROPONIN, ED: TROPONIN I, POC: 0 ng/mL (ref 0.00–0.08)

## 2018-09-08 MED ORDER — KETOROLAC TROMETHAMINE 30 MG/ML IJ SOLN
30.0000 mg | Freq: Once | INTRAMUSCULAR | Status: AC
  Administered 2018-09-08: 30 mg via INTRAMUSCULAR
  Filled 2018-09-08: qty 1

## 2018-09-08 MED ORDER — ACETAMINOPHEN 500 MG PO TABS
1000.0000 mg | ORAL_TABLET | Freq: Once | ORAL | Status: AC
  Administered 2018-09-08: 1000 mg via ORAL
  Filled 2018-09-08: qty 2

## 2018-09-08 MED ORDER — HALOPERIDOL LACTATE 5 MG/ML IJ SOLN: 5.0000 mg | Freq: Once | INTRAMUSCULAR | Status: DC

## 2018-09-08 MED ORDER — LORAZEPAM 2 MG/ML IJ SOLN
1.0000 mg | Freq: Once | INTRAMUSCULAR | Status: AC
  Administered 2018-09-08: 1 mg via INTRAVENOUS
  Filled 2018-09-08: qty 1

## 2018-09-08 MED ORDER — MELOXICAM 15 MG PO TABS: 15.0000 mg | ORAL_TABLET | Freq: Every day | ORAL | 0 refills | Status: DC

## 2018-09-08 MED ORDER — AMOXICILLIN-POT CLAVULANATE 875-125 MG PO TABS: 1.0000 | ORAL_TABLET | Freq: Two times a day (BID) | ORAL | 0 refills | Status: DC

## 2018-09-08 MED ORDER — TRAMADOL HCL 50 MG PO TABS: 50.0000 mg | ORAL_TABLET | Freq: Four times a day (QID) | ORAL | 0 refills | Status: DC | PRN

## 2018-09-08 MED ORDER — LORAZEPAM 2 MG/ML IJ SOLN
1.0000 mg | Freq: Once | INTRAMUSCULAR | Status: AC | PRN
  Administered 2018-09-08: 1 mg via INTRAVENOUS
  Filled 2018-09-08: qty 1

## 2018-09-08 NOTE — ED Notes (Signed)
Pt brought back from MRI again, states she unable to complete scan. Dr Wyvonnia Dusky at bedside. Order placed for 5mg  Haldol, will attempt MRI for 3rd time.

## 2018-09-08 NOTE — ED Notes (Signed)
Harris, PA at bedside discussing plan of care and that the pt must have a MRI or leave AMA, pt agrees to take Tylenol and Ativan prior to MRI, MRI notified per PA request

## 2018-09-08 NOTE — ED Notes (Signed)
Pt in c/o lower dental pain and R upper dental pain, pt states, "I called my dentist and they are going to get me in on the 25th of November."

## 2018-09-08 NOTE — ED Provider Notes (Signed)
[facility] EMERGENCY DEPARTMENT Provider Note   CSN: [id number] Arrival date & time: 09/08/18  0542     History   Chief Complaint Chief Complaint  Patient presents with  . Stroke Like Symptoms    HPI Taylor Vazquez is a 52 y.o. female originally presented to the emergency department for tooth pain.  During her visit she began complaining of chest pain, lightheadedness.  She was assessed with a dissection study which was negative however she was complaining of left sided weakness which was worse than normal.  Patient was here in the emergency department awaiting MRI study as recommended by Dr. Leonel Ramsay however left earlier [facility] because she wanted to eat breakfast even though she was offered food and a meal tray.  The patient read presented wanting to continue her study.  She has no changes in her previous symptoms.  HPI  Past Medical History:  Diagnosis Date  . Asthma   . CHF (congestive heart failure) ([place])   . Depression   . Hypertension   . Obesity   . Sickle cell trait ([place])   . Stroke [facility])     Patient Active Problem List   Diagnosis Date Noted  . Prediabetes 01/14/2018  . Hyperlipidemia LDL goal <100 04/29/2017  . Migraine 04/26/2017  . 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 Left-sided weakness 06/14/2015  . COPD exacerbation ([place]) 06/09/2015  . Diastolic heart failure ([place]) 06/09/2015  . Shortness of breath 06/08/2015  . Chest pain at rest 06/08/2015  . Chest pain 06/08/2015  . Cocaine abuse ([place]) 05/19/2014  . Chest wall pain 05/17/2014  . Tobacco use disorder 05/17/2014  . CVA (cerebral infarction) 05/16/2014  . Asthma, chronic 05/16/2014  . Chest discomfort 05/16/2014  . Bug bite 05/16/2014  . Hypertension 04/28/2012    Past Surgical History:  Procedure Laterality Date  . NO PAST SURGERIES       OB History    Gravida  8   Para  5   Term      Preterm  5   AB  1   Living  4     SAB      TAB  1   Ectopic      Multiple      Live Births               Home Medications    Prior to Admission medications   Medication Sig Start Date End Date Taking? Authorizing Provider  acetaminophen (TYLENOL) 500 MG tablet Take 1,000 mg by mouth every 6 (six) hours as needed for moderate pain.   Yes [provider]  amLODipine (NORVASC) 10 MG tablet Take 1 tablet (10 mg total) by mouth daily. 01/14/18  Yes Charlott Rakes, MD  atorvastatin (LIPITOR) 20 MG tablet Take 1 tablet (20 mg total) by mouth daily. 01/14/18  Yes Charlott Rakes, MD  clindamycin (CLEOCIN) 300 MG capsule Take 300 mg by mouth 3 (three) times daily. 09/03/18  Yes [provider]  famotidine (PEPCID) 20 MG tablet Take 1 tablet (20 mg total) by mouth 2 (two) times daily. 01/14/18  Yes Newlin, Charlane Ferretti, MD  Fluticasone-Salmeterol (ADVAIR DISKUS) 100-50 MCG/DOSE AEPB INHALE 1 PUFF INTO THE LUNGS 2 TIMES DAILY. Patient taking differently: Inhale 1 puff into the lungs 2 (two) times daily.  01/14/18  Yes Charlott Rakes, MD  furosemide (LASIX) 20 MG tablet Take 1 tablet (20 mg total) by mouth daily. 06/18/18  Yes Argentina Donovan, PA-C  ibuprofen (ADVIL,MOTRIN) 800 MG tablet Take 1 tablet (800 mg total) by mouth every 8 (eight) hours as needed. With food 12/04/17  Yes Argentina Donovan, PA-C  losartan (COZAAR) 25 MG tablet Take 1 tablet (25 mg total) by mouth daily. 01/14/18  Yes Charlott Rakes, MD  Menthol, Topical Analgesic, (BENGAY EX) Apply 1 application topically as needed (for pain).   Yes [provider]  metFORMIN (GLUCOPHAGE) 500 MG tablet Take 1 tablet (500 mg total) by mouth 2 (two) times daily with a meal. 01/14/18  Yes Newlin, Enobong, MD  metroNIDAZOLE (FLAGYL) 250 MG tablet Take 250 mg by mouth 3 (three) times daily. 09/03/18  Yes [provider]  naproxen (NAPROSYN) 500 MG tablet Take 1 tablet (500 mg total) by mouth 2 (two) times daily with a meal. 07/26/18  Yes Larene Pickett, PA-C    omeprazole (PRILOSEC) 40 MG capsule Take 1 capsule (40 mg total) by mouth daily. 04/10/18  Yes Charlott Rakes, MD  topiramate (TOPAMAX) 50 MG tablet Take 1 tablet (50 mg total) by mouth 2 (two) times daily. 01/14/18  Yes Newlin, Enobong, MD  VENTOLIN HFA 108 (90 Base) MCG/ACT inhaler INHALE 2 PUFFS INTO THE LUNGS EVERY 6 (SIX) HOURS AS NEEDED FOR WHEEZING OR SHORTNESS OF BREATH. Patient taking differently: Inhale 2 puffs into the lungs every 6 (six) hours as needed for wheezing.  03/11/18  Yes Charlott Rakes, MD  acetaminophen (TYLENOL) 325 MG tablet Take 2 tablets (650 mg total) by mouth every 6 (six) hours as needed for mild pain or moderate pain. Patient not taking: Reported on 09/07/2018 06/10/15   Kinnie Feil, MD  amoxicillin (AMOXIL) 500 MG capsule Take 2 capsules (1,000 mg total) by mouth 3 (three) times daily. Patient not taking: Reported on 09/07/2018 07/24/18   Argentina Donovan, PA-C  aspirin 325 MG tablet Take 1 tablet (325 mg total) by mouth daily. Patient not taking: Reported on 09/07/2018 09/30/15   Charlott Rakes, MD  clotrimazole (LOTRIMIN) 1 % cream Apply to affected area 2 times daily Patient not taking: Reported on 09/07/2018 01/05/17   Fransico Meadow, PA-C  gabapentin (NEURONTIN) 300 MG capsule Take 2 capsules (600 mg total) by mouth 2 (two) times daily. Patient not taking: Reported on 09/07/2018 01/14/18   Charlott Rakes, MD  glucose blood (TRUE METRIX BLOOD GLUCOSE TEST) test strip Use as instructed 07/24/18   Argentina Donovan, PA-C  oxybutynin (DITROPAN) 5 MG tablet Take 1 tablet (5 mg total) by mouth 2 (two) times daily. Patient not taking: Reported on 09/07/2018 09/17/17   Charlott Rakes, MD  traMADol (ULTRAM) 50 MG tablet Take 1 tablet (50 mg total) by mouth 3 (three) times daily as needed. Patient not taking: Reported on 09/07/2018 07/24/18   Argentina Donovan, PA-C  TRUEPLUS LANCETS 28G MISC 1 each by Does not apply route 2 (two) times daily. 07/24/18   Argentina Donovan, PA-C  Vitamin D, Ergocalciferol, (DRISDOL) 50000 units CAPS capsule Take 1 capsule (50,000 Units total) by mouth every 7 (seven) days. Patient not taking: Reported on 09/07/2018 06/19/18   Argentina Donovan, PA-C  zolpidem (AMBIEN) 5 MG tablet Take 1 tablet (5 mg total) by mouth at bedtime as needed for sleep. Patient not taking: Reported on 09/07/2018 09/17/17   Charlott Rakes, MD    Family History Family History  Problem Relation Age of Onset  . Hypertension Mother   . Hypertension Father   . Cancer Father   . Hypertension Other   .  Sickle cell trait Other   . Diabetes Other   . Other Neg Hx     Social History Social History   Tobacco Use  . Smoking status: Current Some Day Smoker    Packs/day: 0.25    Types: Cigarettes  . Smokeless tobacco: Never Used  Substance Use Topics  . Alcohol use: Not Currently    Alcohol/week: 0.0 standard drinks    Comment: occ  . Drug use: No    Comment: denies crack since 3 or4 months     Allergies   Bee venom and Shellfish allergy   Review of Systems Review of Systems Ten systems reviewed and are negative for acute change, except as noted in the HPI.    Physical Exam Updated Vital Signs BP 137/81 (BP Location: Right Arm)   Pulse 81   Temp 98.3 F (36.8 C) (Oral)   Resp 18   Ht 5\' 3"  (1.6 m)   Wt 107.5 kg   SpO2 98%   BMI 41.98 kg/m   Physical Exam  Constitutional: She is oriented to person, place, and time. She appears well-developed and well-nourished. No distress.  HENT:  Head: Normocephalic and atraumatic.  Eyes: Conjunctivae are normal. No scleral icterus.  Neck: Normal range of motion.  Cardiovascular: Normal rate, regular rhythm and normal heart sounds. Exam reveals no gallop and no friction rub.  No murmur heard. Pulmonary/Chest: Effort normal and breath sounds normal. No respiratory distress.  Abdominal: Soft. Bowel sounds are normal. She exhibits no distension and no mass. There is no tenderness.  There is no guarding.  Neurological: She is alert and oriented to person, place, and time.  Skin: Skin is warm and dry. She is not diaphoretic.  Psychiatric: Her behavior is normal.  Nursing note and vitals reviewed.    ED Treatments / Results  Labs (all labs ordered are listed, but only abnormal results are displayed) Labs Reviewed - No data to display  EKG None  Radiology Ct Head Wo Contrast  Result Date: 09/07/2018 CLINICAL DATA:  Severe headache for 3 days. Dental pain for 3 months. EXAM: CT HEAD WITHOUT CONTRAST TECHNIQUE: Contiguous axial images were obtained from the base of the skull through the vertex without intravenous contrast. COMPARISON:  August 10, 2015 FINDINGS: Brain: No subdural, epidural or subarachnoid hemorrhage. Cerebellum, brainstem, and basal cisterns are normal. Ventricles and sulci are unremarkable. No acute cortical ischemia or infarct. No mass effect or midline shift. Vascular: No hyperdense vessel or unexpected calcification. Skull: Normal. Negative for fracture or focal lesion. Sinuses/Orbits: No acute finding. Other: None. IMPRESSION: No acute intracranial abnormalities. Electronically Signed   By: Dorise Bullion III M.D   On: 09/07/2018 23:48   Dg Chest Port 1 View  Result Date: 09/07/2018 CLINICAL DATA:  Severe headache, chest pain, dental pain, fluttering in the chest, dizziness. Symptoms for 3 days. EXAM: PORTABLE CHEST 1 VIEW COMPARISON:  08/10/2015 FINDINGS: Shallow inspiration with atelectasis in the lung bases. No blunting of costophrenic angles. No pneumothorax. Mild cardiac enlargement. No vascular congestion or edema. Mediastinal contours appear intact. IMPRESSION: Shallow inspiration with atelectasis in the lung bases. Mild cardiac enlargement. Electronically Signed   By: Lucienne Capers M.D.   On: 09/07/2018 22:51   Ct Angio Chest/abd/pel For Dissection W And/or Wo Contrast  Result Date: 09/08/2018 CLINICAL DATA:  Acute onset chest and back  pain. EXAM: CT ANGIOGRAPHY CHEST, ABDOMEN AND PELVIS TECHNIQUE: Multidetector CT imaging through the chest, abdomen and pelvis was performed using the standard protocol during  bolus administration of intravenous contrast. Multiplanar reconstructed images and MIPs were obtained and reviewed to evaluate the vascular anatomy. CONTRAST:  137mL ISOVUE-370 IOPAMIDOL (ISOVUE-370) INJECTION 76% COMPARISON:  CT abdomen pelvis 06/18/2016 CTA chest 06/09/2015 FINDINGS: CTA CHEST FINDINGS Cardiovascular: --Heart: The heart size is mildly enlarged. There is nopericardial effusion. --Aorta: The course and caliber of the thoracic aorta are normal. There is no aortic atherosclerotic calcification. Precontrast images show no aortic intramural hematoma. There is no blood pool, dissection or penetrating ulcer demonstrated on arterial phase postcontrast imaging. There is a conventional 3 vessel aortic arch branching pattern. The proximal arch vessels are widely patent. --Pulmonary Arteries: Contrast timing is optimized for preferential opacification of the aorta. Within that limitation, normal central pulmonary arteries. Mediastinum/Nodes: Small sliding hiatal hernia. No mediastinal or axillary lymphadenopathy. Lungs/Pleura: Biapical cystic change. No focal airspace consolidation. No pleural effusion or pneumothorax. Musculoskeletal: No chest wall abnormality. No acute osseous findings. Review of the MIP images confirms the above findings. CTA ABDOMEN AND PELVIS FINDINGS VASCULAR Aorta: Normal caliber aorta without aneurysm, dissection, vasculitis or hemodynamically significant stenosis. There is no aortic atherosclerosis. Celiac: No aneurysm, dissection or hemodynamically significant stenosis. Replaced right hepatic artery arises from the SMA. SMA: Widely patent without dissection or stenosis. Renals: Single renal arteries bilaterally. No aneurysm, dissection, stenosis or evidence of fibromuscular dysplasia. IMA: Patent without  abnormality. Inflow: No aneurysm, stenosis or dissection. Veins: Normal course and caliber of the major veins. Assessment is otherwise limited by the arterial dominant contrast phase. Review of the MIP images confirms the above findings. NON-VASCULAR Hepatobiliary: Normal hepatic contours and density. No visible biliary dilatation. Normal gallbladder. Pancreas: Normal contours without ductal dilatation. No peripancreatic fluid collection. Spleen: Normal arterial phase splenic enhancement pattern. Adrenals/Urinary Tract: --Adrenal glands: Normal. --Right kidney/ureter: No hydronephrosis or perinephric stranding. No nephrolithiasis. No obstructing ureteral stones. --Left kidney/ureter: No hydronephrosis or perinephric stranding. No nephrolithiasis. No obstructing ureteral stones. --Urinary bladder: Unremarkable. Stomach/Bowel: --Stomach/Duodenum: No hiatal hernia or other gastric abnormality. Normal duodenal course and caliber. --Small bowel: No dilatation or inflammation. --Colon: No focal abnormality. --Appendix: Not visualized. No right lower quadrant inflammation or free fluid. Lymphatic: No abdominal or pelvic lymphadenopathy. Reproductive: Pelvis is largely obscured by beam attenuation artifacts. Musculoskeletal. No bony spinal canal stenosis or focal osseous abnormality. Other: None. Review of the MIP images confirms the above findings. . IMPRESSION: 1. No acute aortic syndrome. 2. No acute abnormality of the chest, abdomen or pelvis. Electronically Signed   By: Ulyses Jarred M.D.   On: 09/08/2018 00:13    Procedures Procedures (including critical care time)  Medications Ordered in ED Medications  ketorolac (TORADOL) 30 MG/ML injection 30 mg (30 mg Intramuscular Given 09/08/18 0705)     Initial Impression / Assessment and Plan / ED Course  I have reviewed the triage vital signs and the nursing notes.  Pertinent labs & imaging results that were available during my care of the patient were reviewed  by me and considered in my medical decision making (see chart for details).     Patient MRI negative for acute stroke.  Patient still complaining of tooth pain.  The MRI does show some sinusitis.  Patient will be treated with Augmentin, tramadol and anti-inflammatories.  She appears appropriate for discharge at this time a follow-up with her primary care physician and should also follow-up with a dentist.  I discussed return precautions.  Final Clinical Impressions(s) / ED Diagnoses   Final diagnoses:  Pain, dental  Acute non-recurrent sinusitis, unspecified location  ED Discharge Orders    None       Margarita Mail, PA-C 09/08/18 1527    Ezequiel Essex, MD 09/08/18 2051

## 2018-09-08 NOTE — Discharge Instructions (Signed)
Your MRI was normal. It did show some sinus infection which may be the cause of, or contributing to your dental pain.  Please follow up with your primary care doctor, Dr. Charlane Ferretti and with a dentist.  You were given narcotic and or sedative medications while in the emergency department. Do not drive. Do not use machinery or power tools. Do not sign legal documents. Do not drink alcohol. Do not take sleeping pills. Do not supervise children by yourself. Do not participate in activities that require climbing or being in high places.  [facility]  [address]  [place], [place] [address]  Phone [phone]  [facility] [place], [place], exemplifies the [employer] vision to improve the health and quality of life of all [place] by Regulatory affairs officer with a passion to care for the underserved and by leading the nation in community-based, service learning oral health education. We are committed to offering comprehensive general dental services for adults, children and special needs patients in a safe, caring and professional setting.  Appointments: Our clinic is open Monday through Friday 8:00 a.m. until 5:00 p.m. The amount of time scheduled for an appointment depends on the patients specific needs. We ask that you keep your appointed time for care or provide 24-hour notice of all appointment changes. Parents or legal guardians must accompany minor children.  Payment for Services: Medicaid and other insurance plans are welcome. Payment for services is due when services are rendered and may be made by cash or credit card. If you have dental insurance, we will assist you with your claim submission.   Emergencies: Emergency services will be provided Monday through Friday on a walk-in basis. Please arrive  early for emergency services. After hours emergency services will be provided for patients of record as required.  Services:  Marine scientist Dentistry  Oral Surgery - Extractions  Root Canals  Sealants and Tooth Colored Fillings  Crowns and Bridges  Dentures and Partial Dentures  Implant Services  Periodontal Services and Cleanings  Cosmetic Statistician  3[facility]

## 2018-09-08 NOTE — ED Notes (Signed)
MRI aware another order for medication has been placed. States it will probably be past 0700 before they can get her.

## 2018-09-08 NOTE — ED Notes (Signed)
Pt now stating she doesn't want to have MRI and wants to leave. Informed pt she would be leaving AMA if this were the case. Pt states she wants to leave but is demanding a prescriptions for pain meds.

## 2018-09-08 NOTE — Progress Notes (Signed)
Pt given haldol for 2nd mri attempt pt put in wide bore scanner and still could not do mri said she needed to be put to sleep if they wanted mri. Pt sent back to er nurse notified

## 2018-09-08 NOTE — ED Notes (Signed)
Pt returns after just leaving AMA, see prior noted. Pt was supposed to have MRI but pt has been back and forth the entire night about getting scan done, finally agreed to it but chose to leave so she could go downstairs.

## 2018-09-08 NOTE — ED Provider Notes (Signed)
Care assumed from Dr. Sedonia Small.  Patient with chronic dental pain. States worsening L sided weakness for past several days, known previous deficits from stroke which patient states is worse. Episode of chest pain 3 days ago. EKG nonischemic. Troponin negative. CTA chest negative for dissection or large PE.  Patient refused MRI several times.  She was given Ativan but states she can still not tolerate the MRI.  She agrees to try MRI one more time. Second troponin negative.  Patient now stating that she wants to leave and go get something to eat.  She was offered a sandwich but declines.  States she is going to go to [employer] and come back.  Discussed that she would be leaving [place] if this were the case.  She appears to have capacity to make these decisions and will leave [place].   Ezequiel Essex, MD 09/08/18 [phone]

## 2018-09-08 NOTE — ED Notes (Signed)
Pt now states she will stay to do the MRI

## 2018-09-08 NOTE — ED Notes (Signed)
MRI called this RN pt refusing scan, declines to have medications per MRI, Harris, PA notified, MRI to have pt transported back to the ED

## 2018-09-08 NOTE — ED Notes (Signed)
Pt getting dressed stating she is leaving to go downstairs to get food. Informed pt if I see her leave I will take her out of the system and she will have to start all over. Pt states that was fine. EDP is aware and at bedside stating that pt would be leaving AMA. Pt agrees and leaves.

## 2018-09-08 NOTE — ED Notes (Signed)
Patient transported to CT 

## 2018-09-08 NOTE — ED Notes (Signed)
Pt refused to sign AMA form. Pt cussing stating this is ridiculous and we are ridiculous.

## 2018-09-09 ENCOUNTER — Ambulatory Visit: Payer: Self-pay | Admitting: Family Medicine

## 2018-09-26 ENCOUNTER — Telehealth: Payer: Self-pay

## 2018-09-26 NOTE — Telephone Encounter (Signed)
Fax received from [employer] requesting insurance information for the patient. Call placed to [employer] # [phone], spoke to [place].  She said that the patient was given a nebulizer.  Informed her that the patient does not have insurance and [place] should be billed for the machine.   Joelene Millin noted that the invoice does note to bill [place].

## 2018-10-09 ENCOUNTER — Ambulatory Visit: Payer: Self-pay | Admitting: Family Medicine

## 2018-10-27 ENCOUNTER — Ambulatory Visit: Payer: Self-pay | Admitting: Family Medicine

## 2018-11-20 ENCOUNTER — Ambulatory Visit: Payer: Self-pay | Admitting: Family Medicine

## 2018-11-24 ENCOUNTER — Ambulatory Visit: Payer: Self-pay | Admitting: Nurse Practitioner

## 2018-12-02 ENCOUNTER — Telehealth: Payer: Self-pay | Admitting: Family Medicine

## 2018-12-02 NOTE — Telephone Encounter (Signed)
Patient called to speak with you regarding burying her son and needing her medications and everything she is going through. Please follow up.

## 2018-12-03 NOTE — Telephone Encounter (Signed)
Follow up   Pt calling to speak with you regarding personal issues. Please follow up

## 2018-12-04 NOTE — Telephone Encounter (Signed)
Incoming call received from patient. Patient shared that she has been off of her medications for months due to financial strain. Pt shared that she is grieving the loss of a child. Pt reports having a "nervous breakdown" recently resulting in inpatient treatment. She is currently receiving grief support services through hospice.   [facility] informed patient about the benefits of obtaining a blue card to assist with affording medication. Pt agreed to apply for financial counseling [facility] card only) on Monday. Appointment scheduled by [employer]. Pt was reminded that [facility] does not offer notary services at this time, patient agreed to have required paperwork notarized in the community. No additional concerns noted.

## 2018-12-08 ENCOUNTER — Ambulatory Visit: Payer: Self-pay

## 2018-12-11 ENCOUNTER — Emergency Department (HOSPITAL_COMMUNITY): Payer: Self-pay

## 2018-12-11 ENCOUNTER — Other Ambulatory Visit: Payer: Self-pay

## 2018-12-11 ENCOUNTER — Emergency Department (HOSPITAL_COMMUNITY)
Admission: EM | Admit: 2018-12-11 | Discharge: 2018-12-11 | Disposition: A | Discharge status: Home / Self Care | Payer: Self-pay | Attending: Emergency Medicine | Admitting: Emergency Medicine

## 2018-12-11 ENCOUNTER — Encounter: Payer: Self-pay | Admitting: Emergency Medicine

## 2018-12-11 DIAGNOSIS — Z79899 Other long term (current) drug therapy: Secondary | ICD-10-CM | POA: Insufficient documentation

## 2018-12-11 DIAGNOSIS — J45909 Unspecified asthma, uncomplicated: Secondary | ICD-10-CM | POA: Insufficient documentation

## 2018-12-11 DIAGNOSIS — F17210 Nicotine dependence, cigarettes, uncomplicated: Secondary | ICD-10-CM | POA: Insufficient documentation

## 2018-12-11 DIAGNOSIS — R200 Anesthesia of skin: Secondary | ICD-10-CM | POA: Insufficient documentation

## 2018-12-11 DIAGNOSIS — R079 Chest pain, unspecified: Secondary | ICD-10-CM | POA: Insufficient documentation

## 2018-12-11 DIAGNOSIS — R531 Weakness: Secondary | ICD-10-CM | POA: Insufficient documentation

## 2018-12-11 DIAGNOSIS — I5032 Chronic diastolic (congestive) heart failure: Secondary | ICD-10-CM | POA: Insufficient documentation

## 2018-12-11 DIAGNOSIS — I110 Hypertensive heart disease with heart failure: Secondary | ICD-10-CM | POA: Insufficient documentation

## 2018-12-11 DIAGNOSIS — Z8673 Personal history of transient ischemic attack (TIA), and cerebral infarction without residual deficits: Secondary | ICD-10-CM | POA: Insufficient documentation

## 2018-12-11 LAB — COMPREHENSIVE METABOLIC PANEL
ALBUMIN: 3.4 g/dL — AB (ref 3.5–5.0)
ALT: 22 U/L (ref 0–44)
AST: 21 U/L (ref 15–41)
Alkaline Phosphatase: 96 U/L (ref 38–126)
Anion gap: 8 (ref 5–15)
BUN: 12 mg/dL (ref 6–20)
CO2: 25 mmol/L (ref 22–32)
Calcium: 9 mg/dL (ref 8.9–10.3)
Chloride: 108 mmol/L (ref 98–111)
Creatinine, Ser: 1.17 mg/dL — ABNORMAL HIGH (ref 0.44–1.00)
GFR calc Af Amer: >60 mL/min (ref >60)
GFR calc non Af Amer: 54 mL/min — ABNORMAL LOW (ref >60)
GLUCOSE: 98 mg/dL (ref 70–99)
Potassium: 3.7 mmol/L (ref 3.5–5.1)
Sodium: 141 mmol/L (ref 135–145)
Total Bilirubin: 0.4 mg/dL (ref 0.3–1.2)
Total Protein: 6.8 g/dL (ref 6.5–8.1)

## 2018-12-11 LAB — CBC
HEMATOCRIT: 45.8 % (ref 36.0–46.0)
Hemoglobin: 14.8 g/dL (ref 12.0–15.0)
MCH: 28.5 pg (ref 26.0–34.0)
MCHC: 32.3 g/dL (ref 30.0–36.0)
MCV: 88.2 fL (ref 80.0–100.0)
NRBC: 0 % (ref 0.0–0.2)
Platelets: 217 10*3/uL (ref 150–400)
RBC: 5.19 MIL/uL — ABNORMAL HIGH (ref 3.87–5.11)
RDW: 14.5 % (ref 11.5–15.5)
WBC: 7.1 10*3/uL (ref 4.0–10.5)

## 2018-12-11 LAB — I-STAT BETA HCG BLOOD, ED (MC, WL, AP ONLY): I-stat hCG, quantitative: <5 m[IU]/mL (ref <5)

## 2018-12-11 LAB — I-STAT TROPONIN, ED: Troponin i, poc: 0 ng/mL (ref 0.00–0.08)

## 2018-12-11 LAB — DIFFERENTIAL
Abs Immature Granulocytes: 0.02 10*3/uL (ref 0.00–0.07)
Basophils Absolute: 0 10*3/uL (ref 0.0–0.1)
Basophils Relative: 1 %
Eosinophils Absolute: 0.1 10*3/uL (ref 0.0–0.5)
Eosinophils Relative: 2 %
Immature Granulocytes: 0 %
LYMPHS ABS: 2.6 10*3/uL (ref 0.7–4.0)
Lymphocytes Relative: 37 %
Monocytes Absolute: 0.7 10*3/uL (ref 0.1–1.0)
Monocytes Relative: 10 %
Neutro Abs: 3.6 10*3/uL (ref 1.7–7.7)
Neutrophils Relative %: 50 %

## 2018-12-11 LAB — PROTIME-INR
INR: 1.03
Prothrombin Time: 13.4 seconds (ref 11.4–15.2)

## 2018-12-11 LAB — CBG MONITORING, ED: Glucose-Capillary: 87 mg/dL (ref 70–99)

## 2018-12-11 LAB — APTT: aPTT: 29 seconds (ref 24–36)

## 2018-12-11 MED ORDER — IOPAMIDOL (ISOVUE-370) INJECTION 76%
INTRAVENOUS | Status: AC
  Filled 2018-12-11: qty 100

## 2018-12-11 MED ORDER — LORAZEPAM 1 MG PO TABS
1.0000 mg | ORAL_TABLET | Freq: Once | ORAL | Status: AC
  Administered 2018-12-11: 1 mg via ORAL
  Filled 2018-12-11: qty 1

## 2018-12-11 MED ORDER — IOPAMIDOL (ISOVUE-370) INJECTION 76%
100.0000 mL | Freq: Once | INTRAVENOUS | Status: AC | PRN
  Administered 2018-12-11: 100 mL via INTRAVENOUS

## 2018-12-11 MED ORDER — LORAZEPAM 2 MG/ML IJ SOLN
0.5000 mg | Freq: Once | INTRAMUSCULAR | Status: AC
  Administered 2018-12-11: 0.5 mg via INTRAVENOUS
  Filled 2018-12-11: qty 1

## 2018-12-11 MED ORDER — SODIUM CHLORIDE 0.9% FLUSH
3.0000 mL | Freq: Once | INTRAVENOUS | Status: AC
Start: 2018-12-11 — End: 2018-12-11
  Administered 2018-12-11: 3 mL via INTRAVENOUS

## 2018-12-11 NOTE — ED Triage Notes (Signed)
Pt to ER for numbness and weakness to the left side that began 5 days ago with left sided weakness that began 4 days ago and then 2 days ago she began having left sided chest discomfort. Pt is ambulatory. 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

## 2018-12-11 NOTE — ED Notes (Signed)
Discharge instructions (including medications) discussed with and copy provided to patient/caregiver.  Pt verbalizes understanding of d/c instructions.  Armband removed, pt d/c for ED  Pt unable to sign

## 2018-12-11 NOTE — ED Notes (Signed)
There is drift to the left extremities and sensation deficit on exam. All symptoms began 4-5 days ago.

## 2018-12-11 NOTE — ED Notes (Signed)
ED Provider at bedside. 

## 2018-12-11 NOTE — ED Notes (Signed)
Pt states she has left sided numbness; pt has hx of stroke in 2016 resulting in right sided weakness.

## 2018-12-11 NOTE — ED Provider Notes (Signed)
[facility] EMERGENCY DEPARTMENT Provider Note   CSN: [id number] Arrival date & time: 12/11/18  1139    History   Chief Complaint Chief Complaint  Patient presents with  . Numbness  . Weakness  . Chest Pain    HPI Taylor Vazquez is a 53 y.o. female.      Weakness  Severity:  Moderate Onset quality:  Sudden Duration:  7 days Timing:  Constant Progression:  Unchanged Chronicity:  New Context: not alcohol use, not drug use, not recent infection and not urinary tract infection   Relieved by:  None tried Worsened by:  Nothing Ineffective treatments:  None tried Associated symptoms: chest pain, sensory-motor deficit and stroke symptoms   Associated symptoms: no abdominal pain, no arthralgias, no cough, no difficulty walking, no dysuria, no numbness in extremities, no fever, no loss of consciousness, no melena, no near-syncope, no seizures, no shortness of breath, no vision change and no vomiting   Risk factors: congestive heart failure   Risk factors: no new medications   Chest Pain  Associated symptoms: weakness   Associated symptoms: no abdominal pain, no back pain, no cough, no fever, no near-syncope, no palpitations, no shortness of breath and no vomiting     Past Medical History:  Diagnosis Date  . Asthma   . CHF (congestive heart failure) ([place])   . Depression   . Hypertension   . Obesity   . Sickle cell trait ([place])   . Stroke [facility])     Patient Active Problem List   Diagnosis Date Noted  . Prediabetes 01/14/2018  . Hyperlipidemia LDL goal <100 04/29/2017  . Migraine 04/26/2017  . 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 Left-sided weakness 06/14/2015  . COPD exacerbation ([place]) 06/09/2015  . Diastolic heart failure ([place]) 06/09/2015  . Shortness of breath 06/08/2015  . Chest pain at rest 06/08/2015  . Chest pain 06/08/2015  . Cocaine abuse ([place]) 05/19/2014  . Chest wall pain 05/17/2014  . Tobacco use disorder 05/17/2014   . CVA (cerebral infarction) 05/16/2014  . Asthma, chronic 05/16/2014  . Chest discomfort 05/16/2014  . Bug bite 05/16/2014  . Hypertension 04/28/2012    Past Surgical History:  Procedure Laterality Date  . NO PAST SURGERIES       OB History    Gravida  8   Para  5   Term      Preterm  5   AB  1   Living  4     SAB      TAB  1   Ectopic      Multiple      Live Births               Home Medications    Prior to Admission medications   Medication Sig Start Date End Date Taking? Authorizing Provider  atorvastatin (LIPITOR) 20 MG tablet Take 1 tablet (20 mg total) by mouth daily. 01/14/18  Yes Charlott Rakes, MD  famotidine (PEPCID) 20 MG tablet Take 1 tablet (20 mg total) by mouth 2 (two) times daily. 01/14/18  Yes Charlott Rakes, MD  acetaminophen (TYLENOL) 325 MG tablet Take 2 tablets (650 mg total) by mouth every 6 (six) hours as needed for mild pain or moderate pain. Patient not taking: Reported on 09/07/2018 06/10/15   Kinnie Feil, MD  amLODipine (NORVASC) 10 MG tablet Take 1 tablet (10 mg total) by mouth daily. Patient not taking: Reported on 12/11/2018 01/14/18   Margarita Rana,  [place], MD  amoxicillin (AMOXIL) 500 MG capsule Take 2 capsules (1,000 mg total) by mouth 3 (three) times daily. Patient not taking: Reported on 09/07/2018 07/24/18   Argentina Donovan, PA-C  amoxicillin-clavulanate (AUGMENTIN) 875-125 MG tablet Take 1 tablet by mouth 2 (two) times daily. One po bid x 7 days Patient not taking: Reported on 12/11/2018 09/08/18   Margarita Mail, PA-C  aspirin 325 MG tablet Take 1 tablet (325 mg total) by mouth daily. Patient not taking: Reported on 09/07/2018 09/30/15   Charlott Rakes, MD  clotrimazole (LOTRIMIN) 1 % cream Apply to affected area 2 times daily Patient not taking: Reported on 09/07/2018 01/05/17   Fransico Meadow, PA-C  Fluticasone-Salmeterol (ADVAIR DISKUS) 100-50 MCG/DOSE AEPB INHALE 1 PUFF INTO THE LUNGS 2 TIMES DAILY. Patient not  taking: Reported on 12/11/2018 01/14/18   Charlott Rakes, MD  furosemide (LASIX) 20 MG tablet Take 1 tablet (20 mg total) by mouth daily. Patient not taking: Reported on 12/11/2018 06/18/18   Argentina Donovan, PA-C  gabapentin (NEURONTIN) 300 MG capsule Take 2 capsules (600 mg total) by mouth 2 (two) times daily. Patient not taking: Reported on 09/07/2018 01/14/18   Charlott Rakes, MD  glucose blood (TRUE METRIX BLOOD GLUCOSE TEST) test strip Use as instructed 07/24/18   Argentina Donovan, PA-C  ibuprofen (ADVIL,MOTRIN) 800 MG tablet Take 1 tablet (800 mg total) by mouth every 8 (eight) hours as needed. With food Patient not taking: Reported on 12/11/2018 12/04/17   Argentina Donovan, PA-C  losartan (COZAAR) 25 MG tablet Take 1 tablet (25 mg total) by mouth daily. Patient not taking: Reported on 12/11/2018 01/14/18   Charlott Rakes, MD  meloxicam (MOBIC) 15 MG tablet Take 1 tablet (15 mg total) by mouth daily. Take 1 daily with food. Patient not taking: Reported on 12/11/2018 09/08/18   Margarita Mail, PA-C  metFORMIN (GLUCOPHAGE) 500 MG tablet Take 1 tablet (500 mg total) by mouth 2 (two) times daily with a meal. Patient not taking: Reported on 12/11/2018 01/14/18   Charlott Rakes, MD  naproxen (NAPROSYN) 500 MG tablet Take 1 tablet (500 mg total) by mouth 2 (two) times daily with a meal. Patient not taking: Reported on 12/11/2018 07/26/18   Larene Pickett, PA-C  omeprazole (PRILOSEC) 40 MG capsule Take 1 capsule (40 mg total) by mouth daily. Patient not taking: Reported on 12/11/2018 04/10/18   Charlott Rakes, MD  oxybutynin (DITROPAN) 5 MG tablet Take 1 tablet (5 mg total) by mouth 2 (two) times daily. Patient not taking: Reported on 09/07/2018 09/17/17   Charlott Rakes, MD  topiramate (TOPAMAX) 50 MG tablet Take 1 tablet (50 mg total) by mouth 2 (two) times daily. Patient not taking: Reported on 12/11/2018 01/14/18   Charlott Rakes, MD  traMADol (ULTRAM) 50 MG tablet Take 1 tablet (50 mg total) by  mouth every 6 (six) hours as needed. Patient not taking: Reported on 12/11/2018 09/08/18   Margarita Mail, PA-C  TRUEPLUS LANCETS 28G MISC 1 each by Does not apply route 2 (two) times daily. 07/24/18   McClung, Dionne Bucy, PA-C  VENTOLIN HFA 108 (90 Base) MCG/ACT inhaler INHALE 2 PUFFS INTO THE LUNGS EVERY 6 (SIX) HOURS AS NEEDED FOR WHEEZING OR SHORTNESS OF BREATH. Patient not taking: No sig reported 03/11/18   Charlott Rakes, MD  Vitamin D, Ergocalciferol, (DRISDOL) 50000 units CAPS capsule Take 1 capsule (50,000 Units total) by mouth every 7 (seven) days. Patient not taking: Reported on 09/07/2018 06/19/18   Argentina Donovan,  PA-C  zolpidem (AMBIEN) 5 MG tablet Take 1 tablet (5 mg total) by mouth at bedtime as needed for sleep. Patient not taking: Reported on 09/07/2018 09/17/17   Charlott Rakes, MD    Family History Family History  Problem Relation Age of Onset  . Hypertension Mother   . Hypertension Father   . Cancer Father   . Hypertension Other   . Sickle cell trait Other   . Diabetes Other   . Other Neg Hx     Social History Social History   Tobacco Use  . Smoking status: Current Some Day Smoker    Packs/day: 0.25    Types: Cigarettes  . Smokeless tobacco: Never Used  Substance Use Topics  . Alcohol use: Not Currently    Alcohol/week: 0.0 standard drinks    Comment: occ  . Drug use: No    Comment: denies crack since 3 or4 months     Allergies   Bee venom and Shellfish allergy   Review of Systems Review of Systems  Constitutional: Negative for chills and fever.  HENT: Negative for ear pain and sore throat.   Eyes: Negative for pain and visual disturbance.  Respiratory: Negative for cough and shortness of breath.   Cardiovascular: Positive for chest pain. Negative for palpitations and near-syncope.  Gastrointestinal: Negative for abdominal pain, melena and vomiting.  Genitourinary: Negative for dysuria and hematuria.  Musculoskeletal: Negative for arthralgias  and back pain.  Skin: Negative for color change and rash.  Neurological: Positive for weakness. Negative for seizures, loss of consciousness and syncope.  All other systems reviewed and are negative.    Physical Exam Updated Vital Signs BP 137/81   Pulse 67   Temp 97.9 F (36.6 C) (Oral)   Resp 19   Ht 5\' 3"  (1.6 m)   Wt 108.9 kg   LMP 06/22/2018   SpO2 98%   BMI 42.51 kg/m   Physical Exam Vitals signs and nursing note reviewed.  Constitutional:      General: She is not in acute distress.    Appearance: She is well-developed.     Comments: Patient resting comfortably, no acute distress.  HENT:     Head: Normocephalic and atraumatic.  Eyes:     Extraocular Movements: Extraocular movements intact.     Conjunctiva/sclera: Conjunctivae normal.     Pupils: Pupils are equal, round, and reactive to light.  Neck:     Musculoskeletal: Neck supple.  Cardiovascular:     Rate and Rhythm: Normal rate and regular rhythm.     Heart sounds: No murmur.  Pulmonary:     Effort: Pulmonary effort is normal. No respiratory distress.     Breath sounds: Normal breath sounds.  Chest:     Chest wall: No mass or tenderness.  Abdominal:     Palpations: Abdomen is soft.     Tenderness: There is no abdominal tenderness.  Musculoskeletal: Normal range of motion.  Skin:    General: Skin is warm and dry.     Capillary Refill: Capillary refill takes less than 2 seconds.  Neurological:     Mental Status: She is alert.     GCS: GCS eye subscore is 4. GCS verbal subscore is 5. GCS motor subscore is 6.     Cranial Nerves: Cranial nerves are intact.     Sensory: Sensory deficit (Patient negates numbness in the left upper and left lower extremity.) present.     Motor: Weakness (4+ on the left upper and left lower,  patient indicates this is new) present.     Coordination: Coordination is intact.     Gait: Gait is intact. Gait (Baseline with a walker) normal.     Deep Tendon Reflexes: Reflexes are  normal and symmetric. Reflexes normal.      ED Treatments / Results  Labs (all labs ordered are listed, but only abnormal results are displayed) Labs Reviewed  CBC - Abnormal; Notable for the following components:      Result Value   RBC 5.19 (*)    All other components within normal limits  COMPREHENSIVE METABOLIC PANEL - Abnormal; Notable for the following components:   Creatinine, Ser 1.17 (*)    Albumin 3.4 (*)    GFR calc non Af Amer 54 (*)    All other components within normal limits  PROTIME-INR  APTT  DIFFERENTIAL  CBG MONITORING, ED  I-STAT BETA HCG BLOOD, ED ([facility], [facility], [facility] ONLY)  I-STAT TROPONIN, ED    EKG EKG Interpretation  Date/Time:  Thursday December 11 2018 11:49:00 EST Ventricular Rate:  61 PR Interval:  138 QRS Duration: 82 QT Interval:  404 QTC Calculation: 406 R Axis:   10 Text Interpretation:  Normal sinus rhythm with sinus arrhythmia Septal infarct , age undetermined Abnormal ECG no sig change from previous Confirmed by Charlesetta Shanks [phone]) on 12/11/2018 11:56:53 AM   Radiology Ct Head Wo Contrast  Result Date: 12/11/2018 CLINICAL DATA:  Possible stroke EXAM: CT HEAD WITHOUT CONTRAST TECHNIQUE: Contiguous axial images were obtained from the base of the skull through the vertex without intravenous contrast. COMPARISON:  09/07/2018 FINDINGS: Brain: No evidence of acute infarction, hemorrhage, hydrocephalus, extra-axial collection or mass lesion/mass effect. Vascular: No hyperdense vessel or unexpected calcification. Skull: Normal. Negative for fracture or focal lesion. Sinuses/Orbits: No acute finding. Other: None. IMPRESSION: No CT evidence of acute stroke or hemorrhage. Electronically Signed   By: Eddie Candle M.D.   On: 12/11/2018 13:25   Mr Brain Wo Contrast  Result Date: 12/11/2018 CLINICAL DATA:  Left-sided numbness and weakness. EXAM: MRI HEAD WITHOUT CONTRAST TECHNIQUE: Multiplanar, multiecho pulse sequences of the brain and surrounding  structures were obtained without intravenous contrast. COMPARISON:  Head CT 12/11/2018 and MRI 09/08/2018 FINDINGS: The patient terminated the examination prior to completion. Axial and coronal diffusion, sagittal T1, and axial T2 sequences were obtained. The T1 sequence is moderately motion degraded. Brain: There is no evidence of acute infarct. No intracranial hemorrhage, mass, midline shift, or extra-axial fluid collection is identified on this limited study. The ventricles and sulci are within normal limits. The brain is normal in signal on the axial T2 sequence. Vascular: Major intracranial vascular flow voids are preserved. Skull and upper cervical spine: Chronically diminished bone marrow T1 signal intensity diffusely, nonspecific though can be anemia, smoking, and obesity. Sinuses/Orbits: Unremarkable orbits. Minimal mucosal thickening in the paranasal sinuses. At most trace mastoid fluid bilaterally. Other: Left parietal scalp scarring. IMPRESSION: Incomplete examination. No evidence of infarct or other acute intracranial abnormality. Electronically Signed   By: Logan Bores M.D.   On: 12/11/2018 17:42   Ct Angio Chest/abd/pel For Dissection W And/or Wo Contrast  Result Date: 12/11/2018 CLINICAL DATA:  Chest pain EXAM: CT ANGIOGRAPHY CHEST, ABDOMEN AND PELVIS TECHNIQUE: Multidetector CT imaging through the chest, abdomen and pelvis was performed using the standard protocol during bolus administration of intravenous contrast. Multiplanar reconstructed images and MIPs were obtained and reviewed to evaluate the vascular anatomy. CONTRAST:  151mL ISOVUE-370 IOPAMIDOL (ISOVUE-370) INJECTION 76% COMPARISON:  None.  FINDINGS: CTA CHEST FINDINGS Cardiovascular: Ectasia of the ascending thoracic aorta without aneurysm without dissection measuring 3.7 x 3.4 cm at the level of the main pulmonary artery. No mural hematoma of the aorta on the unenhanced images through the chest. No acute central pulmonary embolus.  Top normal heart size without pericardial effusion or thickening. Conventional branch pattern of the great vessels without dissection or aneurysm. Mediastinum/Nodes: No enlarged mediastinal, hilar, or axillary lymph nodes. Thyroid gland, trachea, and esophagus demonstrate no significant findings. Lungs/Pleura: Dependent atelectasis bilaterally. No pneumothorax or pulmonary consolidations. No dominant mass or pneumothorax. Centrilobular and paraseptal emphysematous disease in the upper lobes. Musculoskeletal: No chest wall abnormality. No acute or significant osseous findings. Review of the MIP images confirms the above findings. CTA ABDOMEN AND PELVIS FINDINGS VASCULAR Aorta: Normal caliber aorta without aneurysm, dissection, vasculitis or significant stenosis. Celiac: Patent without evidence of aneurysm, dissection, vasculitis or significant stenosis. SMA: Patent without evidence of aneurysm, dissection, vasculitis or significant stenosis. Renals: Both renal arteries are patent without evidence of aneurysm, dissection, vasculitis, fibromuscular dysplasia or significant stenosis. IMA: Patent without evidence of aneurysm, dissection, vasculitis or significant stenosis. Inflow: Patent without evidence of aneurysm, dissection, vasculitis or significant stenosis. Veins: No obvious venous abnormality within the limitations of this arterial phase study. Review of the MIP images confirms the above findings. NON-VASCULAR Hepatobiliary: No focal liver abnormality is seen. No gallstones, gallbladder wall thickening, or biliary dilatation. Pancreas: Unremarkable. No pancreatic ductal dilatation or surrounding inflammatory changes. Spleen: Normal in size without focal abnormality. Adrenals/Urinary Tract: Adrenal glands are unremarkable. Kidneys are normal, without renal calculi, focal lesion, or hydronephrosis. Bladder is unremarkable. Stomach/Bowel: Small hiatal hernia. Decompressed stomach with normal duodenal sweep and  ligament of Treitz position. No bowel obstruction or inflammation. Normal appendix. Distal and terminal ileum are unremarkable. Scattered colonic diverticulosis without acute diverticulitis. There is beam hardening artifact through the lower pelvis due to patient body habitus however that does limit detail for subtle inflammatory change especially the sigmoid. Lymphatic: No lymphadenopathy Reproductive: Uterus and bilateral adnexa are unremarkable. Other: No abdominal wall hernia or abnormality. No abdominopelvic ascites. Musculoskeletal: No acute or significant osseous findings. Review of the MIP images confirms the above findings. IMPRESSION: 1. Ectasia of the ascending thoracic aorta measuring 3.7 x 3.4 cm at the level of the main pulmonary artery. No dissection or aneurysm. 2. No acute pulmonary embolus. 3. Centrilobular and paraseptal emphysematous disease of the upper lobes. 4. No acute solid nor hollow visceral organ abnormality within the abdomen pelvis. 5. Colonic diverticulosis without acute diverticulitis. Emphysema (ICD10-J43.9). Electronically Signed   By: Ashley Royalty M.D.   On: 12/11/2018 18:31    Procedures Procedures (including critical care time)  Medications Ordered in ED Medications  iopamidol (ISOVUE-370) 76 % injection (has no administration in time range)  sodium chloride flush (NS) 0.9 % injection 3 mL (3 mLs Intravenous Given 12/11/18 1612)  LORazepam (ATIVAN) tablet 1 mg (1 mg Oral Given 12/11/18 1547)  LORazepam (ATIVAN) injection 0.5 mg (0.5 mg Intravenous Given 12/11/18 1617)  iopamidol (ISOVUE-370) 76 % injection 100 mL (100 mLs Intravenous Contrast Given 12/11/18 1812)     Initial Impression / Assessment and Plan / ED Course  I have reviewed the triage vital signs and the nursing notes.  Pertinent labs & imaging results that were available during my care of the patient were reviewed by me and considered in my medical decision making (see chart for details).         53 year old female significant  past medical history listed above including 2 prior strokes with right-sided deficits back in 2016.  Patient currently walks with a walker at baseline however indicates that she woke up 7 days ago with pain in her left shoulder and new left-sided deficits in the left upper extremity and left lower extremity.  Patient also endorses some mild left-sided chest pain radiating to the left shoulder blade.  Patient negates this is been on and off since her symptoms.  Denies vomiting, infectious-like symptom such as fever, cough, chills.  Will assess ACS, dissection, stroke at this time.  Laboratory studies reviewed, baseline per patient chart.  EKG shows no signs of ST elevation or depression, appropriate intervals.  CT dissection study indicates no acute dissection.  MRI indicates no acute stroke, diffusion imaging was done for patient stopped.  Discussed with radiology who agreed there is no signs of acute stroke.  Delta troponin negative.  Based on negative imaging, laboratory studies patient is can follow-up with PCP provider.  Patient with strict return precautions.  Patient discharged in stable initial stable vital signs.  The above care was discussed and agreed upon by my attending physician.  Final Clinical Impressions(s) / ED Diagnoses   Final diagnoses:  Weakness    ED Discharge Orders    None       Orson Aloe, MD 12/11/18 Cam Hai, MD 12/16/18 [phone]

## 2018-12-11 NOTE — ED Notes (Addendum)
Pt in MRI  Administered 0.5mg  Ativan IV

## 2018-12-15 ENCOUNTER — Ambulatory Visit: Payer: Self-pay | Admitting: Family Medicine

## 2018-12-19 ENCOUNTER — Ambulatory Visit: Payer: Self-pay | Admitting: Family Medicine

## 2018-12-31 ENCOUNTER — Other Ambulatory Visit: Payer: Self-pay

## 2018-12-31 ENCOUNTER — Ambulatory Visit: Payer: Self-pay | Attending: Family Medicine | Admitting: Family Medicine

## 2018-12-31 ENCOUNTER — Encounter: Payer: Self-pay | Admitting: Family Medicine

## 2018-12-31 ENCOUNTER — Telehealth: Payer: Self-pay

## 2018-12-31 VITALS — BP 134/83 | HR 74 | Temp 98.0°F | Ht 63.0 in | Wt 230.2 lb

## 2018-12-31 DIAGNOSIS — R5383 Other fatigue: Secondary | ICD-10-CM

## 2018-12-31 DIAGNOSIS — R531 Weakness: Secondary | ICD-10-CM

## 2018-12-31 DIAGNOSIS — E785 Hyperlipidemia, unspecified: Secondary | ICD-10-CM

## 2018-12-31 DIAGNOSIS — J438 Other emphysema: Secondary | ICD-10-CM

## 2018-12-31 DIAGNOSIS — I10 Essential (primary) hypertension: Secondary | ICD-10-CM

## 2018-12-31 DIAGNOSIS — R7303 Prediabetes: Secondary | ICD-10-CM

## 2018-12-31 DIAGNOSIS — K047 Periapical abscess without sinus: Secondary | ICD-10-CM

## 2018-12-31 DIAGNOSIS — E559 Vitamin D deficiency, unspecified: Secondary | ICD-10-CM

## 2018-12-31 DIAGNOSIS — G5622 Lesion of ulnar nerve, left upper limb: Secondary | ICD-10-CM

## 2018-12-31 LAB — POCT GLYCOSYLATED HEMOGLOBIN (HGB A1C): HbA1c, POC (controlled diabetic range): 5.4 % (ref 0.0–7.0)

## 2018-12-31 LAB — GLUCOSE, POCT (MANUAL RESULT ENTRY): POC Glucose: 104 mg/dl — AB (ref 70–99)

## 2018-12-31 MED ORDER — FLUTICASONE-SALMETEROL 100-50 MCG/DOSE IN AEPB: INHALATION_SPRAY | RESPIRATORY_TRACT | 3 refills | Status: DC

## 2018-12-31 MED ORDER — ALBUTEROL SULFATE HFA 108 (90 BASE) MCG/ACT IN AERS: INHALATION_SPRAY | RESPIRATORY_TRACT | 2 refills | Status: DC

## 2018-12-31 MED ORDER — AMOXICILLIN 500 MG PO CAPS: 1000.0000 mg | ORAL_CAPSULE | Freq: Three times a day (TID) | ORAL | 0 refills | Status: DC

## 2018-12-31 MED ORDER — METFORMIN HCL 500 MG PO TABS: 500.0000 mg | ORAL_TABLET | Freq: Two times a day (BID) | ORAL | 3 refills | Status: DC

## 2018-12-31 MED ORDER — ATORVASTATIN CALCIUM 20 MG PO TABS: 20.0000 mg | ORAL_TABLET | Freq: Every day | ORAL | 5 refills | Status: DC

## 2018-12-31 MED ORDER — TRAMADOL HCL 50 MG PO TABS: 50.0000 mg | ORAL_TABLET | Freq: Two times a day (BID) | ORAL | 0 refills | Status: DC | PRN

## 2018-12-31 MED ORDER — AMLODIPINE BESYLATE 10 MG PO TABS: 10.0000 mg | ORAL_TABLET | Freq: Every day | ORAL | 5 refills | Status: DC

## 2018-12-31 MED ORDER — ASPIRIN EC 81 MG PO TBEC: 81.0000 mg | DELAYED_RELEASE_TABLET | Freq: Every day | ORAL | 3 refills | Status: DC

## 2018-12-31 MED ORDER — GABAPENTIN 300 MG PO CAPS: 600.0000 mg | ORAL_CAPSULE | Freq: Two times a day (BID) | ORAL | 3 refills | Status: DC

## 2018-12-31 MED ORDER — LOSARTAN POTASSIUM 25 MG PO TABS: 25.0000 mg | ORAL_TABLET | Freq: Every day | ORAL | 6 refills | Status: DC

## 2018-12-31 MED FILL — AMLODIPINE BESYLATE 10 MG T: 10 | 30 days supply | Qty: 30 | Fill #3

## 2018-12-31 MED FILL — LOSARTAN POTASSIUM 25 MG TA: 25 | 30 days supply | Qty: 30 | Fill #3

## 2018-12-31 MED FILL — ATORVASTATIN 20 MG TABLET: 20 | 30 days supply | Qty: 30 | Fill #2

## 2018-12-31 NOTE — Progress Notes (Signed)
Subjective:  Patient ID: Taylor Vazquez, female    DOB: 04/06/1966  Age: 53 y.o. MRN: [id number]  CC: Hypertension   HPI JAILIN MANOCCHIO is a 53 year-old female with a history of COPD, asthma, diastolic CHF (EF 94-85%), obesity who presents today for follow-up visit. She complains of weakness in her left hand and left leg with associated numbness of the right fourth and fifth fingers.  She has tingling and pain which shoots up her left forearm. Seen at the ED last month for same symptoms at which time she also had left facial numbness and but states she presented to the ED about 3 weeks after symptoms had commenced as she was unable to get time off work.  CT head was unremarkable. She denies speech abnormalities and has no facial numbness at this time.  She also complains of lower incisor pain and is status post 2 root canals in her other teeth but is unable to afford to pay the co-pay to see a dentist. She complains of excessive thirst, fatigue and feeling drained.  Her A1c is 5.4 today.  Past Medical History:  Diagnosis Date   Asthma    CHF (congestive heart failure) (HCC)    Depression    Hypertension    Obesity    Sickle cell trait ([place])    Stroke [facility])     Past Surgical History:  Procedure Laterality Date   NO PAST SURGERIES      Family History  Problem Relation Age of Onset   Hypertension Mother    Hypertension Father    Cancer Father    Hypertension Other    Sickle cell trait Other    Diabetes Other    Other Neg Hx     Allergies  Allergen Reactions   Bee Venom Anaphylaxis   Shellfish Allergy Anaphylaxis and Swelling    Outpatient Medications Prior to Visit  Medication Sig Dispense Refill   famotidine (PEPCID) 20 MG tablet Take 1 tablet (20 mg total) by mouth 2 (two) times daily. 60 tablet 3   glucose blood (TRUE METRIX BLOOD GLUCOSE TEST) test strip Use as instructed 100 each 12   omeprazole (PRILOSEC) 40 MG capsule Take 1  capsule (40 mg total) by mouth daily. 30 capsule 3   atorvastatin (LIPITOR) 20 MG tablet Take 1 tablet (20 mg total) by mouth daily. 30 tablet 5   metFORMIN (GLUCOPHAGE) 500 MG tablet Take 1 tablet (500 mg total) by mouth 2 (two) times daily with a meal. 60 tablet 3   traMADol (ULTRAM) 50 MG tablet Take 1 tablet (50 mg total) by mouth every 6 (six) hours as needed. 15 tablet 0   acetaminophen (TYLENOL) 325 MG tablet Take 2 tablets (650 mg total) by mouth every 6 (six) hours as needed for mild pain or moderate pain. (Patient not taking: Reported on 09/07/2018)     meloxicam (MOBIC) 15 MG tablet Take 1 tablet (15 mg total) by mouth daily. Take 1 daily with food. (Patient not taking: Reported on 12/11/2018) 10 tablet 0   naproxen (NAPROSYN) 500 MG tablet Take 1 tablet (500 mg total) by mouth 2 (two) times daily with a meal. (Patient not taking: Reported on 12/11/2018) 30 tablet 0   TRUEPLUS LANCETS 28G MISC 1 each by Does not apply route 2 (two) times daily. (Patient not taking: Reported on 12/31/2018) 100 each 1   Vitamin D, Ergocalciferol, (DRISDOL) 50000 units CAPS capsule Take 1 capsule (50,000 Units total) by mouth every 7 (  seven) days. (Patient not taking: Reported on 09/07/2018) 16 capsule 0   amLODipine (NORVASC) 10 MG tablet Take 1 tablet (10 mg total) by mouth daily. (Patient not taking: Reported on 12/11/2018) 30 tablet 5   amoxicillin (AMOXIL) 500 MG capsule Take 2 capsules (1,000 mg total) by mouth 3 (three) times daily. (Patient not taking: Reported on 09/07/2018) 30 capsule 0   amoxicillin-clavulanate (AUGMENTIN) 875-125 MG tablet Take 1 tablet by mouth 2 (two) times daily. One po bid x 7 days (Patient not taking: Reported on 12/11/2018) 14 tablet 0   aspirin 325 MG tablet Take 1 tablet (325 mg total) by mouth daily. (Patient not taking: Reported on 09/07/2018) 30 tablet 2   clotrimazole (LOTRIMIN) 1 % cream Apply to affected area 2 times daily (Patient not taking: Reported on  09/07/2018) 60 g 0   Fluticasone-Salmeterol (ADVAIR DISKUS) 100-50 MCG/DOSE AEPB INHALE 1 PUFF INTO THE LUNGS 2 TIMES DAILY. (Patient not taking: Reported on 12/11/2018) 60 each 3   furosemide (LASIX) 20 MG tablet Take 1 tablet (20 mg total) by mouth daily. (Patient not taking: Reported on 12/11/2018) 7 tablet 0   gabapentin (NEURONTIN) 300 MG capsule Take 2 capsules (600 mg total) by mouth 2 (two) times daily. (Patient not taking: Reported on 09/07/2018) 120 capsule 3   ibuprofen (ADVIL,MOTRIN) 800 MG tablet Take 1 tablet (800 mg total) by mouth every 8 (eight) hours as needed. With food (Patient not taking: Reported on 12/11/2018) 30 tablet 0   losartan (COZAAR) 25 MG tablet Take 1 tablet (25 mg total) by mouth daily. (Patient not taking: Reported on 12/11/2018) 30 tablet 6   oxybutynin (DITROPAN) 5 MG tablet Take 1 tablet (5 mg total) by mouth 2 (two) times daily. (Patient not taking: Reported on 09/07/2018) 60 tablet 3   topiramate (TOPAMAX) 50 MG tablet Take 1 tablet (50 mg total) by mouth 2 (two) times daily. (Patient not taking: Reported on 12/11/2018) 60 tablet 5   VENTOLIN HFA 108 (90 Base) MCG/ACT inhaler INHALE 2 PUFFS INTO THE LUNGS EVERY 6 (SIX) HOURS AS NEEDED FOR WHEEZING OR SHORTNESS OF BREATH. (Patient not taking: No sig reported) 18 g 2   zolpidem (AMBIEN) 5 MG tablet Take 1 tablet (5 mg total) by mouth at bedtime as needed for sleep. (Patient not taking: Reported on 09/07/2018) 30 tablet 1   Facility-Administered Medications Prior to Visit  Medication Dose Route Frequency Provider Last Rate Last Dose   albuterol (PROVENTIL) (2.5 MG/3ML) 0.083% nebulizer solution 2.5 mg  2.5 mg Nebulization Q4H PRN Charlott Rakes, MD         ROS Review of Systems  Constitutional: Positive for fatigue. Negative for activity change and appetite change.  HENT: Positive for dental problem. Negative for congestion, sinus pressure and sore throat.   Eyes: Negative for visual disturbance.    Respiratory: Negative for cough, chest tightness, shortness of breath and wheezing.   Cardiovascular: Negative for chest pain and palpitations.  Gastrointestinal: Negative for abdominal distention, abdominal pain and constipation.  Endocrine: Negative for polydipsia.  Genitourinary: Negative for dysuria and frequency.  Musculoskeletal: Negative for arthralgias and back pain.  Skin: Negative for rash.  Neurological: Positive for numbness. Negative for tremors and light-headedness.  Hematological: Does not bruise/bleed easily.  Psychiatric/Behavioral: Negative for agitation and behavioral problems.    Objective:  BP 134/83    Pulse 74    Temp 98 F (36.7 C) (Oral)    Ht 5\' 3"  (1.6 m)    Wt 230 lb  3.2 oz (104.4 kg)    SpO2 98%    BMI 40.78 kg/m   BP/Weight 12/31/2018 12/11/2018 09/73/5329  Systolic BP 924 268 341  Diastolic BP 83 78 73  Wt. (Lbs) 230.2 240 237  BMI 40.78 42.51 41.98      Physical Exam Constitutional:      Appearance: She is well-developed.  HENT:     Mouth/Throat:     Comments: Dental caries in lower front incisor Cardiovascular:     Rate and Rhythm: Normal rate.     Heart sounds: Normal heart sounds. No murmur.  Pulmonary:     Effort: Pulmonary effort is normal.     Breath sounds: Normal breath sounds. No wheezing or rales.  Chest:     Chest wall: No tenderness.  Abdominal:     General: Bowel sounds are normal. There is no distension.     Palpations: Abdomen is soft. There is no mass.     Tenderness: There is no abdominal tenderness.  Musculoskeletal: Normal range of motion.  Neurological:     Mental Status: She is alert and oriented to person, place, and time.     Sensory: Sensory deficit (dysesthesia in left 4th and 5th fingers) present.     Comments: Motor strength: Right upper extremity, right lower extremity-5/5 Left upper extremity, left lower extremity-4/5  Psychiatric:        Mood and Affect: Mood normal.        Behavior: Behavior normal.      CMP Latest Ref Rng & Units 12/11/2018 09/07/2018 06/18/2018  Glucose 70 - 99 mg/dL 98 99 92  BUN 6 - 20 mg/dL 12 12 12   Creatinine 0.44 - 1.00 mg/dL 1.17(H) 0.79 0.90  Sodium 135 - 145 mmol/L 141 129(L) 141  Potassium 3.5 - 5.1 mmol/L 3.7 3.1(L) 4.4  Chloride 98 - 111 mmol/L 108 104 105  CO2 22 - 32 mmol/L 25 18(L) 24  Calcium 8.9 - 10.3 mg/dL 9.0 8.6(L) 8.9  Total Protein 6.5 - 8.1 g/dL 6.8 - 6.7  Total Bilirubin 0.3 - 1.2 mg/dL 0.4 - 0.2  Alkaline Phos 38 - 126 U/L 96 - 146(H)  AST 15 - 41 U/L 21 - 17  ALT 0 - 44 U/L 22 - 19    Lipid Panel     Component Value Date/Time   CHOL 182 04/26/2017 1146   TRIG 95 04/26/2017 1146   HDL 46 04/26/2017 1146   CHOLHDL 4.0 04/26/2017 1146   CHOLHDL 4.8 05/17/2014 0330   VLDL 27 05/17/2014 0330   LDLCALC 117 (H) 04/26/2017 1146    CBC    Component Value Date/Time   WBC 7.1 12/11/2018 1211   RBC 5.19 (H) 12/11/2018 1211   HGB 14.8 12/11/2018 1211   HGB 13.8 06/18/2018 1542   HCT 45.8 12/11/2018 1211   HCT 41.3 06/18/2018 1542   PLT 217 12/11/2018 1211   PLT 241 06/18/2018 1542   MCV 88.2 12/11/2018 1211   MCV 82 06/18/2018 1542   MCV 86 07/06/2014 1514   MCH 28.5 12/11/2018 1211   MCHC 32.3 12/11/2018 1211   RDW 14.5 12/11/2018 1211   RDW 15.7 (H) 06/18/2018 1542   RDW 15.0 (H) 07/06/2014 1514   LYMPHSABS 2.6 12/11/2018 1211   LYMPHSABS 2.8 06/18/2018 1542   LYMPHSABS 2.8 06/14/2014 1214   MONOABS 0.7 12/11/2018 1211   MONOABS 0.9 06/14/2014 1214   EOSABS 0.1 12/11/2018 1211   EOSABS 0.2 06/18/2018 1542   EOSABS 0.2 06/14/2014 1214  BASOSABS 0.0 12/11/2018 1211   BASOSABS 0.0 06/18/2018 1542   BASOSABS 0.1 06/14/2014 1214    Lab Results  Component Value Date   HGBA1C 5.4 12/31/2018    Assessment & Plan:   1. Prediabetes Controlled with A1c of 5.4 Continue diabetic diet, lifestyle modifications - POCT glucose (manual entry) - POCT glycosylated hemoglobin (Hb A1C) - metFORMIN (GLUCOPHAGE) 500 MG tablet;  Take 1 tablet (500 mg total) by mouth 2 (two) times daily with a meal.  Dispense: 60 tablet; Refill: 3  2. Ulnar neuropathy of left upper extremity Unknown etiology She might need nerve conduction studies - Ambulatory referral to Neurology  3. Hyperlipidemia LDL goal <100 Controlled Low-cholesterol - atorvastatin (LIPITOR) 20 MG tablet; Take 1 tablet (20 mg total) by mouth daily.  Dispense: 30 tablet; Refill: 5  4. Essential hypertension Controlled - amLODipine (NORVASC) 10 MG tablet; Take 1 tablet (10 mg total) by mouth daily.  Dispense: 30 tablet; Refill: 5 - losartan (COZAAR) 25 MG tablet; Take 1 tablet (25 mg total) by mouth daily.  Dispense: 30 tablet; Refill: 6  5. Other emphysema ([place]) No recent exacerbation - Fluticasone-Salmeterol (ADVAIR DISKUS) 100-50 MCG/DOSE AEPB; INHALE 1 PUFF INTO THE LUNGS 2 TIMES DAILY.  Dispense: 60 each; Refill: 3 - albuterol (VENTOLIN HFA) 108 (90 Base) MCG/ACT inhaler; INHALE 2 PUFFS INTO THE LUNGS EVERY 6 (SIX) HOURS AS NEEDED FOR WHEEZING OR SHORTNESS OF BREATH.  Dispense: 18 g; Refill: 2  6. Left-sided weakness She probably had a TIA which was not reflected on her most recent CT head Will refer for PT as her gait is unstable - Ambulatory referral to Neurology - gabapentin (NEURONTIN) 300 MG capsule; Take 2 capsules (600 mg total) by mouth 2 (two) times daily.  Dispense: 120 capsule; Refill: 3 - Ambulatory referral to Physical Therapy - VITAMIN D 25 Hydroxy (Vit-D Deficiency, Fractures) - aspirin EC 81 MG tablet; Take 1 tablet (81 mg total) by mouth daily.  Dispense: 30 tablet; Refill: 3  7. Tooth abscess She needs to see dentist - amoxicillin (AMOXIL) 500 MG capsule; Take 2 capsules (1,000 mg total) by mouth 3 (three) times daily.  Dispense: 30 capsule; Refill: 0 - traMADol (ULTRAM) 50 MG tablet; Take 1 tablet (50 mg total) by mouth every 12 (twelve) hours as needed.  Dispense: 14 tablet; Refill: 0  8. Other fatigue We will check vitamin D  level   Meds ordered this encounter  Medications   atorvastatin (LIPITOR) 20 MG tablet    Sig: Take 1 tablet (20 mg total) by mouth daily.    Dispense:  30 tablet    Refill:  5   amLODipine (NORVASC) 10 MG tablet    Sig: Take 1 tablet (10 mg total) by mouth daily.    Dispense:  30 tablet    Refill:  5   Fluticasone-Salmeterol (ADVAIR DISKUS) 100-50 MCG/DOSE AEPB    Sig: INHALE 1 PUFF INTO THE LUNGS 2 TIMES DAILY.    Dispense:  60 each    Refill:  3   gabapentin (NEURONTIN) 300 MG capsule    Sig: Take 2 capsules (600 mg total) by mouth 2 (two) times daily.    Dispense:  120 capsule    Refill:  3   losartan (COZAAR) 25 MG tablet    Sig: Take 1 tablet (25 mg total) by mouth daily.    Dispense:  30 tablet    Refill:  6   metFORMIN (GLUCOPHAGE) 500 MG tablet    Sig: Take  1 tablet (500 mg total) by mouth 2 (two) times daily with a meal.    Dispense:  60 tablet    Refill:  3   albuterol (VENTOLIN HFA) 108 (90 Base) MCG/ACT inhaler    Sig: INHALE 2 PUFFS INTO THE LUNGS EVERY 6 (SIX) HOURS AS NEEDED FOR WHEEZING OR SHORTNESS OF BREATH.    Dispense:  18 g    Refill:  2   aspirin EC 81 MG tablet    Sig: Take 1 tablet (81 mg total) by mouth daily.    Dispense:  30 tablet    Refill:  3   amoxicillin (AMOXIL) 500 MG capsule    Sig: Take 2 capsules (1,000 mg total) by mouth 3 (three) times daily.    Dispense:  30 capsule    Refill:  0   traMADol (ULTRAM) 50 MG tablet    Sig: Take 1 tablet (50 mg total) by mouth every 12 (twelve) hours as needed.    Dispense:  14 tablet    Refill:  0    Follow-up: Return in about 3 months (around 04/02/2019) for follow up of chronic medical conditions.       Charlott Rakes, MD, FAAFP. [facility], [place]   12/31/2018, 4:16 PM

## 2018-12-31 NOTE — Patient Instructions (Signed)

## 2018-12-31 NOTE — Telephone Encounter (Signed)
Met with patient when she was in the clinic today for her appointment and assisted with completion of [employer] application.    The completed application was faxed to [employer] eligibility.

## 2018-12-31 NOTE — Progress Notes (Signed)
Patient is having numbness in right hands.  Patient is having dental pain.

## 2019-01-01 ENCOUNTER — Encounter: Payer: Self-pay | Admitting: Family Medicine

## 2019-01-01 LAB — VITAMIN D 25 HYDROXY (VIT D DEFICIENCY, FRACTURES): Vit D, 25-Hydroxy: 9.7 ng/mL — ABNORMAL LOW (ref 30.0–100.0)

## 2019-01-01 MED ORDER — VITAMIN D (ERGOCALCIFEROL) 1.25 MG (50000 UNIT) PO CAPS: 50000.0000 [IU] | ORAL_CAPSULE | ORAL | 1 refills | Status: DC

## 2019-01-01 MED FILL — traMADol HCL 50 MG TABS: 50 | 7 days supply | Qty: 14 | Fill #0

## 2019-01-01 MED FILL — VIT D2 1.25 MG (50,000 UNIT: 1.25 MG | 84 days supply | Qty: 12 | Fill #0

## 2019-01-01 MED FILL — !VENTOLIN HFA INHALER: 108 (90 BAS | 25 days supply | Qty: 18 | Fill #0

## 2019-01-01 MED FILL — AMOXICILLIN 500 MG CAPSULE: 500 | 5 days supply | Qty: 30 | Fill #0

## 2019-01-01 MED FILL — !ADVAIR 100/50 DISKUS: 100-50 | 30 days supply | Qty: 60 | Fill #0

## 2019-01-01 MED FILL — metFORMIN HCL 500 MG TABS: 500 | 30 days supply | Qty: 60 | Fill #0

## 2019-01-01 NOTE — Progress Notes (Signed)
[facility] Clinic Note - Initial Visit   Date: 01/02/19  Taylor Vazquez MRN: [id number] DOB: [birth year]   Dear Dr. Margarita Rana:  Thank you for your kind referral of Taylor Vazquez for consultation of left hand paresthesias and left-sided weakness. Although her history is well known to you, please allow [place] to reiterate it for the purpose of our medical record. The patient was accompanied to the clinic by self.   History of Present Illness: Taylor Vazquez is a 53 y.o. right-handed African-American female with COPD, asthma, diastolic heart failure, tobacco use, GERD, and obesity presenting for evaluation of left hand paresthesias and leg weakness..   In mid-February, she woke up with chest pain and numbness/tingling down the left arm.  She went to the ER where evaluation for chest pain and stroke was negative.  Since this time, she continues to have numbness and tingling involving the fourth and fifth digits on the left hand into the forearm.  She has mild weakness of the left hand.  Upon further questioning, she admits to regularly sleeping with her elbow flexed and her head supported on her hand for many years.  She also has a tendency to prefer sitting on a couch in the same manner.  She denies any right hand symptoms.  She has had 2 prior ER visits for transient neurological symptoms in November 2019 and February 2020 where MRI brain did not show evidence of acute stroke.    She tells me she has history of a stroke in 2016 and had to rehab stay following this.  However, review of her MRI from 2016 does not show any acute stroke.  This information was shared with patient.  She has been going through a significant amount of stress related to the passing of a 28 year old son in 2019.  She also reports losing her father in the past year, as well as 2 other family deaths.  She is not seeing grief counselor.  She is striving to take better control of her own  health and walks for several hours daily.  She has lost 80 pounds with lifestyle modification.  She works as a Programmer, applications.  Out-side paper records, electronic medical record, and images have been reviewed where available and summarized as:  Lab Results  Component Value Date   HGBA1C 5.4 12/31/2018   Lab Results  Component Value Date   [id number] 867 01/14/2018   Lab Results  Component Value Date   TSH 2.120 06/18/2018   Lab Results  Component Value Date   ESRSEDRATE 8 05/06/2015   MRI brain wo contrast 12/11/2018:  Incomplete examination. No evidence of infarct or other acute intracranial abnormality. MRI brain wo contrast 09/08/2018:   1. Normal MRI appearance of the brain. 2. The prominent adenoid tissue and bilateral mastoid effusions.Question recent upper respiratory infection. 3. Mild left maxillary sinus disease.  Past Medical History:  Diagnosis Date   Asthma    CHF (congestive heart failure) (HCC)    Depression    Hypertension    Obesity    Sickle cell trait ([place])    Stroke [facility])     Past Surgical History:  Procedure Laterality Date   NO PAST SURGERIES       Medications:  Outpatient Encounter Medications as of 01/02/2019  Medication Sig   acetaminophen (TYLENOL) 325 MG tablet Take 2 tablets (650 mg total) by mouth every 6 (six) hours as needed for mild pain or moderate pain.   atorvastatin (  LIPITOR) 20 MG tablet Take 20 mg by mouth daily.   albuterol (VENTOLIN HFA) 108 (90 Base) MCG/ACT inhaler INHALE 2 PUFFS INTO THE LUNGS EVERY 6 (SIX) HOURS AS NEEDED FOR WHEEZING OR SHORTNESS OF BREATH.   amLODipine (NORVASC) 10 MG tablet Take 1 tablet (10 mg total) by mouth daily.   amoxicillin (AMOXIL) 500 MG capsule Take 2 capsules (1,000 mg total) by mouth 3 (three) times daily.   aspirin EC 81 MG tablet Take 1 tablet (81 mg total) by mouth daily.   gabapentin (NEURONTIN) 300 MG capsule Take 2 capsules (600 mg total) by mouth 2 (two) times daily.  (Patient not taking: Reported on 01/02/2019)   glucose blood (TRUE METRIX BLOOD GLUCOSE TEST) test strip Use as instructed   losartan (COZAAR) 25 MG tablet Take 1 tablet (25 mg total) by mouth daily.   metFORMIN (GLUCOPHAGE) 500 MG tablet Take 1 tablet (500 mg total) by mouth 2 (two) times daily with a meal.   omeprazole (PRILOSEC) 40 MG capsule Take 1 capsule (40 mg total) by mouth daily.   traMADol (ULTRAM) 50 MG tablet Take 1 tablet (50 mg total) by mouth every 12 (twelve) hours as needed.   TRUEPLUS LANCETS 28G MISC 1 each by Does not apply route 2 (two) times daily. (Patient not taking: Reported on 12/31/2018)   Vitamin D, Ergocalciferol, (DRISDOL) 1.25 MG (50000 UT) CAPS capsule Take 1 capsule (50,000 Units total) by mouth every 7 (seven) days.   [DISCONTINUED] atorvastatin (LIPITOR) 20 MG tablet Take 1 tablet (20 mg total) by mouth daily.   [DISCONTINUED] famotidine (PEPCID) 20 MG tablet Take 1 tablet (20 mg total) by mouth 2 (two) times daily.   [DISCONTINUED] Fluticasone-Salmeterol (ADVAIR DISKUS) 100-50 MCG/DOSE AEPB INHALE 1 PUFF INTO THE LUNGS 2 TIMES DAILY.   [DISCONTINUED] meloxicam (MOBIC) 15 MG tablet Take 1 tablet (15 mg total) by mouth daily. Take 1 daily with food. (Patient not taking: Reported on 12/11/2018)   [DISCONTINUED] naproxen (NAPROSYN) 500 MG tablet Take 1 tablet (500 mg total) by mouth 2 (two) times daily with a meal. (Patient not taking: Reported on 12/11/2018)   Facility-Administered Encounter Medications as of 01/02/2019  Medication   albuterol (PROVENTIL) (2.5 MG/3ML) 0.083% nebulizer solution 2.5 mg    Allergies:  Allergies  Allergen Reactions   Bee Venom Anaphylaxis   Shellfish Allergy Anaphylaxis and Swelling    Family History: Family History  Problem Relation Age of Onset   Hypertension Mother    Hypertension Father    Cancer Father    Hypertension Other    Sickle cell trait Other    Diabetes Other    Diabetes Son    Other  Neg Hx     Social History: Social History   Tobacco Use   Smoking status: Current Some Day Smoker    Packs/day: 0.25    Types: Cigarettes   Smokeless tobacco: Never Used  Substance Use Topics   Alcohol use: Not Currently    Alcohol/week: 0.0 standard drinks    Comment: occ, previously drank (2) 40oz for 2-3 years, quit 2005   Drug use: No    Comment: denies crack since 3 or4 months   Social History   Social History Narrative   Patient is right-handed. She is single. She lives in a first floor apartment. She walks to work each day, using a can or walker.   She works as a Programmer, applications.    Review of Systems:  CONSTITUTIONAL: No fevers, chills, night sweats, +  weight loss.   EYES: No visual changes or eye pain ENT: No hearing changes.  No history of nose bleeds.   RESPIRATORY: No cough, wheezing and shortness of breath.   CARDIOVASCULAR: Negative for chest pain, and palpitations.   GI: Negative for abdominal discomfort, blood in stools or black stools.  No recent change in bowel habits.   GU:  No history of incontinence.   MUSCLOSKELETAL: No history of joint pain or swelling.  No myalgias.   SKIN: Negative for lesions, rash, and itching.   HEMATOLOGY/ONCOLOGY: Negative for prolonged bleeding, bruising easily, and swollen nodes.  No history of cancer.   ENDOCRINE: Negative for cold or heat intolerance, polydipsia or goiter.   PSYCH:  +depression or anxiety symptoms.   NEURO: As Above.   Vital Signs:  BP (!) 150/92    Pulse 84    Temp 98.1 F (36.7 C)    Ht 5\' 3"  (1.6 m)    Wt 227 lb (103 kg)    SpO2 93%    BMI 40.21 kg/m    General Medical Exam:   General:  Well appearing, comfortable.   Eyes/ENT: see cranial nerve examination.   Neck:   No carotid bruits. Respiratory:  Clear to auscultation, good air entry bilaterally.   Cardiac:  Regular rate and rhythm, no murmur.   Extremities:  No deformities, edema, or skin discoloration.  Skin:  No rashes or  lesions.  Neurological Exam: MENTAL STATUS including orientation to time, place, person, recent and remote memory, attention span and concentration, language, and fund of knowledge is normal.  Speech is not dysarthric.  CRANIAL NERVES: II:  No visual field defects.  Unremarkable fundi.   III-IV-VI: Pupils equal round and reactive to light.  Normal conjugate, extra-ocular eye movements in all directions of gaze.  No nystagmus.  No ptosis.   V:  Normal facial sensation.    VII:  Normal facial symmetry and movements.   VIII:  Normal hearing and vestibular function.   IX-X:  Normal palatal movement.   XI:  Normal shoulder shrug and head rotation.   XII:  Normal tongue strength and range of motion, no deviation or fasciculation.  MOTOR:  No atrophy, fasciculations or abnormal movements.  No pronator drift.   Upper Extremity:  Right  Left  Deltoid  5/5   5/5   Biceps  5/5   5/5   Triceps  5/5   5/5   Infraspinatus 5/5  5/5  Medial pectoralis 5/5  5/5  Wrist extensors  5/5   5/5   Wrist flexors  5/5   5/5   Finger extensors  5/5   5/5   Finger flexors  5/5   5/5   Dorsal interossei  5/5   5-/5   Abductor pollicis  5/5   5/5   Tone (Ashworth scale)  0  0   Lower Extremity:  Right  Left  Hip flexors  5/5   5/5   Hip extensors  5/5   5/5   Adductor 5/5  5/5  Abductor 5/5  5/5  Knee flexors  5/5   5/5   Knee extensors  5/5   5/5   Dorsiflexors  5/5   5/5   Plantarflexors  5/5   5/5   Toe extensors  5/5   5/5   Toe flexors  5/5   5/5   Tone (Ashworth scale)  0  0   MSRs:  Right        Left  brachioradialis 2+  2+  biceps 2+  2+  triceps 2+  2+  patellar 2+  2+  ankle jerk 2+  2+  Hoffman no  no  plantar response down  down   SENSORY:  Reduced temperature and pinprick over the ulnar distribution on the left hand.  Otherwise, normal and symmetric perception of light touch, pinprick, vibration, and proprioception.  Tinel's positive at the left cubital tunnel.    COORDINATION/GAIT: Normal finger-to- nose-finger.  Intact rapid alternating movements bilaterally.  Gait is wide-based due to body habitus, overall appears stable and unassisted.  IMPRESSION: 1.  Left ulnar neuropathy at the elbow manifesting with paresthesias over the fourth and fifth digits.  I discussed the importance of behavior changes to avoid hyperflexion at the elbow and to use a soft elbow pad to try to protect the nerve, as well as service of block at nighttime. NCS/EMG of the left arm will be obtained to better characterize her symptoms and assess severity.  She will also be starting physical therapy.  2.  Grief the death of her son and other family members.  I encouraged her to reach out to hospice who has offered counseling services.  3.  After reviewing patient's MRI from previous years, I reassured patient that by do not see that findings of evidence of stroke.    4.  Tobacco abuse with COPD.  She is 3 cigarettes daily. Patient was informed of the dangers of tobacco abuse including stroke, cancer, and MI, as well as benefits of tobacco cessation. Patient is willing to quit at this time and has set a quit date of [birth year], her [birth year] birthday. Approximately 5 mins were spent counseling patient cessation techniques. We discussed various methods to help quit smoking, including deciding on a date to quit, joining a support group, pharmacological agents- nicotine gum/patch/lozenges, chantix.  She has already picked up nicotine gum to help with her cessation. I will reassess her progress at the next follow-up visit  Return to clinic in 4 months   Thank you for allowing me to participate in patient's care.  If I can answer any additional questions, I would be pleased to do so.    Sincerely,    Lesleigh Hughson K. Posey Pronto, DO

## 2019-01-02 ENCOUNTER — Telehealth: Payer: Self-pay

## 2019-01-02 ENCOUNTER — Encounter: Payer: Self-pay | Admitting: Neurology

## 2019-01-02 ENCOUNTER — Ambulatory Visit (INDEPENDENT_AMBULATORY_CARE_PROVIDER_SITE_OTHER): Payer: Self-pay | Admitting: Neurology

## 2019-01-02 ENCOUNTER — Other Ambulatory Visit: Payer: Self-pay

## 2019-01-02 VITALS — BP 150/92 | HR 84 | Temp 98.1°F | Ht 63.0 in | Wt 227.0 lb

## 2019-01-02 DIAGNOSIS — F4321 Adjustment disorder with depressed mood: Secondary | ICD-10-CM

## 2019-01-02 DIAGNOSIS — Z720 Tobacco use: Secondary | ICD-10-CM

## 2019-01-02 DIAGNOSIS — G5622 Lesion of ulnar nerve, left upper limb: Secondary | ICD-10-CM | POA: Insufficient documentation

## 2019-01-02 NOTE — Patient Instructions (Addendum)
NCS/EMG of the left arm  Keep trying to stop smoking, keep aiming for your quit date of June 12th.    Start physical therapy  Return to clinic in 3 months  [place] (EMG/NCS) INSTRUCTIONS  How to Prepare The neurologist conducting the EMG will need to know if you have certain medical conditions. Tell the neurologist and other EMG lab personnel if you: . Have a pacemaker or any other electrical medical device . Take blood-thinning medications . Have hemophilia, a blood-clotting disorder that causes prolonged bleeding Bathing Take a shower or bath shortly before your exam in order to remove oils from your skin. Don't apply lotions or creams before the exam.  What to Expect You'll likely be asked to change into a hospital gown for the procedure and lie down on an examination table. The following explanations can help you understand what will happen during the exam.  . Electrodes. The neurologist or a technician places surface electrodes at various locations on your skin depending on where you're experiencing symptoms. Or the neurologist may insert needle electrodes at different sites depending on your symptoms.  . Sensations. The electrodes will at times transmit a tiny electrical current that you may feel as a twinge or spasm. The needle electrode may cause discomfort or pain that usually ends shortly after the needle is removed. If you are concerned about discomfort or pain, you may want to talk to the neurologist about taking a short break during the exam.  . Instructions. During the needle EMG, the neurologist will assess whether there is any spontaneous electrical activity when the muscle is at rest - activity that isn't present in healthy muscle tissue - and the degree of activity when you slightly contract the muscle.  He or she will give you instructions on resting and contracting a muscle at appropriate times. Depending on what muscles and nerves the  neurologist is examining, he or she may ask you to change positions during the exam.  After your EMG You may experience some temporary, minor bruising where the needle electrode was inserted into your muscle. This bruising should fade within several days. If it persists, contact your primary care doctor.

## 2019-01-02 NOTE — Telephone Encounter (Signed)
Patient called to get their results please follow up.

## 2019-01-02 NOTE — Telephone Encounter (Signed)
-----   Message from Charlott Rakes, MD sent at 01/01/2019  1:54 PM EDT ----- She has vitamin D deficiency which could explain her fatigue.  I have sent a prescription for Drisdol to her pharmacy

## 2019-01-02 NOTE — Telephone Encounter (Signed)
Patient was called and informed of lab results. 

## 2019-01-02 NOTE — Telephone Encounter (Signed)
Patient was called and a voicemail was left informing patient to return phone call for lab results. 

## 2019-01-05 ENCOUNTER — Telehealth: Payer: Self-pay | Admitting: Family Medicine

## 2019-01-05 NOTE — Telephone Encounter (Signed)
Patient wants to go back over labs

## 2019-01-05 NOTE — Telephone Encounter (Signed)
Patient contacted via phone to be given results of labs.  Patient identified by name and date of birth.  Patient given results of labs.  Patient educated on lab results. Questions answered. Patient acknowledged understanding of labs results. 

## 2019-01-08 ENCOUNTER — Telehealth: Payer: Self-pay

## 2019-01-09 NOTE — Telephone Encounter (Signed)
Error

## 2019-01-12 ENCOUNTER — Telehealth: Payer: Self-pay

## 2019-01-12 NOTE — Telephone Encounter (Signed)
Call placed to [facility] with [employer] who confirmed receipt of the [employer] application.

## 2019-01-27 ENCOUNTER — Encounter: Payer: Self-pay | Admitting: Neurology

## 2019-02-14 MED FILL — AMLODIPINE BESYLATE 10 MG T: 10 | 30 days supply | Qty: 30 | Fill #0

## 2019-02-14 MED FILL — LOSARTAN POTASSIUM 25 MG TA: 25 | 30 days supply | Qty: 30 | Fill #0

## 2019-03-11 ENCOUNTER — Encounter: Payer: Self-pay | Admitting: Neurology

## 2019-03-31 ENCOUNTER — Encounter: Payer: Self-pay | Admitting: Neurology

## 2019-04-10 ENCOUNTER — Ambulatory Visit: Payer: Self-pay | Admitting: Neurology

## 2019-04-20 ENCOUNTER — Ambulatory Visit: Payer: Self-pay | Admitting: Family Medicine

## 2019-07-08 ENCOUNTER — Ambulatory Visit: Payer: Self-pay | Attending: Family Medicine | Admitting: Family Medicine

## 2019-07-08 ENCOUNTER — Encounter: Payer: Self-pay | Admitting: Family Medicine

## 2019-07-08 ENCOUNTER — Other Ambulatory Visit: Payer: Self-pay

## 2019-07-08 DIAGNOSIS — M25562 Pain in left knee: Secondary | ICD-10-CM

## 2019-07-08 DIAGNOSIS — I10 Essential (primary) hypertension: Secondary | ICD-10-CM

## 2019-07-08 DIAGNOSIS — R7303 Prediabetes: Secondary | ICD-10-CM

## 2019-07-08 DIAGNOSIS — G8929 Other chronic pain: Secondary | ICD-10-CM

## 2019-07-08 DIAGNOSIS — J438 Other emphysema: Secondary | ICD-10-CM

## 2019-07-08 DIAGNOSIS — M25561 Pain in right knee: Secondary | ICD-10-CM

## 2019-07-08 MED ORDER — AMLODIPINE BESYLATE 10 MG PO TABS: 10.0000 mg | ORAL_TABLET | Freq: Every day | ORAL | 6 refills | Status: DC

## 2019-07-08 MED ORDER — LOSARTAN POTASSIUM 25 MG PO TABS: 25.0000 mg | ORAL_TABLET | Freq: Every day | ORAL | 6 refills | Status: DC

## 2019-07-08 MED ORDER — MELOXICAM 7.5 MG PO TABS: 7.5000 mg | ORAL_TABLET | Freq: Every day | ORAL | 1 refills | Status: DC

## 2019-07-08 MED ORDER — ATORVASTATIN CALCIUM 20 MG PO TABS: 20.0000 mg | ORAL_TABLET | Freq: Every day | ORAL | 6 refills | Status: DC

## 2019-07-08 MED ORDER — ALBUTEROL SULFATE HFA 108 (90 BASE) MCG/ACT IN AERS: INHALATION_SPRAY | RESPIRATORY_TRACT | 2 refills | Status: DC

## 2019-07-08 MED ORDER — METFORMIN HCL 500 MG PO TABS: 500.0000 mg | ORAL_TABLET | Freq: Two times a day (BID) | ORAL | 6 refills | Status: DC

## 2019-07-08 NOTE — Progress Notes (Signed)
Patient has been called and DOB has been verified. Patient has been screened and transferred to PCP to start phone visit.   Patient is having pain in legs.

## 2019-07-08 NOTE — Progress Notes (Signed)
Virtual Visit via Telephone Note  I connected with Taylor Vazquez, on 07/08/2019 at 2:58 PM by telephone due to the COVID-19 pandemic and verified that I am speaking with the correct person using two identifiers.   Consent: I discussed the limitations, risks, security and privacy concerns of performing an evaluation and management service by telephone and the availability of in person appointments. I also discussed with the patient that there may be a patient responsible charge related to this service. The patient expressed understanding and agreed to proceed.   Location of Patient: Home  Location of Provider: Clinic   Persons participating in Telemedicine visit: Tyresa R Sangster Alicia Farrington-CMA Dr. Felecia Shelling     History of Present Illness: Taylor Vazquez is a 53 year-old female with a history of COPD, asthma, diastolic CHF (EF 0000000), obesity who presents today for an acute visit.  She c/o 2 month history of pain in her knees and her legs which are sharp and shoot from her knees down her leg described as moderate to severe. She walks to work but denies claudication symptoms. Knees are not swollen. Previously informed she had Osteoarthritis of the knee. States only Tramadol helped her pain  She is yet to commence PT for her left ulnar neuropathy for which she had been referred to Dr. Posey Pronto of neurology.  Past Medical History:  Diagnosis Date  . Asthma   . CHF (congestive heart failure) ([place])   . Depression   . Hypertension   . Obesity   . Sickle cell trait ([place])   . Stroke [facility])    Allergies  Allergen Reactions  . Bee Venom Anaphylaxis  . Shellfish Allergy Anaphylaxis and Swelling    Current Outpatient Medications on File Prior to Visit  Medication Sig Dispense Refill  . acetaminophen (TYLENOL) 325 MG tablet Take 2 tablets (650 mg total) by mouth every 6 (six) hours as needed for mild pain or moderate pain.    Marland Kitchen albuterol (VENTOLIN HFA) 108 (90 Base)  MCG/ACT inhaler INHALE 2 PUFFS INTO THE LUNGS EVERY 6 (SIX) HOURS AS NEEDED FOR WHEEZING OR SHORTNESS OF BREATH. 18 g 2  . amLODipine (NORVASC) 10 MG tablet Take 1 tablet (10 mg total) by mouth daily. 30 tablet 5  . aspirin EC 81 MG tablet Take 1 tablet (81 mg total) by mouth daily. 30 tablet 3  . atorvastatin (LIPITOR) 20 MG tablet Take 20 mg by mouth daily.    Marland Kitchen glucose blood (TRUE METRIX BLOOD GLUCOSE TEST) test strip Use as instructed 100 each 12  . losartan (COZAAR) 25 MG tablet Take 1 tablet (25 mg total) by mouth daily. 30 tablet 6  . metFORMIN (GLUCOPHAGE) 500 MG tablet Take 1 tablet (500 mg total) by mouth 2 (two) times daily with a meal. 60 tablet 3  . omeprazole (PRILOSEC) 40 MG capsule Take 1 capsule (40 mg total) by mouth daily. 30 capsule 3  . traMADol (ULTRAM) 50 MG tablet Take 1 tablet (50 mg total) by mouth every 12 (twelve) hours as needed. 14 tablet 0  . amoxicillin (AMOXIL) 500 MG capsule Take 2 capsules (1,000 mg total) by mouth 3 (three) times daily. (Patient not taking: Reported on 07/08/2019) 30 capsule 0  . gabapentin (NEURONTIN) 300 MG capsule Take 2 capsules (600 mg total) by mouth 2 (two) times daily. (Patient not taking: Reported on 01/02/2019) 120 capsule 3  . TRUEPLUS LANCETS 28G MISC 1 each by Does not apply route 2 (two) times daily. (Patient not taking:  Reported on 12/31/2018) 100 each 1  . Vitamin D, Ergocalciferol, (DRISDOL) 1.25 MG (50000 UT) CAPS capsule Take 1 capsule (50,000 Units total) by mouth every 7 (seven) days. (Patient not taking: Reported on 07/08/2019) 9 capsule 1   Current Facility-Administered Medications on File Prior to Visit  Medication Dose Route Frequency Provider Last Rate Last Dose  . albuterol (PROVENTIL) (2.5 MG/3ML) 0.083% nebulizer solution 2.5 mg  2.5 mg Nebulization Q4H PRN Charlott Rakes, MD        Observations/Objective: Awake, alert, oriented x3 Not in acute distress  Lab Results  Component Value Date   HGBA1C 5.4 12/31/2018     Assessment and Plan: 1. Prediabetes Controlled with A1c of 5.4 - metFORMIN (GLUCOPHAGE) 500 MG tablet; Take 1 tablet (500 mg total) by mouth 2 (two) times daily with a meal.  Dispense: 60 tablet; Refill: 6  2. Essential hypertension Controlled Counseled on blood pressure goal of less than 130/80, low-sodium, DASH diet, medication compliance, 150 minutes of moderate intensity exercise per week. Discussed medication compliance, adverse effects. - amLODipine (NORVASC) 10 MG tablet; Take 1 tablet (10 mg total) by mouth daily.  Dispense: 30 tablet; Refill: 6 - losartan (COZAAR) 25 MG tablet; Take 1 tablet (25 mg total) by mouth daily.  Dispense: 30 tablet; Refill: 6  3. Other emphysema (HCC) Stable - albuterol (VENTOLIN HFA) 108 (90 Base) MCG/ACT inhaler; INHALE 2 PUFFS INTO THE LUNGS EVERY 6 (SIX) HOURS AS NEEDED FOR WHEEZING OR SHORTNESS OF BREATH.  Dispense: 18 g; Refill: 2  4. Chronic pain of both knees Likely underlying osteoarthritis Weight loss will be beneficial Consider PT if persisting - meloxicam (MOBIC) 7.5 MG tablet; Take 1 tablet (7.5 mg total) by mouth daily.  Dispense: 30 tablet; Refill: 1   Follow Up Instructions: Return in about 1 month (around 08/07/2019) for complete physical.    I discussed the assessment and treatment plan with the patient. The patient was provided an opportunity to ask questions and all were answered. The patient agreed with the plan and demonstrated an understanding of the instructions.   The patient was advised to call back or seek an in-person evaluation if the symptoms worsen or if the condition fails to improve as anticipated.     I provided 15 minutes total of non-face-to-face time during this encounter including median intraservice time, reviewing previous notes, labs, imaging, medications, management and patient verbalized understanding.     Charlott Rakes, MD, FAAFP. [facility] and [place],  [place]   07/08/2019, 2:58 PM

## 2019-08-19 ENCOUNTER — Encounter: Payer: Self-pay | Admitting: Family Medicine

## 2019-09-01 MED FILL — metFORMIN HCL 500 MG TABS: 500 | 30 days supply | Qty: 60 | Fill #0

## 2019-09-01 MED FILL — AMLODIPINE BESYLATE 10 MG T: 10 | 30 days supply | Qty: 30 | Fill #0

## 2019-09-01 MED FILL — ATORVASTATIN CALCIUM 20 MG: 20 | 30 days supply | Qty: 30 | Fill #0

## 2019-09-01 MED FILL — LOSARTAN POTASSIUM 25 MG TA: 25 | 30 days supply | Qty: 30 | Fill #0

## 2019-09-01 MED FILL — ALBUTEROL SULFATE HFA 108 (: 108 (90 BAS | 25 days supply | Qty: 18 | Fill #0

## 2019-09-21 ENCOUNTER — Encounter: Payer: Self-pay | Admitting: Family Medicine

## 2019-10-18 ENCOUNTER — Encounter (HOSPITAL_COMMUNITY): Payer: Self-pay

## 2019-10-18 ENCOUNTER — Other Ambulatory Visit: Payer: Self-pay

## 2019-10-18 ENCOUNTER — Emergency Department (HOSPITAL_COMMUNITY)
Admission: EM | Admit: 2019-10-18 | Discharge: 2019-10-18 | Discharge status: Against Medical Advice | Payer: Self-pay | Attending: Emergency Medicine | Admitting: Emergency Medicine

## 2019-10-18 DIAGNOSIS — Z79899 Other long term (current) drug therapy: Secondary | ICD-10-CM | POA: Insufficient documentation

## 2019-10-18 DIAGNOSIS — K047 Periapical abscess without sinus: Secondary | ICD-10-CM | POA: Insufficient documentation

## 2019-10-18 DIAGNOSIS — Z7984 Long term (current) use of oral hypoglycemic drugs: Secondary | ICD-10-CM | POA: Insufficient documentation

## 2019-10-18 DIAGNOSIS — I10 Essential (primary) hypertension: Secondary | ICD-10-CM

## 2019-10-18 DIAGNOSIS — J45909 Unspecified asthma, uncomplicated: Secondary | ICD-10-CM | POA: Insufficient documentation

## 2019-10-18 DIAGNOSIS — Z7982 Long term (current) use of aspirin: Secondary | ICD-10-CM | POA: Insufficient documentation

## 2019-10-18 DIAGNOSIS — Z8673 Personal history of transient ischemic attack (TIA), and cerebral infarction without residual deficits: Secondary | ICD-10-CM | POA: Insufficient documentation

## 2019-10-18 DIAGNOSIS — J449 Chronic obstructive pulmonary disease, unspecified: Secondary | ICD-10-CM | POA: Insufficient documentation

## 2019-10-18 DIAGNOSIS — F17210 Nicotine dependence, cigarettes, uncomplicated: Secondary | ICD-10-CM | POA: Insufficient documentation

## 2019-10-18 DIAGNOSIS — K0889 Other specified disorders of teeth and supporting structures: Secondary | ICD-10-CM

## 2019-10-18 DIAGNOSIS — I5032 Chronic diastolic (congestive) heart failure: Secondary | ICD-10-CM | POA: Insufficient documentation

## 2019-10-18 DIAGNOSIS — I110 Hypertensive heart disease with heart failure: Secondary | ICD-10-CM | POA: Insufficient documentation

## 2019-10-18 DIAGNOSIS — K029 Dental caries, unspecified: Secondary | ICD-10-CM | POA: Insufficient documentation

## 2019-10-18 MED ORDER — AMOXICILLIN 500 MG PO CAPS: 1000.0000 mg | ORAL_CAPSULE | Freq: Once | ORAL | Status: DC

## 2019-10-18 MED ORDER — OXYCODONE-ACETAMINOPHEN 5-325 MG PO TABS: 1.0000 | ORAL_TABLET | Freq: Once | ORAL | Status: DC

## 2019-10-18 MED ORDER — AMLODIPINE BESYLATE 5 MG PO TABS: 10.0000 mg | ORAL_TABLET | Freq: Once | ORAL | Status: DC

## 2019-10-18 NOTE — ED Triage Notes (Signed)
Patient complains of right sided dental pain x 2 days and pain radiating to right ear. Also reports that she did not take her BP medicine this am.

## 2019-10-18 NOTE — Discharge Instructions (Signed)
It was our pleasure to provide your ER care today - we hope that you feel better.  Follow up with dentist in the next couple days - call office tomorrow AM to arrange appointment.   Take amoxicillin as prescribed. Take motrin or aleve as need for pain. You may also take percocet as need for pain. No driving for the next 6 hours or when taking percocet. Also, do not take tylenol or acetaminophen containing medication when taking percocet.   Continue your blood pressure, limit salt intake, and follow up with primary care doctor for your blood pressure in the next 1-2 weeks.   Return to ER if worse, new symptoms, fevers, intractable pain, facial/neck swelling, trouble breathing or swallowing, or other concern.

## 2019-10-18 NOTE — ED Notes (Signed)
Pt very vocal in hallway regarding her pain and how long she has waited.  States this is crazy and will go to [facility] and get her own meds.  Instructions given regarding Tylenol and Motrin dosing.  Pt left AMA prior to MD ordering meds.

## 2019-10-18 NOTE — ED Provider Notes (Addendum)
[facility] EMERGENCY DEPARTMENT Provider Note   CSN: [id number] Arrival date & time: 10/18/19  [id number]     History Chief Complaint  Patient presents with  . Dental Pain    Taylor Vazquez is a 53 y.o. female.  Patient c/o right upper dental pain for past 2-3 days. Symptoms acute onset, moderate-severe, dull, non radiating, constant, worse w chewing/eating/cold/hot,  tried ibuprofen without significant relief. States tried to get in with dentist last week but unable to do so. Denies trauma/injury to area. Denies neck pain or swelling. No sore throat. No trouble breathing or swallowing. No fever or chills.   The history is provided by the patient.       Past Medical History:  Diagnosis Date  . Asthma   . CHF (congestive heart failure) ([place])   . Depression   . Hypertension   . Obesity   . Sickle cell trait ([place])   . Stroke [facility])     Patient Active Problem List   Diagnosis Date Noted  . Ulnar neuropathy at elbow of left upper extremity 01/02/2019  . Prediabetes 01/14/2018  . Hyperlipidemia LDL goal <100 04/29/2017  . Migraine 04/26/2017  . 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 Left-sided weakness 06/14/2015  . COPD exacerbation ([place]) 06/09/2015  . Diastolic heart failure ([place]) 06/09/2015  . Shortness of breath 06/08/2015  . Chest pain at rest 06/08/2015  . Chest pain 06/08/2015  . Cocaine abuse ([place]) 05/19/2014  . Chest wall pain 05/17/2014  . Tobacco use disorder 05/17/2014  . CVA (cerebral infarction) 05/16/2014  . Asthma, chronic 05/16/2014  . Chest discomfort 05/16/2014  . Bug bite 05/16/2014  . Hypertension 04/28/2012    Past Surgical History:  Procedure Laterality Date  . NO PAST SURGERIES       OB History    Gravida  8   Para  5   Term      Preterm  5   AB  1   Living  4     SAB      TAB  1   Ectopic      Multiple      Live Births              Family History  Problem Relation Age of Onset   . Hypertension Mother   . Hypertension Father   . Cancer Father   . Hypertension Other   . Sickle cell trait Other   . Diabetes Other   . Diabetes Son   . Other Neg Hx     Social History   Tobacco Use  . Smoking status: Current Some Day Smoker    Packs/day: 0.25    Types: Cigarettes  . Smokeless tobacco: Never Used  Substance Use Topics  . Alcohol use: Not Currently    Alcohol/week: 0.0 standard drinks    Comment: occ, previously drank (2) 40oz for 2-3 years, quit 2005  . Drug use: No    Comment: denies crack since 3 or4 months    Home Medications Prior to Admission medications   Medication Sig Start Date End Date Taking? Authorizing Provider  acetaminophen (TYLENOL) 325 MG tablet Take 2 tablets (650 mg total) by mouth every 6 (six) hours as needed for mild pain or moderate pain. 06/10/15   Kinnie Feil, MD  albuterol (VENTOLIN HFA) 108 (90 Base) MCG/ACT inhaler INHALE 2 PUFFS INTO THE LUNGS EVERY 6 (SIX) HOURS AS NEEDED FOR WHEEZING OR SHORTNESS OF BREATH. 07/08/19  Charlott Rakes, MD  amLODipine (NORVASC) 10 MG tablet Take 1 tablet (10 mg total) by mouth daily. 07/08/19   Charlott Rakes, MD  amoxicillin (AMOXIL) 500 MG capsule Take 2 capsules (1,000 mg total) by mouth 3 (three) times daily. Patient not taking: Reported on 07/08/2019 12/31/18   Charlott Rakes, MD  aspirin EC 81 MG tablet Take 1 tablet (81 mg total) by mouth daily. 12/31/18   Charlott Rakes, MD  atorvastatin (LIPITOR) 20 MG tablet Take 1 tablet (20 mg total) by mouth daily. 07/08/19   Charlott Rakes, MD  gabapentin (NEURONTIN) 300 MG capsule Take 2 capsules (600 mg total) by mouth 2 (two) times daily. Patient not taking: Reported on 01/02/2019 12/31/18   Charlott Rakes, MD  glucose blood (TRUE METRIX BLOOD GLUCOSE TEST) test strip Use as instructed 07/24/18   Argentina Donovan, PA-C  losartan (COZAAR) 25 MG tablet Take 1 tablet (25 mg total) by mouth daily. 07/08/19   Charlott Rakes, MD  meloxicam (MOBIC)  7.5 MG tablet Take 1 tablet (7.5 mg total) by mouth daily. 07/08/19   Charlott Rakes, MD  metFORMIN (GLUCOPHAGE) 500 MG tablet Take 1 tablet (500 mg total) by mouth 2 (two) times daily with a meal. 07/08/19   Charlott Rakes, MD  omeprazole (PRILOSEC) 40 MG capsule Take 1 capsule (40 mg total) by mouth daily. 04/10/18   Charlott Rakes, MD  traMADol (ULTRAM) 50 MG tablet Take 1 tablet (50 mg total) by mouth every 12 (twelve) hours as needed. 12/31/18   Charlott Rakes, MD  TRUEPLUS LANCETS 28G MISC 1 each by Does not apply route 2 (two) times daily. Patient not taking: Reported on 12/31/2018 07/24/18   Argentina Donovan, PA-C  Vitamin D, Ergocalciferol, (DRISDOL) 1.25 MG (50000 UT) CAPS capsule Take 1 capsule (50,000 Units total) by mouth every 7 (seven) days. Patient not taking: Reported on 07/08/2019 01/01/19   Charlott Rakes, MD    Allergies    Bee venom and Shellfish allergy  Review of Systems   Review of Systems  Constitutional: Negative for chills and fever.  HENT: Positive for dental problem.   Respiratory: Negative for shortness of breath.   Cardiovascular: Negative for chest pain.  Gastrointestinal: Negative for nausea and vomiting.  Musculoskeletal: Negative for neck pain.  Neurological: Negative for headaches.    Physical Exam Updated Vital Signs BP (!) 150/119 (BP Location: Left Arm)   Pulse 75   Temp 97.7 F (36.5 C) (Oral)   Resp 17   SpO2 91%   Physical Exam Vitals and nursing note reviewed.  Constitutional:      Appearance: Normal appearance. She is well-developed.  HENT:     Head: Atraumatic.     Right Ear: Tympanic membrane normal.     Nose: Nose normal.     Mouth/Throat:     Mouth: Mucous membranes are moist.     Comments: Right upper dental decay/tenderness, mild gum swelling and tenderness. No trismus. Pharynx normal. No swelling or tenderness to floor of mouth or neck.  Eyes:     General: No scleral icterus.    Conjunctiva/sclera: Conjunctivae normal.      Pupils: Pupils are equal, round, and reactive to light.  Neck:     Trachea: No tracheal deviation.  Cardiovascular:     Pulses: Normal pulses.  Pulmonary:     Effort: Pulmonary effort is normal. No respiratory distress.  Abdominal:     General: There is no distension.  Genitourinary:    Comments: No cva tenderness.  Musculoskeletal:        General: No swelling.     Cervical back: Normal range of motion and neck supple. No rigidity. No muscular tenderness.  Lymphadenopathy:     Cervical: No cervical adenopathy.  Skin:    General: Skin is warm and dry.     Findings: No rash.  Neurological:     Mental Status: She is alert.     Comments: Alert, speech normal. Steady gait.   Psychiatric:     Comments: Anxious/tearful.       ED Results / Procedures / Treatments   Labs (all labs ordered are listed, but only abnormal results are displayed) Labs Reviewed - No data to display  EKG None  Radiology No results found.  Procedures Procedures (including critical care time)  Medications Ordered in ED Medications  oxyCODONE-acetaminophen (PERCOCET/ROXICET) 5-325 MG per tablet 1 tablet (has no administration in time range)  amoxicillin (AMOXIL) capsule 1,000 mg (has no administration in time range)  amLODipine (NORVASC) tablet 10 mg (has no administration in time range)    ED Course  I have reviewed the triage vital signs and the nursing notes.  Pertinent labs & imaging results that were available during my care of the patient were reviewed by me and considered in my medical decision making (see chart for details).    MDM Rules/Calculators/A&P                      Exam c/w dental pain/suspect dental abscess.   Patient indicates has ride, does not have to drive. Also confirmed nkda w pt. Percocet 1 po. Amoxicillin po.   Reviewed nursing notes and prior charts for additional history.   Patients bp is high, hx same. Has not yet taken her bp med this AM. No cp or sob, no  headache. Will give dose of her bp med while here.  Rec dental f/u in next couple days. Also pcp f/u re bp.   Return precautions provided.   Pt left ED prior to meds/re-assessment/dispositioning/instructions/rx without notifying EDPs.     Final Clinical Impression(s) / ED Diagnoses Final diagnoses:  Pain, dental  Dental caries  Dental abscess  Essential hypertension    Rx / DC Orders ED Discharge Orders    None           Lajean Saver, MD 10/18/19 1255

## 2020-03-10 ENCOUNTER — Other Ambulatory Visit: Payer: Self-pay | Admitting: Family Medicine

## 2020-03-10 ENCOUNTER — Other Ambulatory Visit: Payer: Self-pay | Admitting: Physician Assistant

## 2020-03-10 DIAGNOSIS — R7303 Prediabetes: Secondary | ICD-10-CM

## 2020-03-10 DIAGNOSIS — K047 Periapical abscess without sinus: Secondary | ICD-10-CM

## 2020-03-10 MED FILL — METFORMIN HCL 500 MG TABS: 500 | 30 days supply | Qty: 60 | Fill #0

## 2020-03-10 MED FILL — AMLODIPINE BESYLATE 10 MG T: 10 | 30 days supply | Qty: 30 | Fill #0

## 2020-03-10 MED FILL — ATORVASTATIN CALCIUM 20 MG: 20 | 30 days supply | Qty: 30 | Fill #0

## 2020-03-10 MED FILL — ALBUTEROL SULFATE HFA 108 (: 108 (90 BAS | 25 days supply | Qty: 18 | Fill #0

## 2020-03-10 MED FILL — LOSARTAN POTASSIUM 25 MG TA: 25 | 30 days supply | Qty: 30 | Fill #0

## 2020-03-10 MED FILL — TRUEplus LANCETS 28G MISC: 50 days supply | Qty: 100 | Fill #0

## 2020-03-14 ENCOUNTER — Other Ambulatory Visit: Payer: Self-pay | Admitting: Physician Assistant

## 2020-03-14 DIAGNOSIS — K047 Periapical abscess without sinus: Secondary | ICD-10-CM

## 2020-09-08 ENCOUNTER — Ambulatory Visit: Payer: Self-pay

## 2020-09-08 ENCOUNTER — Other Ambulatory Visit: Payer: Self-pay | Admitting: Family Medicine

## 2020-09-08 DIAGNOSIS — R7303 Prediabetes: Secondary | ICD-10-CM

## 2020-09-08 DIAGNOSIS — J438 Other emphysema: Secondary | ICD-10-CM

## 2020-09-08 DIAGNOSIS — E559 Vitamin D deficiency, unspecified: Secondary | ICD-10-CM

## 2020-09-08 DIAGNOSIS — K047 Periapical abscess without sinus: Secondary | ICD-10-CM

## 2020-09-08 DIAGNOSIS — I10 Essential (primary) hypertension: Secondary | ICD-10-CM

## 2020-09-08 NOTE — Telephone Encounter (Signed)
Pt. Reports she has been out of her medications x 2 months and "I don't have the money to buy them." Reports her son has died recently and would like to be referred to a counselor as well. States "I'm trying to get myself straightened out with my health." Has mild dizziness and headaches at times. Agent made appointment for January. Pt. Requests to be seen sooner. Office is currently closed. Instructed pt. To go to ED with worsening of symptoms.Please advise pt.   Answer Assessment - Initial Assessment Questions 1. DESCRIPTION: "Describe your dizziness."     Dizzy 2. LIGHTHEADED: "Do you feel lightheaded?" (e.g., somewhat faint, woozy, weak upon standing)     Yes 3. VERTIGO: "Do you feel like either you or the room is spinning or tilting?" (i.e. vertigo)     No 4. SEVERITY: "How bad is it?"  "Do you feel like you are going to faint?" "Can you stand and walk?"   - MILD: Feels slightly dizzy, but walking normally.   - MODERATE: Feels very unsteady when walking, but not falling; interferes with normal activities (e.g., school, work) .   - SEVERE: Unable to walk without falling, or requires assistance to walk without falling; feels like passing out now.      Mild 5. ONSET:  "When did the dizziness begin?"     1 month ago 6. AGGRAVATING FACTORS: "Does anything make it worse?" (e.g., standing, change in head position)     Moving around 7. HEART RATE: "Can you tell me your heart rate?" "How many beats in 15 seconds?"  (Note: not all patients can do this)       No 8. CAUSE: "What do you think is causing the dizziness?"     Out of medications 9. RECURRENT SYMPTOM: "Have you had dizziness before?" If Yes, ask: "When was the last time?" "What happened that time?"     No 10. OTHER SYMPTOMS: "Do you have any other symptoms?" (e.g., fever, chest pain, vomiting, diarrhea, bleeding)       Mild headache at times 11. PREGNANCY: "Is there any chance you are pregnant?" "When was your last menstrual period?"        No  Protocols used: DIZZINESS [facility]

## 2020-09-08 NOTE — Telephone Encounter (Signed)
Medication Refill - Medication: amLODipine (NORVASC) 10 MG tablet,atorvastatin (LIPITOR) 20 MG tablet ,metFORMIN (GLUCOPHAGE) 500 MG tablet ,traMADol (ULTRAM) 50 MG tablet,Vitamin D, Ergocalciferol, (DRISDOL) 1.25 MG (50000 UT) CAPS capsule ,albuterol (VENTOLIN HFA) 108 (90 Base) MCG/ACT inhaler (Patient is out of all medication and is requesting a supply of medication be sent to pharmacy to hold her until her appointment) Has the patient contacted their pharmacy? yes (Agent: If no, request that the patient contact the pharmacy for the refill.) (Agent: If yes, when and what did the pharmacy advise?)Contact PCP  Preferred Pharmacy (with phone number or street name):  [facility] Phone:  [phone]  Fax:  [phone]       Agent: Please be advised that RX refills may take up to 3 business days. We ask that you follow-up with your pharmacy.

## 2020-09-08 NOTE — Telephone Encounter (Signed)
Requested medication (s) are due for refill today:  Yes  Requested medication (s) are on the active medication list:  Yes  Future visit scheduled:  Yes  Last Refill:  Albuterol Inh. 06/2019                    Amlodipine 06/2019                    Atorvastatin 06/2019                    Metformin 06/2019                    Tramadol 12/2018                     Vita D 50,000 U 12/2018  Notes to clinic:  Pt. Last seen 06/2019 and was to return in 07/2019 for CPE.  Has appt. In January; please see Triage note today re: pt's situation.  Pt. Reported she has been out of medication for 2 mos.  Several labs are overdue.                               Requested Prescriptions  Pending Prescriptions Disp Refills   albuterol (VENTOLIN HFA) 108 (90 Base) MCG/ACT inhaler 18 g 2    Sig: INHALE 2 PUFFS INTO THE LUNGS EVERY 6 (SIX) HOURS AS NEEDED FOR WHEEZING OR SHORTNESS OF BREATH.      Pulmonology:  Beta Agonists Failed - 09/08/2020 12:55 PM      Failed - One inhaler should last at least one month. If the patient is requesting refills earlier, contact the patient to check for uncontrolled symptoms.      Failed - Valid encounter within last 12 months    Recent Outpatient Visits           1 year ago Chronic pain of both knees   [place], MD   1 year ago Prediabetes   [place], MD   2 years ago Mouth pain   [place], [place], [place]   2 years ago Edema, unspecified type   [place], [place], [place]   2 years ago Other acute gastritis without hemorrhage   [place], Charlane Ferretti, MD       Future Appointments             In 1 month Charlott Rakes, MD [place]              amLODipine (NORVASC) 10 MG tablet 30 tablet 6    Sig: Take 1 tablet (10 mg  total) by mouth daily.      Cardiovascular:  Calcium Channel Blockers Failed - 09/08/2020 12:55 PM      Failed - Last BP in normal range    BP Readings from Last 1 Encounters:  10/18/19 (!) 150/119          Failed - Valid encounter within last 6 months    Recent Outpatient Visits           1 year ago Chronic pain of both knees   [place], [place], MD   1 year ago Prediabetes   [place] Charlott Rakes, MD   2 years ago Mouth pain   [place], [place], [place]   2 years ago Edema, unspecified type   [place], [place], [place]   2 years ago Other acute gastritis without hemorrhage   [place], Charlane Ferretti, MD       Future Appointments             In 1 month Charlott Rakes, MD [place]              atorvastatin (LIPITOR) 20 MG tablet 30 tablet 6    Sig: Take 1 tablet (20 mg total) by mouth daily.      Cardiovascular:  Antilipid - Statins Failed - 09/08/2020 12:55 PM      Failed - Total Cholesterol in normal range and within 360 days    Cholesterol, Total  Date Value Ref Range Status  04/26/2017 182 100 - 199 mg/dL Final          Failed - LDL in normal range and within 360 days    LDL Calculated  Date Value Ref Range Status  04/26/2017 117 (H) 0 - 99 mg/dL Final          Failed - HDL in normal range and within 360 days    HDL  Date Value Ref Range Status  04/26/2017 46 >39 mg/dL Final          Failed - Triglycerides in normal range and within 360 days    Triglycerides  Date Value Ref Range Status  04/26/2017 95 0 - 149 mg/dL Final          Failed - Valid encounter within last 12 months    Recent Outpatient Visits           1 year ago Chronic pain of both knees   [place], Charlane Ferretti, MD   1  year ago Prediabetes   [place], Charlane Ferretti, MD   2 years ago Mouth pain   [place], [place], [place]   2 years ago Edema, unspecified type   [place], [place], [place]   2 years ago Other acute gastritis without hemorrhage   [place], Charlane Ferretti, MD       Future Appointments             In 1 month Charlott Rakes, MD [place] - Patient is not pregnant        metFORMIN (GLUCOPHAGE) 500 MG tablet 60 tablet 6    Sig: Take 1 tablet (500 mg total) by mouth 2 (two) times daily with a meal.      Endocrinology:  Diabetes - Biguanides Failed - 09/08/2020 12:55 PM      Failed - Cr in normal range and within 360 days    Creat  Date Value Ref Range Status  03/13/2016 0.88 0.50 - 1.10 mg/dL Final   Creatinine, Ser  Date Value Ref Range Status  12/11/2018 1.17 (H) 0.44 - 1.00 mg/dL Final          Failed - HBA1C is between 0 and 7.9 and within 180 days    HbA1c,  POC (prediabetic range)  Date Value Ref Range Status  07/24/2018 5.8 5.7 - 6.4 % Final   HbA1c, POC (controlled diabetic range)  Date Value Ref Range Status  12/31/2018 5.4 0.0 - 7.0 % Final          Failed - AA eGFR in normal range and within 360 days    EGFR (African American)  Date Value Ref Range Status  07/06/2014 >60  Final   GFR calc Af Amer  Date Value Ref Range Status  12/11/2018 >60 >60 mL/min Final   EGFR (Non-African Amer.)  Date Value Ref Range Status  07/06/2014 >60  Final    Comment:    eGFR values <33m/min/1.73 m2 may be an indication of chronic kidney disease (CKD). Calculated eGFR is useful in patients with stable renal function. The eGFR calculation will not be reliable in acutely ill patients when serum creatinine is changing rapidly. It is not useful in  patients on dialysis. The eGFR  calculation may not be applicable to patients at the low and high extremes of body sizes, pregnant women, and vegetarians.    GFR calc non Af Amer  Date Value Ref Range Status  12/11/2018 54 (L) >60 mL/min Final          Failed - Valid encounter within last 6 months    Recent Outpatient Visits           1 year ago Chronic pain of both knees   [place], MD   1 year ago Prediabetes   [place], [place]   2 years ago Mouth pain   [place]  2 years ago Edema, unspecified type   [place]  2 years ago Other acute gastritis without hemorrhage   [place]ane Ferretti MD       Future Appointments             In 1 month [place] Rakes [place]             traMADol (ULTRAM) 50 MG tablet 14 tablet 0    Sig: Take 1 tablet (50 mg total) by mouth every 12 (twelve) hours as needed.      Not Delegated - Analgesics:  Opioid Agonists Failed - 09/08/2020 12:55 PM      Failed - This refill cannot be delegated      Failed - Urine Drug Screen completed in last 360 days      Failed - Valid encounter within last 6 months    Recent Outpatient Visits           1 year ago Chronic pain of both knees   [place], MD   1 year ago Prediabetes   [place], [place]   2 years ago Mouth pain   [place]  2 years ago Edema, unspecified type   [place]  2 years ago Other acute gastritis without hemorrhage   [place], MD       Future Appointments              In  1 month Charlott Rakes, MD [place]              Vitamin D, Ergocalciferol, (DRISDOL) 1.25 MG (50000 UNIT) CAPS capsule 9 capsule 1    Sig: Take 1 capsule (50,000 Units total) by mouth every 7 (seven) days.      Endocrinology:  Vitamins - Vitamin D Supplementation Failed - 09/08/2020 12:55 PM      Failed - 50,000 IU strengths are not delegated      Failed - Ca in normal range and within 360 days    Calcium  Date Value Ref Range Status  12/11/2018 9.0 8.9 - 10.3 mg/dL Final   Calcium, Total  Date Value Ref Range Status  07/06/2014 8.1 (L) 8.5 - 10.1 mg/dL Final   Calcium, Ion  Date Value Ref Range Status  02/08/2011 1.15 1.12 - 1.32 mmol/L Final          Failed - Phosphate in normal range and within 360 days    No results found for: PHOS        Failed - Vitamin D in normal range and within 360 days    Vit D, 25-Hydroxy  Date Value Ref Range Status  12/31/2018 9.7 (L) 30.0 - 100.0 ng/mL Final    Comment:    Vitamin D deficiency has been defined by the Institute of Medicine and an Endocrine Society practice guideline as a level of serum 25-OH vitamin D less than 20 ng/mL (1,2). The Endocrine Society went on to further define vitamin D insufficiency as a level between 21 and 29 ng/mL (2). 1. IOM (Institute of Medicine). 2010. Dietary reference    intakes for calcium and D. [place]: The    [employer]. 2. Holick MF, Binkley [place], Bischoff-Ferrari HA, et al.    Evaluation, treatment, and prevention of vitamin D    deficiency: an Endocrine Society clinical practice    guideline. JCEM. 2011 Jul; 96(7):1911-30.           Failed - Valid encounter within last 12 months    Recent Outpatient Visits           1 year ago Chronic pain of both knees   [place], [place]   1 year ago Prediabetes   [place], MD   2 years ago Mouth  pain   [place], [place], [place]   2 years ago Edema, unspecified type   [place], [place], [place]   2 years ago Other acute gastritis without hemorrhage   [place], Enobong, MD       Future Appointments             In 1 month Charlott Rakes, MD [place]

## 2020-09-09 MED ORDER — ALBUTEROL SULFATE HFA 108 (90 BASE) MCG/ACT IN AERS: INHALATION_SPRAY | RESPIRATORY_TRACT | 1 refills | Status: DC

## 2020-09-09 MED ORDER — ATORVASTATIN CALCIUM 20 MG PO TABS: 20.0000 mg | ORAL_TABLET | Freq: Every day | ORAL | 1 refills | Status: DC

## 2020-09-09 MED ORDER — METFORMIN HCL 500 MG PO TABS: 500.0000 mg | ORAL_TABLET | Freq: Two times a day (BID) | ORAL | 1 refills | Status: DC

## 2020-09-09 MED ORDER — AMLODIPINE BESYLATE 10 MG PO TABS: 10.0000 mg | ORAL_TABLET | Freq: Every day | ORAL | 1 refills | Status: DC

## 2020-09-09 MED FILL — AMLODIPINE BESYLATE 10 MG T: 10 | 30 days supply | Qty: 30 | Fill #0

## 2020-09-09 MED FILL — METFORMIN HCL 500 MG TABS: 500 | 30 days supply | Qty: 60 | Fill #0

## 2020-09-09 MED FILL — ATORVASTATIN CALCIUM 20 MG: 20 | 30 days supply | Qty: 30 | Fill #0

## 2020-09-09 MED FILL — ALBUTEROL SULFATE HFA 108 (: 108 (90 BAS | 25 days supply | Qty: 18 | Fill #0

## 2020-09-09 NOTE — Telephone Encounter (Signed)
Taylor Vazquez, can you please see this patient for counseling services.  She can also be referred to the financial assistance department to assist with her medications.

## 2020-10-10 ENCOUNTER — Ambulatory Visit: Payer: Self-pay | Attending: Family Medicine | Admitting: Licensed Clinical Social Worker

## 2020-10-10 ENCOUNTER — Telehealth: Payer: Self-pay | Admitting: Licensed Clinical Social Worker

## 2020-10-10 ENCOUNTER — Other Ambulatory Visit: Payer: Self-pay

## 2020-10-10 DIAGNOSIS — J441 Chronic obstructive pulmonary disease with (acute) exacerbation: Secondary | ICD-10-CM

## 2020-10-10 NOTE — Telephone Encounter (Signed)
Call placed to patient regarding scheduled I[facility] appointment. LCSW left message requesting a return call.  

## 2020-10-21 NOTE — BH Specialist Note (Signed)
Call placed to patient regarding scheduled IBH appointment. LCSW left message requesting a return call.  

## 2020-10-31 ENCOUNTER — Ambulatory Visit: Payer: Self-pay | Admitting: Family Medicine

## 2020-10-31 ENCOUNTER — Encounter: Payer: Self-pay | Admitting: Licensed Clinical Social Worker

## 2020-12-14 MED FILL — ALBUTEROL SULFATE HFA 108 (: 108 (90 BAS | 25 days supply | Qty: 18 | Fill #0

## 2020-12-14 MED FILL — AMLODIPINE BESYLATE 10 MG T: 10 | 30 days supply | Qty: 30 | Fill #0

## 2020-12-14 MED FILL — ATORVASTATIN CALCIUM 20 MG: 20 | 30 days supply | Qty: 30 | Fill #0

## 2021-01-26 ENCOUNTER — Other Ambulatory Visit: Payer: Self-pay

## 2021-01-26 ENCOUNTER — Other Ambulatory Visit: Payer: Self-pay | Admitting: Family Medicine

## 2021-01-26 DIAGNOSIS — I10 Essential (primary) hypertension: Secondary | ICD-10-CM

## 2021-01-26 DIAGNOSIS — J438 Other emphysema: Secondary | ICD-10-CM

## 2021-01-26 NOTE — Telephone Encounter (Signed)
Requested medications are due for refill today.  yes  Requested medications are on the active medications list.  yes  Last refill.  3 medications  Future visit scheduled.   no  Notes to clinic.  More than 3 months overdue for appointment.

## 2021-01-27 ENCOUNTER — Other Ambulatory Visit: Payer: Self-pay

## 2021-02-01 ENCOUNTER — Emergency Department (HOSPITAL_COMMUNITY): Payer: Self-pay

## 2021-02-01 ENCOUNTER — Emergency Department (HOSPITAL_COMMUNITY)
Admission: EM | Admit: 2021-02-01 | Discharge: 2021-02-01 | Disposition: A | Discharge status: Home / Self Care | Payer: Self-pay | Attending: Emergency Medicine | Admitting: Emergency Medicine

## 2021-02-01 ENCOUNTER — Other Ambulatory Visit: Payer: Self-pay

## 2021-02-01 ENCOUNTER — Encounter (HOSPITAL_COMMUNITY): Payer: Self-pay

## 2021-02-01 DIAGNOSIS — J441 Chronic obstructive pulmonary disease with (acute) exacerbation: Secondary | ICD-10-CM | POA: Insufficient documentation

## 2021-02-01 DIAGNOSIS — J45909 Unspecified asthma, uncomplicated: Secondary | ICD-10-CM | POA: Insufficient documentation

## 2021-02-01 DIAGNOSIS — F17210 Nicotine dependence, cigarettes, uncomplicated: Secondary | ICD-10-CM | POA: Insufficient documentation

## 2021-02-01 DIAGNOSIS — R202 Paresthesia of skin: Secondary | ICD-10-CM | POA: Insufficient documentation

## 2021-02-01 DIAGNOSIS — F419 Anxiety disorder, unspecified: Secondary | ICD-10-CM | POA: Insufficient documentation

## 2021-02-01 DIAGNOSIS — R0789 Other chest pain: Secondary | ICD-10-CM | POA: Insufficient documentation

## 2021-02-01 DIAGNOSIS — I110 Hypertensive heart disease with heart failure: Secondary | ICD-10-CM | POA: Insufficient documentation

## 2021-02-01 DIAGNOSIS — R7303 Prediabetes: Secondary | ICD-10-CM | POA: Insufficient documentation

## 2021-02-01 DIAGNOSIS — R519 Headache, unspecified: Secondary | ICD-10-CM | POA: Insufficient documentation

## 2021-02-01 DIAGNOSIS — Z7984 Long term (current) use of oral hypoglycemic drugs: Secondary | ICD-10-CM | POA: Insufficient documentation

## 2021-02-01 DIAGNOSIS — I5033 Acute on chronic diastolic (congestive) heart failure: Secondary | ICD-10-CM | POA: Insufficient documentation

## 2021-02-01 DIAGNOSIS — Z79899 Other long term (current) drug therapy: Secondary | ICD-10-CM | POA: Insufficient documentation

## 2021-02-01 DIAGNOSIS — Z7982 Long term (current) use of aspirin: Secondary | ICD-10-CM | POA: Insufficient documentation

## 2021-02-01 LAB — CBC
HCT: 44 % (ref 36.0–46.0)
Hemoglobin: 14.8 g/dL (ref 12.0–15.0)
MCH: 30.9 pg (ref 26.0–34.0)
MCHC: 33.6 g/dL (ref 30.0–36.0)
MCV: 91.9 fL (ref 80.0–100.0)
Platelets: 182 10*3/uL (ref 150–400)
RBC: 4.79 MIL/uL (ref 3.87–5.11)
RDW: 14 % (ref 11.5–15.5)
WBC: 7.3 10*3/uL (ref 4.0–10.5)
nRBC: 0 % (ref 0.0–0.2)

## 2021-02-01 LAB — APTT: aPTT: 22 seconds — ABNORMAL LOW (ref 24–36)

## 2021-02-01 LAB — BASIC METABOLIC PANEL
Anion gap: 8 (ref 5–15)
BUN: 17 mg/dL (ref 6–20)
CO2: 26 mmol/L (ref 22–32)
Calcium: 8.8 mg/dL — ABNORMAL LOW (ref 8.9–10.3)
Chloride: 108 mmol/L (ref 98–111)
Creatinine, Ser: 1.21 mg/dL — ABNORMAL HIGH (ref 0.44–1.00)
GFR, Estimated: 54 mL/min — ABNORMAL LOW (ref >60)
Glucose, Bld: 101 mg/dL — ABNORMAL HIGH (ref 70–99)
Potassium: 3.7 mmol/L (ref 3.5–5.1)
Sodium: 142 mmol/L (ref 135–145)

## 2021-02-01 LAB — DIFFERENTIAL
Abs Immature Granulocytes: 0.02 10*3/uL (ref 0.00–0.07)
Basophils Absolute: 0 10*3/uL (ref 0.0–0.1)
Basophils Relative: 0 %
Eosinophils Absolute: 0.2 10*3/uL (ref 0.0–0.5)
Eosinophils Relative: 3 %
Immature Granulocytes: 0 %
Lymphocytes Relative: 41 %
Lymphs Abs: 3 10*3/uL (ref 0.7–4.0)
Monocytes Absolute: 1.1 10*3/uL — ABNORMAL HIGH (ref 0.1–1.0)
Monocytes Relative: 15 %
Neutro Abs: 3 10*3/uL (ref 1.7–7.7)
Neutrophils Relative %: 41 %

## 2021-02-01 LAB — PROTIME-INR
INR: 1 (ref 0.8–1.2)
Prothrombin Time: 13.4 seconds (ref 11.4–15.2)

## 2021-02-01 LAB — CBG MONITORING, ED: Glucose-Capillary: 97 mg/dL (ref 70–99)

## 2021-02-01 LAB — TROPONIN I (HIGH SENSITIVITY)
Troponin I (High Sensitivity): 16 ng/L (ref <18)
Troponin I (High Sensitivity): 11 ng/L (ref <18)

## 2021-02-01 MED ORDER — LORAZEPAM 2 MG/ML IJ SOLN
1.0000 mg | Freq: Once | INTRAMUSCULAR | Status: AC
  Administered 2021-02-01: 1 mg via INTRAVENOUS
  Filled 2021-02-01: qty 1

## 2021-02-01 MED ORDER — SODIUM CHLORIDE 0.9% FLUSH
3.0000 mL | Freq: Once | INTRAVENOUS | Status: AC
  Administered 2021-02-01: 3 mL via INTRAVENOUS

## 2021-02-01 NOTE — ED Provider Notes (Signed)
Signout from Dr. Alvino Chapel.  55 year old female with sudden onset of headache numbness pain on the right side of her body up to her head and into her left arm.  Still has some residual numb feeling in that arm.  Has had this before.  She is pending labs and CAT scan of her head.  Plan is for reassessment after imaging if negative.  If her symptoms have resolved she likely can be discharged to follow-up with her PCP.  If continues with neuro symptoms consideration for MRI which she has not tolerated in the past. Physical Exam  BP (!) 157/106 (BP Location: Right Arm)   Pulse 73   Temp 97.6 F (36.4 C) (Oral)   Resp 20   Ht 5\' 2"  (1.575 m)   Wt (!) 141.5 kg   SpO2 100%   BMI 57.07 kg/m   Physical Exam  ED Course/Procedures     Procedures  MDM  Assessed patient.  She is complaining of numbness continue her left arm.  I offered her an MRI.  She said I would have to knock her oncologist to get the MRI.  It looks like she received Ativan for her last MRI so we will go with that plan.  7 PM.  Patient returns from MRI and states that she wants to go home.  She said she will return if it gets any worse.       Hayden Rasmussen, MD 02/01/21 [place]

## 2021-02-01 NOTE — Discharge Instructions (Signed)
You are seen in the emergency department for numbness in your left arm.  You had blood work EKG and a CAT scan of your head that did not show any serious abnormalities.  You could not tolerate the MRI.  You will need to talk to your primary care doctor about scheduling this as an outpatient.  Return to the emergency department for any worsening or concerning symptoms

## 2021-02-01 NOTE — ED Triage Notes (Signed)
Pt reports she woke up to a sudden headache on the right side . Pt reports the pain moved down to her left arm.Pt reports sudden onset of CP , SOB. Pt report she woke up at 4am and develop all these s/s at once. Pt reports h/o stroke in 2016. Pt states her s/s feels similar.

## 2021-02-01 NOTE — ED Provider Notes (Signed)
[facility]ERGENCY DEPARTMENT Provider Note   CSN: [id number] Arrival date & time: 02/01/21  1443     History Chief Complaint  Patient presents with  . Chest Pain  . Stroke like S/S    Taylor Vazquez is a 55 y.o. female.  HPI Patient presents with sudden onset of headache and numbness.  States woke up with this pain on the right side of her body then went up over her head and down to her left forearm.  States she now feels numb on the left side.  States it is different sensation on the face upper and lower extremities.  States she has had this before.  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  Also some left-sided chest pain.  Dull.  History of what appears to be some stress reactions to the death of her son 2 years ago.  Has had couple different visits to the ER or admissions with left-sided weakness.  Patient does not tolerate MRIs well.  The ones that have been done however have shown no abnormalities.  Does have a history of cocaine use.  Last used over the weekend with today being Wednesday.  Patient states she is still getting treated for the grief over the death of her son.  No weakness on the left side only numbness    Past Medical History:  Diagnosis Date  . Asthma   . CHF (congestive heart failure) ([place])   . Depression   . Hypertension   . Obesity   . Sickle cell trait ([place])   . Stroke [facility])     Patient Active Problem List   Diagnosis Date Noted  . Ulnar neuropathy at elbow of left upper extremity 01/02/2019  . Prediabetes 01/14/2018  . Hyperlipidemia LDL goal <100 04/29/2017  . Migraine 04/26/2017  . 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 Left-sided weakness 06/14/2015  . COPD exacerbation ([place]) 06/09/2015  . Diastolic heart failure ([place]) 06/09/2015  . Shortness of breath 06/08/2015  . Chest pain at rest 06/08/2015  . Chest pain 06/08/2015  . Cocaine abuse ([place]) 05/19/2014   . Chest wall pain 05/17/2014  . Tobacco use disorder 05/17/2014  . CVA (cerebral infarction) 05/16/2014  . Asthma, chronic 05/16/2014  . Chest discomfort 05/16/2014  . Bug bite 05/16/2014  . Hypertension 04/28/2012    Past Surgical History:  Procedure Laterality Date  . NO PAST SURGERIES       OB History    Gravida  8   Para  5   Term      Preterm  5   AB  1   Living  4     SAB      IAB  1   Ectopic      Multiple      Live Births              Family History  Problem Relation Age of Onset  . Hypertension Mother   . Hypertension Father   . Cancer Father   . Hypertension Other   . Sickle cell trait Other   . Diabetes Other   . Diabetes Son   . Other Neg Hx     Social History   Tobacco Use  . Smoking status: Current Some Day Smoker    Packs/day: 0.25    Types: Cigarettes  . Smokeless tobacco: Never Used  Substance Use Topics  . Alcohol use: Not  Currently    Alcohol/week: 0.0 standard drinks    Comment: occ, previously drank (2) 40oz for 2-3 years, quit 2005  . Drug use: No    Comment: denies crack since 3 or4 months    Home Medications Prior to Admission medications   Medication Sig Start Date End Date Taking? Authorizing Provider  acetaminophen (TYLENOL) 325 MG tablet Take 2 tablets (650 mg total) by mouth every 6 (six) hours as needed for mild pain or moderate pain. 06/10/15   Buriev, Arie Sabina, MD  albuterol (VENTOLIN HFA) 108 (90 Base) MCG/ACT inhaler INHALE 2 PUFFS INTO THE LUNGS EVERY 6 (SIX) HOURS AS NEEDED FOR WHEEZING OR SHORTNESS OF BREATH. 09/09/20   Charlott Rakes, MD  amLODipine (NORVASC) 10 MG tablet Take 1 tablet (10 mg total) by mouth daily. 09/09/20   Charlott Rakes, MD  amoxicillin (AMOXIL) 500 MG capsule Take 2 capsules (1,000 mg total) by mouth 3 (three) times daily. Patient not taking: Reported on 07/08/2019 12/31/18   Charlott Rakes, MD  aspirin EC 81 MG tablet Take 1 tablet (81 mg total) by mouth daily. 12/31/18    Charlott Rakes, MD  atorvastatin (LIPITOR) 20 MG tablet Take 1 tablet (20 mg total) by mouth daily. 09/09/20   Charlott Rakes, MD  gabapentin (NEURONTIN) 300 MG capsule Take 2 capsules (600 mg total) by mouth 2 (two) times daily. Patient not taking: Reported on 01/02/2019 12/31/18   Charlott Rakes, MD  glucose blood (TRUE METRIX BLOOD GLUCOSE TEST) test strip Use as instructed 07/24/18   Argentina Donovan, PA-C  losartan (COZAAR) 25 MG tablet Take 1 tablet (25 mg total) by mouth daily. 07/08/19   Charlott Rakes, MD  meloxicam (MOBIC) 7.5 MG tablet Take 1 tablet (7.5 mg total) by mouth daily. 07/08/19   Charlott Rakes, MD  metFORMIN (GLUCOPHAGE) 500 MG tablet Take 1 tablet (500 mg total) by mouth 2 (two) times daily with a meal. 09/09/20   Charlott Rakes, MD  omeprazole (PRILOSEC) 40 MG capsule Take 1 capsule (40 mg total) by mouth daily. 04/10/18   Charlott Rakes, MD  traMADol (ULTRAM) 50 MG tablet Take 1 tablet (50 mg total) by mouth every 12 (twelve) hours as needed. 12/31/18   Charlott Rakes, MD  TRUEplus Lancets 28G MISC USE AS DIRECTED TWICE DAILY 03/10/20   Charlott Rakes, MD  Vitamin D, Ergocalciferol, (DRISDOL) 1.25 MG (50000 UT) CAPS capsule Take 1 capsule (50,000 Units total) by mouth every 7 (seven) days. Patient not taking: Reported on 07/08/2019 01/01/19   Charlott Rakes, MD    Allergies    Bee venom and Shellfish allergy  Review of Systems   Review of Systems  Constitutional: Negative for appetite change.  HENT: Negative for congestion.   Respiratory: Negative for shortness of breath.   Cardiovascular: Positive for chest pain.  Gastrointestinal: Negative for abdominal pain.  Genitourinary: Negative for flank pain.  Musculoskeletal: Positive for neck pain.  Skin: Negative for rash.  Neurological: Positive for numbness and headaches.  Psychiatric/Behavioral: Negative for confusion.    Physical Exam Updated Vital Signs BP (!) 157/106   Pulse 75   Temp 97.6 F (36.4 C)  (Oral)   Resp (!) 21   Ht 5\' 2"  (1.575 m)   Wt (!) 141.5 kg   SpO2 99%   BMI 57.07 kg/m   Physical Exam Vitals and nursing note reviewed.  HENT:     Head: Atraumatic.  Eyes:     Extraocular Movements: Extraocular movements intact.  Cardiovascular:  Rate and Rhythm: Normal rate and regular rhythm.     Heart sounds: No murmur heard.   Pulmonary:     Breath sounds: No wheezing or rhonchi.  Chest:     Chest wall: Tenderness present.     Comments: Some tenderness to left upper chest wall.  No crepitance. Abdominal:     Tenderness: There is no abdominal tenderness.  Musculoskeletal:        General: Normal range of motion.     Right lower leg: No edema.     Left lower leg: No edema.  Skin:    General: Skin is warm.     Capillary Refill: Capillary refill takes less than 2 seconds.  Neurological:     Mental Status: She is alert and oriented to person, place, and time.     Comments: Face symmetric.  Eye movements intact.  Normal speech.  Appears normal mental status.  Good grip strength bilaterally.  Somewhat variable exam with effort.  However this varies on both left and right side.  Describes decreased sensation over face and upper and lower extremities.  States she can feel me touching it does feel different.  States does not feel like the tingling of something fell asleep.  Psychiatric:        Mood and Affect: Mood is anxious.     ED Results / Procedures / Treatments   Labs (all labs ordered are listed, but only abnormal results are displayed) Labs Reviewed  APTT - Abnormal; Notable for the following components:      Result Value   aPTT 22 (*)    All other components within normal limits  DIFFERENTIAL - Abnormal; Notable for the following components:   Monocytes Absolute 1.1 (*)    All other components within normal limits  CBC  PROTIME-INR  BASIC METABOLIC PANEL  CBG MONITORING, ED  TROPONIN I (HIGH SENSITIVITY)    EKG EKG  Interpretation  Date/Time:  Wednesday February 01 2021 14:50:29 EDT Ventricular Rate:  69 PR Interval:  136 QRS Duration: 82 QT Interval:  424 QTC Calculation: 454 R Axis:   20 Text Interpretation: Normal sinus rhythm Possible Left atrial enlargement Septal infarct , age undetermined Abnormal ECG No significant change since last tracing Confirmed by Davonna Belling [phone]) on 02/01/2021 3:06:05 PM   Radiology No results found.  Procedures Procedures   Medications Ordered in ED Medications  sodium chloride flush (NS) 0.9 % injection 3 mL (has no administration in time range)  LORazepam (ATIVAN) injection 1 mg (has no administration in time range)    ED Course  I have reviewed the triage vital signs and the nursing notes.  Pertinent labs & imaging results that were available during my care of the patient were reviewed by me and considered in my medical decision making (see chart for details).    MDM Rules/Calculators/A&P    Patient was still headache neck pain chest pain and left-sided numbness.  Has had episodes in the past.  Patient states she has had strokes in the past but I see some reports a deficit did not actually see an MRI or CT proven stroke.  She has however had difficulty getting the MRI in the past.  Had similar episodes today.  Left-sided weakness.  Somewhat variable exam but does not appear to have any strength weakness.  The numbness does somewhat split across her body.  Has recently used cocaine and states she is still dealing with the death of her son is under a  lot of stress because of it.  With other recent visits had been thought that this had been potentially a grief reaction causing some of the symptoms.  I think this is likely similar but I still feel we need to do some work-up on this weakness.  Will get head CT initially.  We will also treat with some Ativan.  If symptoms improve significantly patient can likely be discharged home more expeditiously.  If can  some continued deficits may end up needing MRI which potentially would be just a limited so should be able to tolerate.  Also potentially if she cannot do that could get CT angiography or neurology consult.  Care will be turned over to Dr. Melina Copa      Final Clinical Impression(s) / ED Diagnoses Final diagnoses:  Paresthesia    Rx / DC Orders ED Discharge Orders    None       Davonna Belling, MD 02/01/21 1552

## 2021-02-01 NOTE — ED Notes (Signed)
Patient transported to X-ray 

## 2021-02-01 NOTE — ED Notes (Signed)
Pt refused discharge vitals and MRI

## 2021-02-01 NOTE — ED Notes (Signed)
Patient transported to MRI 

## 2021-02-02 ENCOUNTER — Other Ambulatory Visit: Payer: Self-pay

## 2021-02-02 ENCOUNTER — Ambulatory Visit: Payer: Self-pay | Admitting: Physician Assistant

## 2021-02-02 VITALS — BP 195/128 | HR 89 | Temp 98.2°F | Resp 18 | Ht 63.0 in | Wt 223.0 lb

## 2021-02-02 DIAGNOSIS — I10 Essential (primary) hypertension: Secondary | ICD-10-CM

## 2021-02-02 DIAGNOSIS — J438 Other emphysema: Secondary | ICD-10-CM

## 2021-02-02 DIAGNOSIS — E785 Hyperlipidemia, unspecified: Secondary | ICD-10-CM

## 2021-02-02 DIAGNOSIS — R7303 Prediabetes: Secondary | ICD-10-CM

## 2021-02-02 LAB — POCT GLYCOSYLATED HEMOGLOBIN (HGB A1C): Hemoglobin A1C: 5.3 % (ref 4.0–5.6)

## 2021-02-02 MED ORDER — AMLODIPINE BESYLATE 10 MG PO TABS
10.0000 mg | ORAL_TABLET | Freq: Every day | ORAL | 1 refills | Status: DC
Start: 2021-02-02 — End: 2021-08-22
  Filled 2021-02-02: qty 30, 30d supply, fill #0
  Filled 2021-07-13: qty 30, 30d supply, fill #1

## 2021-02-02 MED ORDER — ATORVASTATIN CALCIUM 20 MG PO TABS
20.0000 mg | ORAL_TABLET | Freq: Every day | ORAL | 1 refills | Status: DC
  Filled 2021-02-02: qty 30, 30d supply, fill #0

## 2021-02-02 MED ORDER — ALBUTEROL SULFATE HFA 108 (90 BASE) MCG/ACT IN AERS
INHALATION_SPRAY | RESPIRATORY_TRACT | 1 refills | Status: DC
  Filled 2021-02-02: qty 18, 25d supply, fill #0

## 2021-02-02 NOTE — Progress Notes (Signed)
Established Patient Office Visit  Subjective:  Patient ID: Taylor Vazquez, female    DOB: 05/08/1966  Age: 55 y.o. MRN: [id number]  CC:  Chief Complaint  Patient presents with  . Hypertension    HPI Taylor Vazquez reports that she was seen in the emergency department yesterday February 01, 2021 with complaint of sudden onset of headache and numbness, pain on the right side of her body and numbness on the left side.  Reports 2 previous strokes.  Hospital note   Patient was still headache neck pain chest pain and left-sided numbness.  Has had episodes in the past.  Patient states she has had strokes in the past but I see some reports a deficit did not actually see an MRI or CT proven stroke.  She has however had difficulty getting the MRI in the past.  Had similar episodes today.  Left-sided weakness.  Somewhat variable exam but does not appear to have any strength weakness.  The numbness does somewhat split across her body.  Has recently used cocaine and states she is still dealing with the death of her son is under a lot of stress because of it.  With other recent visits had been thought that this had been potentially a grief reaction causing some of the symptoms.  I think this is likely similar but I still feel we need to do some work-up on this weakness.  Will get head CT initially.  We will also treat with some Ativan.  If symptoms improve significantly patient can likely be discharged home more expeditiously.  If can some continued deficits may end up needing MRI which potentially would be just a limited so should be able to tolerate.  Also potentially if she cannot do that could get CT angiography or neurology consult.  Care will be turned over to Dr. Melina Copa Assessed patient.  She is complaining of numbness continue her left arm.  I offered her an MRI.  She said I would have to knock her oncologist to get the MRI.  It looks like she received Ativan for her last MRI so we will go with  that plan.  7 PM.  Patient returns from MRI and states that she wants to go home.  She said she will return if it gets any worse.   EXAM: CT HEAD WITHOUT CONTRAST  TECHNIQUE: Contiguous axial images were obtained from the base of the skull through the vertex without intravenous contrast.  COMPARISON:  12/11/2018  FINDINGS: Brain: No evidence of acute infarction, hemorrhage, hydrocephalus, extra-axial collection or mass lesion/mass effect.  Vascular: No hyperdense vessel or unexpected calcification.  Skull: Normal. Negative for fracture or focal lesion.  Sinuses/Orbits: No acute finding.  Other: None.  IMPRESSION: No acute intracranial abnormality noted.   Electronically Signed   By: Inez Catalina M.D.   On: 02/01/2021 16:32    NOTE: Patient did refuse MRI and discharge vitals    States today that she would like to restart her medications, states that she has been dealing with grief from her son's death but feels it is time for her to improve her own health.  Reports that she has lost approximately 80 pounds, has been working on eating healthier foods, walking more.  Reports her symptoms that she reported yesterday in the emergency department are not present today, did not want to stay for the MRI due to anxiety from that type of exam.  Reports that she does not check her blood pressure at  home, states that she is able to obtain a blood pressure machine.   Past Medical History:  Diagnosis Date  . Asthma   . CHF (congestive heart failure) ([place])   . Depression   . Hypertension   . Obesity   . Sickle cell trait ([place])   . Stroke [facility])     Past Surgical History:  Procedure Laterality Date  . NO PAST SURGERIES      Family History  Problem Relation Age of Onset  . Hypertension Mother   . Hypertension Father   . Cancer Father   . Hypertension Other   . Sickle cell trait Other   . Diabetes Other   . Diabetes Son   . Other Neg Hx     Social  History   Socioeconomic History  . Marital status: Single    Spouse name: Not on file  . Number of children: 4  . Years of education: Not on file  . Highest education level: 12th grade  Occupational History  . Occupation: PCA    Employer: [employer]  Tobacco Use  . Smoking status: Current Some Day Smoker    Packs/day: 0.25    Types: Cigarettes  . Smokeless tobacco: Never Used  Substance and Sexual Activity  . Alcohol use: Not Currently    Alcohol/week: 0.0 standard drinks    Comment: occ, previously drank (2) 40oz for 2-3 years, quit 2005  . Drug use: No    Comment: denies crack since 3 or4 months  . Sexual activity: Yes    Birth control/protection: None  Other Topics Concern  . Not on file  Social History Narrative   Patient is right-handed. She is single. She lives in a first floor apartment. She walks to work each day, using a can or walker.   She works as a Programmer, applications.   Social Determinants of Health   Financial Resource Strain: Not on file  Food Insecurity: Not on file  Transportation Needs: Not on file  Physical Activity: Not on file  Stress: Not on file  Social Connections: Not on file  Intimate Partner Violence: Not on file    Outpatient Medications Prior to Visit  Medication Sig Dispense Refill  . acetaminophen (TYLENOL) 325 MG tablet Take 2 tablets (650 mg total) by mouth every 6 (six) hours as needed for mild pain or moderate pain.    . TRUEplus Lancets 28G MISC USE AS DIRECTED TWICE DAILY 100 each 0  . albuterol (VENTOLIN HFA) 108 (90 Base) MCG/ACT inhaler INHALE 2 PUFFS INTO THE LUNGS EVERY 6 (SIX) HOURS AS NEEDED FOR WHEEZING OR SHORTNESS OF BREATH. (Patient taking differently: Inhale 2 puffs into the lungs every 6 (six) hours as needed for shortness of breath.) 18 g 1   Facility-Administered Medications Prior to Visit  Medication Dose Route Frequency Provider Last Rate Last Admin  . albuterol (PROVENTIL) (2.5 MG/3ML) 0.083% nebulizer  solution 2.5 mg  2.5 mg Nebulization Q4H PRN Charlott Rakes, MD        Allergies  Allergen Reactions  . Bee Venom Anaphylaxis  . Shellfish Allergy Anaphylaxis and Swelling    ROS Review of Systems  Constitutional: Negative for chills and fever.  HENT: Negative.   Eyes: Negative.   Respiratory: Negative for cough, shortness of breath and wheezing.   Cardiovascular: Negative for chest pain.  Gastrointestinal: Negative for nausea and vomiting.  Endocrine: Negative.   Genitourinary: Negative.   Musculoskeletal: Negative.   Skin: Negative.   Allergic/Immunologic: Negative.  Neurological: Negative for dizziness, weakness and headaches.  Hematological: Negative.   Psychiatric/Behavioral: Positive for dysphoric mood. Negative for self-injury and suicidal ideas. The patient is not nervous/anxious.       Objective:    Physical Exam Vitals and nursing note reviewed.  Constitutional:      General: She is not in acute distress.    Appearance: Normal appearance. She is obese. She is not ill-appearing.  HENT:     Head: Normocephalic and atraumatic.     Right Ear: External ear normal.     Left Ear: External ear normal.     Nose: Nose normal.     Mouth/Throat:     Mouth: Mucous membranes are moist.     Pharynx: Oropharynx is clear.  Eyes:     Extraocular Movements: Extraocular movements intact.     Conjunctiva/sclera: Conjunctivae normal.     Pupils: Pupils are equal, round, and reactive to light.  Cardiovascular:     Rate and Rhythm: Normal rate and regular rhythm.     Pulses: Normal pulses.     Heart sounds: Normal heart sounds.  Pulmonary:     Effort: Pulmonary effort is normal.     Breath sounds: Normal breath sounds.  Musculoskeletal:        General: Normal range of motion.     Cervical back: Normal range of motion and neck supple.  Skin:    General: Skin is warm and dry.  Neurological:     General: No focal deficit present.     Mental Status: She is alert and  oriented to person, place, and time.  Psychiatric:        Mood and Affect: Mood normal.        Behavior: Behavior normal.        Thought Content: Thought content normal.        Judgment: Judgment normal.     BP (!) 195/128 (BP Location: Left Arm, Patient Position: Sitting, Cuff Size: Normal)   Pulse 89   Temp 98.2 F (36.8 C) (Oral)   Resp 18   Ht 5\' 3"  (1.6 m)   Wt 223 lb (101.2 kg)   SpO2 100%   BMI 39.50 kg/m  Wt Readings from Last 3 Encounters:  02/02/21 223 lb (101.2 kg)  02/01/21 (!) 312 lb (141.5 kg)  01/02/19 227 lb (103 kg)     Health Maintenance Due  Topic Date Due  . Hepatitis C Screening  Never done  . COVID-19 Vaccine (1) Never done  . TETANUS/TDAP  Never done  . COLONOSCOPY (Pts 45-57yrs Insurance coverage will need to be confirmed)  Never done  . MAMMOGRAM  11/09/2017  . PAP SMEAR-Modifier  09/28/2018    There are no preventive care reminders to display for this patient.  Lab Results  Component Value Date   TSH 2.120 06/18/2018   Lab Results  Component Value Date   WBC 7.3 02/01/2021   HGB 14.8 02/01/2021   HCT 44.0 02/01/2021   MCV 91.9 02/01/2021   PLT 182 02/01/2021   Lab Results  Component Value Date   NA 142 02/01/2021   K 3.7 02/01/2021   CO2 26 02/01/2021   GLUCOSE 101 (H) 02/01/2021   BUN 17 02/01/2021   CREATININE 1.21 (H) 02/01/2021   BILITOT 0.4 12/11/2018   ALKPHOS 96 12/11/2018   AST 21 12/11/2018   ALT 22 12/11/2018   PROT 6.8 12/11/2018   ALBUMIN 3.4 (L) 12/11/2018   CALCIUM 8.8 (L) 02/01/2021  ANIONGAP 8 02/01/2021   Lab Results  Component Value Date   CHOL 182 04/26/2017   Lab Results  Component Value Date   HDL 46 04/26/2017   Lab Results  Component Value Date   LDLCALC 117 (H) 04/26/2017   Lab Results  Component Value Date   TRIG 95 04/26/2017   Lab Results  Component Value Date   CHOLHDL 4.0 04/26/2017   Lab Results  Component Value Date   HGBA1C 5.3 02/02/2021      Assessment & Plan:    Problem List Items Addressed This Visit      Cardiovascular and Mediastinum   Hypertension   Relevant Medications   amLODipine (NORVASC) 10 MG tablet   atorvastatin (LIPITOR) 20 MG tablet   Other Relevant Orders   Comp. Metabolic Panel (12)   TSH     Other   Hyperlipidemia LDL goal <100   Relevant Medications   amLODipine (NORVASC) 10 MG tablet   atorvastatin (LIPITOR) 20 MG tablet   Other Relevant Orders   Lipid panel   Prediabetes - Primary   Relevant Orders   Vitamin D, 25-hydroxy   HgB A1c (Completed)    Other Visit Diagnoses    Other emphysema ([place])       Relevant Medications   albuterol (VENTOLIN HFA) 108 (90 Base) MCG/ACT inhaler      Meds ordered this encounter  Medications  . amLODipine (NORVASC) 10 MG tablet    Sig: Take 1 tablet (10 mg total) by mouth daily.    Dispense:  30 tablet    Refill:  1    Order Specific Question:   Supervising Provider    Answer:   Joya Gaskins, PATRICK E [[id number]]  . atorvastatin (LIPITOR) 20 MG tablet    Sig: Take 1 tablet (20 mg total) by mouth daily.    Dispense:  30 tablet    Refill:  1    Order Specific Question:   Supervising Provider    Answer:   Joya Gaskins, PATRICK E [[id number]]  . albuterol (VENTOLIN HFA) 108 (90 Base) MCG/ACT inhaler    Sig: INHALE 2 PUFFS INTO THE LUNGS EVERY 6 (SIX) HOURS AS NEEDED FOR WHEEZING OR SHORTNESS OF BREATH.    Dispense:  18 g    Refill:  1    Order Specific Question:   Supervising Provider    Answer:   WRIGHT, PATRICK E [[id number]]  1. Primary hypertension Resume amlodipine, patient stated that Dr. Margarita Rana had stopped her losartan, I did not see any documentation regarding this on her chart, patient is adamant about not taking losartan as well.  Patient declined clonidine in office due to adverse effects.  Patient encouraged to check blood pressure at home on a daily basis, keep a written log and have available for all office visits.  Patient has upcoming appointment with Dr. Margarita Rana at the end of May 2022,  encouraged patient to return to mobile unit in 2 to 3 weeks for follow-up blood pressure review.  [facility] fasting labs in the next week.  Patient instructed to call and make an appointment for such.  Red flags given for prompt reevaluation.   - amLODipine (NORVASC) 10 MG tablet; Take 1 tablet (10 mg total) by mouth daily.  Dispense: 30 tablet; Refill: 1 - Comp. Metabolic Panel (12); Future - TSH; Future  2. Prediabetes A1c 5.3, patient has had 80 pound weight loss, has not taken Metformin in at least the last  90 days.  Patient encouraged to hold metformin at this time, continue lifestyle modifications. - Vitamin D, 25-hydroxy; Future - HgB A1c  3. Hyperlipidemia LDL goal <100 Resume - atorvastatin (LIPITOR) 20 MG tablet; Take 1 tablet (20 mg total) by mouth daily.  Dispense: 30 tablet; Refill: 1 - Lipid panel; Future  4. Other emphysema ([facility]) Resume - albuterol (VENTOLIN HFA) 108 (90 Base) MCG/ACT inhaler; INHALE 2 PUFFS INTO THE LUNGS EVERY 6 (SIX) HOURS AS NEEDED FOR WHEEZING OR SHORTNESS OF BREATH.  Dispense: 18 g; Refill: 1    I have reviewed the patient's medical history (PMH, PSH, Social History, Family History, Medications, and allergies) , and have been updated if relevant. I spent 30 minutes reviewing chart and  face to face time with patient.      Follow-up: Return in about 7 weeks (around 03/21/2021) for [facility].    Loraine Grip Mayers, PA-C

## 2021-02-02 NOTE — Progress Notes (Signed)
Patient has not taken medication but patient has had cake.

## 2021-02-02 NOTE — Patient Instructions (Signed)
I sent refills of your blood pressure medication and cholesterol medication as well as the inhaler to [facility].  Please make sure that you call [facility] and make an appointment to go in for fasting labs to be completed.  Please have this completed next week.  Once those are completed, we will call you with those results.  Please return to the mobile unit in 3 to 4 weeks for follow-up on your blood pressure.  Kennieth Rad, PA-C Physician Assistant [place] [email or web address]   How to Take Your Blood Pressure Blood pressure is a measurement of how strongly your blood is pressing against the walls of your arteries. Arteries are blood vessels that carry blood from your heart throughout your body. Your health care provider takes your blood pressure at each office visit. You can also take your own blood pressure at home with a blood pressure monitor. You may need to take your own blood pressure to:  Confirm a diagnosis of high blood pressure (hypertension).  Monitor your blood pressure over time.  Make sure your blood pressure medicine is working. Supplies needed:  Blood pressure monitor.  Dining room chair to sit in.  Table or desk.  Small notebook and pencil or pen. How to prepare To get the most accurate reading, avoid the following for 30 minutes before you check your blood pressure:  Drinking caffeine.  Drinking alcohol.  Eating.  Smoking.  Exercising. Five minutes before you check your blood pressure:  Use the bathroom and urinate so that you have an empty bladder.  Sit quietly in a dining room chair. Do not sit in a soft couch or an armchair. Do not talk. How to take your blood pressure To check your blood pressure, follow the instructions in the manual that came with your blood pressure monitor. If you have a digital blood pressure monitor,  the instructions may be as follows: 1. Sit up straight in a chair. 2. Place your feet on the floor. Do not cross your ankles or legs. 3. Rest your left arm at the level of your heart on a table or desk or on the arm of a chair. 4. Pull up your shirt sleeve. 5. Wrap the blood pressure cuff around the upper part of your left arm, 1 inch (2.5 cm) above your elbow. It is best to wrap the cuff around bare skin. 6. Fit the cuff snugly around your arm. You should be able to place only one finger between the cuff and your arm. 7. Position the cord so that it rests in the bend of your elbow. 8. Press the power button. 9. Sit quietly while the cuff inflates and deflates. 10. Read the digital reading on the monitor screen and write the numbers down (record them) in a notebook. 11. Wait 2-3 minutes, then repeat the steps, starting at step 1.   What does my blood pressure reading mean? A blood pressure reading consists of a higher number over a lower number. Ideally, your blood pressure should be below 120/80. The first ("top") number is called the systolic pressure. It is a measure of the pressure in your arteries as your heart beats. The second ("bottom") number is called the diastolic pressure. It is a measure of the pressure in your arteries as the heart relaxes. Blood pressure is classified into five stages. The following are the stages for adults who do not have a short-term serious illness or a chronic  condition. Systolic pressure and diastolic pressure are measured in a unit called mm Hg (millimeters of mercury).  Normal  Systolic pressure: below 160.  Diastolic pressure: below 80. Elevated  Systolic pressure: 737-106.  Diastolic pressure: below 80. Hypertension stage 1  Systolic pressure: 269-485.  Diastolic pressure: 46-27. Hypertension stage 2  Systolic pressure: 035 or above.  Diastolic pressure: 90 or above. You can have elevated blood pressure or hypertension even if only the  systolic or only the diastolic number in your reading is higher than normal. Follow these instructions at home:  Check your blood pressure as often as recommended by your health care provider.  Check your blood pressure at the same time every day.  Take your monitor to the next appointment with your health care provider to make sure that: ? You are using it correctly. ? It provides accurate readings.  Be sure you understand what your goal blood pressure numbers are.  Tell your health care provider if you are having any side effects from blood pressure medicine.  Keep all follow-up visits as told by your health care provider. This is important. General tips  Your health care provider can suggest a reliable monitor that will meet your needs. There are several types of home blood pressure monitors.  Choose a monitor that has an arm cuff. Do not choose a monitor that measures your blood pressure from your wrist or finger.  Choose a cuff that wraps snugly around your upper arm. You should be able to fit only one finger between your arm and the cuff.  You can buy a blood pressure monitor at most drugstores or online. Where to find more information American Heart Association: www.heart.org Contact a health care provider if:  Your blood pressure is consistently high. Get help right away if:  Your systolic blood pressure is higher than 180.  Your diastolic blood pressure is higher than 120. Summary  Blood pressure is a measurement of how strongly your blood is pressing against the walls of your arteries.  A blood pressure reading consists of a higher number over a lower number. Ideally, your blood pressure should be below 120/80.  Check your blood pressure at the same time every day.  Avoid caffeine, alcohol, smoking, and exercise for 30 minutes prior to checking your blood pressure. These agents can affect the accuracy of the blood pressure reading. This information is not  intended to replace advice given to you by your health care provider. Make sure you discuss any questions you have with your health care provider. Document Revised: 10/02/2019 Document Reviewed: 10/02/2019 Elsevier Patient Education  2021 [employer].

## 2021-02-03 DIAGNOSIS — J438 Other emphysema: Secondary | ICD-10-CM | POA: Insufficient documentation

## 2021-02-06 ENCOUNTER — Other Ambulatory Visit: Payer: Self-pay

## 2021-02-07 ENCOUNTER — Emergency Department (HOSPITAL_COMMUNITY)
Admission: EM | Admit: 2021-02-07 | Discharge: 2021-02-07 | Disposition: A | Discharge status: Home / Self Care | Payer: Self-pay | Attending: Emergency Medicine | Admitting: Emergency Medicine

## 2021-02-07 ENCOUNTER — Ambulatory Visit: Payer: Self-pay | Admitting: *Deleted

## 2021-02-07 DIAGNOSIS — F17210 Nicotine dependence, cigarettes, uncomplicated: Secondary | ICD-10-CM | POA: Insufficient documentation

## 2021-02-07 DIAGNOSIS — J45909 Unspecified asthma, uncomplicated: Secondary | ICD-10-CM | POA: Insufficient documentation

## 2021-02-07 DIAGNOSIS — R202 Paresthesia of skin: Secondary | ICD-10-CM

## 2021-02-07 DIAGNOSIS — I5032 Chronic diastolic (congestive) heart failure: Secondary | ICD-10-CM | POA: Insufficient documentation

## 2021-02-07 DIAGNOSIS — Z79899 Other long term (current) drug therapy: Secondary | ICD-10-CM | POA: Insufficient documentation

## 2021-02-07 DIAGNOSIS — J441 Chronic obstructive pulmonary disease with (acute) exacerbation: Secondary | ICD-10-CM | POA: Insufficient documentation

## 2021-02-07 DIAGNOSIS — Z8673 Personal history of transient ischemic attack (TIA), and cerebral infarction without residual deficits: Secondary | ICD-10-CM | POA: Insufficient documentation

## 2021-02-07 DIAGNOSIS — I110 Hypertensive heart disease with heart failure: Secondary | ICD-10-CM | POA: Insufficient documentation

## 2021-02-07 NOTE — Telephone Encounter (Signed)
C/o left hand numbness and now extended to left elbow . Numbness now in left side of face and jaw since last night. Denies weakness in left leg, no chest pain, difficulty breathing or palpitations. Patient reports she has been seen in ED for left hand numbness and was since to [place]. Patient unable to take B/P but reports she has taken B/P meds today .  No available appt with PCP and instructed patient due to worsening symptoms to go to ED now. Care advise given. Patient verbalized understanding of care advise and to go to ED now or call 911 due to constant symptoms.  Reason for Disposition . [1] Weakness of the face, arm / hand, or leg / foot on one side of the body AND [2] gradual onset (e.g., days to weeks) AND [3] present now  Answer Assessment - Initial Assessment Questions 1. SYMPTOM: "What is the main symptom you are concerned about?" (e.g., weakness, numbness)     Left hand up to elbow is numb and now left side of face is feeling numb 2. ONSET: "When did this start?" (minutes, hours, days; while sleeping)     Facial numbness started last night  3. LAST NORMAL: "When was the last time you were normal (no symptoms)?"     Last night 4. PATTERN "Does this come and go, or has it been constant since it started?"  "Is it present now?"     Now constant 5. CARDIAC SYMPTOMS: "Have you had any of the following symptoms: chest pain, difficulty breathing, palpitations?"     no 6. NEUROLOGIC SYMPTOMS: "Have you had any of the following symptoms: headache, dizziness, vision loss, double vision, changes in speech, unsteady on your feet?"     Blurred vision in left eye 7. OTHER SYMPTOMS: "Do you have any other symptoms?"     denies 8. PREGNANCY: "Is there any chance you are pregnant?" "When was your last menstrual period?"     na  Protocols used: NEUROLOGIC DEFICIT-A-AH

## 2021-02-07 NOTE — ED Provider Notes (Signed)
[facility] EMERGENCY DEPARTMENT Provider Note   CSN: [id number] Arrival date & time: 02/07/21  1324     History Chief Complaint  Patient presents with  . recheck paraesthesia    Taylor Vazquez is a 55 y.o. female.  HPI      Taylor Vazquez is a 55 y.o. female, with a history of CHF, HTN, stroke, asthma, presenting to the ED with persistent left upper and lower extremity paresthesia for the past several days. She was seen in the ED on April 13 for similar symptoms. She states they were going to perform MRI, however, she "needs to be asleep for MRI," the Ativan they gave her did not put her to sleep, therefore she left the ED. When she called her PCP office today and spoke with the nurse, they told her to come back to the emergency department.  Patient denies additional symptoms.  Denies facial droop, fever, weakness, vision loss, neck/back pain, difficulty speaking, difficulty swallowing, chest pain, shortness of breath, or any other complaints.     Past Medical History:  Diagnosis Date  . Asthma   . CHF (congestive heart failure) ([place])   . Depression   . Hypertension   . Obesity   . Sickle cell trait ([place])   . Stroke [facility])     Patient Active Problem List   Diagnosis Date Noted  . Other emphysema ([place]) 02/03/2021  . Ulnar neuropathy at elbow of left upper extremity 01/02/2019  . Prediabetes 01/14/2018  . Hyperlipidemia LDL goal <100 04/29/2017  . Migraine 04/26/2017  . 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 Left-sided weakness 06/14/2015  . COPD exacerbation ([place]) 06/09/2015  . Diastolic heart failure ([place]) 06/09/2015  . Shortness of breath 06/08/2015  . Chest pain at rest 06/08/2015  . Chest pain 06/08/2015  . Cocaine abuse ([place]) 05/19/2014  . Chest wall pain 05/17/2014  . Tobacco use disorder 05/17/2014  . CVA (cerebral infarction) 05/16/2014  . Asthma, chronic 05/16/2014  . Chest discomfort 05/16/2014  . Bug bite  05/16/2014  . Hypertension 04/28/2012    Past Surgical History:  Procedure Laterality Date  . NO PAST SURGERIES       OB History    Gravida  8   Para  5   Term      Preterm  5   AB  1   Living  4     SAB      IAB  1   Ectopic      Multiple      Live Births              Family History  Problem Relation Age of Onset  . Hypertension Mother   . Hypertension Father   . Cancer Father   . Hypertension Other   . Sickle cell trait Other   . Diabetes Other   . Diabetes Son   . Other Neg Hx     Social History   Tobacco Use  . Smoking status: Current Some Day Smoker    Packs/day: 0.25    Types: Cigarettes  . Smokeless tobacco: Never Used  Substance Use Topics  . Alcohol use: Not Currently    Alcohol/week: 0.0 standard drinks    Comment: occ, previously drank (2) 40oz for 2-3 years, quit 2005  . Drug use: No    Comment: denies crack since 3 or4 months    Home Medications Prior to Admission medications   Medication Sig Start Date  End Date Taking? Authorizing Provider  acetaminophen (TYLENOL) 325 MG tablet Take 2 tablets (650 mg total) by mouth every 6 (six) hours as needed for mild pain or moderate pain. 06/10/15   Kinnie Feil, MD  albuterol (VENTOLIN HFA) 108 (90 Base) MCG/ACT inhaler INHALE 2 PUFFS INTO THE LUNGS EVERY 6 (SIX) HOURS AS NEEDED FOR WHEEZING OR SHORTNESS OF BREATH. 02/02/21   Mayers, Cari S, PA-C  amLODipine (NORVASC) 10 MG tablet Take 1 tablet (10 mg total) by mouth daily. 02/02/21   Mayers, Cari S, PA-C  atorvastatin (LIPITOR) 20 MG tablet Take 1 tablet (20 mg total) by mouth daily. 02/02/21   Mayers, Cari S, PA-C  TRUEplus Lancets 28G MISC USE AS DIRECTED TWICE DAILY 03/10/20   Charlott Rakes, MD    Allergies    Bee venom and Shellfish allergy  Review of Systems   Review of Systems  Constitutional: Negative for chills, diaphoresis and fever.  Eyes: Negative for visual disturbance.  Respiratory: Negative for shortness of breath.    Cardiovascular: Negative for chest pain.  Gastrointestinal: Negative for abdominal pain, diarrhea, nausea and vomiting.  Musculoskeletal: Negative for back pain and neck pain.  Neurological: Negative for dizziness, syncope, facial asymmetry, speech difficulty, weakness, light-headedness and numbness.       Left upper and lower extremity paresthesias.  All other systems reviewed and are negative.   Physical Exam Updated Vital Signs BP (!) 151/99   Pulse 68   Temp 97.7 F (36.5 C) (Oral)   Resp 17   SpO2 96%   Physical Exam Vitals and nursing note reviewed.  Constitutional:      General: She is not in acute distress.    Appearance: She is well-developed. She is not diaphoretic.  HENT:     Head: Normocephalic and atraumatic.     Mouth/Throat:     Mouth: Mucous membranes are moist.     Pharynx: Oropharynx is clear.  Eyes:     Conjunctiva/sclera: Conjunctivae normal.  Cardiovascular:     Rate and Rhythm: Normal rate and regular rhythm.     Pulses: Normal pulses.          Radial pulses are 2+ on the right side and 2+ on the left side.       Posterior tibial pulses are 2+ on the right side and 2+ on the left side.     Heart sounds: Normal heart sounds.     Comments: Tactile temperature in the extremities appropriate and equal bilaterally. Pulmonary:     Effort: Pulmonary effort is normal. No respiratory distress.     Breath sounds: Normal breath sounds.  Abdominal:     Palpations: Abdomen is soft.     Tenderness: There is no abdominal tenderness. There is no guarding.  Musculoskeletal:     Cervical back: Normal range of motion and neck supple.     Right lower leg: No edema.     Left lower leg: No edema.  Skin:    General: Skin is warm and dry.  Neurological:     Mental Status: She is alert.     Comments: No noted acute cognitive deficit. She subjectively endorses decreased sensation in the left hand, but full sensation in the upper arm as well as the left leg. Patient  tries to demonstrate weakness in her left grip strength, however, upon observing her behavior she uses the left hand to grip the side rail when asked to sit up in the bed.  This arm and hand  supported her entire upper body weight to allow her to sit up in the bed. Patient demonstrates equal use in her bilateral upper extremities. No gait disturbance.  Coordination intact.  Cranial nerves III-XII grossly intact.  Handles oral secretions without noted difficulty.  No noted phonation or speech deficit. No facial droop.   Psychiatric:        Mood and Affect: Mood and affect normal.        Speech: Speech normal.        Behavior: Behavior normal.     ED Results / Procedures / Treatments   Labs (all labs ordered are listed, but only abnormal results are displayed) Labs Reviewed - No data to display  EKG None  Radiology No results found.  Procedures Procedures   Medications Ordered in ED Medications - No data to display  ED Course  I have reviewed the triage vital signs and the nursing notes.  Pertinent labs & imaging results that were available during my care of the patient were reviewed by me and considered in my medical decision making (see chart for details).    MDM Rules/Calculators/A&P                          Patient presents with continued paresthesias beginning several days ago. Her exam was not consistent across multiple testing situations. I offered the patient an MRI here in the ED.  She stated she would need to be "completely asleep" during this exam.  Due to patient volume and current staffing, we are unable to facilitate this type of sedation. Patient states she would rather have any further imaging performed as an outpatient and would actually prefer to have any further evaluation done outpatient.  Findings and plan of care discussed with attending physician, Dorie Rank, MD.    Final Clinical Impression(s) / ED Diagnoses Final diagnoses:  Paresthesias    Rx  / DC Orders ED Discharge Orders    None       Layla Maw 02/08/21 0102    Dorie Rank, MD 02/09/21 [phone]

## 2021-02-07 NOTE — ED Triage Notes (Signed)
Emergency Medicine Provider Triage Evaluation Note  Taylor Vazquez , a 55 y.o. female  was evaluated in triage.  Pt complains of paraesthesias on left side of arm and face for 6 days, was seen for this here then. Did not get MRI. Pt states that 2-3 days ago she has been weak on left side as well. States she has had a prior stroke. No pain or trauma to L arm.  Hx of pinched nerve in neck.   Review of Systems  Positive: L arm weakness and paraesthesias  Negative: Aphasia, facial droop Physical Exam  There were no vitals taken for this visit. Gen:   Awake, no distress   HEENT:  Atraumatic  Resp:  Normal effort  Cardiac:  Normal rate  Abd:   Nondistended, nontender  MSK:   Moves extremities without difficulty  Neuro:  No facial droop, normal speech. Pt with subjective numbess to L face and L extremity. 4/5 strength to L upper compared to R upper. Questionable positive pronator drift on L side  Medical Decision Making  Medically screening exam initiated at 1:33 PM.  Appropriate orders placed.  Taylor Vazquez was informed that the remainder of the evaluation will be completed by another provider, this initial triage assessment does not replace that evaluation, and the importance of remaining in the ED until their evaluation is complete.  Clinical Impression  Pt with stroke like symptoms, out of window. Had CT head done 6 days ago, did not have MRI. Pt is stable. Neuro exam from Dr. Alvino Chapel note seems similar to today.  MSE was initiated and I personally evaluated the patient and placed orders (if any) at  1:36 PM on February 07, 2021.  The patient appears stable so that the remainder of the MSE may be completed by another provider.    Alfredia Client, PA-C 02/07/21 1338

## 2021-02-07 NOTE — Discharge Instructions (Signed)
Follow-up with your primary care provider and neurology on this matter.  They will need to set up any further imaging.

## 2021-02-07 NOTE — ED Triage Notes (Signed)
Pt here back to the the ED for numbness to the left arm and face , pt was seen for the same  6 days ago ,

## 2021-03-21 ENCOUNTER — Inpatient Hospital Stay: Payer: Self-pay | Admitting: Family Medicine

## 2021-04-27 ENCOUNTER — Other Ambulatory Visit: Payer: Self-pay

## 2021-07-13 ENCOUNTER — Other Ambulatory Visit: Payer: Self-pay

## 2021-07-19 ENCOUNTER — Other Ambulatory Visit: Payer: Self-pay

## 2021-08-15 ENCOUNTER — Other Ambulatory Visit: Payer: Self-pay

## 2021-08-15 ENCOUNTER — Emergency Department (HOSPITAL_COMMUNITY): Payer: Self-pay

## 2021-08-15 ENCOUNTER — Emergency Department (HOSPITAL_COMMUNITY)
Admission: EM | Admit: 2021-08-15 | Discharge: 2021-08-15 | Disposition: A | Discharge status: Home / Self Care | Payer: Self-pay | Attending: Emergency Medicine | Admitting: Emergency Medicine

## 2021-08-15 DIAGNOSIS — J45909 Unspecified asthma, uncomplicated: Secondary | ICD-10-CM | POA: Insufficient documentation

## 2021-08-15 DIAGNOSIS — R109 Unspecified abdominal pain: Secondary | ICD-10-CM

## 2021-08-15 DIAGNOSIS — J449 Chronic obstructive pulmonary disease, unspecified: Secondary | ICD-10-CM | POA: Insufficient documentation

## 2021-08-15 DIAGNOSIS — F17210 Nicotine dependence, cigarettes, uncomplicated: Secondary | ICD-10-CM | POA: Insufficient documentation

## 2021-08-15 DIAGNOSIS — Z79899 Other long term (current) drug therapy: Secondary | ICD-10-CM | POA: Insufficient documentation

## 2021-08-15 DIAGNOSIS — I509 Heart failure, unspecified: Secondary | ICD-10-CM | POA: Insufficient documentation

## 2021-08-15 DIAGNOSIS — R059 Cough, unspecified: Secondary | ICD-10-CM | POA: Insufficient documentation

## 2021-08-15 DIAGNOSIS — R1011 Right upper quadrant pain: Secondary | ICD-10-CM | POA: Insufficient documentation

## 2021-08-15 DIAGNOSIS — I110 Hypertensive heart disease with heart failure: Secondary | ICD-10-CM | POA: Insufficient documentation

## 2021-08-15 LAB — CBC WITH DIFFERENTIAL/PLATELET
Abs Immature Granulocytes: 0.02 10*3/uL (ref 0.00–0.07)
Basophils Absolute: 0 10*3/uL (ref 0.0–0.1)
Basophils Relative: 0 %
Eosinophils Absolute: 0.2 10*3/uL (ref 0.0–0.5)
Eosinophils Relative: 2 %
HCT: 45 % (ref 36.0–46.0)
Hemoglobin: 15 g/dL (ref 12.0–15.0)
Immature Granulocytes: 0 %
Lymphocytes Relative: 44 %
Lymphs Abs: 3.2 10*3/uL (ref 0.7–4.0)
MCH: 29.4 pg (ref 26.0–34.0)
MCHC: 33.3 g/dL (ref 30.0–36.0)
MCV: 88.2 fL (ref 80.0–100.0)
Monocytes Absolute: 1 10*3/uL (ref 0.1–1.0)
Monocytes Relative: 13 %
Neutro Abs: 3 10*3/uL (ref 1.7–7.7)
Neutrophils Relative %: 41 %
Platelets: 220 10*3/uL (ref 150–400)
RBC: 5.1 MIL/uL (ref 3.87–5.11)
RDW: 14.5 % (ref 11.5–15.5)
WBC: 7.5 10*3/uL (ref 4.0–10.5)
nRBC: 0 % (ref 0.0–0.2)

## 2021-08-15 LAB — COMPREHENSIVE METABOLIC PANEL
ALT: 17 U/L (ref 0–44)
AST: 17 U/L (ref 15–41)
Albumin: 3.1 g/dL — ABNORMAL LOW (ref 3.5–5.0)
Alkaline Phosphatase: 103 U/L (ref 38–126)
Anion gap: 8 (ref 5–15)
BUN: 12 mg/dL (ref 6–20)
CO2: 24 mmol/L (ref 22–32)
Calcium: 8.8 mg/dL — ABNORMAL LOW (ref 8.9–10.3)
Chloride: 106 mmol/L (ref 98–111)
Creatinine, Ser: 1.17 mg/dL — ABNORMAL HIGH (ref 0.44–1.00)
GFR, Estimated: 56 mL/min — ABNORMAL LOW (ref >60)
Glucose, Bld: 109 mg/dL — ABNORMAL HIGH (ref 70–99)
Potassium: 3.6 mmol/L (ref 3.5–5.1)
Sodium: 138 mmol/L (ref 135–145)
Total Bilirubin: 0.5 mg/dL (ref 0.3–1.2)
Total Protein: 6.5 g/dL (ref 6.5–8.1)

## 2021-08-15 LAB — ACETAMINOPHEN LEVEL: Acetaminophen (Tylenol), Serum: <10 ug/mL — ABNORMAL LOW (ref 10–30)

## 2021-08-15 LAB — LIPASE, BLOOD: Lipase: 33 U/L (ref 11–51)

## 2021-08-15 MED ORDER — HYDROMORPHONE HCL 1 MG/ML IJ SOLN
1.0000 mg | Freq: Once | INTRAMUSCULAR | Status: AC
  Administered 2021-08-15: 1 mg via INTRAVENOUS
  Filled 2021-08-15: qty 1

## 2021-08-15 MED ORDER — ONDANSETRON 4 MG PO TBDP: 4.0000 mg | ORAL_TABLET | Freq: Three times a day (TID) | ORAL | 0 refills | Status: DC | PRN

## 2021-08-15 MED ORDER — ONDANSETRON HCL 4 MG/2ML IJ SOLN
4.0000 mg | Freq: Once | INTRAMUSCULAR | Status: AC
  Administered 2021-08-15: 4 mg via INTRAVENOUS
  Filled 2021-08-15: qty 2

## 2021-08-15 MED ORDER — PANTOPRAZOLE SODIUM 20 MG PO TBEC
20.0000 mg | DELAYED_RELEASE_TABLET | Freq: Every day | ORAL | 0 refills | Status: DC
  Filled 2021-08-15: qty 30, 30d supply, fill #0

## 2021-08-15 MED ORDER — SODIUM CHLORIDE 0.9 % IV BOLUS
500.0000 mL | Freq: Once | INTRAVENOUS | Status: AC
  Administered 2021-08-15: 500 mL via INTRAVENOUS

## 2021-08-15 MED ORDER — TRAMADOL HCL 50 MG PO TABS: 50.0000 mg | ORAL_TABLET | Freq: Four times a day (QID) | ORAL | 0 refills | Status: DC | PRN

## 2021-08-15 MED ORDER — ONDANSETRON 4 MG PO TBDP
4.0000 mg | ORAL_TABLET | Freq: Three times a day (TID) | ORAL | 0 refills | Status: DC | PRN
  Filled 2021-08-15: qty 20, 7d supply, fill #0

## 2021-08-15 NOTE — ED Provider Notes (Signed)
Emergency Medicine Provider Triage Evaluation Note  Taylor Vazquez , a 55 y.o. female  was evaluated in triage.  Pt complains of RUQ abdominal pain radiating to back x6 hours.  No meds taken PTA.  States she feels like her liver is enlarged.  Denies heavy EtOH but does report taking a large amount of tylenol last week due to toothache.  Review of Systems  Positive: Abdominal pain Negative: Fever, chills  Physical Exam  BP (!) 154/94   Pulse 77   Temp 97.7 F (36.5 C) (Oral)   Resp (!) 22   SpO2 97%   Gen:   Awake, no distress   Resp:  Normal effort  MSK:   Moves extremities without difficulty  Other:  TTP in RUQ  Medical Decision Making  Medically screening exam initiated at 6:11 AM.  Appropriate orders placed.  Taylor Vazquez was informed that the remainder of the evaluation will be completed by another provider, this initial triage assessment does not replace that evaluation, and the importance of remaining in the ED until their evaluation is complete.  RUQ pain radiating to back x6 hours.  Admits to taking too much APAP last week due to dental pain.  Labs, APAP level, RUQ [place].   Taylor Pickett, PA-C 08/15/21 Blain, Hans[place], DO 08/15/21 [phone]

## 2021-08-15 NOTE — Discharge Instructions (Addendum)
Take Protonix 20 mg daily for the next 4 weeks.  Take it 30 minutes before your first meal of the day.  This medicine helps reduce the amount of acid in your stomach.  Your work-up today was reassuring, follow-up with your primary care doctor as needed for additional evaluation.  Return if things change or worsen.

## 2021-08-15 NOTE — ED Triage Notes (Signed)
Pt by [facility] for RUQ abd pain that radiates to back that began at approx midnight. Reproducible w palpation.   172/118, hx same HR 72

## 2021-08-15 NOTE — ED Triage Notes (Signed)
On further exam, pt states, "I think something's wrong w my liver." Advises that she "took too much tylenol to help w dental pain last week."

## 2021-08-15 NOTE — ED Notes (Signed)
Pt stated she is diabetic and needs going to eat I informed pt I will check her CBG

## 2021-08-15 NOTE — ED Provider Notes (Signed)
[facility] EMERGENCY DEPARTMENT Provider Note   CSN: [id number] Arrival date & time: 08/15/21  7026     History Chief Complaint  Patient presents with   Abdominal Pain    Taylor Vazquez is a 55 y.o. female.   Abdominal Pain Associated symptoms: cough   Associated symptoms: no chest pain, no chills, no diarrhea, no dysuria, no fever, no hematuria, no nausea, no shortness of breath, no sore throat and no vomiting    Patient presents with right upper quadrant and right flank pain.  Started acutely yesterday, the pain is constant.  She has never felt pain like this before, moving is an exacerbating factor.  Coughing also aggravates the pain.  She has tried over-the-counter Tylenol without any relief.  Reports she took a lot of Tylenol 1 week ago due to a tooth ache, concerned that she feels she can palpate her liver.  She denies any nausea, CP, vomiting, change in bowel habits.  Does report right upper quadrant pain that radiates to the back as well as the right flank.  She is not having any dysuria or hematuria.  No history of prior abdominal surgeries, no history of prior kidney stones.  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 06/14/2015   Left-sided weakness 06/14/2015   COPD exacerbation ([place]) 37/85/8850   Diastolic heart failure ([place]) 06/09/2015   Shortness of breath 06/08/2015   Chest pain at rest 06/08/2015   Chest pain 06/08/2015   Cocaine abuse ([place]) 05/19/2014   Chest wall pain 05/17/2014   Tobacco use disorder 05/17/2014   CVA  (cerebral infarction) 05/16/2014   Asthma, chronic 05/16/2014   Chest discomfort 05/16/2014   Bug bite 05/16/2014   Hypertension 04/28/2012    Past Surgical History:  Procedure Laterality Date   NO PAST SURGERIES       OB History     Gravida  81   Para  5   Term      Preterm  5   AB  1   Living  4      SAB      IAB  1   Ectopic      Multiple      Live Births              Family History  Problem Relation Age of Onset   Hypertension Mother    Hypertension Father    Cancer Father    Hypertension Other    Sickle cell trait Other    Diabetes Other    Diabetes Son    Other Neg Hx     Social History   Tobacco Use   Smoking status: Some Days    Packs/day: 0.25    Types: Cigarettes   Smokeless tobacco: Never  Substance Use Topics   Alcohol use: Not Currently    Alcohol/week: 0.0 standard drinks    Comment: occ, previously drank (2) 40oz for 2-3 years, quit 2005   Drug use: No  Comment: denies crack since 3 or4 months    Home Medications Prior to Admission medications   Medication Sig Start Date End Date Taking? Authorizing Provider  acetaminophen (TYLENOL) 325 MG tablet Take 2 tablets (650 mg total) by mouth every 6 (six) hours as needed for mild pain or moderate pain. 06/10/15   Kinnie Feil, MD  albuterol (VENTOLIN HFA) 108 (90 Base) MCG/ACT inhaler INHALE 2 PUFFS INTO THE LUNGS EVERY 6 (SIX) HOURS AS NEEDED FOR WHEEZING OR SHORTNESS OF BREATH. 02/02/21   Mayers, Cari S, PA-C  amLODipine (NORVASC) 10 MG tablet Take 1 tablet (10 mg total) by mouth daily. 02/02/21   Mayers, Cari S, PA-C  atorvastatin (LIPITOR) 20 MG tablet Take 1 tablet (20 mg total) by mouth daily. 02/02/21   Mayers, Cari S, PA-C  TRUEplus Lancets 28G MISC USE AS DIRECTED TWICE DAILY 03/10/20   Charlott Rakes, MD    Allergies    Bee venom and Shellfish allergy  Review of Systems   Review of Systems  Constitutional:  Negative for chills and fever.  HENT:  Negative for  congestion, ear pain and sore throat.   Eyes:  Negative for pain and visual disturbance.  Respiratory:  Positive for cough. Negative for shortness of breath.   Cardiovascular:  Negative for chest pain and palpitations.  Gastrointestinal:  Positive for abdominal pain. Negative for diarrhea, nausea and vomiting.  Genitourinary:  Positive for flank pain. Negative for dysuria and hematuria.  Musculoskeletal:  Negative for arthralgias and back pain.  Skin:  Negative for color change and rash.  Neurological:  Negative for seizures and syncope.  All other systems reviewed and are negative.  Physical Exam Updated Vital Signs BP (!) 132/98 (BP Location: Right Arm)   Pulse 75   Temp 98.2 F (36.8 C) (Oral)   Resp 16   SpO2 95%   Physical Exam Vitals and nursing note reviewed. Exam conducted with a chaperone present.  Constitutional:      Appearance: Normal appearance. She is obese.  HENT:     Head: Normocephalic and atraumatic.  Eyes:     General: No scleral icterus.       Right eye: No discharge.        Left eye: No discharge.     Extraocular Movements: Extraocular movements intact.     Pupils: Pupils are equal, round, and reactive to light.  Cardiovascular:     Rate and Rhythm: Normal rate and regular rhythm.     Pulses: Normal pulses.     Heart sounds: Normal heart sounds. No murmur heard.   No friction rub. No gallop.  Pulmonary:     Effort: Pulmonary effort is normal. No respiratory distress.     Breath sounds: Normal breath sounds.  Abdominal:     General: Abdomen is flat. Bowel sounds are normal. There is no distension.     Palpations: Abdomen is soft.     Tenderness: There is abdominal tenderness in the right upper quadrant. There is right CVA tenderness. There is no left CVA tenderness.     Comments: Right upper quadrant tenderness, positive Murphy.  Positive right CVA tenderness.    Skin:    General: Skin is warm and dry.     Coloration: Skin is not jaundiced.   Neurological:     Mental Status: She is alert. Mental status is at baseline.     Coordination: Coordination normal.    ED Results / Procedures / Treatments   Labs (all labs ordered  are listed, but only abnormal results are displayed) Labs Reviewed  COMPREHENSIVE METABOLIC PANEL - Abnormal; Notable for the following components:      Result Value   Glucose, Bld 109 (*)    Creatinine, Ser 1.17 (*)    Calcium 8.8 (*)    Albumin 3.1 (*)    GFR, Estimated 56 (*)    All other components within normal limits  ACETAMINOPHEN LEVEL - Abnormal; Notable for the following components:   Acetaminophen (Tylenol), Serum <10 (*)    All other components within normal limits  CBC WITH DIFFERENTIAL/PLATELET  LIPASE, BLOOD    EKG None  Radiology US Abdomen Limited RUQ (LIVER/GB)  Result Date: 08/15/2021 CLINICAL DATA:  55 year old female with history of right upper quadrant abdominal pain. EXAM: ULTRASOUND ABDOMEN LIMITED RIGHT UPPER QUADRANT COMPARISON:  Abdominal ultrasound 09/20/2017. FINDINGS: Gallbladder: No gallstones or wall thickening visualized. Gallbladder is only moderately distended. Gallbladder wall thickness is normal at 2.6 mm. Per report from the sonographer, there was a sonographic Murphy's sign on examination. Common bile duct: Diameter: 4.7 mm Liver: No focal lesion identified. Within normal limits in parenchymal echogenicity. Portal vein is patent on color Doppler imaging with normal direction of blood flow towards the liver. Other: None. IMPRESSION: 1. Today's study was remarkable for a positive sonographic Murphy sign. However, there is no gallstones, biliary sludge, gallbladder distension, gallbladder wall thickening or pericholecystic fluid to provide objective sonographic evidence of acute cholecystitis. Further clinical evaluation is recommended. Electronically Signed   By: Vinnie Langton M.D.   On: 08/15/2021 07:22    Procedures Procedures   Medications Ordered in  ED Medications - No data to display  ED Course  I have reviewed the triage vital signs and the nursing notes.  Pertinent labs & imaging results that were available during my care of the patient were reviewed by me and considered in my medical decision making (see chart for details).  Clinical Course as of 08/15/21 1418  Tue Aug 15, 2021  1409 Lipase, blood Lipase negative, doubt this is pancreatitis.  Specially given she denies any heavy alcohol abuse and she is not having significant amounts of nausea vomiting or epigastric pain. [HS]  1410 Comprehensive metabolic panel(!) No gross electrolyte derangement, creatinine is at baseline.  No LFT derangement. [HS]  1410 CBC with Differential No leukocytosis, no anemia [HS]  1410 Acetaminophen level(!) Not an acetaminophen overdose [HS]    Clinical Course User Index [HS] Sherrill Raring, PA-C   MDM Rules/Calculators/A&P                           Patient vitals are stable, work-up is delineated in ED course as a pertains to the differential.  Patient is not febrile without a leukocytosis, though she does have a positive Murphy sign and right upper quadrant tenderness I do not believe she is having cholecystitis based on the ultrasound and these findings.  She does have some right flank pain, urine is not collected at this time but flank pain is consistent with possible nephrolithiasis.  Also be a UTI, I doubt pyelonephritis given no nausea, vomiting, fever.  We will proceed with CT renal to evaluate for that given the patient's significant amount of pain and her benign work-up thus far.    Patient is also concerned about right rib pain so we will proceed with radiograph, she is also having a cough so is possible she has early pneumonia.  She is not tachycardic  or hypoxic, I doubt it is a PE.  Patient denies any chest pain, doubt ACS.  There is evidence of cardiomegaly on the radiograph, patient has a history of diastolic heart failure.  She is not  fluid overloaded per my exam.  Patient CT abdomen is pending at the time of the shift change.  Patient pain is somewhat improved with the pain medicine, I suspect her symptoms are likely a gastritis or GERD.  Could also be nephrolithiasis as the CT is pending.  Suspect patient will be appropriate for discharge home with PPI trial in the absence of any acute or emergent findings on the CT abdomen.  Patient care is signed out to my colleague PA-C Margarita Mail who will follow up.  Please see her note for disposition.  Final Clinical Impression(s) / ED Diagnoses Final diagnoses:  RUQ pain    Rx / DC Orders ED Discharge Orders     None        Sherrill Raring, Hershal Coria 08/16/21 8367    Dorie Rank, MD 08/17/21 0700

## 2021-08-21 ENCOUNTER — Ambulatory Visit: Payer: Self-pay | Admitting: Family Medicine

## 2021-08-22 ENCOUNTER — Ambulatory Visit: Payer: Self-pay | Attending: Family Medicine | Admitting: Physician Assistant

## 2021-08-22 ENCOUNTER — Other Ambulatory Visit: Payer: Self-pay

## 2021-08-22 ENCOUNTER — Encounter: Payer: Self-pay | Admitting: Physician Assistant

## 2021-08-22 DIAGNOSIS — E785 Hyperlipidemia, unspecified: Secondary | ICD-10-CM

## 2021-08-22 DIAGNOSIS — R110 Nausea: Secondary | ICD-10-CM

## 2021-08-22 DIAGNOSIS — I10 Essential (primary) hypertension: Secondary | ICD-10-CM

## 2021-08-22 DIAGNOSIS — R1084 Generalized abdominal pain: Secondary | ICD-10-CM

## 2021-08-22 MED ORDER — AMLODIPINE BESYLATE 10 MG PO TABS
10.0000 mg | ORAL_TABLET | Freq: Every day | ORAL | 1 refills | Status: DC
  Filled 2021-08-22: qty 30, 30d supply, fill #0

## 2021-08-22 MED ORDER — ONDANSETRON 4 MG PO TBDP
4.0000 mg | ORAL_TABLET | Freq: Three times a day (TID) | ORAL | 0 refills | Status: DC | PRN
  Filled 2021-08-22: qty 20, 7d supply, fill #0

## 2021-08-22 MED ORDER — PANTOPRAZOLE SODIUM 20 MG PO TBEC
20.0000 mg | DELAYED_RELEASE_TABLET | Freq: Every day | ORAL | 0 refills | Status: DC
  Filled 2021-08-22: qty 30, 30d supply, fill #0

## 2021-08-22 NOTE — Progress Notes (Signed)
Established Patient Office Visit  Subjective:  Patient ID: Taylor Vazquez, female    DOB: 18-Sep-1966  Age: 55 y.o. MRN: [id number]  CC:  Chief Complaint  Patient presents with   Abdominal Pain   Virtual Visit via Telephone Note  I connected with Taylor Vazquez on 08/22/21 at  1:50 PM EDT by telephone and verified that I am speaking with the correct person using two identifiers.  Location: Patient: Home Provider: [place]    I discussed the limitations, risks, security and privacy concerns of performing an evaluation and management service by telephone and the availability of in person appointments. I also discussed with the patient that there may be a patient responsible charge related to this service. The patient expressed understanding and agreed to proceed.   History of Present Illness:   States that she was seen at the emergency department on 08/15/21, hospital note:   Patient vitals are stable, work-up is delineated in ED course as a pertains to the differential.  Patient is not febrile without a leukocytosis, though she does have a positive Murphy sign and right upper quadrant tenderness I do not believe she is having cholecystitis based on the ultrasound and these findings.  She does have some right flank pain, urine is not collected at this time but flank pain is consistent with possible nephrolithiasis.  Also be a UTI, I doubt pyelonephritis given no nausea, vomiting, fever.  We will proceed with CT renal to evaluate for that given the patient's significant amount of pain and her benign work-up thus far.     Patient is also concerned about right rib pain so we will proceed with radiograph, she is also having a cough so is possible she has early pneumonia.  She is not tachycardic or hypoxic, I doubt it is a PE.  Patient denies any chest pain, doubt ACS.  There is evidence of cardiomegaly on the radiograph, patient has a history of diastolic  heart failure.  She is not fluid overloaded per my exam.   Patient CT abdomen is pending at the time of the shift change.  Patient pain is somewhat improved with the pain medicine, I suspect her symptoms are likely a gastritis or GERD.  Could also be nephrolithiasis as the CT is pending.  Suspect patient will be appropriate for discharge home with PPI trial in the absence of any acute or emergent findings on the CT abdomen.  Patient care is signed out to my colleague PA-C Margarita Mail who will follow up.  Please see her note for disposition  IMPRESSION: No evidence of urinary tract calculus or obstruction.   No signs of acute gallbladder or appendiceal pathology.   Mildly lobular hepatic contours and signs of mild fissural widening. Correlate with any clinical or laboratory evidence of liver disease.   Small hiatal hernia.   Scattered colonic diverticulosis without evidence of acute gastrointestinal process.   Aortic Atherosclerosis (ICD10-I70.0).     Electronically Signed   By: Zetta Bills M.D.   On: 08/15/2021 15:52  States today that she was unable to afford the medications and requests that they be sent to [facility].  States that she does continue to have RUQ pain and nausea, per ED did she did admit to taking too much tylenol the week previous due to dental pain.  She does states that she has stopped taking that.   States that she has been taking her BP medication on a daily basis, does  not check BP at home.   Observations/Objective: Medical history and current medications reviewed, no physical exam completed   Past Medical History:  Diagnosis Date   Asthma    CHF (congestive heart failure) (HCC)    Depression    Hypertension    Obesity    Sickle cell trait (HCC)    Stroke [facility])     Past Surgical History:  Procedure Laterality Date   NO PAST SURGERIES      Family History  Problem Relation Age of Onset   Hypertension Mother    Hypertension Father    Cancer  Father    Hypertension Other    Sickle cell trait Other    Diabetes Other    Diabetes Son    Other Neg Hx     Social History   Socioeconomic History   Marital status: Single    Spouse name: Not on file   Number of children: 4   Years of education: Not on file   Highest education level: 12th grade  Occupational History   Occupation: PCA    Employer: [employer]  Tobacco Use   Smoking status: Some Days    Packs/day: 0.25    Types: Cigarettes   Smokeless tobacco: Never  Substance and Sexual Activity   Alcohol use: Not Currently    Alcohol/week: 0.0 standard drinks    Comment: occ, previously drank (2) 40oz for 2-3 years, quit 2005   Drug use: No    Comment: denies crack since 3 or4 months   Sexual activity: Yes    Birth control/protection: None  Other Topics Concern   Not on file  Social History Narrative   Patient is right-handed. She is single. She lives in a first floor apartment. She walks to work each day, using a can or walker.   She works as a Programmer, applications.   Social Determinants of Health   Financial Resource Strain: Not on file  Food Insecurity: Not on file  Transportation Needs: Not on file  Physical Activity: Not on file  Stress: Not on file  Social Connections: Not on file  Intimate Partner Violence: Not on file    Outpatient Medications Prior to Visit  Medication Sig Dispense Refill   acetaminophen (TYLENOL) 325 MG tablet Take 2 tablets (650 mg total) by mouth every 6 (six) hours as needed for mild pain or moderate pain.     albuterol (VENTOLIN HFA) 108 (90 Base) MCG/ACT inhaler INHALE 2 PUFFS INTO THE LUNGS EVERY 6 (SIX) HOURS AS NEEDED FOR WHEEZING OR SHORTNESS OF BREATH. 18 g 1   atorvastatin (LIPITOR) 20 MG tablet Take 1 tablet (20 mg total) by mouth daily. 30 tablet 1   traMADol (ULTRAM) 50 MG tablet Take 1 tablet (50 mg total) by mouth every 6 (six) hours as needed. 15 tablet 0   TRUEplus Lancets 28G MISC USE AS DIRECTED TWICE DAILY 100  each 0   amLODipine (NORVASC) 10 MG tablet Take 1 tablet (10 mg total) by mouth daily. 30 tablet 1   ondansetron (ZOFRAN ODT) 4 MG disintegrating tablet Take 1 tablet (4 mg total) by mouth every 8 (eight) hours as needed for nausea or vomiting. 20 tablet 0   pantoprazole (PROTONIX) 20 MG tablet Take 1 tablet (20 mg total) by mouth daily. 30 tablet 0   Facility-Administered Medications Prior to Visit  Medication Dose Route Frequency Provider Last Rate Last Admin   albuterol (PROVENTIL) (2.5 MG/3ML) 0.083% nebulizer solution 2.5 mg  2.5 mg  Nebulization Q4H PRN Charlott Rakes, MD        Allergies  Allergen Reactions   Bee Venom Anaphylaxis   Shellfish Allergy Anaphylaxis and Swelling    ROS Review of Systems  Constitutional:  Negative for chills and fever.  HENT: Negative.    Eyes: Negative.   Respiratory:  Negative for cough and shortness of breath.   Cardiovascular:  Negative for chest pain.  Gastrointestinal:  Positive for abdominal pain and nausea. Negative for diarrhea and vomiting.  Endocrine: Negative.   Genitourinary: Negative.   Musculoskeletal: Negative.   Skin: Negative.   Allergic/Immunologic: Negative.   Neurological: Negative.   Hematological: Negative.   Psychiatric/Behavioral: Negative.       Objective:      There were no vitals taken for this visit. Wt Readings from Last 3 Encounters:  02/02/21 223 lb (101.2 kg)  02/01/21 (!) 312 lb (141.5 kg)  01/02/19 227 lb (103 kg)     Health Maintenance Due  Topic Date Due   COVID-19 Vaccine (1) Never done   Pneumococcal Vaccine 60-65 Years old (1 - PCV) Never done   Hepatitis C Screening  Never done   TETANUS/TDAP  Never done   Zoster Vaccines- Shingrix (1 of 2) Never done   COLONOSCOPY (Pts 45-37yrs Insurance coverage will need to be confirmed)  Never done   MAMMOGRAM  11/09/2017   PAP SMEAR-Modifier  09/28/2018   INFLUENZA VACCINE  05/22/2021    There are no preventive care reminders to display for  this patient.  Lab Results  Component Value Date   TSH 2.120 06/18/2018   Lab Results  Component Value Date   WBC 7.5 08/15/2021   HGB 15.0 08/15/2021   HCT 45.0 08/15/2021   MCV 88.2 08/15/2021   PLT 220 08/15/2021   Lab Results  Component Value Date   NA 138 08/15/2021   K 3.6 08/15/2021   CO2 24 08/15/2021   GLUCOSE 109 (H) 08/15/2021   BUN 12 08/15/2021   CREATININE 1.17 (H) 08/15/2021   BILITOT 0.5 08/15/2021   ALKPHOS 103 08/15/2021   AST 17 08/15/2021   ALT 17 08/15/2021   PROT 6.5 08/15/2021   ALBUMIN 3.1 (L) 08/15/2021   CALCIUM 8.8 (L) 08/15/2021   ANIONGAP 8 08/15/2021   Lab Results  Component Value Date   CHOL 182 04/26/2017   Lab Results  Component Value Date   HDL 46 04/26/2017   Lab Results  Component Value Date   LDLCALC 117 (H) 04/26/2017   Lab Results  Component Value Date   TRIG 95 04/26/2017   Lab Results  Component Value Date   CHOLHDL 4.0 04/26/2017   Lab Results  Component Value Date   HGBA1C 5.3 02/02/2021      Assessment & Plan:   Problem List Items Addressed This Visit       Cardiovascular and Mediastinum   Hypertension   Relevant Medications   amLODipine (NORVASC) 10 MG tablet     Other   Hyperlipidemia LDL goal <100   Relevant Medications   amLODipine (NORVASC) 10 MG tablet   Other Visit Diagnoses     Generalized abdominal pain    -  Primary   Relevant Medications   pantoprazole (PROTONIX) 20 MG tablet   Nausea       Relevant Medications   ondansetron (ZOFRAN ODT) 4 MG disintegrating tablet       Meds ordered this encounter  Medications   ondansetron (ZOFRAN ODT) 4 MG disintegrating tablet  Sig: Take 1 tablet (4 mg total) by mouth every 8 (eight) hours as needed for nausea or vomiting.    Dispense:  20 tablet    Refill:  0    Order Specific Question:   Supervising Provider    Answer:   Asencion Noble E [[id number]]   pantoprazole (PROTONIX) 20 MG tablet    Sig: Take 1 tablet (20 mg total) by mouth  daily.    Dispense:  30 tablet    Refill:  0    Order Specific Question:   Supervising Provider    Answer:   Asencion Noble E [[id number]]   amLODipine (NORVASC) 10 MG tablet    Sig: Take 1 tablet (10 mg total) by mouth daily.    Dispense:  30 tablet    Refill:  1    Order Specific Question:   Supervising Provider    Answer:   Asencion Noble E [[id number]]   Assessment and Plan: 1. Generalized abdominal pain Medications sent to [facility] pharmacy to help patient with financial constraints. Patient encouraged to set appointment with PCP.  Red flags given for prompt reevaluation - pantoprazole (PROTONIX) 20 MG tablet; Take 1 tablet (20 mg total) by mouth daily.  Dispense: 30 tablet; Refill: 0  2. Nausea  - ondansetron (ZOFRAN ODT) 4 MG disintegrating tablet; Take 1 tablet (4 mg total) by mouth every 8 (eight) hours as needed for nausea or vomiting.  Dispense: 20 tablet; Refill: 0  3. Primary hypertension Patient encouraged to check blood pressure at home, keep a written log and have available for all office visits. - amLODipine (NORVASC) 10 MG tablet; Take 1 tablet (10 mg total) by mouth daily.  Dispense: 30 tablet; Refill: 1  4. Hyperlipidemia LDL goal <100 Patient stated she did not need refill of cholesterol medication.  AVS not created, patient declines my chart Follow Up Instructions:    I discussed the assessment and treatment plan with the patient. The patient was provided an opportunity to ask questions and all were answered. The patient agreed with the plan and demonstrated an understanding of the instructions.   The patient was advised to call back or seek an in-person evaluation if the symptoms worsen or if the condition fails to improve as anticipated.  I provided 21 minutes of non-face-to-face time during this encounter.   Loraine Grip Mayers, PA-C

## 2021-08-29 ENCOUNTER — Other Ambulatory Visit: Payer: Self-pay

## 2022-01-11 ENCOUNTER — Ambulatory Visit: Payer: Self-pay | Admitting: *Deleted

## 2022-01-11 NOTE — Telephone Encounter (Signed)
?  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 She can not leave until 6 when she will take the bus straight to [facility] ED. Pt advised of if symptoms worsen call EMS or call back. She left inhaler at home. Appt scheduled for April to use as a follow up. ? ?Reason for Disposition ? [1] MODERATE or SEVERE asthma attack AND [2] doesn't have neb or inhaler available ? ?Answer Assessment - Initial Assessment Questions ?1. SEVERITY: "How bad is this attack? Describe your child's breathing. What does it sound like?" ?* MILD: no SOB at rest, mild SOB with walking, speaks normally in sentences, can lay down flat,  no retractions, wheezes only heard by stethoscope (GREEN Zone: PEFR 80-100%)  ?* MODERATE: SOB at rest, speaks in phrases, prefers to sit (can't lay down flat), mild retractions, audible wheezing (YELLOW Zone: PEFR 50-80%) ?* SEVERE: severe SOB at rest, speaks in single words (struggling to breathe), severe retractions, usually loud wheezing or sometimes minimal wheezing because of decreased air movement (RED Zone: PEFR < 50%)  ?* MODERATE and SEVERE asthma attacks also interfere with normal activities and sleep (Reason: too hypoxic to sleep). SEVERE hypoxia can also cause confusion or altered mental status.  ?    On going, has inhaler, has treatments but not a nebulizer ?2. PEAK EXPIRATORY FLOW RATE (PEFR): "Do you use a peak flow meter?" If so, ask: "What's the current peak flow? What's your child's normal peak flow?" (AGE 18 years or older). ?    na ?3. ONSET: "When did this asthma attack start?"  ?    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  TRIGGER: "What do you think triggered this attack?" (e.g. URI, exposure to pollen or other allergen, tobacco smoke)  ?    smoking ?5. INHALED RESCUE MEDS (inhaler or nebs): "What is your child's asthma rescue medicine?" Note: The neb or inhaler rescue treatments listed in the triage questions refers to quick-relief SINGLE medicines such as albuterol, xopenex or salbutamol ([place]). CAUTION:  If patient is using a COMBINATION medicine (Symbicort, Dulera, Advair, Breo, AirDuo Respiclick) as a rescue medicine consult PCP for dosing more frequent than every 4 hours.  ?    Na, has inhaler, will get it after work ?6. INHALED STEROID: "Does your child also take an inhaled steroid (e.g., Pulmicort, Flovent, Qvar, etc )?" Controller or maintenance asthma medicines refer to anti-inflammatory medicines such as inhaled steroids or oral singulair. They are not helpful at reversing acute asthma attacks. ? ?Protocols used: Asthma-P-AH ? ?

## 2022-01-30 ENCOUNTER — Telehealth: Payer: Self-pay | Admitting: Family Medicine

## 2022-01-30 NOTE — Telephone Encounter (Signed)
Medication Refill - Medication: traMADol (ULTRAM) 50 MG tablet  ?amLODipine (NORVASC) 10 MG tablet  ?Metformin  ? ?Needs new breathing machine she says  ? ?Symbicort (says Albuterol does not work)  ? ? ?Pt also wants to speak to a nurse  ? ?Has the patient contacted their pharmacy? Yes.   ?(Agent: If no, request that the patient contact the pharmacy for the refill. If patient does not wish to contact the pharmacy document the reason why and proceed with request.) ?(Agent: If yes, when and what did the pharmacy advise?) ? ?Preferred Pharmacy (with phone number or street name):  ?[facility] [address], [place] [address]  ?Phone: [phone] Fax: [phone]  ? ?Has the patient been seen for an appointment in the last year OR does the patient have an upcoming appointment? Yes.   ? ?Agent: Please be advised that RX refills may take up to 3 business days. We ask that you follow-up with your pharmacy. ? ?

## 2022-01-30 NOTE — Telephone Encounter (Signed)
Attempted to call patient to get more information regarding her request for " breathing machine"-( nebulizer vs CPAP) left message to call office.  ?

## 2022-01-30 NOTE — Telephone Encounter (Signed)
Pt called back in, she is stating that she is needing a new nebulizer machine since she moved she is unable to find hers and she hadn't a new one in 8 years. Pt also states that she needs those medications refilled. I advised her that metformin isn't on her medication list. Pt states that she has taken this medication for years and needed it refiled. She states that her son died and she had stopped coming to appts and had appt today but mother is sick and rescheduled appt. I advised her I will send to provider for refill request.  ? ?traMADol (ULTRAM) 50 MG tablet ?amLODipine (NORVASC) 10 MG tablet  ?Metformin  ?  ?Needs new breathing machine she says  ?  ?Symbicort (says Albuterol does not work)  ?

## 2022-01-31 ENCOUNTER — Ambulatory Visit: Payer: Self-pay | Admitting: Physician Assistant

## 2022-01-31 NOTE — Telephone Encounter (Signed)
Left voicemail for patient to return call next week.  ?Unable to refill rx requested. Needs OV and lab test.  ?

## 2022-02-01 ENCOUNTER — Ambulatory Visit: Payer: Self-pay | Admitting: Physician Assistant

## 2022-02-06 ENCOUNTER — Ambulatory Visit (HOSPITAL_BASED_OUTPATIENT_CLINIC_OR_DEPARTMENT_OTHER): Payer: Self-pay | Admitting: Nurse Practitioner

## 2022-02-06 ENCOUNTER — Other Ambulatory Visit: Payer: Self-pay

## 2022-02-06 ENCOUNTER — Telehealth: Payer: Self-pay | Admitting: Nurse Practitioner

## 2022-02-06 ENCOUNTER — Encounter: Payer: Self-pay | Admitting: Nurse Practitioner

## 2022-02-06 DIAGNOSIS — J438 Other emphysema: Secondary | ICD-10-CM

## 2022-02-06 DIAGNOSIS — R7303 Prediabetes: Secondary | ICD-10-CM

## 2022-02-06 DIAGNOSIS — K219 Gastro-esophageal reflux disease without esophagitis: Secondary | ICD-10-CM

## 2022-02-06 DIAGNOSIS — I10 Essential (primary) hypertension: Secondary | ICD-10-CM

## 2022-02-06 DIAGNOSIS — E785 Hyperlipidemia, unspecified: Secondary | ICD-10-CM

## 2022-02-06 MED ORDER — METFORMIN HCL 500 MG PO TABS
500.0000 mg | ORAL_TABLET | Freq: Every day | ORAL | 0 refills | Status: DC
  Filled 2022-02-06: qty 30, 30d supply, fill #0

## 2022-02-06 MED ORDER — PANTOPRAZOLE SODIUM 20 MG PO TBEC
20.0000 mg | DELAYED_RELEASE_TABLET | Freq: Every day | ORAL | 0 refills | Status: AC
  Filled 2022-02-06: qty 30, 30d supply, fill #0

## 2022-02-06 MED ORDER — ALBUTEROL SULFATE HFA 108 (90 BASE) MCG/ACT IN AERS
INHALATION_SPRAY | RESPIRATORY_TRACT | 1 refills | Status: AC
  Filled 2022-02-06: qty 18, 25d supply, fill #0

## 2022-02-06 MED ORDER — ATORVASTATIN CALCIUM 20 MG PO TABS
20.0000 mg | ORAL_TABLET | Freq: Every day | ORAL | 1 refills | Status: DC
  Filled 2022-02-06: qty 30, 30d supply, fill #0

## 2022-02-06 MED ORDER — AMLODIPINE BESYLATE 10 MG PO TABS
10.0000 mg | ORAL_TABLET | Freq: Every day | ORAL | 0 refills | Status: DC
  Filled 2022-02-06: qty 30, 30d supply, fill #0

## 2022-02-06 NOTE — Telephone Encounter (Signed)
No answer. LVM ?

## 2022-02-06 NOTE — Progress Notes (Signed)
Virtual Visit via Telephone Note ? I discussed the limitations, risks, security and privacy concerns of performing an evaluation and management service by telephone and the availability of in person appointments. I also discussed with the patient that there may be a patient responsible charge related to this service. The patient expressed understanding and agreed to proceed.  ? ? ?I connected with Taylor Vazquez on 02/06/22  at   9:10 AM EDT by telephone and verified that I am speaking with the correct person using two identifiers. ? ?Location of Patient: ?Private Residence ?  ?Location of Provider: ?Scientist, research (physical sciences) and [employer] Office  ?  ?Persons participating in Telemedicine visit: ?Geryl Rankins FNP-BC ?Nyla R Darcey  ?  ?History of Present Illness: ?Telemedicine visit for: Asthma ?She has a past medical history of Asthma, CHF, Depression, Hypertension, Obesity, Sickle cell trait, and Stroke.  ? ?Requesting nebulizer machine. She will need an office visit  or virtual visit for this. She  has not seen her PCP in 3 years.  ? ? ?HTN ?Blood pressure is not well controlled. She is prescribed amlodipine 10 mg daily. She does not monitor her blood pressure at home.  ?BP Readings from Last 3 Encounters:  ?08/15/21 (!) 166/98  ?02/07/21 (!) 151/99  ?02/02/21 (!) 195/128  ?  ?GERD ?Requesting refill of pantoprazole for acid reflux symptoms which are controlled with PPI.  ? ?Past Medical History:  ?Diagnosis Date  ? Asthma   ? CHF (congestive heart failure) ([place])   ? Depression   ? Hypertension   ? Obesity   ? Sickle cell trait ([place])   ? Stroke [facility])   ?  ?Past Surgical History:  ?Procedure Laterality Date  ? NO PAST SURGERIES    ?  ?Family History  ?Problem Relation Age of Onset  ? Hypertension Mother   ? Hypertension Father   ? Cancer Father   ? Hypertension Other   ? Sickle cell trait Other   ? Diabetes Other   ? Diabetes Son   ? Other Neg Hx   ?  ?Social History  ? ?Socioeconomic History  ? Marital  status: Single  ?  Spouse name: Not on file  ? Number of children: 4  ? Years of education: Not on file  ? Highest education level: 12th grade  ?Occupational History  ? Occupation: PCA  ?  Employer: [employer]  ?Tobacco Use  ? Smoking status: Some Days  ?  Packs/day: 0.25  ?  Types: Cigarettes  ? Smokeless tobacco: Never  ?Substance and Sexual Activity  ? Alcohol use: Not Currently  ?  Alcohol/week: 0.0 standard drinks  ?  Comment: occ, previously drank (2) 40oz for 2-3 years, quit 2005  ? Drug use: No  ?  Comment: denies crack since 3 or4 months  ? Sexual activity: Yes  ?  Birth control/protection: None  ?Other Topics Concern  ? Not on file  ?Social History Narrative  ? Patient is right-handed. She is single. She lives in a first floor apartment. She walks to work each day, using a can or walker.  ? She works as a Programmer, applications.  ? ?Social Determinants of Health  ? ?Financial Resource Strain: Not on file  ?Food Insecurity: Not on file  ?Transportation Needs: Not on file  ?Physical Activity: Not on file  ?Stress: Not on file  ?Social Connections: Not on file  ?  ? ?Observations/Objective: ?Awake, alert and oriented x 3 ? ? ?Review of Systems  ?Constitutional:  Negative for fever, malaise/fatigue and weight loss.  ?HENT: Negative.  Negative for nosebleeds.   ?Eyes: Negative.  Negative for blurred vision, double vision and photophobia.  ?Respiratory: Negative.  Negative for cough and shortness of breath.   ?Cardiovascular: Negative.  Negative for chest pain, palpitations and leg swelling.  ?Gastrointestinal:  Positive for heartburn. Negative for nausea and vomiting.  ?Musculoskeletal: Negative.  Negative for myalgias.  ?Neurological: Negative.  Negative for dizziness, focal weakness, seizures and headaches.  ?Psychiatric/Behavioral: Negative.  Negative for suicidal ideas.    ?Assessment and Plan: ?Diagnoses and all orders for this visit: ? ?Primary hypertension ?-     amLODipine (NORVASC) 10 MG tablet; Take  1 tablet (10 mg total) by mouth daily. ?DASH DIET ?Needs to bring BP log into each office visit for review ?She is dietary nonadherent ? ?Gastroesophageal reflux disease without esophagitis ?-     pantoprazole (PROTONIX) 20 MG tablet; Take 1 tablet (20 mg total) by mouth daily. ?INSTRUCTIONS: Avoid GERD Triggers: acidic, spicy or fried foods, caffeine, coffee, sodas,  alcohol and chocolate.   ? ?Other emphysema (HCC) ?-     albuterol (VENTOLIN HFA) 108 (90 Base) MCG/ACT inhaler; INHALE 2 PUFFS INTO THE LUNGS EVERY 6 (SIX) HOURS AS NEEDED FOR WHEEZING OR SHORTNESS OF BREATH. ?Recommend smoking cessation ? ?Hyperlipidemia LDL goal <100 ?-     atorvastatin (LIPITOR) 20 MG tablet; Take 1 tablet (20 mg total) by mouth daily. ? ?Prediabetes ?-     metFORMIN (GLUCOPHAGE) 500 MG tablet; Take 1 tablet (500 mg total) by mouth daily with breakfast. ?Resumed metformin at lower dose due to most recent a1c and risk for hypoglycemia ?Lab Results  ?Component Value Date  ? HGBA1C 5.3 02/02/2021  ?  ?  ? ?Follow Up Instructions ?Return for HTN.  ? ?  ?I discussed the assessment and treatment plan with the patient. The patient was provided an opportunity to ask questions and all were answered. The patient agreed with the plan and demonstrated an understanding of the instructions. ?  ?The patient was advised to call back or seek an in-person evaluation if the symptoms worsen or if the condition fails to improve as anticipated. ? ?I provided 11 minutes of non-face-to-face time during this encounter including median intraservice time, reviewing previous notes, labs, imaging, medications and explaining diagnosis and management. ? ?Gildardo Pounds, FNP-BC  ?

## 2022-02-07 ENCOUNTER — Other Ambulatory Visit: Payer: Self-pay

## 2022-02-13 ENCOUNTER — Other Ambulatory Visit: Payer: Self-pay

## 2022-02-22 ENCOUNTER — Ambulatory Visit: Payer: Self-pay | Admitting: Physician Assistant

## 2022-03-28 ENCOUNTER — Ambulatory Visit: Payer: Self-pay | Admitting: Physician Assistant

## 2022-03-29 ENCOUNTER — Ambulatory Visit: Payer: Self-pay

## 2022-03-30 ENCOUNTER — Ambulatory Visit: Payer: Self-pay | Attending: Family Medicine

## 2022-03-30 DIAGNOSIS — Z111 Encounter for screening for respiratory tuberculosis: Secondary | ICD-10-CM

## 2022-03-30 LAB — GLUCOSE, POCT (MANUAL RESULT ENTRY): POC Glucose: 115 mg/dl — AB (ref 70–99)

## 2022-04-02 LAB — TB SKIN TEST
Induration: 0 mm
TB Skin Test: NEGATIVE

## 2022-04-26 ENCOUNTER — Ambulatory Visit: Payer: Self-pay | Admitting: Physician Assistant

## 2022-05-16 ENCOUNTER — Encounter: Payer: Self-pay | Admitting: Physician Assistant

## 2022-05-16 ENCOUNTER — Other Ambulatory Visit: Payer: Self-pay

## 2022-05-16 ENCOUNTER — Ambulatory Visit: Payer: Self-pay | Attending: Physician Assistant | Admitting: Physician Assistant

## 2022-05-16 ENCOUNTER — Telehealth: Payer: Self-pay | Admitting: Physician Assistant

## 2022-05-16 VITALS — BP 163/116 | HR 89 | Ht 63.0 in | Wt 219.4 lb

## 2022-05-16 DIAGNOSIS — R7303 Prediabetes: Secondary | ICD-10-CM

## 2022-05-16 DIAGNOSIS — F32A Depression, unspecified: Secondary | ICD-10-CM

## 2022-05-16 DIAGNOSIS — Z7141 Alcohol abuse counseling and surveillance of alcoholic: Secondary | ICD-10-CM

## 2022-05-16 DIAGNOSIS — I10 Essential (primary) hypertension: Secondary | ICD-10-CM

## 2022-05-16 DIAGNOSIS — M79605 Pain in left leg: Secondary | ICD-10-CM

## 2022-05-16 DIAGNOSIS — F4320 Adjustment disorder, unspecified: Secondary | ICD-10-CM

## 2022-05-16 DIAGNOSIS — J4540 Moderate persistent asthma, uncomplicated: Secondary | ICD-10-CM

## 2022-05-16 DIAGNOSIS — E785 Hyperlipidemia, unspecified: Secondary | ICD-10-CM

## 2022-05-16 DIAGNOSIS — F4321 Adjustment disorder with depressed mood: Secondary | ICD-10-CM

## 2022-05-16 MED ORDER — ALBUTEROL SULFATE (2.5 MG/3ML) 0.083% IN NEBU
2.5000 mg | INHALATION_SOLUTION | Freq: Four times a day (QID) | RESPIRATORY_TRACT | 1 refills | Status: AC | PRN
  Filled 2022-05-16 – 2022-05-25 (×2): qty 150, 13d supply, fill #0

## 2022-05-16 MED ORDER — ATORVASTATIN CALCIUM 20 MG PO TABS
20.0000 mg | ORAL_TABLET | Freq: Every day | ORAL | 1 refills | Status: AC
  Filled 2022-05-16 – 2022-05-25 (×2): qty 30, 30d supply, fill #0

## 2022-05-16 MED ORDER — SERTRALINE HCL 50 MG PO TABS
50.0000 mg | ORAL_TABLET | Freq: Every day | ORAL | 5 refills | Status: AC
  Filled 2022-05-16 – 2022-05-25 (×2): qty 30, 30d supply, fill #0

## 2022-05-16 MED ORDER — AMLODIPINE BESYLATE 10 MG PO TABS
10.0000 mg | ORAL_TABLET | Freq: Every day | ORAL | 0 refills | Status: AC
  Filled 2022-05-16 – 2022-05-25 (×2): qty 30, 30d supply, fill #0

## 2022-05-16 MED ORDER — METFORMIN HCL 500 MG PO TABS
500.0000 mg | ORAL_TABLET | Freq: Every day | ORAL | 0 refills | Status: AC
  Filled 2022-05-16 – 2022-05-25 (×2): qty 30, 30d supply, fill #0

## 2022-05-16 MED ORDER — BUDESONIDE-FORMOTEROL FUMARATE 80-4.5 MCG/ACT IN AERO
2.0000 | INHALATION_SPRAY | Freq: Two times a day (BID) | RESPIRATORY_TRACT | 3 refills | Status: DC
  Filled 2022-05-16 (×2): qty 10.2, 30d supply, fill #0

## 2022-05-16 MED ORDER — TRAMADOL HCL 50 MG PO TABS: 50.0000 mg | ORAL_TABLET | Freq: Four times a day (QID) | ORAL | 0 refills | Status: DC | PRN

## 2022-05-16 MED ORDER — TRAMADOL HCL 50 MG PO TABS
ORAL_TABLET | ORAL | 0 refills | Status: AC
Start: 2022-05-16 — End: ?
  Filled 2022-05-16 – 2022-05-25 (×2): qty 15, 4d supply, fill #0

## 2022-05-16 NOTE — Progress Notes (Signed)
Patient ID: Taylor Vazquez, female   DOB: October 25, 1965, 56 y.o.   MRN: [id number]   Revella Shelton, is a 56 y.o. female  [id number]:[id number]  YKD:[id number]  DOB - November 30, 1965  Chief Complaint  Patient presents with   Shortness of Breath   Medication Refill   Depression       Subjective:   Taylor Vazquez is a 56 y.o. female here today for multiple issues.  She has been having depression since her son passed away 3 years ago.  She has 3 surviving children that are supportive.  She did some grief counseling prior to covid but none since.  She denies SI/HI.  She has supportive family/friends/church.  She is open to trying medication.    She admits to drinking a lot of alcohol lately.  She is drinking about 3 40 oz beers daily and occasional liquor too.  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  She never felt as though she had a problem with alcohol until more recently.   Not taking any of her prescription meds bc she just hasn't been caring about her health.    She is requesting a nebulizer machine and symbicort.    Denies HA/dizziness/CP   No problems updated.  ALLERGIES: Allergies  Allergen Reactions   Bee Venom Anaphylaxis   Shellfish Allergy Anaphylaxis and Swelling    PAST MEDICAL HISTORY: Past Medical History:  Diagnosis Date   Asthma    CHF (congestive heart failure) (HCC)    Depression    Hypertension    Obesity    Sickle cell trait ([place])    Stroke ([place])     MEDICATIONS AT HOME: Prior to Admission medications   Medication Sig Start Date End Date Taking? Authorizing Provider  albuterol (PROVENTIL) (2.5 MG/3ML) 0.083% nebulizer solution Take 3 mLs (2.5 mg total) by nebulization every 6 (six) hours as needed for wheezing or shortness of breath. 05/16/22  Yes Algis Lehenbauer M, PA-C  budesonide-formoterol (SYMBICORT) 80-4.5 MCG/ACT inhaler Inhale 2 puffs into the lungs 2 (two) times daily. 05/16/22  Yes Argentina Donovan, PA-C  sertraline (ZOLOFT) 50 MG tablet Take 1 tablet (50 mg total) by mouth daily. 05/16/22  Yes Chrystle Murillo, Dionne Bucy, PA-C  acetaminophen (TYLENOL) 325 MG tablet Take 2 tablets (650 mg total) by mouth every 6 (six) hours as needed for mild pain or moderate pain. 06/10/15   Kinnie Feil, MD  albuterol (VENTOLIN HFA) 108 (90 Base) MCG/ACT inhaler INHALE 2 PUFFS INTO THE LUNGS EVERY 6 (SIX) HOURS AS NEEDED FOR WHEEZING OR SHORTNESS OF BREATH. 02/06/22   Gildardo Pounds, NP  amLODipine (NORVASC) 10 MG tablet Take 1 tablet (10 mg total) by mouth daily. 05/16/22   Argentina Donovan, PA-C  atorvastatin (LIPITOR) 20 MG tablet Take 1 tablet (20 mg total) by mouth daily. 05/16/22   Argentina Donovan, PA-C  metFORMIN (GLUCOPHAGE) 500 MG tablet Take 1 tablet (500 mg total) by mouth daily with breakfast. 05/16/22 06/15/22  Argentina Donovan, PA-C  ondansetron (ZOFRAN ODT) 4 MG disintegrating tablet Take 1 tablet (4 mg total) by mouth every 8 (eight) hours as needed for nausea or vomiting. 08/22/21   Mayers, Cari S, PA-C  pantoprazole (PROTONIX) 20 MG tablet Take 1 tablet (20 mg total) by mouth daily. 02/06/22   Gildardo Pounds, NP  traMADol (ULTRAM) 50 MG tablet Take 1 tablet (50 mg total) by mouth every 6 (six) hours  as needed. 05/16/22   Argentina Donovan, PA-C  TRUEplus Lancets 28G MISC USE AS DIRECTED TWICE DAILY 03/10/20   Newlin, Charlane Ferretti, MD    ROS: Neg HEENT Neg resp Neg cardiac Neg GI Neg GU Neg MS Neg psych Neg neuro  Objective:   Vitals:   05/16/22 1406  BP: (!) 163/116  Pulse: 89  SpO2: 91%  Weight: 219 lb 6.4 oz (99.5 kg)  Height: '5\' 3"'$  (1.6 m)   Exam General appearance : Awake, alert, not in any distress. Speech Clear. Not toxic looking; tearful at times HEENT: Atraumatic and Normocephalic Neck: Supple, no JVD. No cervical lymphadenopathy.  Chest: Good air entry bilaterally, CTAB.  No rales/rhonchi/wheezing CVS: S1 S2 regular, no murmurs.  Extremities: B/L Lower Ext shows  no edema, both legs are warm to touch Neurology: Awake alert, and oriented X 3, CN II-XII intact, Non focal Skin: No Rash  Data Review Lab Results  Component Value Date   HGBA1C 5.3 02/02/2021   HGBA1C 5.4 12/31/2018   HGBA1C 5.8 (A) 07/24/2018   HGBA1C 5.8 07/24/2018   HGBA1C 5.8 07/24/2018   HGBA1C 5.8 07/24/2018    Assessment & Plan   1. Hyperlipidemia LDL goal <100 - atorvastatin (LIPITOR) 20 MG tablet; Take 1 tablet (20 mg total) by mouth daily.  Dispense: 30 tablet; Refill: 1 - Comprehensive metabolic panel  2. Prediabetes I have had a lengthy discussion and provided education about insulin resistance and the intake of too much sugar/refined carbohydrates.  I have advised the patient to work at a goal of eliminating sugary drinks, candy, desserts, sweets, refined sugars, processed foods, and white carbohydrates.  The patient expresses understanding.   - metFORMIN (GLUCOPHAGE) 500 MG tablet; Take 1 tablet (500 mg total) by mouth daily with breakfast.  Dispense: 30 tablet; Refill: 0 - Hemoglobin A1c - Comprehensive metabolic panel  3. Primary hypertension Uncontrolled.  Compliance imperative.  Patient verbalizes understanding - amLODipine (NORVASC) 10 MG tablet; Take 1 tablet (10 mg total) by mouth daily.  Dispense: 30 tablet; Refill: 0 - Comprehensive metabolic panel  4. Depression, unspecified depression type Will reach out to LCSW for f/up - sertraline (ZOLOFT) 50 MG tablet; Take 1 tablet (50 mg total) by mouth daily.  Dispense: 30 tablet; Refill: 5  5. Grief reaction Self-care - sertraline (ZOLOFT) 50 MG tablet; Take 1 tablet (50 mg total) by mouth daily.  Dispense: 30 tablet; Refill: 5  6. Moderate persistent chronic asthma without complication - albuterol (PROVENTIL) (2.5 MG/3ML) 0.083% nebulizer solution; Take 3 mLs (2.5 mg total) by nebulization every 6 (six) hours as needed for wheezing or shortness of breath.  Dispense: 150 mL; Refill: 1 -  budesonide-formoterol (SYMBICORT) 80-4.5 MCG/ACT inhaler; Inhale 2 puffs into the lungs 2 (two) times daily.  Dispense: 1 each; Refill: 3 Nebulizer given  7. Pain of left lower extremity - traMADol (ULTRAM) 50 MG tablet; Take 1 tablet (50 mg total) by mouth every 6 (six) hours as needed.  Dispense: 15 tablet; Refill: 0  8. Alcohol abuse counseling and surveillance I have counseled the patient at length about substance abuse and addiction.  12 step meetings/recovery recommended.  Local 12 step meeting lists were given and attendance was encouraged.  Patient expresses understanding.  [place].org is the website for narcotics anonymous [place] (website) or ([phone]) [phone] is the information for alcoholics anonymous Both are free and immediately available for help with alcohol and drug use     Return for 3 weeks for BP;  3 months with  PCP.  The patient was given clear instructions to go to ER or return to medical center if symptoms don't improve, worsen or new problems develop. The patient verbalized understanding. The patient was told to call to get lab results if they haven't heard anything in the next week.      Freeman Caldron, PA-C [facility] and [facility], [place]   05/16/2022, 2:32 PM

## 2022-05-16 NOTE — Patient Instructions (Signed)
[place].org is the website for narcotics anonymous [place] (website) or [phone] is the information for alcoholics anonymous Both are free and immediately available for help with alcohol and drug use  

## 2022-05-17 ENCOUNTER — Other Ambulatory Visit: Payer: Self-pay | Admitting: Physician Assistant

## 2022-05-17 ENCOUNTER — Other Ambulatory Visit: Payer: Self-pay

## 2022-05-17 DIAGNOSIS — R7989 Other specified abnormal findings of blood chemistry: Secondary | ICD-10-CM

## 2022-05-17 LAB — COMPREHENSIVE METABOLIC PANEL
ALT: 13 IU/L (ref 0–32)
AST: 21 IU/L (ref 0–40)
Albumin/Globulin Ratio: 1.4 (ref 1.2–2.2)
Albumin: 4 g/dL (ref 3.8–4.9)
Alkaline Phosphatase: 161 IU/L — ABNORMAL HIGH (ref 44–121)
BUN/Creatinine Ratio: 16 (ref 9–23)
BUN: 27 mg/dL — ABNORMAL HIGH (ref 6–24)
Bilirubin Total: 0.2 mg/dL (ref 0.0–1.2)
CO2: 22 mmol/L (ref 20–29)
Calcium: 9.3 mg/dL (ref 8.7–10.2)
Chloride: 103 mmol/L (ref 96–106)
Creatinine, Ser: 1.65 mg/dL — ABNORMAL HIGH (ref 0.57–1.00)
Globulin, Total: 2.8 g/dL (ref 1.5–4.5)
Glucose: 93 mg/dL (ref 70–99)
Potassium: 4.3 mmol/L (ref 3.5–5.2)
Sodium: 142 mmol/L (ref 134–144)
Total Protein: 6.8 g/dL (ref 6.0–8.5)
eGFR: 37 mL/min/{1.73_m2} — ABNORMAL LOW (ref >59)

## 2022-05-17 LAB — HEMOGLOBIN A1C
Est. average glucose Bld gHb Est-mCnc: 111 mg/dL
Hgb A1c MFr Bld: 5.5 % (ref 4.8–5.6)

## 2022-05-22 ENCOUNTER — Other Ambulatory Visit: Payer: Self-pay

## 2022-05-23 ENCOUNTER — Other Ambulatory Visit: Payer: Self-pay

## 2022-05-25 ENCOUNTER — Other Ambulatory Visit: Payer: Self-pay | Admitting: Pharmacist

## 2022-05-25 ENCOUNTER — Other Ambulatory Visit: Payer: Self-pay

## 2022-05-25 MED ORDER — FLUTICASONE FUROATE-VILANTEROL 100-25 MCG/ACT IN AEPB
1.0000 | INHALATION_SPRAY | Freq: Every day | RESPIRATORY_TRACT | 2 refills | Status: AC
  Filled 2022-05-25: qty 60, 30d supply, fill #0

## 2022-05-29 ENCOUNTER — Ambulatory Visit: Payer: Self-pay | Attending: Internal Medicine

## 2022-05-29 ENCOUNTER — Other Ambulatory Visit (HOSPITAL_COMMUNITY)
Admission: RE | Admit: 2022-05-29 | Discharge: 2022-05-29 | Disposition: A | Discharge status: Home / Self Care | Payer: Self-pay | Source: Ambulatory Visit | Attending: Family Medicine | Admitting: Family Medicine

## 2022-05-29 ENCOUNTER — Telehealth: Payer: Self-pay

## 2022-05-29 VITALS — BP 139/96 | HR 86

## 2022-05-29 DIAGNOSIS — Z113 Encounter for screening for infections with a predominantly sexual mode of transmission: Secondary | ICD-10-CM | POA: Insufficient documentation

## 2022-05-29 DIAGNOSIS — R7989 Other specified abnormal findings of blood chemistry: Secondary | ICD-10-CM

## 2022-05-29 NOTE — Telephone Encounter (Signed)
Patient came in today for nurse visit and stated that she started taking her Zoloft today and is feeling very tired, stating that she doesn't like how the medication makes her feel.   Also she mentioned having black spot on her both hands and wanted to do swab as well, that was also done this visit.

## 2022-05-29 NOTE — Progress Notes (Signed)
Patient came in today for a 3 week blood pressure check.   Patient stated to having spots on her hands and wanted to be screened for any sexual infectious disease, a swab was collected.

## 2022-05-30 LAB — BASIC METABOLIC PANEL
BUN/Creatinine Ratio: 16 (ref 9–23)
BUN: 25 mg/dL — ABNORMAL HIGH (ref 6–24)
CO2: 21 mmol/L (ref 20–29)
Calcium: 9.7 mg/dL (ref 8.7–10.2)
Chloride: 102 mmol/L (ref 96–106)
Creatinine, Ser: 1.52 mg/dL — ABNORMAL HIGH (ref 0.57–1.00)
Glucose: 100 mg/dL — ABNORMAL HIGH (ref 70–99)
Potassium: 3.7 mmol/L (ref 3.5–5.2)
Sodium: 141 mmol/L (ref 134–144)
eGFR: 41 mL/min/{1.73_m2} — ABNORMAL LOW (ref >59)

## 2022-05-30 NOTE — Telephone Encounter (Signed)
She has not had a visit with me in 3 years.  Zoloft was prescribed by another provider.  She will need to schedule a visit with me to discuss.

## 2022-05-31 LAB — CERVICOVAGINAL ANCILLARY ONLY
Candida Glabrata: NEGATIVE
Candida Vaginitis: NEGATIVE
Chlamydia: NEGATIVE
Comment: NEGATIVE
Comment: NEGATIVE
Comment: NEGATIVE
Comment: NEGATIVE
Comment: NORMAL
Neisseria Gonorrhea: NEGATIVE
Trichomonas: NEGATIVE

## 2022-06-01 ENCOUNTER — Telehealth: Payer: Self-pay

## 2022-06-01 NOTE — Telephone Encounter (Signed)
Pt was called and is aware of results from recent visit, DOB was confirmed.

## 2022-06-01 NOTE — Telephone Encounter (Signed)
Called patient and she is aware of note. Verbalized understanding

## 2022-06-11 ENCOUNTER — Other Ambulatory Visit: Payer: Self-pay

## 2022-06-11 DIAGNOSIS — I10 Essential (primary) hypertension: Secondary | ICD-10-CM

## 2022-06-14 ENCOUNTER — Other Ambulatory Visit: Payer: Self-pay

## 2022-06-27 ENCOUNTER — Ambulatory Visit: Payer: Self-pay

## 2022-06-27 NOTE — Telephone Encounter (Signed)
  Chief Complaint: Abdominal pain Symptoms: Abdominal pain right side, back pain right side, nausea Frequency: 2 weeks Pertinent Negatives: Patient denies fever Disposition: '[]'$ ED /'[x]'$ Urgent Care (no appt availability in office) / '[]'$ Appointment(In office/virtual)/ '[]'$  [place] [facility]/ '[]'$ Home Care/ '[]'$ Refused Recommended Disposition /'[]'$ [place] [facility]/ '[x]'$  Follow-up with PCP Additional Notes: Pt states she has abdominal pain on her lower lower side. Pain is also in her right back. Pain comes and goes 3-4/10. Pt has had nausea a few times, and some loose stools. Pt thinks it may be her kidneys.   Reason for Disposition  [1] MODERATE pain (e.g., interferes with normal activities) AND [2] pain comes and goes (cramps) AND [3] present > 24 hours  (Exception: Pain with Vomiting or Diarrhea - see that Guideline.)  Answer Assessment - Initial Assessment Questions 1. LOCATION: "Where does it hurt?"      Right below belly button 2. RADIATION: "Does the pain shoot anywhere else?" (e.g., chest, back)     Around her back and down leg 3. ONSET: "When did the pain begin?" (e.g., minutes, hours or days ago)      Off and on for 2 weeks 4. SUDDEN: "Gradual or sudden onset?"      5. PATTERN "Does the pain come and go, or is it constant?"    - If it comes and goes: "How long does it last?" "Do you have pain now?"     (Note: Comes and goes means the pain is intermittent. It goes away completely between bouts.)    - If constant: "Is it getting better, staying the same, or getting worse?"      (Note: Constant means the pain never goes away completely; most serious pain is constant and gets worse.)      Intermittent 6. SEVERITY: "How bad is the pain?"  (e.g., Scale 1-10; mild, moderate, or severe)    - MILD (1-3): Doesn't interfere with normal activities, abdomen soft and not tender to touch.     - MODERATE (4-7): Interferes with normal activities or awakens from sleep, abdomen tender to touch.      - SEVERE (8-10): Excruciating pain, doubled over, unable to do any normal activities.       3/10 7. RECURRENT SYMPTOM: "Have you ever had this type of stomach pain before?" If Yes, ask: "When was the last time?" and "What happened that time?"      no 8. CAUSE: "What do you think is causing the stomach pain?"     Unsure - kidneys? 9. RELIEVING/AGGRAVATING FACTORS: "What makes it better or worse?" (e.g., antacids, bending or twisting motion, bowel movement)     no 10. OTHER SYMPTOMS: "Do you have any other symptoms?" (e.g., back pain, diarrhea, fever, urination pain, vomiting)       Back  pain right side.diarrhea a few days ago 11. PREGNANCY: "Is there any chance you are pregnant?" "When was your last menstrual period?"       na  Protocols used: Abdominal Pain - [facility]

## 2022-06-27 NOTE — Telephone Encounter (Signed)
Patient aware of options to take if pain worsens.  Will keep apt scheduled 07/10/2022

## 2022-06-28 ENCOUNTER — Other Ambulatory Visit: Payer: Self-pay

## 2022-07-09 ENCOUNTER — Other Ambulatory Visit: Payer: Self-pay

## 2022-07-10 ENCOUNTER — Ambulatory Visit: Payer: Self-pay | Attending: Family Medicine

## 2022-07-10 ENCOUNTER — Ambulatory Visit: Payer: Self-pay | Admitting: Critical Care Medicine

## 2022-07-10 DIAGNOSIS — I10 Essential (primary) hypertension: Secondary | ICD-10-CM

## 2022-07-11 LAB — COMPREHENSIVE METABOLIC PANEL
ALT: 13 IU/L (ref 0–32)
AST: 14 IU/L (ref 0–40)
Albumin/Globulin Ratio: 1.3 (ref 1.2–2.2)
Albumin: 3.6 g/dL — ABNORMAL LOW (ref 3.8–4.9)
Alkaline Phosphatase: 155 IU/L — ABNORMAL HIGH (ref 44–121)
BUN/Creatinine Ratio: 20 (ref 9–23)
BUN: 29 mg/dL — ABNORMAL HIGH (ref 6–24)
Bilirubin Total: <0.2 mg/dL (ref 0.0–1.2)
CO2: 24 mmol/L (ref 20–29)
Calcium: 8.8 mg/dL (ref 8.7–10.2)
Chloride: 106 mmol/L (ref 96–106)
Creatinine, Ser: 1.46 mg/dL — ABNORMAL HIGH (ref 0.57–1.00)
Globulin, Total: 2.8 g/dL (ref 1.5–4.5)
Glucose: 99 mg/dL (ref 70–99)
Potassium: 4.3 mmol/L (ref 3.5–5.2)
Sodium: 144 mmol/L (ref 134–144)
Total Protein: 6.4 g/dL (ref 6.0–8.5)
eGFR: 43 mL/min/{1.73_m2} — ABNORMAL LOW (ref >59)

## 2022-08-06 ENCOUNTER — Ambulatory Visit: Payer: Self-pay | Admitting: Family Medicine

## 2022-08-20 ENCOUNTER — Ambulatory Visit: Payer: Self-pay | Admitting: Nurse Practitioner

## 2022-10-04 ENCOUNTER — Emergency Department (HOSPITAL_COMMUNITY): Payer: Medicaid Other

## 2022-10-04 ENCOUNTER — Inpatient Hospital Stay (HOSPITAL_COMMUNITY)
Admission: EM | Admit: 2022-10-04 | Discharge: 2022-10-22 | DRG: 023 | Disposition: E | Payer: Medicaid Other | Attending: Neurology | Admitting: Neurology

## 2022-10-04 ENCOUNTER — Inpatient Hospital Stay (HOSPITAL_COMMUNITY): Payer: Medicaid Other

## 2022-10-04 ENCOUNTER — Encounter (HOSPITAL_COMMUNITY): Admission: EM | Disposition: E | Payer: Self-pay | Source: Home / Self Care | Attending: Neurology

## 2022-10-04 ENCOUNTER — Inpatient Hospital Stay (HOSPITAL_COMMUNITY): Payer: Medicaid Other | Admitting: Anesthesiology

## 2022-10-04 DIAGNOSIS — G936 Cerebral edema: Secondary | ICD-10-CM | POA: Diagnosis present

## 2022-10-04 DIAGNOSIS — Z66 Do not resuscitate: Secondary | ICD-10-CM | POA: Diagnosis present

## 2022-10-04 DIAGNOSIS — F1410 Cocaine abuse, uncomplicated: Secondary | ICD-10-CM | POA: Diagnosis present

## 2022-10-04 DIAGNOSIS — R197 Diarrhea, unspecified: Secondary | ICD-10-CM | POA: Diagnosis not present

## 2022-10-04 DIAGNOSIS — T80818A Extravasation of other vesicant agent, initial encounter: Secondary | ICD-10-CM | POA: Diagnosis present

## 2022-10-04 DIAGNOSIS — E785 Hyperlipidemia, unspecified: Secondary | ICD-10-CM | POA: Diagnosis present

## 2022-10-04 DIAGNOSIS — F17210 Nicotine dependence, cigarettes, uncomplicated: Secondary | ICD-10-CM | POA: Diagnosis present

## 2022-10-04 DIAGNOSIS — I130 Hypertensive heart and chronic kidney disease with heart failure and stage 1 through stage 4 chronic kidney disease, or unspecified chronic kidney disease: Secondary | ICD-10-CM | POA: Diagnosis present

## 2022-10-04 DIAGNOSIS — G929 Unspecified toxic encephalopathy: Secondary | ICD-10-CM | POA: Diagnosis present

## 2022-10-04 DIAGNOSIS — I639 Cerebral infarction, unspecified: Principal | ICD-10-CM

## 2022-10-04 DIAGNOSIS — J9601 Acute respiratory failure with hypoxia: Secondary | ICD-10-CM | POA: Diagnosis present

## 2022-10-04 DIAGNOSIS — R471 Dysarthria and anarthria: Secondary | ICD-10-CM | POA: Diagnosis present

## 2022-10-04 DIAGNOSIS — I5032 Chronic diastolic (congestive) heart failure: Secondary | ICD-10-CM | POA: Diagnosis present

## 2022-10-04 DIAGNOSIS — J9611 Chronic respiratory failure with hypoxia: Secondary | ICD-10-CM | POA: Diagnosis not present

## 2022-10-04 DIAGNOSIS — J45909 Unspecified asthma, uncomplicated: Secondary | ICD-10-CM | POA: Diagnosis present

## 2022-10-04 DIAGNOSIS — R29725 NIHSS score 25: Secondary | ICD-10-CM | POA: Diagnosis present

## 2022-10-04 DIAGNOSIS — Z7189 Other specified counseling: Secondary | ICD-10-CM | POA: Diagnosis not present

## 2022-10-04 DIAGNOSIS — I63511 Cerebral infarction due to unspecified occlusion or stenosis of right middle cerebral artery: Secondary | ICD-10-CM | POA: Diagnosis not present

## 2022-10-04 DIAGNOSIS — E870 Hyperosmolality and hypernatremia: Secondary | ICD-10-CM | POA: Diagnosis present

## 2022-10-04 DIAGNOSIS — J690 Pneumonitis due to inhalation of food and vomit: Secondary | ICD-10-CM | POA: Diagnosis not present

## 2022-10-04 DIAGNOSIS — N1330 Unspecified hydronephrosis: Secondary | ICD-10-CM | POA: Diagnosis not present

## 2022-10-04 DIAGNOSIS — Z6841 Body Mass Index (BMI) 40.0 and over, adult: Secondary | ICD-10-CM

## 2022-10-04 DIAGNOSIS — Z515 Encounter for palliative care: Secondary | ICD-10-CM

## 2022-10-04 DIAGNOSIS — N1831 Chronic kidney disease, stage 3a: Secondary | ICD-10-CM | POA: Diagnosis present

## 2022-10-04 DIAGNOSIS — I110 Hypertensive heart disease with heart failure: Secondary | ICD-10-CM

## 2022-10-04 DIAGNOSIS — I509 Heart failure, unspecified: Secondary | ICD-10-CM | POA: Diagnosis not present

## 2022-10-04 DIAGNOSIS — Z8249 Family history of ischemic heart disease and other diseases of the circulatory system: Secondary | ICD-10-CM

## 2022-10-04 DIAGNOSIS — Z91013 Allergy to seafood: Secondary | ICD-10-CM

## 2022-10-04 DIAGNOSIS — I69391 Dysphagia following cerebral infarction: Secondary | ICD-10-CM

## 2022-10-04 DIAGNOSIS — Z832 Family history of diseases of the blood and blood-forming organs and certain disorders involving the immune mechanism: Secondary | ICD-10-CM

## 2022-10-04 DIAGNOSIS — I63411 Cerebral infarction due to embolism of right middle cerebral artery: Principal | ICD-10-CM | POA: Diagnosis present

## 2022-10-04 DIAGNOSIS — Z79899 Other long term (current) drug therapy: Secondary | ICD-10-CM

## 2022-10-04 DIAGNOSIS — E1122 Type 2 diabetes mellitus with diabetic chronic kidney disease: Secondary | ICD-10-CM | POA: Diagnosis present

## 2022-10-04 DIAGNOSIS — I6389 Other cerebral infarction: Secondary | ICD-10-CM | POA: Diagnosis not present

## 2022-10-04 DIAGNOSIS — R1310 Dysphagia, unspecified: Secondary | ICD-10-CM | POA: Diagnosis present

## 2022-10-04 DIAGNOSIS — Z8673 Personal history of transient ischemic attack (TIA), and cerebral infarction without residual deficits: Secondary | ICD-10-CM | POA: Diagnosis not present

## 2022-10-04 DIAGNOSIS — J9602 Acute respiratory failure with hypercapnia: Secondary | ICD-10-CM | POA: Diagnosis not present

## 2022-10-04 DIAGNOSIS — Z7984 Long term (current) use of oral hypoglycemic drugs: Secondary | ICD-10-CM

## 2022-10-04 DIAGNOSIS — F32A Depression, unspecified: Secondary | ICD-10-CM | POA: Diagnosis present

## 2022-10-04 DIAGNOSIS — N179 Acute kidney failure, unspecified: Secondary | ICD-10-CM | POA: Diagnosis present

## 2022-10-04 DIAGNOSIS — Z91148 Patient's other noncompliance with medication regimen for other reason: Secondary | ICD-10-CM

## 2022-10-04 DIAGNOSIS — I69354 Hemiplegia and hemiparesis following cerebral infarction affecting left non-dominant side: Secondary | ICD-10-CM | POA: Diagnosis not present

## 2022-10-04 DIAGNOSIS — Z91030 Bee allergy status: Secondary | ICD-10-CM

## 2022-10-04 DIAGNOSIS — J9622 Acute and chronic respiratory failure with hypercapnia: Secondary | ICD-10-CM | POA: Diagnosis not present

## 2022-10-04 DIAGNOSIS — F1010 Alcohol abuse, uncomplicated: Secondary | ICD-10-CM | POA: Diagnosis present

## 2022-10-04 DIAGNOSIS — I609 Nontraumatic subarachnoid hemorrhage, unspecified: Secondary | ICD-10-CM | POA: Diagnosis not present

## 2022-10-04 DIAGNOSIS — Z9911 Dependence on respirator [ventilator] status: Secondary | ICD-10-CM | POA: Diagnosis not present

## 2022-10-04 DIAGNOSIS — D573 Sickle-cell trait: Secondary | ICD-10-CM | POA: Diagnosis present

## 2022-10-04 DIAGNOSIS — G935 Compression of brain: Secondary | ICD-10-CM | POA: Diagnosis present

## 2022-10-04 DIAGNOSIS — I160 Hypertensive urgency: Secondary | ICD-10-CM | POA: Diagnosis present

## 2022-10-04 DIAGNOSIS — E876 Hypokalemia: Secondary | ICD-10-CM | POA: Diagnosis present

## 2022-10-04 HISTORY — PX: IR PERCUTANEOUS ART THROMBECTOMY/INFUSION INTRACRANIAL INC DIAG ANGIO: IMG6087

## 2022-10-04 HISTORY — PX: RADIOLOGY WITH ANESTHESIA: SHX6223

## 2022-10-04 HISTORY — PX: IR CT HEAD LTD: IMG2386

## 2022-10-04 HISTORY — PX: IR US GUIDE VASC ACCESS RIGHT: IMG2390

## 2022-10-04 LAB — APTT: aPTT: 30 seconds (ref 24–36)

## 2022-10-04 LAB — PROTIME-INR
INR: 1.1 (ref 0.8–1.2)
Prothrombin Time: 14.1 seconds (ref 11.4–15.2)

## 2022-10-04 LAB — COMPREHENSIVE METABOLIC PANEL
ALT: 18 U/L (ref 0–44)
AST: 22 U/L (ref 15–41)
Albumin: 3.4 g/dL — ABNORMAL LOW (ref 3.5–5.0)
Alkaline Phosphatase: 126 U/L (ref 38–126)
Anion gap: 13 (ref 5–15)
BUN: 24 mg/dL — ABNORMAL HIGH (ref 6–20)
CO2: 19 mmol/L — ABNORMAL LOW (ref 22–32)
Calcium: 8.6 mg/dL — ABNORMAL LOW (ref 8.9–10.3)
Chloride: 106 mmol/L (ref 98–111)
Creatinine, Ser: 1.51 mg/dL — ABNORMAL HIGH (ref 0.44–1.00)
GFR, Estimated: 41 mL/min — ABNORMAL LOW (ref >60)
Glucose, Bld: 104 mg/dL — ABNORMAL HIGH (ref 70–99)
Potassium: 3.2 mmol/L — ABNORMAL LOW (ref 3.5–5.1)
Sodium: 138 mmol/L (ref 135–145)
Total Bilirubin: 0.5 mg/dL (ref 0.3–1.2)
Total Protein: 7.1 g/dL (ref 6.5–8.1)

## 2022-10-04 LAB — DIFFERENTIAL
Abs Immature Granulocytes: 0.02 10*3/uL (ref 0.00–0.07)
Basophils Absolute: 0 10*3/uL (ref 0.0–0.1)
Basophils Relative: 1 %
Eosinophils Absolute: 0.2 10*3/uL (ref 0.0–0.5)
Eosinophils Relative: 2 %
Immature Granulocytes: 0 %
Lymphocytes Relative: 44 %
Lymphs Abs: 3.9 10*3/uL (ref 0.7–4.0)
Monocytes Absolute: 0.8 10*3/uL (ref 0.1–1.0)
Monocytes Relative: 9 %
Neutro Abs: 3.8 10*3/uL (ref 1.7–7.7)
Neutrophils Relative %: 44 %

## 2022-10-04 LAB — CBC
HCT: 46.9 % — ABNORMAL HIGH (ref 36.0–46.0)
Hemoglobin: 16.5 g/dL — ABNORMAL HIGH (ref 12.0–15.0)
MCH: 30.7 pg (ref 26.0–34.0)
MCHC: 35.2 g/dL (ref 30.0–36.0)
MCV: 87.2 fL (ref 80.0–100.0)
Platelets: 178 10*3/uL (ref 150–400)
RBC: 5.38 MIL/uL — ABNORMAL HIGH (ref 3.87–5.11)
RDW: 13.5 % (ref 11.5–15.5)
WBC: 8.8 10*3/uL (ref 4.0–10.5)
nRBC: 0 % (ref 0.0–0.2)

## 2022-10-04 LAB — I-STAT CHEM 8, ED
BUN: 28 mg/dL — ABNORMAL HIGH (ref 6–20)
Calcium, Ion: 1.04 mmol/L — ABNORMAL LOW (ref 1.15–1.40)
Chloride: 106 mmol/L (ref 98–111)
Creatinine, Ser: 1.6 mg/dL — ABNORMAL HIGH (ref 0.44–1.00)
Glucose, Bld: 104 mg/dL — ABNORMAL HIGH (ref 70–99)
HCT: 50 % — ABNORMAL HIGH (ref 36.0–46.0)
Hemoglobin: 17 g/dL — ABNORMAL HIGH (ref 12.0–15.0)
Potassium: 3.2 mmol/L — ABNORMAL LOW (ref 3.5–5.1)
Sodium: 138 mmol/L (ref 135–145)
TCO2: 21 mmol/L — ABNORMAL LOW (ref 22–32)

## 2022-10-04 LAB — ETHANOL: Alcohol, Ethyl (B): <10 mg/dL (ref <10)

## 2022-10-04 LAB — GLUCOSE, CAPILLARY: Glucose-Capillary: 116 mg/dL — ABNORMAL HIGH (ref 70–99)

## 2022-10-04 LAB — BLOOD GAS, ARTERIAL
Acid-base deficit: 3.9 mmol/L — ABNORMAL HIGH (ref 0.0–2.0)
Bicarbonate: 21.6 mmol/L (ref 20.0–28.0)
O2 Saturation: 96.5 %
Patient temperature: 36.4
pCO2 arterial: 39 mmHg (ref 32–48)
pH, Arterial: 7.35 (ref 7.35–7.45)
pO2, Arterial: 76 mmHg — ABNORMAL LOW (ref 83–108)

## 2022-10-04 LAB — I-STAT BETA HCG BLOOD, ED (MC, WL, AP ONLY): I-stat hCG, quantitative: <5 m[IU]/mL (ref <5)

## 2022-10-04 LAB — CBG MONITORING, ED: Glucose-Capillary: 120 mg/dL — ABNORMAL HIGH (ref 70–99)

## 2022-10-04 SURGERY — IR WITH ANESTHESIA
Anesthesia: General

## 2022-10-04 MED ORDER — SENNOSIDES-DOCUSATE SODIUM 8.6-50 MG PO TABS: 1.0000 | ORAL_TABLET | Freq: Every evening | ORAL | Status: DC | PRN

## 2022-10-04 MED ORDER — IOHEXOL 300 MG/ML  SOLN
100.0000 mL | Freq: Once | INTRAMUSCULAR | Status: AC | PRN
  Administered 2022-10-04: 15 mL via INTRA_ARTERIAL

## 2022-10-04 MED ORDER — CLEVIDIPINE BUTYRATE 0.5 MG/ML IV EMUL
INTRAVENOUS | Status: DC | PRN
  Administered 2022-10-04: 4 mg/h via INTRAVENOUS

## 2022-10-04 MED ORDER — IOHEXOL 350 MG/ML SOLN
75.0000 mL | Freq: Once | INTRAVENOUS | Status: AC | PRN
  Administered 2022-10-04: 75 mL via INTRAVENOUS

## 2022-10-04 MED ORDER — VERAPAMIL HCL 2.5 MG/ML IV SOLN
INTRAVENOUS | Status: AC
  Filled 2022-10-04: qty 4

## 2022-10-04 MED ORDER — IOHEXOL 300 MG/ML  SOLN
100.0000 mL | Freq: Once | INTRAMUSCULAR | Status: AC | PRN
  Administered 2022-10-04: 60 mL via INTRA_ARTERIAL

## 2022-10-04 MED ORDER — ROCURONIUM BROMIDE 10 MG/ML (PF) SYRINGE
PREFILLED_SYRINGE | INTRAVENOUS | Status: DC | PRN
  Administered 2022-10-04: 70 mg via INTRAVENOUS

## 2022-10-04 MED ORDER — LIDOCAINE 2% (20 MG/ML) 5 ML SYRINGE
INTRAMUSCULAR | Status: DC | PRN
  Administered 2022-10-04: 100 mg via INTRAVENOUS

## 2022-10-04 MED ORDER — EPHEDRINE SULFATE-NACL 50-0.9 MG/10ML-% IV SOSY
PREFILLED_SYRINGE | INTRAVENOUS | Status: DC | PRN
  Administered 2022-10-04: 2.5 mg via INTRAVENOUS

## 2022-10-04 MED ORDER — ACETAMINOPHEN 650 MG RE SUPP: 650.0000 mg | RECTAL | Status: DC | PRN

## 2022-10-04 MED ORDER — KETAMINE HCL 10 MG/ML IJ SOLN: 0.3000 mg/kg | Freq: Once | INTRAMUSCULAR | Status: DC

## 2022-10-04 MED ORDER — PHENYLEPHRINE HCL-NACL 20-0.9 MG/250ML-% IV SOLN
INTRAVENOUS | Status: DC | PRN
  Administered 2022-10-04: 25 ug/min via INTRAVENOUS

## 2022-10-04 MED ORDER — LORAZEPAM 2 MG/ML IJ SOLN
INTRAMUSCULAR | Status: AC
  Administered 2022-10-04: 2 mg via INTRAVENOUS
  Filled 2022-10-04: qty 1

## 2022-10-04 MED ORDER — LORAZEPAM 2 MG/ML IJ SOLN: 2.0000 mg | Freq: Once | INTRAMUSCULAR | Status: AC

## 2022-10-04 MED ORDER — PROPOFOL 10 MG/ML IV BOLUS
INTRAVENOUS | Status: DC | PRN
  Administered 2022-10-04: 120 mg via INTRAVENOUS
  Administered 2022-10-04: 85 ug/kg/min via INTRAVENOUS

## 2022-10-04 MED ORDER — TENECTEPLASE FOR STROKE
25.0000 mg | PACK | Freq: Once | INTRAVENOUS | Status: AC
  Administered 2022-10-04: 25 mg via INTRAVENOUS
  Filled 2022-10-04: qty 10

## 2022-10-04 MED ORDER — KETAMINE HCL 50 MG/5ML IJ SOSY: 30.0000 mg | PREFILLED_SYRINGE | Freq: Once | INTRAMUSCULAR | Status: DC | PRN

## 2022-10-04 MED ORDER — ACETAMINOPHEN 160 MG/5ML PO SOLN: 650.0000 mg | ORAL | Status: DC | PRN

## 2022-10-04 MED ORDER — SUCCINYLCHOLINE 20MG/ML (10ML) SYRINGE FOR MEDFUSION PUMP - OPTIME
INTRAMUSCULAR | Status: DC | PRN
  Administered 2022-10-04: 140 mg via INTRAVENOUS

## 2022-10-04 MED ORDER — SODIUM CHLORIDE 0.9% FLUSH: 3.0000 mL | Freq: Once | INTRAVENOUS | Status: DC

## 2022-10-04 MED ORDER — PANTOPRAZOLE SODIUM 40 MG IV SOLR
40.0000 mg | Freq: Every day | INTRAVENOUS | Status: DC
  Administered 2022-10-05 – 2022-10-15 (×11): 40 mg via INTRAVENOUS
  Filled 2022-10-04 (×11): qty 10

## 2022-10-04 MED ORDER — STROKE: EARLY STAGES OF RECOVERY BOOK
Freq: Once | Status: AC
  Filled 2022-10-04: qty 1

## 2022-10-04 MED ORDER — ONDANSETRON HCL 4 MG/2ML IJ SOLN
INTRAMUSCULAR | Status: DC | PRN
  Administered 2022-10-04: 4 mg via INTRAVENOUS

## 2022-10-04 MED ORDER — SODIUM CHLORIDE 0.9 % IV SOLN: INTRAVENOUS | Status: DC | PRN

## 2022-10-04 MED ORDER — DEXAMETHASONE SODIUM PHOSPHATE 10 MG/ML IJ SOLN
INTRAMUSCULAR | Status: DC | PRN
  Administered 2022-10-04: 10 mg via INTRAVENOUS

## 2022-10-04 MED ORDER — FENTANYL CITRATE (PF) 100 MCG/2ML IJ SOLN
25.0000 ug | INTRAMUSCULAR | Status: DC | PRN
  Administered 2022-10-05 (×2): 50 ug via INTRAVENOUS

## 2022-10-04 MED ORDER — LORAZEPAM 2 MG/ML IJ SOLN
INTRAMUSCULAR | Status: AC
  Administered 2022-10-04: 1 mg via INTRAVENOUS
  Filled 2022-10-04: qty 1

## 2022-10-04 MED ORDER — PROMETHAZINE HCL 25 MG/ML IJ SOLN: 6.2500 mg | INTRAMUSCULAR | Status: DC | PRN

## 2022-10-04 MED ORDER — PHENYLEPHRINE 80 MCG/ML (10ML) SYRINGE FOR IV PUSH (FOR BLOOD PRESSURE SUPPORT)
PREFILLED_SYRINGE | INTRAVENOUS | Status: DC | PRN
  Administered 2022-10-04 (×4): 80 ug via INTRAVENOUS

## 2022-10-04 MED ORDER — ACETAMINOPHEN 325 MG PO TABS: 650.0000 mg | ORAL_TABLET | ORAL | Status: DC | PRN

## 2022-10-04 MED ORDER — LABETALOL HCL 5 MG/ML IV SOLN
20.0000 mg | Freq: Once | INTRAVENOUS | Status: AC
  Administered 2022-10-04: 20 mg via INTRAVENOUS

## 2022-10-04 MED ORDER — CLEVIDIPINE BUTYRATE 0.5 MG/ML IV EMUL
0.0000 mg/h | INTRAVENOUS | Status: DC
  Administered 2022-10-05: 8 mg/h via INTRAVENOUS
  Administered 2022-10-05 (×2): 4 mg/h via INTRAVENOUS
  Administered 2022-10-05: 2 mg/h via INTRAVENOUS
  Administered 2022-10-05: 4 mg/h via INTRAVENOUS
  Filled 2022-10-04 (×5): qty 50

## 2022-10-04 MED ORDER — VERAPAMIL HCL 2.5 MG/ML IV SOLN
INTRAVENOUS | Status: AC | PRN
  Administered 2022-10-04: 7 mg via INTRA_ARTERIAL

## 2022-10-04 MED ORDER — LORAZEPAM 2 MG/ML IJ SOLN: 1.0000 mg | Freq: Once | INTRAMUSCULAR | Status: AC

## 2022-10-04 MED ORDER — SODIUM CHLORIDE 0.9 % IV SOLN: INTRAVENOUS | Status: DC

## 2022-10-04 MED ORDER — PROPOFOL 1000 MG/100ML IV EMUL
INTRAVENOUS | Status: AC
  Filled 2022-10-04: qty 100

## 2022-10-04 NOTE — Anesthesia Postprocedure Evaluation (Signed)
Anesthesia Post Note  Patient: Taylor Vazquez  Procedure(s) Performed: IR WITH ANESTHESIA     Patient location during evaluation: PACU Anesthesia Type: General Level of consciousness: sedated and patient remains intubated per anesthesia plan Pain management: pain level controlled Vital Signs Assessment: post-procedure vital signs reviewed and stable Respiratory status: patient remains intubated per anesthesia plan Cardiovascular status: stable Anesthetic complications: no   No notable events documented.  Last Vitals:  Vitals:   10/15/2022 2330 09/27/2022 2345  BP: 133/86 (!) 140/92  Pulse:    Resp: 18 18  Temp:    SpO2:      Last Pain: There were no vitals filed for this visit.  LLE Motor Response: No movement to painful stimulus (10/11/2022 2345)   RLE Motor Response: No movement to painful stimulus (10/13/2022 2345)        Nolon Nations

## 2022-10-04 NOTE — Code Documentation (Signed)
Responded to Code Stroke called at 1925 for L sided paralysis, L facial droop, and R sided gaze, [id number]. Pt arrived at [place], CBG-92, NIH-19. CT scan delayed d/t combativeness. Pt given 90m ativan at 1946. CT head negative for acute changes. Decision made to given TNK at [place] Pt given 280mlabetalol at 1957 for BP-184/118. TNK given at 1958 after TNK BP parameters met. Pt given 74m32mtivan at 2002 prior to CTA for agitation. 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 IR paged out at 2016. Pt transported to IR at 2018.

## 2022-10-04 NOTE — Consult Note (Signed)
NAME:  Taylor Vazquez, MRN:  [id number], DOB:  09-02-1966, LOS: 0 ADMISSION DATE:  10/07/2022, CONSULTATION DATE:  09/23/2022 REFERRING MD: Rory Percy  , CHIEF COMPLAINT:  Code stroke   History of Present Illness:  Taylor Vazquez is a 56 y.o. F with PMH significant for asthma, depression, ETOH use, HTN, Sickle cell trait , HFpEF who presented to the ED after being found by her daughter with flaccid L side and R gaze deviation.  Her LKW was 1830 12/14 found at 1925.  She had a normal glucose and was brought to the ED as a code stroke.  She was initially hypertensive 200/100's.  The decision was made to administer TNK.  Imaging showed R MCA M2 occlusion and so interventional radiology consulted.  Pt was combative in the ED requiring ativan.  Labs were significant for K 3.2, Creatinine 1.6.    Mechanical thrombectomy was performed with TICI 2A with temporary recanalization, but then subsequent re-occlusion.  Repeat imaging with contrast extravasation and likely SAH.  She was left intubated and PCCM consulted.   Pertinent  Medical History   has a past medical history of Asthma, CHF (congestive heart failure) ([place]), Depression, Hypertension, Obesity, Sickle cell trait ([place]), and Stroke ([place]).   Significant Hospital Events: Including procedures, antibiotic start and stop dates in addition to other pertinent events   12/14 presented as a code stroke, R MCA M2 occlusion, taken to IR, possible SAH post-procedure and left intubated  Interim History / Subjective:    Objective   Blood pressure (!) 179/99, pulse 87, resp. rate 18, height '5\' 3"'$  (1.6 m), weight 102.6 kg, SpO2 93 %.        Intake/Output Summary (Last 24 hours) at 10/01/2022 2229 Last data filed at 09/29/2022 2213 Gross per 24 hour  Intake 700 ml  Output --  Net 700 ml   Filed Weights   10/16/2022 1900  Weight: 102.6 kg    General:  critically ill intubated and sedated F HEENT: MM pink/moist, pupils equal Neuro: examined on  sedation RASS -5 CV: s1s2 , no m/r/g PULM:  mechanically ventilated without rhonchi or wheezing GI: soft, bsx4 active  Extremities: warm/dry, no edema  Skin: no rashes or lesions   Resolved Hospital Problem list     Assessment & Plan:     Post-op mechanical ventilation management Acute R M2 LVO Possible post-procedure SAH S/p thrombectomy with TICI 2a recanalization and stent retriever and aspiration but with subsequent re-occlusion and concern for SAH, left intubated -Management per stroke team and neuro -MRI pending -close neuro monitoring  -PAD with propofol and fentanyl -cleviprex for SBP 120-140 -CXR to check ETT placement, ABG --Maintain full vent support with SAT/SBT as tolerated -titrate Vent setting to maintain SpO2 greater than or equal to 90%. -HOB elevated 30 degrees. -Plateau pressures less than 30 cm H20.  -Follow chest x-ray, ABG prn.   -Bronchial hygiene and RT/bronchodilator protocol. -echo    CKD stage 3a HTN Creatinine near baseline at 1.5 -monitor renal indices and avoid nephrotoxins -hold home HTN meds, on cleviprex    Type 2 DM -hold metformin, SSI  Hypokalemia  -replete K and check mag   Best Practice (right click and "Reselect all SmartList Selections" daily)   Diet/type: NPO DVT prophylaxis: SCD GI prophylaxis: PPI Lines: N/A Foley:  Yes, and it is still needed Code Status:  full code Last date of multidisciplinary goals of care discussion [per primary]  Labs   CBC: Recent Labs  Lab 10/03/2022  1945 10/06/2022 1946  WBC 8.8  --   NEUTROABS 3.8  --   HGB 16.5* 17.0*  HCT 46.9* 50.0*  MCV 87.2  --   PLT 178  --     Basic Metabolic Panel: Recent Labs  Lab 10/03/2022 1945 09/28/2022 1946  NA 138 138  K 3.2* 3.2*  CL 106 106  CO2 19*  --   GLUCOSE 104* 104*  BUN 24* 28*  CREATININE 1.51* 1.60*  CALCIUM 8.6*  --    GFR: Estimated Creatinine Clearance: 46 mL/min (A) (by C-G formula based on SCr of 1.6 mg/dL  (H)). Recent Labs  Lab 10/18/2022 1945  WBC 8.8    Liver Function Tests: Recent Labs  Lab 10/15/2022 1945  AST 22  ALT 18  ALKPHOS 126  BILITOT 0.5  PROT 7.1  ALBUMIN 3.4*   No results for input(s): "LIPASE", "AMYLASE" in the last 168 hours. No results for input(s): "AMMONIA" in the last 168 hours.  ABG    Component Value Date/Time   HCO3 25.4 (H) 09/29/2007 1819   TCO2 21 (L) 09/27/2022 1946   ACIDBASEDEF 1.0 09/29/2007 1819     Coagulation Profile: Recent Labs  Lab 10/03/2022 1945  INR 1.1    Cardiac Enzymes: No results for input(s): "CKTOTAL", "CKMB", "CKMBINDEX", "TROPONINI" in the last 168 hours.  HbA1C: Hemoglobin A1C  Date/Time Value Ref Range Status  02/02/2021 03:36 PM 5.3 4.0 - 5.6 % Final  07/24/2018 10:57 AM 5.8 (A) 4.0 - 5.6 % Final   HbA1c, POC (prediabetic range)  Date/Time Value Ref Range Status  07/24/2018 10:57 AM 5.8 5.7 - 6.4 % Final   HbA1c, POC (controlled diabetic range)  Date/Time Value Ref Range Status  12/31/2018 03:31 PM 5.4 0.0 - 7.0 % Final  07/24/2018 10:57 AM 5.8 0.0 - 7.0 % Final   HbA1c POC (<> result, manual entry)  Date/Time Value Ref Range Status  07/24/2018 10:57 AM 5.8 4.0 - 5.6 % Final   Hgb A1c MFr Bld  Date/Time Value Ref Range Status  05/16/2022 02:45 PM 5.5 4.8 - 5.6 % Final    Comment:             Prediabetes: 5.7 - 6.4          Diabetes: >6.4          Glycemic control for adults with diabetes: <7.0     CBG: Recent Labs  Lab 10/06/2022 1940 10/01/2022 2210  GLUCAP 120* 116*    Review of Systems:   Unable to obtain  Past Medical History:  She,  has a past medical history of Asthma, CHF (congestive heart failure) ([employer]), Depression, Hypertension, Obesity, Sickle cell trait ([place]), and Stroke ([place]).   Surgical History:   Past Surgical History:  Procedure Laterality Date   NO PAST SURGERIES       Social History:   reports that she has been smoking cigarettes. She has been smoking an average of .25  packs per day. She has never used smokeless tobacco. She reports that she does not currently use alcohol. She reports that she does not use drugs.   Family History:  Her family history includes Cancer in her father; Diabetes in her son and another family member; Hypertension in her father, mother, and another family member; Sickle cell trait in an other family member. There is no history of Other.   Allergies Allergies  Allergen Reactions   Bee Venom Anaphylaxis   Shellfish Allergy Anaphylaxis and Swelling  Home Medications  Prior to Admission medications   Medication Sig Start Date End Date Taking? Authorizing Provider  acetaminophen (TYLENOL) 325 MG tablet Take 2 tablets (650 mg total) by mouth every 6 (six) hours as needed for mild pain or moderate pain. 06/10/15   Kinnie Feil, MD  albuterol (PROVENTIL) (2.5 MG/3ML) 0.083% nebulizer solution Use 3 mLs (2.5 mg total) by nebulization every 6 (six) hours as needed for wheezing or shortness of breath. 05/16/22   Argentina Donovan, PA-C  albuterol (VENTOLIN HFA) 108 (90 Base) MCG/ACT inhaler INHALE 2 PUFFS INTO THE LUNGS EVERY 6 (SIX) HOURS AS NEEDED FOR WHEEZING OR SHORTNESS OF BREATH. 02/06/22   Gildardo Pounds, NP  amLODipine (NORVASC) 10 MG tablet Take 1 tablet (10 mg total) by mouth daily. 05/16/22   Argentina Donovan, PA-C  atorvastatin (LIPITOR) 20 MG tablet Take 1 tablet (20 mg total) by mouth daily. 05/16/22   Argentina Donovan, PA-C  fluticasone furoate-vilanterol (BREO ELLIPTA) 100-25 MCG/ACT AEPB Inhale 1 puff into the lungs daily. 05/25/22   Charlott Rakes, MD  metFORMIN (GLUCOPHAGE) 500 MG tablet Take 1 tablet (500 mg total) by mouth daily with breakfast. 05/16/22 06/24/22  Argentina Donovan, PA-C  ondansetron (ZOFRAN ODT) 4 MG disintegrating tablet Take 1 tablet (4 mg total) by mouth every 8 (eight) hours as needed for nausea or vomiting. 08/22/21   Mayers, Cari S, PA-C  pantoprazole (PROTONIX) 20 MG tablet Take 1 tablet (20 mg  total) by mouth daily. 02/06/22   Gildardo Pounds, NP  sertraline (ZOLOFT) 50 MG tablet Take 1 tablet (50 mg total) by mouth daily. 05/16/22   Argentina Donovan, PA-C  traMADol (ULTRAM) 50 MG tablet Take 1 tablet (50 mg total) by mouth every 6 (six) hours as needed. 05/16/22   Argentina Donovan, PA-C  traMADol (ULTRAM) 50 MG tablet Take 1 tablet by mouth every 6 hours  as needed. 05/16/22   Argentina Donovan, PA-C  TRUEplus Lancets 28G MISC USE AS DIRECTED TWICE DAILY 03/10/20   Charlott Rakes, MD     Critical care time: 42 minutes    CRITICAL CARE Performed by: Otilio Carpen Summerlynn Glauser   Total critical care time: 42 minutes  Critical care time was exclusive of separately billable procedures and treating other patients.  Critical care was necessary to treat or prevent imminent or life-threatening deterioration.  Critical care was time spent personally by me on the following activities: development of treatment plan with patient and/or surrogate as well as nursing, discussions with consultants, evaluation of patient's response to treatment, examination of patient, obtaining history from patient or surrogate, ordering and performing treatments and interventions, ordering and review of laboratory studies, ordering and review of radiographic studies, pulse oximetry and re-evaluation of patient's condition.   Otilio Carpen Quintasha Gren, PA-C [facility] See Amion for pager If no response to pager , please call [phone] until 7pm After 7:00 pm call Elink  [phone]?[place]

## 2022-10-04 NOTE — ED Provider Notes (Signed)
[facility] EMERGENCY DEPARTMENT Provider Note   CSN: [id number] Arrival date & time: 10/06/2022  3382  An emergency department physician performed an initial assessment on this suspected stroke patient at 38.  History  Chief Complaint  Patient presents with   Code Stroke    LKW 5053, code stroke called at 1925, patient found with left side flaccid and R sided gaze, w/ slurred speech , glucose 92, pt alert, states she has a HA    Taylor Vazquez is a 56 y.o. female.  Patient is a 56 year old female with a history of hypertension, asthma, obesity, prior stroke, CHF, regular alcohol use and tobacco use who is presenting today with EMS after an abrupt change of mental status and left-sided weakness that started at 6:30 PM tonight.  Patient's daughter reports she came home and had 3 12 ounce beers which is not unusual for her.  She was sitting talking with her daughter when suddenly at 630 she slid down and was unable to move her left side.  She started having slurred speech and some agitation.  EMS reports upon their arrival she was LVO positive and they initiated a code stroke.  Blood sugar was normal and patient's pressure was 976 systolic over 734.  Upon arrival here patient is patient is combative and fighting with staff.  Required 2 mg of Ativan to obtain scans.  Neurology was present immediately upon patient's arrival.  The history is provided by the patient. The history is limited by the condition of the patient.       Home Medications Prior to Admission medications   Medication Sig Start Date End Date Taking? Authorizing Provider  acetaminophen (TYLENOL) 325 MG tablet Take 2 tablets (650 mg total) by mouth every 6 (six) hours as needed for mild pain or moderate pain. 06/10/15   Kinnie Feil, MD  albuterol (PROVENTIL) (2.5 MG/3ML) 0.083% nebulizer solution Use 3 mLs (2.5 mg total) by nebulization every 6 (six) hours as needed for wheezing or shortness of  breath. 05/16/22   Argentina Donovan, PA-C  albuterol (VENTOLIN HFA) 108 (90 Base) MCG/ACT inhaler INHALE 2 PUFFS INTO THE LUNGS EVERY 6 (SIX) HOURS AS NEEDED FOR WHEEZING OR SHORTNESS OF BREATH. 02/06/22   Gildardo Pounds, NP  amLODipine (NORVASC) 10 MG tablet Take 1 tablet (10 mg total) by mouth daily. 05/16/22   Argentina Donovan, PA-C  atorvastatin (LIPITOR) 20 MG tablet Take 1 tablet (20 mg total) by mouth daily. 05/16/22   Argentina Donovan, PA-C  fluticasone furoate-vilanterol (BREO ELLIPTA) 100-25 MCG/ACT AEPB Inhale 1 puff into the lungs daily. 05/25/22   Charlott Rakes, MD  metFORMIN (GLUCOPHAGE) 500 MG tablet Take 1 tablet (500 mg total) by mouth daily with breakfast. 05/16/22 06/24/22  Argentina Donovan, PA-C  ondansetron (ZOFRAN ODT) 4 MG disintegrating tablet Take 1 tablet (4 mg total) by mouth every 8 (eight) hours as needed for nausea or vomiting. 08/22/21   Mayers, Cari S, PA-C  pantoprazole (PROTONIX) 20 MG tablet Take 1 tablet (20 mg total) by mouth daily. 02/06/22   Gildardo Pounds, NP  sertraline (ZOLOFT) 50 MG tablet Take 1 tablet (50 mg total) by mouth daily. 05/16/22   Argentina Donovan, PA-C  traMADol (ULTRAM) 50 MG tablet Take 1 tablet (50 mg total) by mouth every 6 (six) hours as needed. 05/16/22   Argentina Donovan, PA-C  traMADol (ULTRAM) 50 MG tablet Take 1 tablet by mouth every 6 hours  as needed.  05/16/22   Argentina Donovan, PA-C  TRUEplus Lancets 28G MISC USE AS DIRECTED TWICE DAILY 03/10/20   Charlott Rakes, MD      Allergies    Bee venom and Shellfish allergy    Review of Systems   Review of Systems  Physical Exam Updated Vital Signs BP (!) 179/99 (BP Location: Left Leg)   Pulse 87   Resp 18   Ht '5\' 3"'$  (1.6 m)   Wt 102.6 kg   SpO2 93%   BMI 40.07 kg/m  Physical Exam Vitals and nursing note reviewed.  Constitutional:      General: She is in acute distress.     Appearance: She is well-developed.  HENT:     Head: Normocephalic and atraumatic.  Eyes:      Pupils: Pupils are equal, round, and reactive to light.  Cardiovascular:     Rate and Rhythm: Normal rate and regular rhythm.     Heart sounds: Normal heart sounds. No murmur heard.    No friction rub.  Pulmonary:     Effort: Pulmonary effort is normal.     Breath sounds: Normal breath sounds. No wheezing or rales.  Abdominal:     General: Bowel sounds are normal. There is no distension.     Palpations: Abdomen is soft.     Tenderness: There is no abdominal tenderness. There is no guarding or rebound.  Musculoskeletal:        General: No tenderness. Normal range of motion.     Comments: No edema  Skin:    General: Skin is warm and dry.     Findings: No rash.  Neurological:     Mental Status: She is alert.     Cranial Nerves: Dysarthria and facial asymmetry present. No cranial nerve deficit.     Sensory: Sensory deficit present.     Motor: Weakness present.     Comments: Patient has flaccid paralysis of the left upper and lower extremity, gaze preference to the right, dysarthria and word salad.  Psychiatric:        Behavior: Behavior normal.     ED Results / Procedures / Treatments   Labs (all labs ordered are listed, but only abnormal results are displayed) Labs Reviewed  CBC - Abnormal; Notable for the following components:      Result Value   RBC 5.38 (*)    Hemoglobin 16.5 (*)    HCT 46.9 (*)    All other components within normal limits  COMPREHENSIVE METABOLIC PANEL - Abnormal; Notable for the following components:   Potassium 3.2 (*)    CO2 19 (*)    Glucose, Bld 104 (*)    BUN 24 (*)    Creatinine, Ser 1.51 (*)    Calcium 8.6 (*)    Albumin 3.4 (*)    GFR, Estimated 41 (*)    All other components within normal limits  I-STAT CHEM 8, ED - Abnormal; Notable for the following components:   Potassium 3.2 (*)    BUN 28 (*)    Creatinine, Ser 1.60 (*)    Glucose, Bld 104 (*)    Calcium, Ion 1.04 (*)    TCO2 21 (*)    Hemoglobin 17.0 (*)    HCT 50.0 (*)    All  other components within normal limits  CBG MONITORING, ED - Abnormal; Notable for the following components:   Glucose-Capillary 120 (*)    All other components within normal limits  PROTIME-INR  APTT  DIFFERENTIAL  ETHANOL  HIV ANTIBODY (ROUTINE TESTING W REFLEX)  LIPID PANEL  I-STAT BETA HCG BLOOD, ED ([facility], [facility], [facility] ONLY)    EKG None  Radiology No results found.  Procedures Procedures    Medications Ordered in ED Medications  sodium chloride flush (NS) 0.9 % injection 3 mL (has no administration in time range)  ketamine 50 mg in normal saline 5 mL (10 mg/mL) syringe (has no administration in time range)   stroke: early stages of recovery book (has no administration in time range)  0.9 %  sodium chloride infusion ( Intravenous New Bag/Given 10/20/2022 2027)  acetaminophen (TYLENOL) tablet 650 mg (has no administration in time range)    Or  acetaminophen (TYLENOL) 160 MG/5ML solution 650 mg (has no administration in time range)    Or  acetaminophen (TYLENOL) suppository 650 mg (has no administration in time range)  senna-docusate (Senokot-S) tablet 1 tablet (has no administration in time range)  pantoprazole (PROTONIX) injection 40 mg (has no administration in time range)  tenecteplase (TNKASE) injection for Stroke 25 mg (25 mg Intravenous Given 10/02/2022 1958)  iohexol (OMNIPAQUE) 350 MG/ML injection 75 mL (75 mLs Intravenous Contrast Given 10/21/2022 2003)  labetalol (NORMODYNE) injection 20 mg (20 mg Intravenous Given 09/25/2022 1957)  LORazepam (ATIVAN) injection 2 mg (2 mg Intravenous Given 10/02/2022 1946)  LORazepam (ATIVAN) injection 1 mg (1 mg Intravenous Given 09/27/2022 2002)    ED Course/ Medical Decision Making/ A&P                           Medical Decision Making Risk Prescription drug management. Decision regarding hospitalization.   Pt with multiple medical problems and comorbidities and presenting today with a complaint that caries a high risk for morbidity and  mortality.  Patient here today as a code stroke.  Last seen normal by her daughter at 40 when she suddenly developed left-sided weakness and right word gaze.  Patient is LVO positive upon arrival here.  She was agitated and required 2 mg of Ativan.  Patient was noted to have drink some alcohol today but based on family that is not unusual for her.  She is hypertensive as well at 696/789 systolic.  She does not take any anticoagulation.  I have independently visualized and interpreted pt's images today.  Head CT today without evidence of mass or bleed.  With blood pressure control she would be a TNKase candidate and also given her LVO status we will get a CTA and may need IR.  She is currently maintaining her airway.  Sats are normal and she is [place] now after the Ativan.  I independently interpreted patient's labs and i-STAT showed a hypokalemia of 3.2, creatinine of 1.6 and CBC which is normal.  Neurology is present throughout evaluation.  They are speaking with the patient's daughter for consent to treat. Blood pressure improved and pt went to IR.  Airway stable at this time.  CRITICAL CARE Performed by: Lamere Lightner Total critical care time: 30 minutes Critical care time was exclusive of separately billable procedures and treating other patients. Critical care was necessary to treat or prevent imminent or life-threatening deterioration. Critical care was time spent personally by me on the following activities: development of treatment plan with patient and/or surrogate as well as nursing, discussions with consultants, evaluation of patient's response to treatment, examination of patient, obtaining history from patient or surrogate, ordering and performing treatments and interventions, ordering and review of laboratory studies, ordering  and review of radiographic studies, pulse oximetry and re-evaluation of patient's condition.           Final Clinical Impression(s) / ED Diagnoses Final  diagnoses:  Cerebrovascular accident (CVA), unspecified mechanism [facility])    Rx / DC Orders ED Discharge Orders     None         Blanchie Dessert, MD 10/01/2022 2057

## 2022-10-04 NOTE — Progress Notes (Signed)
PHARMACIST CODE STROKE RESPONSE  Notified to mix TNK at 19:55 by Dr. Rory Percy TNK preparation completed at 19:57  TNK dose = 25 mg IV over 5 seconds.   Issues/delays encountered (if applicable): CT scanning was delayed due to significant agitation and inability to lay still on scanner. Pt received '2mg'$  IV Ativan and was able to relax for CT scan. BP was elevated and labetalol '20mg'$  IV x1 was given. TNK was administered at 19:58.   Luisa Hart, PharmD, BCPS Clinical Pharmacist 09/29/2022 8:19 PM

## 2022-10-04 NOTE — Transfer of Care (Signed)
Immediate Anesthesia Transfer of Care Note  Patient: Taylor Vazquez  Procedure(s) Performed: IR WITH ANESTHESIA  Patient Location: PACU  Anesthesia Type:General  Level of Consciousness: Patient remains intubated per anesthesia plan  Airway & Oxygen Therapy: Patient remains intubated per anesthesia plan and Patient placed on Ventilator (see vital sign flow sheet for setting)  Post-op Assessment: Report given to RN and Post -op Vital signs reviewed and stable  Post vital signs: Reviewed and stable  Last Vitals:  Vitals Value Taken Time  BP 129/84   Temp    Pulse 62   Resp 16   SpO2 100     Last Pain: There were no vitals filed for this visit.       Complications: No notable events documented.

## 2022-10-04 NOTE — Anesthesia Procedure Notes (Addendum)
Procedure Name: Intubation Date/Time: 09/30/2022 8:44 PM  Performed by: Rande Brunt, CRNAPre-anesthesia Checklist: Patient identified, Emergency Drugs available, Suction available and Patient being monitored Patient Re-evaluated:Patient Re-evaluated prior to induction Oxygen Delivery Method: Circle System Utilized Preoxygenation: Pre-oxygenation with 100% oxygen Induction Type: IV induction, Rapid sequence and Cricoid Pressure applied Laryngoscope Size: Mac and 3 Grade View: Grade I Tube type: Oral Tube size: 7.5 mm Number of attempts: 1 Airway Equipment and Method: Stylet Placement Confirmation: ETT inserted through vocal cords under direct vision, positive ETCO2 and breath sounds checked- equal and bilateral Secured at: 21 cm Tube secured with: Tape Dental Injury: Teeth and Oropharynx as per pre-operative assessment

## 2022-10-04 NOTE — Progress Notes (Signed)
Post IR-flat panel CT with intense contrast dosing and edema in the right frontal and anterior temporal cortex as well as basal ganglia suggesting of ongoing ischemia along with possible bleeding in the right sylvian fissure along with subarachnoid. Unsuccessful final revascularization. Remains intubated with poor neurological exam at this time complicated by sedation.  Recommendations: Blood pressure goal-systolic 223-361. Repeat head CT in 6 hours Vent management per CCM Stroke team to continue to follow.   -- Amie Portland, MD Neurologist [facility] Pager: [phone]  Additional 25 min CC time in patient care, discussion of results with specialists, datat review and management.

## 2022-10-04 NOTE — ED Notes (Signed)
Patient belongings given to family member as follows: Two rings, A pair of hoop earrings, underwear, pants, shirt

## 2022-10-04 NOTE — Anesthesia Preprocedure Evaluation (Addendum)
Anesthesia Evaluation  Patient identified by MRN, date of birth, ID band  Reviewed: Allergy & Precautions, Patient's Chart, lab work & pertinent test results, Unable to perform ROS - Chart review onlyPreop documentation limited or incomplete due to emergent nature of procedure.  Airway Mallampati: II  TM Distance: >3 FB Neck ROM: Full    Dental  (+) Poor Dentition, Dental Advisory Given, Missing   Pulmonary shortness of breath, asthma , COPD, Current Smoker   Pulmonary exam normal breath sounds clear to auscultation       Cardiovascular hypertension, Pt. on medications +CHF   Rhythm:Regular Rate:Normal  Echo 2018 - Left ventricle: The cavity size was normal. Wall thickness was increased in a pattern of moderate LVH. Systolic function was vigorous. The estimated ejection fraction was in the range of 65% to 70%. Wall motion was normal; there were no regional wall motion abnormalities. Doppler parameters are consistent with abnormal left ventricular relaxation (grade 1 diastolic  dysfunction).   Impressions:   - Vigorous LV systolic function; mild diastolic dysfunction;   moderate LVH.     Neuro/Psych  Headaches PSYCHIATRIC DISORDERS  Depression    CVA    GI/Hepatic negative GI ROS, Neg liver ROS,,,  Endo/Other    Morbid obesity  Renal/GU negative Renal ROS     Musculoskeletal negative musculoskeletal ROS (+)    Abdominal  (+) + obese  Peds  Hematology negative hematology ROS (+)   Anesthesia Other Findings   Reproductive/Obstetrics                             Anesthesia Physical Anesthesia Plan  ASA: 3 and emergent  Anesthesia Plan: General   Post-op Pain Management: Minimal or no pain anticipated   Induction: Intravenous  PONV Risk Score and Plan: 2 and Ondansetron, Treatment may vary due to age or medical condition and Dexamethasone  Airway Management Planned: Oral ETT  Additional  Equipment:   Intra-op Plan:   Post-operative Plan: Post-operative intubation/ventilation  Informed Consent: I have reviewed the patients History and Physical, chart, labs and discussed the procedure including the risks, benefits and alternatives for the proposed anesthesia with the patient or authorized representative who has indicated his/her understanding and acceptance.     Dental advisory given  Plan Discussed with: CRNA  Anesthesia Plan Comments:        Anesthesia Quick Evaluation

## 2022-10-04 NOTE — Procedures (Signed)
INTERVENTIONAL NEURORADIOLOGY BRIEF POSTPROCEDURE NOTE  DIAGNOSTIC CEREBRAL ANGIOGRAM AND MECHANICAL THROMBECTOMY  Attending: Dr. Pedro Earls  Diagnosis: Proximal right M2/MCA occlusion  Access site: RCFA  Access closure: Perclose Prostyle  Anesthesia: General anesthesia  Medication used: refer to anesthesia documentation.  Complications: Small right sylvian SAH/contrast extravasation.  Estimated blood loss: 60 mL  Specimen: None.  Findings: Occlusion of a proximal dominant M2/MCA posterior division branch with tapering of the occluded branch. Mechanical thrombectomy performed with direct contact aspiration (x2) and combine stent retriever and aspiration (x1) achieving temporary recanalization with subsequent reocclusion (TICI 2A).  Flat panel head CT showed intense contrast blushing and edema of the right frontal and anterior temporal cortex and basal ganglia, suggesting ongoing ischemia . A small right Sylvian hyperdensity is also seen, suggesting contrast extravasation with likely associated SAH. At this point, no further intervention performed.  The patient remained intubated and was transferred to PACU in stable condition.    PLAN: - Bed rest x 6 hours s/p femoral artery access - SBP 120-140 mmHg

## 2022-10-04 NOTE — H&P (Signed)
Stroke Neurology Admission History & Physical   CC: Left-sided weakness, LVO positive code stroke  History is obtained from: Chart, patient's daughter  HPI: Taylor Vazquez is a 56 y.o. female past medical history of CHF, hypertension, obesity, sickle cell trait, prior stroke, cocaine use, presenting for evaluation of sudden onset of left-sided weakness with weakness last known well at 6 PM.  According to family, she was with a family member and was having normal conversation and 6 and around 6:30 PM, noted to have left-sided weakness and gaze to the right.  EMS was called.  They activated a LVO positive code stroke.  Family unsure if she has been using illicit drugs.  She does have a history of alcohol abuse as well.  Patient unable to provide any history as she is very combative and agitated.  She required a total of 3 mg of Ativan to complete head imaging.  Her systolic blood pressures were also noted to be in the 280 range by EMS.    LKW: 1800 hrs. IV thrombolysis given?:  Yes-after discussion with the daughter over the phone Premorbid modified Rankin scale (mRS):1 Interventionalist contacted 2009 hrs.   ROS: Full ROS was performed and is negative except as noted in the HPI.   Past Medical History:  Diagnosis Date   Asthma    CHF (congestive heart failure) (HCC)    Depression    Hypertension    Obesity    Sickle cell trait ([place])    Stroke ([place])      Family History  Problem Relation Age of Onset   Hypertension Mother    Hypertension Father    Cancer Father    Hypertension Other    Sickle cell trait Other    Diabetes Other    Diabetes Son    Other Neg Hx      Social History:   reports that she has been smoking cigarettes. She has been smoking an average of .25 packs per day. She has never used smokeless tobacco. She reports that she does not currently use alcohol. She reports that she does not use drugs.  Medications  Current Facility-Administered Medications:     LORazepam (ATIVAN) 2 MG/ML injection, , , ,    [START ON 10/05/2022]  stroke: early stages of recovery book, , Does not apply, Once, Amie Portland, MD   0.9 %  sodium chloride infusion, , Intravenous, Continuous, Amie Portland, MD   acetaminophen (TYLENOL) tablet 650 mg, 650 mg, Oral, Q4H PRN **OR** acetaminophen (TYLENOL) 160 MG/5ML solution 650 mg, 650 mg, Per Tube, Q4H PRN **OR** acetaminophen (TYLENOL) suppository 650 mg, 650 mg, Rectal, Q4H PRN, Amie Portland, MD   albuterol (PROVENTIL) (2.5 MG/3ML) 0.083% nebulizer solution 2.5 mg, 2.5 mg, Nebulization, Q4H PRN, Newlin, Enobong, MD   ketamine 50 mg in normal saline 5 mL (10 mg/mL) syringe, 30 mg, Intravenous, Once PRN, Maryan Rued, Whitney, MD   LORazepam (ATIVAN) 2 MG/ML injection, , , ,    pantoprazole (PROTONIX) injection 40 mg, 40 mg, Intravenous, QHS, Amie Portland, MD   senna-docusate (Senokot-S) tablet 1 tablet, 1 tablet, Oral, QHS PRN, Amie Portland, MD   sodium chloride flush (NS) 0.9 % injection 3 mL, 3 mL, Intravenous, Once, Tegeler, Gwenyth Allegra, MD   tenecteplase [facility]) injection for Stroke 25 mg, 25 mg, Intravenous, Once, Amie Portland, MD  Current Outpatient Medications:    acetaminophen (TYLENOL) 325 MG tablet, Take 2 tablets (650 mg total) by mouth every 6 (six) hours as needed for mild pain  or moderate pain., Disp: , Rfl:    albuterol (PROVENTIL) (2.5 MG/3ML) 0.083% nebulizer solution, Use 3 mLs (2.5 mg total) by nebulization every 6 (six) hours as needed for wheezing or shortness of breath., Disp: 150 mL, Rfl: 1   albuterol (VENTOLIN HFA) 108 (90 Base) MCG/ACT inhaler, INHALE 2 PUFFS INTO THE LUNGS EVERY 6 (SIX) HOURS AS NEEDED FOR WHEEZING OR SHORTNESS OF BREATH., Disp: 18 g, Rfl: 1   amLODipine (NORVASC) 10 MG tablet, Take 1 tablet (10 mg total) by mouth daily., Disp: 30 tablet, Rfl: 0   atorvastatin (LIPITOR) 20 MG tablet, Take 1 tablet (20 mg total) by mouth daily., Disp: 30 tablet, Rfl: 1   fluticasone  furoate-vilanterol (BREO ELLIPTA) 100-25 MCG/ACT AEPB, Inhale 1 puff into the lungs daily., Disp: 60 each, Rfl: 2   metFORMIN (GLUCOPHAGE) 500 MG tablet, Take 1 tablet (500 mg total) by mouth daily with breakfast., Disp: 30 tablet, Rfl: 0   ondansetron (ZOFRAN ODT) 4 MG disintegrating tablet, Take 1 tablet (4 mg total) by mouth every 8 (eight) hours as needed for nausea or vomiting., Disp: 20 tablet, Rfl: 0   pantoprazole (PROTONIX) 20 MG tablet, Take 1 tablet (20 mg total) by mouth daily., Disp: 30 tablet, Rfl: 0   sertraline (ZOLOFT) 50 MG tablet, Take 1 tablet (50 mg total) by mouth daily., Disp: 30 tablet, Rfl: 5   traMADol (ULTRAM) 50 MG tablet, Take 1 tablet (50 mg total) by mouth every 6 (six) hours as needed., Disp: 15 tablet, Rfl: 0   traMADol (ULTRAM) 50 MG tablet, Take 1 tablet by mouth every 6 hours  as needed., Disp: 15 tablet, Rfl: 0   TRUEplus Lancets 28G MISC, USE AS DIRECTED TWICE DAILY, Disp: 100 each, Rfl: 0   Exam: Current vital signs: BP 113/73   Pulse (!) 54   Temp (!) 96 F (35.6 C)   Resp 18   Ht '5\' 3"'$  (1.6 m)   Wt 102.6 kg   SpO2 100%   BMI 40.07 kg/m  Vital signs in last 24 hours: Temp:  [96 F (35.6 C)] 96 F (35.6 C) (12/14 2210) Pulse Rate:  [54-87] 54 (12/14 2238) Resp:  [18-24] 18 (12/14 2230) BP: (73-184)/(32-118) 113/73 (12/14 2230) SpO2:  [90 %-100 %] 100 % (12/14 2238) FiO2 (%):  [60 %-100 %] 60 % (12/14 2238) Weight:  [102.6 kg] 102.6 kg (12/14 1900) General: Awake, alert, very combative HEENT: Normocephalic atraumatic Lungs: Clear Cardiovascular: Regular rhythm Abdomen nondistended nontender Neurological exam She is awake alert extremely combative Her speech is extremely dysarthric She does not seem to be following commands Cranial nerves: Pupils equal round react light, forced gaze to the right, does not blink to threat from the left, left lower facial weakness evident at rest and on her talking. Motor examination with flaccid left upper  and lower extremity.  Full strength right upper and lower extremity. Sensation diminished to noxious simulation on the left.  Also extinguishes left side to noxious double simultaneous stimulation. Coordination cannot be assessed given her inability to follow commands NIHSS 1a Level of Conscious.: 0 1b LOC Questions: 2 1c LOC Commands: 2 2 Best Gaze: 2 3 Visual: 2 4 Facial Palsy: 2 5a Motor Arm - left: 4 5b Motor Arm - Right: 0 6a Motor Leg - Left: 4 6b Motor Leg - Right: 0 7 Limb Ataxia: 0 8 Sensory: 2 9 Best Language: 1 10 Dysarthria: 2 11 Extinct. and Inatten.: 2 TOTAL: 25   Labs I have reviewed  labs in epic and the results pertinent to this consultation are: CBC    Component Value Date/Time   WBC 8.8 09/24/2022 1945   RBC 5.38 (H) 10/06/2022 1945   HGB 17.0 (H) 09/21/2022 1946   HGB 13.8 06/18/2018 1542   HCT 50.0 (H) 10/07/2022 1946   HCT 41.3 06/18/2018 1542   PLT 178 10/02/2022 1945   PLT 241 06/18/2018 1542   MCV 87.2 09/27/2022 1945   MCV 82 06/18/2018 1542   MCV 86 07/06/2014 1514   MCH 30.7 10/07/2022 1945   MCHC 35.2 09/23/2022 1945   RDW 13.5 09/27/2022 1945   RDW 15.7 (H) 06/18/2018 1542   RDW 15.0 (H) 07/06/2014 1514   LYMPHSABS 3.9 09/27/2022 1945   LYMPHSABS 2.8 06/18/2018 1542   LYMPHSABS 2.8 06/14/2014 1214   MONOABS 0.8 10/07/2022 1945   MONOABS 0.9 06/14/2014 1214   EOSABS 0.2 09/28/2022 1945   EOSABS 0.2 06/18/2018 1542   EOSABS 0.2 06/14/2014 1214   BASOSABS 0.0 10/08/2022 1945   BASOSABS 0.0 06/18/2018 1542   BASOSABS 0.1 06/14/2014 1214    CMP     Component Value Date/Time   NA 138 10/16/2022 1946   NA 144 07/10/2022 1108   NA 141 07/06/2014 1514   K 3.2 (L) 10/06/2022 1946   K 3.5 07/06/2014 1514   CL 106 10/03/2022 1946   CL 106 07/06/2014 1514   CO2 24 07/10/2022 1108   CO2 24 07/06/2014 1514   GLUCOSE 104 (H) 10/02/2022 1946   GLUCOSE 96 07/06/2014 1514   BUN 28 (H) 09/21/2022 1946   BUN 29 (H) 07/10/2022 1108   BUN  14 07/06/2014 1514   CREATININE 1.60 (H) 10/09/2022 1946   CREATININE 0.88 03/13/2016 1216   CALCIUM 8.8 07/10/2022 1108   CALCIUM 8.1 (L) 07/06/2014 1514   PROT 6.4 07/10/2022 1108   ALBUMIN 3.6 (L) 07/10/2022 1108   AST 14 07/10/2022 1108   ALT 13 07/10/2022 1108   ALKPHOS 155 (H) 07/10/2022 1108   BILITOT <0.2 07/10/2022 1108   GFRNONAA 56 (L) 08/15/2021 0629   GFRNONAA >60 07/06/2014 1514   GFRAA >60 12/11/2018 1211   GFRAA >60 07/06/2014 1514   Imaging I have reviewed the images obtained:  CT-head: Aspects 8-9, concerning for right MCA stroke.  No bleed. CT angiography head and neck with right proximal M2 occlusion, poor lateralization.   Assessment:  56 year old with above past medical history with sudden onset of left-sided weakness, rightward gaze dysarthria and altered mental status. Examination consistent with right hemispheric stroke. CT head with concern for stroke in the right MCA territory with early changes in aspects 8-9.  CT angiography with right proximal M2 occlusion with poor collateralization Risk benefits alternatives of IV TNKase discussed with the family after the CT head obtained and clinical exam performed.  Daughter agreed to proceed with IV TNKase which was given. Case discussed with endovascular interventionalist after CT angiography findings. Delay in activation of IR code due to control of agitation that required multiple doses of Ativan and blood pressure management for TNK administration.    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 -Hyperglycemia management per SSI to maintain glucose 140-'180mg'$ /dL. -PT/OT/ST therapies and recommendations when able  CNS -Close neuro monitoring  Dysarthria Dysphagia following cerebral infarction  -NPO until cleared by speech -ST -Advance diet as tolerated  Hemiplegia and hemiparesis following cerebral infarction affecting left non-dominant side  -PT/OT -PM&R consult  Toxic encephalopathy -Given history of polysubstance abuse, check UDS and ethanol level  RESP Ventilated for procedure - Wean as tolerated by anesthesia - If unable to wean, will consult PCCM for management in the ICU  CV Hypertensive urgency - Aggressive blood pressure control with systolic blood pressure goal less than 180 post TNK. - If revascularization is successful, systolic blood pressure goal would be between 120-140.  Heart failure, unspecified -TTE  Hyperlipidemia, unspecified  - Statin for goal LDL < 70   HEME No active issue Check labs in the morning   ENDO Check A1c Goal A1c less than 7  GI/GU Appears to have CKD 3 Gentle hydration Avoid nephrotoxic drugs   Fluid/Electrolyte Disorders Hypokalemia - Replete - Repeat labs in the morning and replete as necessary again  ID Possible Aspiration PNA -CXR -NPO -Monitor  Nutrition E66.9 Obesity  -diet consult  Prophylaxis DVT: SCD GI: PPI Bowel: Doc senna  Diet: NPO until cleared by speech  Code Status: Full Code   THE FOLLOWING WERE PRESENT ON ADMISSION: Acute ischemic stroke History of drug abuse-unclear if used cocaine prior to presentation. Morbid obesity Possible aspiration pneumonia  Risks, benefits, alternatives of IV thrombolysis were discussed and family/patient agreed to proceed. CT imaging was reviewed personally prior to IV thrombolysis administration with no evidence of bleed.  Case discussed with neurointerventionalist over the phone.  Case discussed with ED  provider.  Case management and treatment philosophy discussed with daughter over the phone and then in person.  Consent for IR obtained from the daughter.  -- Amie Portland, MD Neurologist [facility] Pager: [phone]  CRITICAL CARE ATTESTATION Performed by: Amie Portland, MD Total critical care time: 39 minutes Critical care time was exclusive of separately billable procedures and treating other patients and/or supervising APPs/Residents/Students Critical care was necessary to treat or prevent imminent or life-threatening deterioration. This patient is critically ill and at significant risk for neurological worsening and/or death and care requires constant monitoring. Critical care was time spent personally by me on the following activities: development of treatment plan with patient and/or surrogate as well as nursing, discussions with consultants, evaluation of patient's response to treatment, examination of patient, obtaining history from patient or surrogate, ordering and performing treatments and interventions, ordering and review of laboratory studies, ordering and review of radiographic studies, pulse oximetry, re-evaluation of patient's condition, participation in multidisciplinary rounds and medical decision making of high complexity in the care of this patient.

## 2022-10-05 ENCOUNTER — Inpatient Hospital Stay (HOSPITAL_COMMUNITY): Payer: Medicaid Other

## 2022-10-05 ENCOUNTER — Inpatient Hospital Stay: Payer: Self-pay

## 2022-10-05 DIAGNOSIS — Z8673 Personal history of transient ischemic attack (TIA), and cerebral infarction without residual deficits: Secondary | ICD-10-CM

## 2022-10-05 DIAGNOSIS — F1010 Alcohol abuse, uncomplicated: Secondary | ICD-10-CM

## 2022-10-05 DIAGNOSIS — F1410 Cocaine abuse, uncomplicated: Secondary | ICD-10-CM

## 2022-10-05 DIAGNOSIS — G936 Cerebral edema: Secondary | ICD-10-CM

## 2022-10-05 DIAGNOSIS — I6389 Other cerebral infarction: Secondary | ICD-10-CM

## 2022-10-05 LAB — GLUCOSE, CAPILLARY
Glucose-Capillary: 155 mg/dL — ABNORMAL HIGH (ref 70–99)
Glucose-Capillary: 162 mg/dL — ABNORMAL HIGH (ref 70–99)
Glucose-Capillary: 140 mg/dL — ABNORMAL HIGH (ref 70–99)
Glucose-Capillary: 124 mg/dL — ABNORMAL HIGH (ref 70–99)
Glucose-Capillary: 133 mg/dL — ABNORMAL HIGH (ref 70–99)
Glucose-Capillary: 145 mg/dL — ABNORMAL HIGH (ref 70–99)

## 2022-10-05 LAB — ECHOCARDIOGRAM COMPLETE
Area-P 1/2: 3.65 cm2
Height: 63 in
S' Lateral: 2.8 cm
Weight: 3619.07 oz

## 2022-10-05 LAB — LIPID PANEL
Cholesterol: 252 mg/dL — ABNORMAL HIGH (ref 0–200)
HDL: 53 mg/dL (ref >40)
LDL Cholesterol: 174 mg/dL — ABNORMAL HIGH (ref 0–99)
Total CHOL/HDL Ratio: 4.8 RATIO
Triglycerides: 126 mg/dL (ref <150)
VLDL: 25 mg/dL (ref 0–40)

## 2022-10-05 LAB — CBC
HCT: 50.2 % — ABNORMAL HIGH (ref 36.0–46.0)
Hemoglobin: 17.4 g/dL — ABNORMAL HIGH (ref 12.0–15.0)
MCH: 30.5 pg (ref 26.0–34.0)
MCHC: 34.7 g/dL (ref 30.0–36.0)
MCV: 87.9 fL (ref 80.0–100.0)
Platelets: 198 10*3/uL (ref 150–400)
RBC: 5.71 MIL/uL — ABNORMAL HIGH (ref 3.87–5.11)
RDW: 13.6 % (ref 11.5–15.5)
WBC: 5.8 10*3/uL (ref 4.0–10.5)
nRBC: 0 % (ref 0.0–0.2)

## 2022-10-05 LAB — RAPID URINE DRUG SCREEN, HOSP PERFORMED
Amphetamines: NOT DETECTED
Barbiturates: NOT DETECTED
Benzodiazepines: NOT DETECTED
Cocaine: POSITIVE — AB
Opiates: NOT DETECTED
Tetrahydrocannabinol: NOT DETECTED

## 2022-10-05 LAB — PHOSPHORUS
Phosphorus: 4.2 mg/dL (ref 2.5–4.6)
Phosphorus: 3.5 mg/dL (ref 2.5–4.6)

## 2022-10-05 LAB — HIV ANTIBODY (ROUTINE TESTING W REFLEX): HIV Screen 4th Generation wRfx: NONREACTIVE

## 2022-10-05 LAB — BASIC METABOLIC PANEL
Anion gap: 14 (ref 5–15)
BUN: 27 mg/dL — ABNORMAL HIGH (ref 6–20)
CO2: 21 mmol/L — ABNORMAL LOW (ref 22–32)
Calcium: 8.7 mg/dL — ABNORMAL LOW (ref 8.9–10.3)
Chloride: 104 mmol/L (ref 98–111)
Creatinine, Ser: 1.79 mg/dL — ABNORMAL HIGH (ref 0.44–1.00)
GFR, Estimated: 33 mL/min — ABNORMAL LOW (ref >60)
Glucose, Bld: 129 mg/dL — ABNORMAL HIGH (ref 70–99)
Potassium: 3.9 mmol/L (ref 3.5–5.1)
Sodium: 139 mmol/L (ref 135–145)

## 2022-10-05 LAB — SODIUM
Sodium: 140 mmol/L (ref 135–145)
Sodium: 145 mmol/L (ref 135–145)
Sodium: 151 mmol/L — ABNORMAL HIGH (ref 135–145)

## 2022-10-05 LAB — MRSA NEXT GEN BY PCR, NASAL: MRSA by PCR Next Gen: NOT DETECTED

## 2022-10-05 LAB — MAGNESIUM
Magnesium: 2.1 mg/dL (ref 1.7–2.4)
Magnesium: 2.2 mg/dL (ref 1.7–2.4)

## 2022-10-05 MED ORDER — HEPARIN SODIUM (PORCINE) 5000 UNIT/ML IJ SOLN
5000.0000 [IU] | Freq: Three times a day (TID) | INTRAMUSCULAR | Status: DC
  Administered 2022-10-05 – 2022-10-16 (×32): 5000 [IU] via SUBCUTANEOUS
  Filled 2022-10-05 (×32): qty 1

## 2022-10-05 MED ORDER — ONDANSETRON HCL 4 MG/2ML IJ SOLN: 4.0000 mg | Freq: Four times a day (QID) | INTRAMUSCULAR | Status: DC | PRN

## 2022-10-05 MED ORDER — CHLORHEXIDINE GLUCONATE CLOTH 2 % EX PADS
6.0000 | MEDICATED_PAD | Freq: Every day | CUTANEOUS | Status: DC
  Administered 2022-10-05 – 2022-10-18 (×12): 6 via TOPICAL

## 2022-10-05 MED ORDER — ROSUVASTATIN CALCIUM 20 MG PO TABS
40.0000 mg | ORAL_TABLET | Freq: Every day | ORAL | Status: DC
  Administered 2022-10-06 – 2022-10-15 (×10): 40 mg
  Filled 2022-10-05 (×10): qty 2

## 2022-10-05 MED ORDER — ASPIRIN 325 MG PO TABS
325.0000 mg | ORAL_TABLET | Freq: Every day | ORAL | Status: DC
  Administered 2022-10-06 – 2022-10-15 (×10): 325 mg
  Filled 2022-10-05 (×10): qty 1

## 2022-10-05 MED ORDER — PROSOURCE TF20 ENFIT COMPATIBL EN LIQD
60.0000 mL | Freq: Every day | ENTERAL | Status: DC
  Filled 2022-10-05: qty 60

## 2022-10-05 MED ORDER — PROSOURCE TF20 ENFIT COMPATIBL EN LIQD
60.0000 mL | Freq: Two times a day (BID) | ENTERAL | Status: DC
  Administered 2022-10-05 – 2022-10-09 (×9): 60 mL
  Filled 2022-10-05 (×9): qty 60

## 2022-10-05 MED ORDER — SODIUM CHLORIDE 3 % IV SOLN
INTRAVENOUS | Status: DC
  Filled 2022-10-05 (×4): qty 500

## 2022-10-05 MED ORDER — FENTANYL CITRATE PF 50 MCG/ML IJ SOSY
50.0000 ug | PREFILLED_SYRINGE | INTRAMUSCULAR | Status: DC | PRN
  Administered 2022-10-05 (×2): 50 ug via INTRAVENOUS
  Filled 2022-10-05 (×2): qty 1

## 2022-10-05 MED ORDER — PROPOFOL 1000 MG/100ML IV EMUL
INTRAVENOUS | Status: AC
  Filled 2022-10-05: qty 100

## 2022-10-05 MED ORDER — ACETAMINOPHEN 650 MG RE SUPP: 650.0000 mg | RECTAL | Status: DC | PRN

## 2022-10-05 MED ORDER — SODIUM CHLORIDE 3 % IV BOLUS
250.0000 mL | Freq: Once | INTRAVENOUS | Status: AC
  Administered 2022-10-05: 250 mL via INTRAVENOUS

## 2022-10-05 MED ORDER — ACETAMINOPHEN 325 MG PO TABS: 650.0000 mg | ORAL_TABLET | ORAL | Status: DC | PRN

## 2022-10-05 MED ORDER — PROPOFOL 1000 MG/100ML IV EMUL
0.0000 ug/kg/min | INTRAVENOUS | Status: DC
  Administered 2022-10-05: 45 ug/kg/min via INTRAVENOUS
  Administered 2022-10-05 – 2022-10-06 (×10): 50 ug/kg/min via INTRAVENOUS
  Administered 2022-10-06: 40 ug/kg/min via INTRAVENOUS
  Administered 2022-10-07: 20 ug/kg/min via INTRAVENOUS
  Administered 2022-10-07: 40 ug/kg/min via INTRAVENOUS
  Administered 2022-10-07 (×2): 50 ug/kg/min via INTRAVENOUS
  Administered 2022-10-07 (×2): 40 ug/kg/min via INTRAVENOUS
  Administered 2022-10-08: 25 ug/kg/min via INTRAVENOUS
  Administered 2022-10-08: 30 ug/kg/min via INTRAVENOUS
  Administered 2022-10-08 (×2): 40 ug/kg/min via INTRAVENOUS
  Administered 2022-10-08: 25 ug/kg/min via INTRAVENOUS
  Administered 2022-10-09: 15 ug/kg/min via INTRAVENOUS
  Administered 2022-10-09: 25 ug/kg/min via INTRAVENOUS
  Filled 2022-10-05 (×5): qty 100
  Filled 2022-10-05: qty 200
  Filled 2022-10-05 (×8): qty 100
  Filled 2022-10-05: qty 200
  Filled 2022-10-05 (×10): qty 100

## 2022-10-05 MED ORDER — ORAL CARE MOUTH RINSE
15.0000 mL | OROMUCOSAL | Status: DC
  Administered 2022-10-05 – 2022-10-16 (×127): 15 mL via OROMUCOSAL

## 2022-10-05 MED ORDER — VITAL AF 1.2 CAL PO LIQD
1000.0000 mL | ORAL | Status: DC
  Administered 2022-10-05 – 2022-10-08 (×4): 1000 mL

## 2022-10-05 MED ORDER — ALBUTEROL SULFATE (2.5 MG/3ML) 0.083% IN NEBU
INHALATION_SOLUTION | RESPIRATORY_TRACT | Status: AC
  Administered 2022-10-05: 2.5 mg
  Filled 2022-10-05: qty 3

## 2022-10-05 MED ORDER — FENTANYL CITRATE (PF) 100 MCG/2ML IJ SOLN
INTRAMUSCULAR | Status: AC
  Filled 2022-10-05: qty 2

## 2022-10-05 MED ORDER — SODIUM CHLORIDE 0.9% FLUSH: 10.0000 mL | INTRAVENOUS | Status: DC | PRN

## 2022-10-05 MED ORDER — DOCUSATE SODIUM 50 MG/5ML PO LIQD
100.0000 mg | Freq: Two times a day (BID) | ORAL | Status: DC
  Administered 2022-10-05 – 2022-10-08 (×7): 100 mg
  Filled 2022-10-05 (×7): qty 10

## 2022-10-05 MED ORDER — ACETAMINOPHEN 160 MG/5ML PO SOLN
650.0000 mg | ORAL | Status: DC | PRN
  Administered 2022-10-06 – 2022-10-14 (×7): 650 mg
  Filled 2022-10-05 (×8): qty 20.3

## 2022-10-05 MED ORDER — ORAL CARE MOUTH RINSE: 15.0000 mL | OROMUCOSAL | Status: DC | PRN

## 2022-10-05 MED ORDER — VITAL HIGH PROTEIN PO LIQD: 1000.0000 mL | ORAL | Status: DC

## 2022-10-05 MED ORDER — POLYETHYLENE GLYCOL 3350 17 G PO PACK
17.0000 g | PACK | Freq: Every day | ORAL | Status: DC
  Administered 2022-10-06 – 2022-10-08 (×3): 17 g
  Filled 2022-10-05 (×3): qty 1

## 2022-10-05 MED ORDER — SODIUM CHLORIDE 0.9% FLUSH
10.0000 mL | Freq: Two times a day (BID) | INTRAVENOUS | Status: DC
  Administered 2022-10-05: 10 mL
  Administered 2022-10-06: 30 mL
  Administered 2022-10-06 – 2022-10-08 (×3): 10 mL
  Administered 2022-10-09 – 2022-10-10 (×2): 20 mL
  Administered 2022-10-11 – 2022-10-15 (×11): 10 mL

## 2022-10-05 MED ORDER — FENTANYL CITRATE PF 50 MCG/ML IJ SOSY
50.0000 ug | PREFILLED_SYRINGE | INTRAMUSCULAR | Status: DC | PRN
  Administered 2022-10-05 (×3): 200 ug via INTRAVENOUS
  Filled 2022-10-05 (×3): qty 4

## 2022-10-05 MED ORDER — INSULIN ASPART 100 UNIT/ML IJ SOLN
0.0000 [IU] | INTRAMUSCULAR | Status: DC
  Administered 2022-10-05 (×2): 1 [IU] via SUBCUTANEOUS
  Administered 2022-10-05: 2 [IU] via SUBCUTANEOUS
  Administered 2022-10-05: 1 [IU] via SUBCUTANEOUS
  Administered 2022-10-05: 2 [IU] via SUBCUTANEOUS
  Administered 2022-10-05 – 2022-10-07 (×10): 1 [IU] via SUBCUTANEOUS
  Administered 2022-10-07 (×2): 2 [IU] via SUBCUTANEOUS
  Administered 2022-10-08: 1 [IU] via SUBCUTANEOUS
  Administered 2022-10-08: 2 [IU] via SUBCUTANEOUS
  Administered 2022-10-08 (×3): 1 [IU] via SUBCUTANEOUS
  Administered 2022-10-09: 2 [IU] via SUBCUTANEOUS
  Administered 2022-10-09 – 2022-10-10 (×9): 1 [IU] via SUBCUTANEOUS
  Administered 2022-10-10: 2 [IU] via SUBCUTANEOUS
  Administered 2022-10-10 – 2022-10-11 (×5): 1 [IU] via SUBCUTANEOUS
  Administered 2022-10-11: 2 [IU] via SUBCUTANEOUS
  Administered 2022-10-11 – 2022-10-12 (×3): 1 [IU] via SUBCUTANEOUS
  Administered 2022-10-12: 2 [IU] via SUBCUTANEOUS
  Administered 2022-10-12 (×2): 1 [IU] via SUBCUTANEOUS
  Administered 2022-10-12: 2 [IU] via SUBCUTANEOUS
  Administered 2022-10-13 (×2): 1 [IU] via SUBCUTANEOUS
  Administered 2022-10-13: 2 [IU] via SUBCUTANEOUS
  Administered 2022-10-13 – 2022-10-16 (×7): 1 [IU] via SUBCUTANEOUS

## 2022-10-05 MED ORDER — PROPOFOL 1000 MG/100ML IV EMUL
5.0000 ug/kg/min | INTRAVENOUS | Status: DC
  Administered 2022-10-05: 50 ug/kg/min via INTRAVENOUS

## 2022-10-05 NOTE — Progress Notes (Signed)
Initial Nutrition Assessment  DOCUMENTATION CODES:   Not applicable  INTERVENTION:   Initiate tube feeding via Cortrak tube: Vital AF 1.2 at 50 ml/h (1200 ml per day) Prosource TF20 60 ml BID  Provides 1600 kcal, 130 gm protein, 973 ml free water daily   NUTRITION DIAGNOSIS:   Inadequate oral intake related to inability to eat as evidenced by NPO status.  GOAL:   Patient will meet greater than or equal to 90% of their needs  MONITOR:   TF tolerance  REASON FOR ASSESSMENT:   Consult Enteral/tube feeding initiation and management  ASSESSMENT:   Pt with PMH of asthma, depression, ETOH use, HTN, sickle cell trait, CHF, DM, and CKD stage 3 now admitted with R MCA occlusion s/p mechanical thrombectomy with TICI 2 recanalization but then had subsequent re-occlusion and post procedure SAH.   Pt discussed during ICU rounds and with RN.  Pt receiving ECHO during visit.   12/15 - s/p cortrak tube; per xray tip in distal stomach    Medications reviewed and include: colace, SSI, protonix, miralax  Cleviprex @ 8 ml/hr provides: 384 kcal  Propofol @ 30 ml/hr provides: 792 kcal  Hypertonic saline   Labs reviewed:  CBG's: 116-162  NUTRITION - FOCUSED PHYSICAL EXAM:  Deferred  Diet Order:   Diet Order             Diet NPO time specified  Diet effective now                   EDUCATION NEEDS:   Not appropriate for education at this time  Skin:  Skin Assessment: Reviewed RN Assessment  Last BM:  unknown  Height:   Ht Readings from Last 1 Encounters:  10/05/22 '5\' 3"'$  (1.6 m)    Weight:   Wt Readings from Last 1 Encounters:  09/25/2022 102.6 kg    BMI:  Body mass index is 40.07 kg/m.  Estimated Nutritional Needs:   Kcal:  1600  Protein:  105-130 grams  Fluid:  >1.6 L/day  Lockie Pares., RD, LDN, CNSC See AMiON for contact information

## 2022-10-05 NOTE — Progress Notes (Signed)
CTH  IMPRESSION: 1. Right perisylvian subarachnoid hemorrhage/contrast has diffused but not clearly increased. 2. Continue contrast blush extensively within right anterior frontal and temporal cortex and in the right basal ganglia ischemia, ACA and MCA territory is involved. Additional cytotoxic edema in the lateral and high right frontal cortex.   Recs: Bolus 3% and Start 3% saline at 75 cc/hr Repeat imaging in 6 hours. Continue current management  -- Amie Portland, MD Neurologist [facility] Pager: [phone]  Additional 15 min of cc time.

## 2022-10-05 NOTE — Procedures (Signed)
Cortrak  Tube Type:  Cortrak - 43 inches Tube Location:  Left nare Initial Placement:  Stomach Secured by: Bridle Technique Used to Measure Tube Placement:  Marking at nare/corner of mouth Cortrak Secured At:  70 cm   Cortrak Tube Team Note:  Consult received to place a Cortrak feeding tube.   X-ray is required, abdominal x-ray has been ordered by the Cortrak team. Please confirm tube placement before using the Cortrak tube.   If the tube becomes dislodged please keep the tube and contact the Cortrak team at [email or web address] for replacement.  If after hours and replacement cannot be delayed, place a NG tube and confirm placement with an abdominal x-ray.    Mansfield Dann MS, RD, LDN Please refer to AMION for RD and/or RD on-call/weekend/after hours pager   

## 2022-10-05 NOTE — Progress Notes (Signed)
Pt traveled to and from CT with no issues.

## 2022-10-05 NOTE — Progress Notes (Signed)
  Echocardiogram 2D Echocardiogram attempted at 1010. Dietitian is with pt at this time. Will re-attempt as schedule permits.  Eartha Inch 10/05/2022, 10:11 AM

## 2022-10-05 NOTE — Progress Notes (Signed)
  Echocardiogram 2D Echocardiogram has been performed.  Taylor Vazquez 10/05/2022, 11:56 AM

## 2022-10-05 NOTE — Progress Notes (Signed)
eLink Physician-Brief Progress Note Patient Name: Taylor Vazquez DOB: 11-02-1965 MRN: [id number]   Date of Service  10/05/2022  HPI/Events of Note  Patient admitted to neuro ICU intubated and mechanically ventilated s/p unsuccessful IR thrombectomy attempt following admission for ischemic CVA.  eICU Interventions  New Patient Evaluation.        Kerry Kass Silvia Markuson 10/05/2022, 5:13 AM

## 2022-10-05 NOTE — Progress Notes (Addendum)
Peripherally Inserted Central Catheter Placement  The IV Nurse has discussed with the patient and/or persons authorized to consent for the patient, the purpose of this procedure and the potential benefits and risks involved with this procedure.  The benefits include less needle sticks, lab draws from the catheter, and the patient may be discharged home with the catheter. Risks include, but not limited to, infection, bleeding, blood clot (thrombus formation), and puncture of an artery; nerve damage and irregular heartbeat and possibility to perform a PICC exchange if needed/ordered by physician.  Alternatives to this procedure were also discussed.  Bard Power PICC patient education guide, fact sheet on infection prevention and patient information card has been provided to patient /or left at bedside.    PICC Placement Documentation  PICC Triple Lumen 37/34/28 Right Basilic 40 cm 1 cm (Active)  Indication for Insertion or Continuance of Line Vasoactive infusions 10/05/22 1840  Exposed Catheter (cm) 1 cm 10/05/22 1840  Site Assessment Clean, Dry, Intact 10/05/22 1840  Lumen #1 Status Flushed;Saline locked;Blood return noted 10/05/22 1840  Lumen #2 Status Flushed;Saline locked;Blood return noted 10/05/22 1840  Lumen #3 Status Flushed;Saline locked;Blood return noted 10/05/22 1840  Dressing Type Transparent;Securing device 10/05/22 1840  Dressing Status Antimicrobial disc in place;Clean, Dry, Intact 10/05/22 1840  Safety Lock Not Applicable 76/81/15 7262  Line Care Connections checked and tightened 10/05/22 1840  Line Adjustment (NICU/IV Team Only) No 10/05/22 1840  Dressing Intervention New dressing;Other (Comment) 10/05/22 1840  Dressing Change Due 10/12/22 10/05/22 1840    Patient's daughter, Shulamis Wenberg, signed PICC consent via telephone. Verified by 2 PICC RNs.   Enos Fling 10/05/2022, 6:41 PM

## 2022-10-05 NOTE — Progress Notes (Signed)
Supervising Physician: Pedro Earls  Patient Status:  [facility] - In-pt  Chief Complaint:  Stroke, 1 day s/p mechanical thrombectomy  Subjective: Pt intubated.  Sister and daughter bedside.  Opens eyes to name.  RN reports purposeful right sided movement and withdrawing of all 4 extremities to pain.   Allergies: Bee venom and Shellfish allergy  Medications: Prior to Admission medications   Medication Sig Start Date End Date Taking? Authorizing Provider  acetaminophen (TYLENOL) 325 MG tablet Take 2 tablets (650 mg total) by mouth every 6 (six) hours as needed for mild pain or moderate pain. 06/10/15   Kinnie Feil, MD  albuterol (PROVENTIL) (2.5 MG/3ML) 0.083% nebulizer solution Use 3 mLs (2.5 mg total) by nebulization every 6 (six) hours as needed for wheezing or shortness of breath. 05/16/22   Argentina Donovan, PA-C  albuterol (VENTOLIN HFA) 108 (90 Base) MCG/ACT inhaler INHALE 2 PUFFS INTO THE LUNGS EVERY 6 (SIX) HOURS AS NEEDED FOR WHEEZING OR SHORTNESS OF BREATH. 02/06/22   Gildardo Pounds, NP  amLODipine (NORVASC) 10 MG tablet Take 1 tablet (10 mg total) by mouth daily. 05/16/22   Argentina Donovan, PA-C  atorvastatin (LIPITOR) 20 MG tablet Take 1 tablet (20 mg total) by mouth daily. 05/16/22   Argentina Donovan, PA-C  fluticasone furoate-vilanterol (BREO ELLIPTA) 100-25 MCG/ACT AEPB Inhale 1 puff into the lungs daily. 05/25/22   Charlott Rakes, MD  metFORMIN (GLUCOPHAGE) 500 MG tablet Take 1 tablet (500 mg total) by mouth daily with breakfast. 05/16/22 06/24/22  Argentina Donovan, PA-C  ondansetron (ZOFRAN ODT) 4 MG disintegrating tablet Take 1 tablet (4 mg total) by mouth every 8 (eight) hours as needed for nausea or vomiting. 08/22/21   Mayers, Cari S, PA-C  pantoprazole (PROTONIX) 20 MG tablet Take 1 tablet (20 mg total) by mouth daily. 02/06/22   Gildardo Pounds, NP  sertraline (ZOLOFT) 50 MG tablet Take 1 tablet (50 mg total) by mouth daily. 05/16/22   Argentina Donovan, PA-C  traMADol (ULTRAM) 50 MG tablet Take 1 tablet (50 mg total) by mouth every 6 (six) hours as needed. 05/16/22   Argentina Donovan, PA-C  traMADol (ULTRAM) 50 MG tablet Take 1 tablet by mouth every 6 hours  as needed. 05/16/22   Argentina Donovan, PA-C  TRUEplus Lancets 28G MISC USE AS DIRECTED TWICE DAILY 03/10/20   Charlott Rakes, MD   Vital Signs: BP (!) 131/91   Pulse 98   Temp 98.6 F (37 C) (Axillary)   Resp (!) 23   Ht '5\' 3"'$  (1.6 m)   Wt 226 lb 3.1 oz (102.6 kg)   SpO2 100%   BMI 40.07 kg/m   Physical Exam Vitals reviewed.  Constitutional:      Interventions: She is intubated and restrained.  Eyes:     Pupils:     Right eye: Pupil is not reactive.     Left eye: Pupil is not reactive.  Cardiovascular:     Rate and Rhythm: Normal rate.     Comments: Right pedal pulse weak Left pedal pulse strong Pulmonary:     Effort: She is intubated.  Skin:    General: Skin is warm and dry.     Comments: Right CFA site soft, without bleeding, no pseudoaneurysm     Imaging: ECHOCARDIOGRAM COMPLETE  Result Date: 10/05/2022    ECHOCARDIOGRAM REPORT   Patient Name:   Taylor Vazquez [facility] Date of Exam: 10/05/2022 Medical Rec #:  [id number]          Height:       63.0 in Accession #:    [id number]         Weight:       226.2 lb Date of Birth:  Mar 27, 1966          BSA:          2.037 m Patient Age:    56 years           BP:           148/80 mmHg Patient Gender: F                  HR:           95 bpm. Exam Location:  Inpatient Procedure: 2D Echo, Color Doppler and Cardiac Doppler Indications:    Stroke  History:        Patient has prior history of Echocardiogram examinations, most                 recent 06/25/2017. CHF, Stroke; Risk Factors:Hypertension and                 Obesity. Cocaine and ETOH, sickle cell.  Sonographer:    Eartha Inch Referring Phys: [id number] ASHISH ARORA  Sonographer Comments: Echo performed with patient supine and on artificial respirator. Image acquisition  challenging due to patient body habitus and Image acquisition challenging due to respiratory motion. IMPRESSIONS  1. Left ventricular ejection fraction, by estimation, is 60 to 65%. The left ventricle has normal function. The left ventricle has no regional wall motion abnormalities. There is moderate left ventricular hypertrophy. Left ventricular diastolic parameters are consistent with Grade II diastolic dysfunction (pseudonormalization). Elevated left atrial pressure.  2. Right ventricular systolic function is normal. The right ventricular size is normal. Tricuspid regurgitation signal is inadequate for assessing PA pressure.  3. Left atrial size was mildly dilated.  4. The mitral valve is normal in structure. No evidence of mitral valve regurgitation. No evidence of mitral stenosis.  5. The aortic valve is tricuspid. Aortic valve regurgitation is not visualized. No aortic stenosis is present.  6. The inferior vena cava is normal in size with <50% respiratory variability, suggesting right atrial pressure of 8 mmHg. FINDINGS  Left Ventricle: Left ventricular ejection fraction, by estimation, is 60 to 65%. The left ventricle has normal function. The left ventricle has no regional wall motion abnormalities. The left ventricular internal cavity size was normal in size. There is  moderate left ventricular hypertrophy. Left ventricular diastolic parameters are consistent with Grade II diastolic dysfunction (pseudonormalization). Elevated left atrial pressure. Right Ventricle: The right ventricular size is normal. No increase in right ventricular wall thickness. Right ventricular systolic function is normal. Tricuspid regurgitation signal is inadequate for assessing PA pressure. Left Atrium: Left atrial size was mildly dilated. Right Atrium: Right atrial size was normal in size. Pericardium: Trivial pericardial effusion is present. Mitral Valve: The mitral valve is normal in structure. No evidence of mitral valve  regurgitation. No evidence of mitral valve stenosis. Tricuspid Valve: The tricuspid valve is normal in structure. Tricuspid valve regurgitation is trivial. Aortic Valve: The aortic valve is tricuspid. Aortic valve regurgitation is not visualized. No aortic stenosis is present. Pulmonic Valve: The pulmonic valve was not well visualized. Pulmonic valve regurgitation is not visualized. Aorta: The aortic root is normal in size and structure. Venous: The inferior vena cava is normal in size with less than 50% respiratory variability,  suggesting right atrial pressure of 8 mmHg. IAS/Shunts: The interatrial septum was not well visualized.  LEFT VENTRICLE PLAX 2D LVIDd:         4.60 cm   Diastology LVIDs:         2.80 cm   LV e' medial:    5.00 cm/s LV PW:         1.20 cm   LV E/e' medial:  15.3 LV IVS:        1.30 cm   LV e' lateral:   5.22 cm/s LVOT diam:     2.30 cm   LV E/e' lateral: 14.7 LV SV:         103 LV SV Index:   51 LVOT Area:     4.15 cm  RIGHT VENTRICLE             IVC RV S prime:     15.70 cm/s  IVC diam: 2.00 cm TAPSE (M-mode): 2.1 cm LEFT ATRIUM             Index        RIGHT ATRIUM           Index LA diam:        3.80 cm 1.87 cm/m   RA Area:     11.10 cm LA Vol (A2C):   57.1 ml 28.02 ml/m  RA Volume:   21.50 ml  10.55 ml/m LA Vol (A4C):   77.6 ml 38.09 ml/m LA Biplane Vol: 68.8 ml 33.77 ml/m  AORTIC VALVE LVOT Vmax:   123.00 cm/s LVOT Vmean:  77.700 cm/s LVOT VTI:    0.249 m  AORTA Ao Root diam: 3.30 cm MITRAL VALVE MV Area (PHT): 3.65 cm    SHUNTS MV Decel Time: 208 msec    Systemic VTI:  0.25 m MV E velocity: 76.70 cm/s  Systemic Diam: 2.30 cm MV A velocity: 89.40 cm/s MV E/A ratio:  0.86 Oswaldo Milian MD Electronically signed by Oswaldo Milian MD Signature Date/Time: 10/05/2022/12:08:42 PM    Final    [place] EKG SITE RITE  Result Date: 10/05/2022 If Site Rite image not attached, placement could not be confirmed due to current cardiac rhythm.  DG Abd Portable 1V  Result Date:  10/05/2022 CLINICAL DATA:  Feeding tube placement. EXAM: PORTABLE ABDOMEN - 1 VIEW COMPARISON:  None Available. FINDINGS: Distal tip of feeding tube is seen in expected position of distal stomach. No abnormal bowel dilatation is noted. IMPRESSION: Distal tip of feeding tube is seen in expected position of distal stomach. Electronically Signed   By: Marijo Conception M.D.   On: 10/05/2022 10:55   CT HEAD WO CONTRAST (5MM)  Result Date: 10/05/2022 CLINICAL DATA:  Follow-up ICH EXAM: CT HEAD WITHOUT CONTRAST TECHNIQUE: Contiguous axial images were obtained from the base of the skull through the vertex without intravenous contrast. RADIATION DOSE REDUCTION: This exam was performed according to the departmental dose-optimization program which includes automated exposure control, adjustment of the mA and/or kV according to patient size and/or use of iterative reconstruction technique. COMPARISON:  Flat panel CT from yesterday FINDINGS: Brain: Continued increased hazy density within the cortex of the anterior right frontal and anterior right temporal cortex, including the parasagittal frontal region and extending into the basal ganglia. Linear high-density within the right sylvian fissure, likely combination of subarachnoid blood and contrast, similar in volume although likely somewhat diffused, [place] 3C. Extensive cytotoxic edema in the right frontal lobe. Local mass effect without significant midline shift and  no entrapment. No focal hematoma. Vascular: Stable Skull: Unremarkable Sinuses/Orbits: Unremarkable IMPRESSION: 1. Right perisylvian subarachnoid hemorrhage/contrast has diffused but not clearly increased. 2. Continue contrast blush extensively within right anterior frontal and temporal cortex and in the right basal ganglia ischemia, ACA and MCA territory is involved. Additional cytotoxic edema in the lateral and high right frontal cortex. Electronically Signed   By: Jorje Guild M.D.   On: 10/05/2022  04:18   DG Chest Port 1 View  Result Date: 10/05/2022 CLINICAL DATA:  Check endotracheal tube placement EXAM: PORTABLE CHEST 1 VIEW COMPARISON:  Film from earlier in the same day. FINDINGS: Endotracheal tube is now seen 17 mm above the carina in more stable position. Persistent vascular congestion is seen. Left retrocardiac density as well as a fusion is seen. IMPRESSION: Improved positioning of the endotracheal tube. Stable vascular congestion and left effusion. Electronically Signed   By: Inez Catalina M.D.   On: 10/05/2022 02:14   DG CHEST PORT 1 VIEW  Result Date: 10/05/2022 CLINICAL DATA:  Check endotracheal tube placement EXAM: PORTABLE CHEST 1 VIEW COMPARISON:  08/15/2021 FINDINGS: Cardiac shadow is enlarged. Endotracheal tube is noted at the level of the carina directed towards the right mainstem bronchus. This should be withdrawn at least 3-4 cm. Mild vascular congestion is noted. Left-sided pleural effusion is seen as well. IMPRESSION: Endotracheal tube directed towards right mainstem bronchus. This should be withdrawn several cm. Vascular congestion with left-sided effusion. Electronically Signed   By: Inez Catalina M.D.   On: 10/05/2022 02:13   CT HEAD CODE STROKE WO CONTRAST  Result Date: 09/27/2022 CLINICAL DATA:  Left facial droop, left-sided paralysis and rightward gaze deviation. EXAM: CT HEAD WITHOUT CONTRAST CT ANGIOGRAPHY OF THE HEAD AND NECK TECHNIQUE: Contiguous axial images were obtained from the base of the skull through the vertex without intravenous contrast. Multidetector CT imaging of the head and neck was performed using the standard protocol during bolus administration of intravenous contrast. Multiplanar CT image reconstructions and MIPs were obtained to evaluate the vascular anatomy. Carotid stenosis measurements (when applicable) are obtained utilizing NASCET criteria, using the distal internal carotid diameter as the denominator. RADIATION DOSE REDUCTION: This exam was  performed according to the departmental dose-optimization program which includes automated exposure control, adjustment of the mA and/or kV according to patient size and/or use of iterative reconstruction technique. CONTRAST:  67m OMNIPAQUE IOHEXOL 350 MG/ML SOLN COMPARISON:  None Available. FINDINGS: CT HEAD FINDINGS Brain: There is no mass, hemorrhage or extra-axial collection. The size and configuration of the ventricles and extra-axial CSF spaces are normal. There is mild hypoattenuation in the right corona radiata and at the right frontal periventricular white matter, new since 02/01/2021. Vascular: No abnormal hyperdensity of the major intracranial arteries or dural venous sinuses. No intracranial atherosclerosis. Skull: The visualized skull base, calvarium and extracranial soft tissues are normal. Sinuses/Orbits: No fluid levels or advanced mucosal thickening of the visualized paranasal sinuses. No mastoid or middle ear effusion. The orbits are normal. ASPECTS (AJeromeStroke Program Early CT Score) - Ganglionic level infarction (caudate, lentiform nuclei, internal capsule, insula, M1-M3 cortex): 5 - Supraganglionic infarction (M4-M6 cortex): 3 Total score (0-10 with 10 being normal): 8 CTA NECK FINDINGS SKELETON: There is no bony spinal canal stenosis. No lytic or blastic lesion. OTHER NECK: Normal pharynx, larynx and major salivary glands. No cervical lymphadenopathy. Unremarkable thyroid gland. UPPER CHEST: No pneumothorax or pleural effusion. No nodules or masses. AORTIC ARCH: There is no calcific atherosclerosis of the aortic arch.  There is no aneurysm, dissection or hemodynamically significant stenosis of the visualized portion of the aorta. Conventional 3 vessel aortic branching pattern. The visualized proximal subclavian arteries are widely patent. RIGHT CAROTID SYSTEM: Normal without aneurysm, dissection or stenosis. LEFT CAROTID SYSTEM: Normal without aneurysm, dissection or stenosis. VERTEBRAL  ARTERIES: Left dominant configuration. Both origins are clearly patent. There is no dissection, occlusion or flow-limiting stenosis to the skull base (V1-V3 segments). CTA HEAD FINDINGS POSTERIOR CIRCULATION: --Vertebral arteries: Normal V4 segments. --Inferior cerebellar arteries: Normal. --Basilar artery: Normal. --Superior cerebellar arteries: Normal. --Posterior cerebral arteries (PCA): Normal. ANTERIOR CIRCULATION: --Intracranial internal carotid arteries: Normal. --Anterior cerebral arteries (ACA): Normal. Both A1 segments are present. Patent anterior communicating artery (a-comm). --Middle cerebral arteries (MCA): Paucity of vascularity throughout much of the right MCA territory, specifically the posterior superior division and all of the inferior division. There is no clear cut off, but the branches are likely occluded at the MCA bifurcation. Minimal collateralization. Left MCA is normal. VENOUS SINUSES: As permitted by contrast timing, patent. ANATOMIC VARIANTS: None Review of the MIP images confirms the above findings. IMPRESSION: 1. No intracranial hemorrhage. ASPECTS is 8. 2. 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 These results were communicated to Dr. Amie Portland at 8:25 pm on 10/05/2022 by text page via the [facility] messaging system. Electronically Signed   By: Ulyses Jarred M.D.   On: 09/27/2022 20:32   CT ANGIO HEAD NECK W WO CM (CODE STROKE)  Result Date: 10/02/2022 CLINICAL DATA:  Left facial droop, left-sided paralysis and rightward gaze deviation. EXAM: CT HEAD WITHOUT CONTRAST CT ANGIOGRAPHY OF THE HEAD AND NECK TECHNIQUE: Contiguous axial images were obtained from the base of the skull through the vertex without intravenous contrast. Multidetector CT imaging of the head and neck was performed using the standard protocol during bolus administration of intravenous contrast. Multiplanar CT image reconstructions and MIPs were obtained to evaluate the vascular anatomy.  Carotid stenosis measurements (when applicable) are obtained utilizing NASCET criteria, using the distal internal carotid diameter as the denominator. RADIATION DOSE REDUCTION: This exam was performed according to the departmental dose-optimization program which includes automated exposure control, adjustment of the mA and/or kV according to patient size and/or use of iterative reconstruction technique. CONTRAST:  33m OMNIPAQUE IOHEXOL 350 MG/ML SOLN COMPARISON:  None Available. FINDINGS: CT HEAD FINDINGS Brain: There is no mass, hemorrhage or extra-axial collection. The size and configuration of the ventricles and extra-axial CSF spaces are normal. There is mild hypoattenuation in the right corona radiata and at the right frontal periventricular white matter, new since 02/01/2021. Vascular: No abnormal hyperdensity of the major intracranial arteries or dural venous sinuses. No intracranial atherosclerosis. Skull: The visualized skull base, calvarium and extracranial soft tissues are normal. Sinuses/Orbits: No fluid levels or advanced mucosal thickening of the visualized paranasal sinuses. No mastoid or middle ear effusion. The orbits are normal. ASPECTS (ASupremeStroke Program Early CT Score) - Ganglionic level infarction (caudate, lentiform nuclei, internal capsule, insula, M1-M3 cortex): 5 - Supraganglionic infarction (M4-M6 cortex): 3 Total score (0-10 with 10 being normal): 8 CTA NECK FINDINGS SKELETON: There is no bony spinal canal stenosis. No lytic or blastic lesion. OTHER NECK: Normal pharynx, larynx and major salivary glands. No cervical lymphadenopathy. Unremarkable thyroid gland. UPPER CHEST: No pneumothorax or pleural effusion. No nodules or masses. AORTIC ARCH: There is no calcific atherosclerosis of the aortic arch. There is no aneurysm, dissection or hemodynamically significant stenosis of the visualized portion of the aorta. Conventional 3 vessel aortic  branching pattern. The visualized proximal  subclavian arteries are widely patent. RIGHT CAROTID SYSTEM: Normal without aneurysm, dissection or stenosis. LEFT CAROTID SYSTEM: Normal without aneurysm, dissection or stenosis. VERTEBRAL ARTERIES: Left dominant configuration. Both origins are clearly patent. There is no dissection, occlusion or flow-limiting stenosis to the skull base (V1-V3 segments). CTA HEAD FINDINGS POSTERIOR CIRCULATION: --Vertebral arteries: Normal V4 segments. --Inferior cerebellar arteries: Normal. --Basilar artery: Normal. --Superior cerebellar arteries: Normal. --Posterior cerebral arteries (PCA): Normal. ANTERIOR CIRCULATION: --Intracranial internal carotid arteries: Normal. --Anterior cerebral arteries (ACA): Normal. Both A1 segments are present. Patent anterior communicating artery (a-comm). --Middle cerebral arteries (MCA): Paucity of vascularity throughout much of the right MCA territory, specifically the posterior superior division and all of the inferior division. There is no clear cut off, but the branches are likely occluded at the MCA bifurcation. Minimal collateralization. Left MCA is normal. VENOUS SINUSES: As permitted by contrast timing, patent. ANATOMIC VARIANTS: None Review of the MIP images confirms the above findings. IMPRESSION: 1. No intracranial hemorrhage. ASPECTS is 8. 2. 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 These results were communicated to Dr. Amie Portland at 8:25 pm on 10/09/2022 by text page via the [facility] messaging system. Electronically Signed   By: Ulyses Jarred M.D.   On: 10/08/2022 20:32    Labs:  CBC: Recent Labs    10/05/2022 1945 09/29/2022 1946 10/05/22 0510  WBC 8.8  --  5.8  HGB 16.5* 17.0* 17.4*  HCT 46.9* 50.0* 50.2*  PLT 178  --  198    COAGS: Recent Labs    10/14/2022 1945  INR 1.1  APTT 30    BMP: Recent Labs    05/29/22 1526 07/10/22 1108 10/21/2022 1945 09/21/2022 1946 10/05/22 0510 10/05/22 1048  NA 141 144 138 138 139 140  K 3.7 4.3 3.2* 3.2*  3.9  --   CL 102 106 106 106 104  --   CO2 21 24 19*  --  21*  --   GLUCOSE 100* 99 104* 104* 129*  --   BUN 25* 29* 24* 28* 27*  --   CALCIUM 9.7 8.8 8.6*  --  8.7*  --   CREATININE 1.52* 1.46* 1.51* 1.60* 1.79*  --   GFRNONAA  --   --  41*  --  33*  --     LIVER FUNCTION TESTS: Recent Labs    05/16/22 1445 07/10/22 1108 10/17/2022 1945  BILITOT 0.2 <0.2 0.5  AST '21 14 22  '$ ALT '13 13 18  '$ ALKPHOS 161* 155* 126  PROT 6.8 6.4 7.1  ALBUMIN 4.0 3.6* 3.4*    Assessment and Plan:  1 day s/p CVA with IR mechanical thrombectomy -- subsequent reocclusion and SAH --remains intubated --opens eyes to name, pupils fixed --CT shows edema and SAH non-progressive since prior imaging --further management per Neuro --NIR available as needed.   Electronically Signed: Pasty Spillers, PA 10/05/2022, 12:33 PM   I spent a total of 15 Minutes at the the patient's bedside AND on the patient's hospital floor or unit, greater than 50% of which was counseling/coordinating care for CVA management follow up.

## 2022-10-05 NOTE — Progress Notes (Addendum)
STROKE TEAM PROGRESS NOTE   INTERVAL HISTORY Her family is at the bedside.  Remains intubated and sedated, MRI pending   Vitals:   10/05/22 0530 10/05/22 0600 10/05/22 0630 10/05/22 0700  BP: (!) 158/104 (!) 142/92 (!) 168/104 115/73  Pulse:  83    Resp: 17 (!) 22 19 (!) 22  Temp:      TempSrc:      SpO2:  (!) 79%    Weight:      Height:       CBC:  Recent Labs  Lab 10/01/2022 1945 10/07/2022 1946 10/05/22 0510  WBC 8.8  --  5.8  NEUTROABS 3.8  --   --   HGB 16.5* 17.0* 17.4*  HCT 46.9* 50.0* 50.2*  MCV 87.2  --  87.9  PLT 178  --  784   Basic Metabolic Panel:  Recent Labs  Lab 10/03/2022 1945 10/13/2022 1946 10/05/22 0510  NA 138 138 139  K 3.2* 3.2* 3.9  CL 106 106 104  CO2 19*  --  21*  GLUCOSE 104* 104* 129*  BUN 24* 28* 27*  CREATININE 1.51* 1.60* 1.79*  CALCIUM 8.6*  --  8.7*   Lipid Panel:  Recent Labs  Lab 10/05/22 0510  CHOL 252*  TRIG 126  HDL 53  CHOLHDL 4.8  VLDL 25  LDLCALC 174*   HgbA1c: No results for input(s): "HGBA1C" in the last 168 hours. Urine Drug Screen: No results for input(s): "LABOPIA", "COCAINSCRNUR", "LABBENZ", "AMPHETMU", "THCU", "LABBARB" in the last 168 hours.  Alcohol Level  Recent Labs  Lab 09/28/2022 1939  ETH <10    IMAGING past 24 hours CT HEAD WO CONTRAST (5MM)  Result Date: 10/05/2022 CLINICAL DATA:  Follow-up ICH EXAM: CT HEAD WITHOUT CONTRAST TECHNIQUE: Contiguous axial images were obtained from the base of the skull through the vertex without intravenous contrast. RADIATION DOSE REDUCTION: This exam was performed according to the departmental dose-optimization program which includes automated exposure control, adjustment of the mA and/or kV according to patient size and/or use of iterative reconstruction technique. COMPARISON:  Flat panel CT from yesterday FINDINGS: Brain: Continued increased hazy density within the cortex of the anterior right frontal and anterior right temporal cortex, including the parasagittal  frontal region and extending into the basal ganglia. Linear high-density within the right sylvian fissure, likely combination of subarachnoid blood and contrast, similar in volume although likely somewhat diffused, [place] 3C. Extensive cytotoxic edema in the right frontal lobe. Local mass effect without significant midline shift and no entrapment. No focal hematoma. Vascular: Stable Skull: Unremarkable Sinuses/Orbits: Unremarkable IMPRESSION: 1. Right perisylvian subarachnoid hemorrhage/contrast has diffused but not clearly increased. 2. Continue contrast blush extensively within right anterior frontal and temporal cortex and in the right basal ganglia ischemia, ACA and MCA territory is involved. Additional cytotoxic edema in the lateral and high right frontal cortex. Electronically Signed   By: Jorje Guild M.D.   On: 10/05/2022 04:18   DG Chest Port 1 View  Result Date: 10/05/2022 CLINICAL DATA:  Check endotracheal tube placement EXAM: PORTABLE CHEST 1 VIEW COMPARISON:  Film from earlier in the same day. FINDINGS: Endotracheal tube is now seen 17 mm above the carina in more stable position. Persistent vascular congestion is seen. Left retrocardiac density as well as a fusion is seen. IMPRESSION: Improved positioning of the endotracheal tube. Stable vascular congestion and left effusion. Electronically Signed   By: Inez Catalina M.D.   On: 10/05/2022 02:14   DG CHEST PORT 1 VIEW  Result Date: 10/05/2022 CLINICAL DATA:  Check endotracheal tube placement EXAM: PORTABLE CHEST 1 VIEW COMPARISON:  08/15/2021 FINDINGS: Cardiac shadow is enlarged. Endotracheal tube is noted at the level of the carina directed towards the right mainstem bronchus. This should be withdrawn at least 3-4 cm. Mild vascular congestion is noted. Left-sided pleural effusion is seen as well. IMPRESSION: Endotracheal tube directed towards right mainstem bronchus. This should be withdrawn several cm. Vascular congestion with  left-sided effusion. Electronically Signed   By: Inez Catalina M.D.   On: 10/05/2022 02:13   CT HEAD CODE STROKE WO CONTRAST  Result Date: 10/11/2022 CLINICAL DATA:  Left facial droop, left-sided paralysis and rightward gaze deviation. EXAM: CT HEAD WITHOUT CONTRAST CT ANGIOGRAPHY OF THE HEAD AND NECK TECHNIQUE: Contiguous axial images were obtained from the base of the skull through the vertex without intravenous contrast. Multidetector CT imaging of the head and neck was performed using the standard protocol during bolus administration of intravenous contrast. Multiplanar CT image reconstructions and MIPs were obtained to evaluate the vascular anatomy. Carotid stenosis measurements (when applicable) are obtained utilizing NASCET criteria, using the distal internal carotid diameter as the denominator. RADIATION DOSE REDUCTION: This exam was performed according to the departmental dose-optimization program which includes automated exposure control, adjustment of the mA and/or kV according to patient size and/or use of iterative reconstruction technique. CONTRAST:  33m OMNIPAQUE IOHEXOL 350 MG/ML SOLN COMPARISON:  None Available. FINDINGS: CT HEAD FINDINGS Brain: There is no mass, hemorrhage or extra-axial collection. The size and configuration of the ventricles and extra-axial CSF spaces are normal. There is mild hypoattenuation in the right corona radiata and at the right frontal periventricular white matter, new since 02/01/2021. Vascular: No abnormal hyperdensity of the major intracranial arteries or dural venous sinuses. No intracranial atherosclerosis. Skull: The visualized skull base, calvarium and extracranial soft tissues are normal. Sinuses/Orbits: No fluid levels or advanced mucosal thickening of the visualized paranasal sinuses. No mastoid or middle ear effusion. The orbits are normal. ASPECTS (AAdairStroke Program Early CT Score) - Ganglionic level infarction (caudate, lentiform nuclei, internal  capsule, insula, M1-M3 cortex): 5 - Supraganglionic infarction (M4-M6 cortex): 3 Total score (0-10 with 10 being normal): 8 CTA NECK FINDINGS SKELETON: There is no bony spinal canal stenosis. No lytic or blastic lesion. OTHER NECK: Normal pharynx, larynx and major salivary glands. No cervical lymphadenopathy. Unremarkable thyroid gland. UPPER CHEST: No pneumothorax or pleural effusion. No nodules or masses. AORTIC ARCH: There is no calcific atherosclerosis of the aortic arch. There is no aneurysm, dissection or hemodynamically significant stenosis of the visualized portion of the aorta. Conventional 3 vessel aortic branching pattern. The visualized proximal subclavian arteries are widely patent. RIGHT CAROTID SYSTEM: Normal without aneurysm, dissection or stenosis. LEFT CAROTID SYSTEM: Normal without aneurysm, dissection or stenosis. VERTEBRAL ARTERIES: Left dominant configuration. Both origins are clearly patent. There is no dissection, occlusion or flow-limiting stenosis to the skull base (V1-V3 segments). CTA HEAD FINDINGS POSTERIOR CIRCULATION: --Vertebral arteries: Normal V4 segments. --Inferior cerebellar arteries: Normal. --Basilar artery: Normal. --Superior cerebellar arteries: Normal. --Posterior cerebral arteries (PCA): Normal. ANTERIOR CIRCULATION: --Intracranial internal carotid arteries: Normal. --Anterior cerebral arteries (ACA): Normal. Both A1 segments are present. Patent anterior communicating artery (a-comm). --Middle cerebral arteries (MCA): Paucity of vascularity throughout much of the right MCA territory, specifically the posterior superior division and all of the inferior division. There is no clear cut off, but the branches are likely occluded at the MCA bifurcation. Minimal collateralization. Left MCA is normal. VENOUS SINUSES: As  permitted by contrast timing, patent. ANATOMIC VARIANTS: None Review of the MIP images confirms the above findings. IMPRESSION: 1. No intracranial hemorrhage.  ASPECTS is 8. 2. 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 These results were communicated to Dr. Amie Portland at 8:25 pm on 10/08/2022 by text page via the [facility] messaging system. Electronically Signed   By: Ulyses Jarred M.D.   On: 10/14/2022 20:32   CT ANGIO HEAD NECK W WO CM (CODE STROKE)  Result Date: 10/12/2022 CLINICAL DATA:  Left facial droop, left-sided paralysis and rightward gaze deviation. EXAM: CT HEAD WITHOUT CONTRAST CT ANGIOGRAPHY OF THE HEAD AND NECK TECHNIQUE: Contiguous axial images were obtained from the base of the skull through the vertex without intravenous contrast. Multidetector CT imaging of the head and neck was performed using the standard protocol during bolus administration of intravenous contrast. Multiplanar CT image reconstructions and MIPs were obtained to evaluate the vascular anatomy. Carotid stenosis measurements (when applicable) are obtained utilizing NASCET criteria, using the distal internal carotid diameter as the denominator. RADIATION DOSE REDUCTION: This exam was performed according to the departmental dose-optimization program which includes automated exposure control, adjustment of the mA and/or kV according to patient size and/or use of iterative reconstruction technique. CONTRAST:  25m OMNIPAQUE IOHEXOL 350 MG/ML SOLN COMPARISON:  None Available. FINDINGS: CT HEAD FINDINGS Brain: There is no mass, hemorrhage or extra-axial collection. The size and configuration of the ventricles and extra-axial CSF spaces are normal. There is mild hypoattenuation in the right corona radiata and at the right frontal periventricular white matter, new since 02/01/2021. Vascular: No abnormal hyperdensity of the major intracranial arteries or dural venous sinuses. No intracranial atherosclerosis. Skull: The visualized skull base, calvarium and extracranial soft tissues are normal. Sinuses/Orbits: No fluid levels or advanced mucosal thickening of the visualized  paranasal sinuses. No mastoid or middle ear effusion. The orbits are normal. ASPECTS ([place]ridgeStroke Program Early CT Score) - Ganglionic level infarction (caudate, lentiform nuclei, internal capsule, insula, M1-M3 cortex): 5 - Supraganglionic infarction (M4-M6 cortex): 3 Total score (0-10 with 10 being normal): 8 CTA NECK FINDINGS SKELETON: There is no bony spinal canal stenosis. No lytic or blastic lesion. OTHER NECK: Normal pharynx, larynx and major salivary glands. No cervical lymphadenopathy. Unremarkable thyroid gland. UPPER CHEST: No pneumothorax or pleural effusion. No nodules or masses. AORTIC ARCH: There is no calcific atherosclerosis of the aortic arch. There is no aneurysm, dissection or hemodynamically significant stenosis of the visualized portion of the aorta. Conventional 3 vessel aortic branching pattern. The visualized proximal subclavian arteries are widely patent. RIGHT CAROTID SYSTEM: Normal without aneurysm, dissection or stenosis. LEFT CAROTID SYSTEM: Normal without aneurysm, dissection or stenosis. VERTEBRAL ARTERIES: Left dominant configuration. Both origins are clearly patent. There is no dissection, occlusion or flow-limiting stenosis to the skull base (V1-V3 segments). CTA HEAD FINDINGS POSTERIOR CIRCULATION: --Vertebral arteries: Normal V4 segments. --Inferior cerebellar arteries: Normal. --Basilar artery: Normal. --Superior cerebellar arteries: Normal. --Posterior cerebral arteries (PCA): Normal. ANTERIOR CIRCULATION: --Intracranial internal carotid arteries: Normal. --Anterior cerebral arteries (ACA): Normal. Both A1 segments are present. Patent anterior communicating artery (a-comm). --Middle cerebral arteries (MCA): Paucity of vascularity throughout much of the right MCA territory, specifically the posterior superior division and all of the inferior division. There is no clear cut off, but the branches are likely occluded at the MCA bifurcation. Minimal collateralization. Left MCA is  normal. VENOUS SINUSES: As permitted by contrast timing, patent. ANATOMIC VARIANTS: None Review of the MIP images confirms the above findings. IMPRESSION: 1.  No intracranial hemorrhage. ASPECTS is 8. 2. 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 These results were communicated to Dr. Amie Portland at 8:25 pm on 09/23/2022 by text page via the [facility] messaging system. Electronically Signed   By: Ulyses Jarred M.D.   On: 10/18/2022 20:32    PHYSICAL EXAM  Physical Exam  Constitutional: Appears well-developed and well-nourished.   Cardiovascular: Normal rate and regular rhythm.  Respiratory: Mechanically ventilated  Neuro: Mental Status: Intubated, sedated, opens eyes to noxious stimuli Cranial Nerves: II: Pupils are equal and sluggish III,IV, VI: Eyes are midline X: Cough and gag intact XI: Head is turned to the right Motor: Tone is normal. Bulk is normal. Spontaneous movement noted in right upper extremity Movement with painful stimuli in right lower extremity and left upper extremity Flicker of movement noted in left lower extremity Sensory: Localizes to pain in bilateral upper extremities and right lower extremity, no reaction to painful stimuli in left lower extremity    ASSESSMENT/PLAN Taylor Vazquez is a 56 y.o. female with history of CHF, hypertension, obesity, sickle cell trait, prior stroke, cocaine use, presenting for evaluation of sudden onset of left-sided weakness with weakness last known well at 6 PM.  She was given TNK and emergently taken for a mechanical thrombectomy of her M2 MCA posterior division branch.  They achieved temporary recanalization with subsequent reocclusion.  Flat panel CT showed intense contrast blushing and edema of the right frontal and anterior temporal cortex and basal ganglia, suggesting ongoing ischemia . A small right Sylvian hyperdensity is also seen, suggesting contrast extravasation with likely associated SAH.  She returned  to ICU intubated.  Awaiting MRI.  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 ASPECTS is 8.  CTA head & neck Right MCA M2 segment occlusion with poor collateralization. S/p IR with temporary recanalization with subsequent reocclusion (TICI 2A)  CT Head- Right perisylvian SAH/contrast has diffused but not clearly increased. Continue contrast blush extensively within right anterior frontal and temporal cortex and in the right basal ganglia ischemia, ACA and MCA territory is involved. Additional cytotoxic edema in the lateral and high right frontal cortex. MRI pending MRA pending 2D Echo EF 60-65%  LDL 174 HgbA1c 5.5 UDS cocaine positive VTE prophylaxis - heparin subq No antithrombotic prior to admission, now on ASA 325 Therapy recommendations: Pending Disposition: Pending  Cerebral edema with brain compression Hypertonic saline initiated at 38m/hr Sodium Q6 hr  Na 139 -> 140->145 PICC line ordered  Hypertension Home meds: Amlodipine Stable IV Cleviprex infusing BP goal 130-150  Hyperlipidemia Home meds: Atorvastatin 20 mg LDL 174, goal < 70 Put on crestor 40 Continue statin at discharge  Acute Hypoxic respiratory failure  CCM on board Remains intubated post procedure Wean to extubate per CCM team  Cocaine abuse Hx of cocaine use UDS positive for cocaine this time Cessation education will be provided.   Tobacco abuse Current smoker Smoking cessation counseling will be provided  Alcohol abuse Per daughter, she is drinking frequently Not compliant with medication with drinking CIWA protocol   Other Stroke Risk Factors Obesity, Body mass index is 40.07 kg/m., BMI >/= 30 associated with increased stroke risk, recommend weight loss, diet and exercise as appropriate  Hx of stroke Congestive heart failure  Other Active Problems Depression Sertraline 50  mg Hypokalemia Repleted, K now 3.9  Hospital day # 1  Patient seen  and examined by NP/APP with MD. MD to update note as needed.   Janine Ores, DNP, FNP-BC [facility] Pager: [phone]  ATTENDING NOTE: I reviewed above note and agree with the assessment and plan. Pt was seen and examined.   56 year old female with history of hypertension, CHF, obesity, smoker, cocaine abuse, alcohol abuse, stroke in 2015 admitted for left-sided weakness, right-sided gaze, agitation with combative and elevated BP.  CT no acute abnormality.  CTA head and neck showed right M2 occlusion.  Status post TNK and IR however unsuccessful IR with TICI2a and small SAH.  MRI showed right MCA large infarct.  MRA showed right M2 persistent occlusion.  EF 60 to 65%, LDL 174, A1c 5.5, UDS positive for cocaine.  Creatinine 1.60, hemoglobin 17.0.  Patient still intubated.  In 04/2014 admitted for slurred speech and right-sided weakness as well as chest pain.  CT no acute abnormality.  CTA head and neck no LVO.  Patient refused MRI.  EF 55 to 60%.  Carotid Doppler negative.  UDS positive for cocaine.  LDL 95, A1c 5.9, discharged on aspirin 325.  Per daughter, patient not compliant with medication, intermittently taking medication at home.  When she is drinking alcohol, she is not taking medication.  On exam, daughter at bedside.  Patient still intubated, on vent, eyes halfway open with voice, not follow commands.  With forced eye opening, eye midline, PERRL, no doll's eyes, positive with gag cough and corneal reflexes.  Has spontaneous movement of right arm and right leg with right arm able to against gravity.  No movement of left upper extremity on pain stimulation, slight withdrawal of left lower extremity on pain stimulation. Sensation, coordination and gait not tested.  Etiology patient stroke likely large vessel disease given uncontrolled risk factors.  However IR was not successful with large right MCA on  MRI and persistent right M2 occlusion on MRA.  Prognosis likely poor.  Currently on aspirin 325, Crestor 40.  Vent management per CCM, extubate as able.  Concerning for cerebral edema, on 3% saline, sodium 145 on the last check.  Taper off Cleviprex as able.  For detailed assessment and plan, please refer to above/below as I have made changes wherever appropriate.   Rosalin Hawking, MD PhD Stroke Neurology 10/05/2022 4:50 PM  This patient is critically ill due to large right MCA stroke, cerebral edema, alcohol abuse, respiratory failure and at significant risk of neurological worsening, death form recurrent stroke, hemorrhagic transformation, brain herniation, seizure. This patient's care requires constant monitoring of vital signs, hemodynamics, respiratory and cardiac monitoring, review of multiple databases, neurological assessment, discussion with family, other specialists and medical decision making of high complexity. I spent 40 minutes of neurocritical care time in the care of this patient. I had long discussion with daughter at bedside, updated pt current condition, treatment plan and potential prognosis, and answered all the questions.  She expressed understanding and appreciation.      To contact Stroke Continuity provider, please refer to [email or web address] After hours, contact General Neurology

## 2022-10-05 NOTE — Progress Notes (Signed)
OT Cancellation Note  Patient Details Name: Taylor Vazquez MRN: [id number] DOB: [birth year]   Cancelled Treatment:    Reason Eval/Treat Not Completed: Active bedrest order (Will assess when activity orders updated.)  [facility] 10/05/2022, 7:37 AM Maurie Boettcher, OT/L   Acute OT Clinical Specialist Acute Rehabilitation Services Pager [phone] Office [phone]

## 2022-10-05 NOTE — Progress Notes (Signed)
  Transition of Care [facility]) Screening Note   Patient Details  Name: Taylor Vazquez Date of Birth: 09-11-1966   Transition of Care [facility]) CM/SW Contact:    Benard Halsted, LCSW Phone Number: 10/05/2022, 9:46 AM    Transition of Care Department [facility]) has reviewed patient who is currently intubated. We will continue to monitor patient advancement through interdisciplinary progression rounds. If new patient transition needs arise, please place a TOC consult.

## 2022-10-05 NOTE — Progress Notes (Signed)
ETT withdrawn 1 cm. Pt transported on 100% w/settings as per flowsheet to 4N18 without incidence.

## 2022-10-05 NOTE — Progress Notes (Addendum)
NAME:  Taylor Vazquez, MRN:  [id number], DOB:  03-06-1966, LOS: 1 ADMISSION DATE:  09/23/2022, CONSULTATION DATE:  10/05/22 REFERRING MD: Rory Percy  , CHIEF COMPLAINT:  Code stroke   History of Present Illness:  Taylor Vazquez is a 56 y.o. F with PMH significant for asthma, depression, ETOH use, HTN, Sickle cell trait , HFpEF who presented to the ED after being found by her daughter with flaccid L side and R gaze deviation.  Her LKW was 1830 12/14 found at 1925.  She had a normal glucose and was brought to the ED as a code stroke.  She was initially hypertensive 200/100's.  The decision was made to administer TNK.  Imaging showed R MCA M2 occlusion and so interventional radiology consulted.  Pt was combative in the ED requiring ativan.  Labs were significant for K 3.2, Creatinine 1.6.    Mechanical thrombectomy was performed with TICI 2A with temporary recanalization, but then subsequent re-occlusion.  Repeat imaging with contrast extravasation and likely SAH.  She was left intubated and PCCM consulted.   Pertinent  Medical History   has a past medical history of Asthma, CHF (congestive heart failure) ([place]), Depression, Hypertension, Obesity, Sickle cell trait ([place]), and Stroke ([place]).   Significant Hospital Events: Including procedures, antibiotic start and stop dates in addition to other pertinent events   12/14 presented as a code stroke, R MCA M2 occlusion, taken to IR, possible SAH post-procedure and left intubated  Interim History / Subjective:   Remains critically ill, intubated on life support. I updated family at bedside   Objective   Blood pressure 115/73, pulse 81, temperature (!) 96.3 F (35.7 C), temperature source Oral, resp. rate 18, height '5\' 3"'$  (1.6 m), weight 102.6 kg, SpO2 100 %.    Vent Mode: PRVC FiO2 (%):  [50 %-100 %] 50 % Set Rate:  [18 bmp] 18 bmp Vt Set:  [450 mL-500 mL] 450 mL PEEP:  [5 cmH20-6 cmH20] 5 cmH20 Plateau Pressure:  [17 cmH20-22 cmH20] 17 cmH20    Intake/Output Summary (Last 24 hours) at 10/05/2022 0840 Last data filed at 10/05/2022 0700 Gross per 24 hour  Intake 1291.19 ml  Output 675 ml  Net 616.19 ml   Filed Weights   10/08/2022 1900  Weight: 102.6 kg    General:  critically ill, intubated on life support  HEENT: ETT in place  Neuro: sedated on propofol  CV: rrr, s1 s2 PULM: bl mechanically ventilated breath sounds BL  GI: soft nt nd  Extremities: no edema  Skin: no rash    Resolved Hospital Problem list     Assessment & Plan:   Post-op mechanical ventilation management 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  Complicated by Post-procedure Perisylvian SAH W/ right anterior frontal and temporal cortex, right basal ganglia ischemia Cytotoxic cerebral edema high lateral frontal cortex S/p thrombectomy with TICI 2a recanalization and stent retriever and aspiration but with subsequent re-occlusion and concern for SAH, left intubated P: Post stroke mtg per stroke team  - MRI pending  - pad guideline sedation with propofol - cleviprex for 120-140 SBP goal  - mental status limits sat sbt  - continue full vent support  - VAP ppx, HOB elevated  - 3% saline per neuro, q6H sodium checks  CKD stage 3a HTN Creatinine near baseline at 1.5 - holding home bp meds  - on clviprex at this time   Type 2 DM -hold metformin,  - continue SSI   Hypokalemia  -  replete  Tube feeds - will start   Best Practice (right click and "Reselect all SmartList Selections" daily)   Diet/type: tubefeeds DVT prophylaxis: SCD GI prophylaxis: PPI Lines: N/A Foley:  Yes, and it is still needed Code Status:  full code Last date of multidisciplinary goals of care discussion [ I updated patients family at bedside, sister and daughter were present]  Labs   CBC: Recent Labs  Lab 09/21/2022 1945 10/21/2022 1946 10/05/22 0510  WBC 8.8  --  5.8  NEUTROABS 3.8  --   --   HGB 16.5* 17.0* 17.4*  HCT 46.9* 50.0* 50.2*  MCV 87.2   --  87.9  PLT 178  --  956    Basic Metabolic Panel: Recent Labs  Lab 10/16/2022 1945 09/30/2022 1946 10/05/22 0510  NA 138 138 139  K 3.2* 3.2* 3.9  CL 106 106 104  CO2 19*  --  21*  GLUCOSE 104* 104* 129*  BUN 24* 28* 27*  CREATININE 1.51* 1.60* 1.79*  CALCIUM 8.6*  --  8.7*   GFR: Estimated Creatinine Clearance: 41.1 mL/min (A) (by C-G formula based on SCr of 1.79 mg/dL (H)). Recent Labs  Lab 09/29/2022 1945 10/05/22 0510  WBC 8.8 5.8    Liver Function Tests: Recent Labs  Lab 10/03/2022 1945  AST 22  ALT 18  ALKPHOS 126  BILITOT 0.5  PROT 7.1  ALBUMIN 3.4*   No results for input(s): "LIPASE", "AMYLASE" in the last 168 hours. No results for input(s): "AMMONIA" in the last 168 hours.  ABG    Component Value Date/Time   PHART 7.35 09/23/2022 2320   PCO2ART 39 09/21/2022 2320   PO2ART 76 (L) 09/28/2022 2320   HCO3 21.6 10/08/2022 2320   TCO2 21 (L) 10/01/2022 1946   ACIDBASEDEF 3.9 (H) 09/26/2022 2320   O2SAT 96.5 10/05/2022 2320     Coagulation Profile: Recent Labs  Lab 10/10/2022 1945  INR 1.1    Cardiac Enzymes: No results for input(s): "CKTOTAL", "CKMB", "CKMBINDEX", "TROPONINI" in the last 168 hours.  HbA1C: Hemoglobin A1C  Date/Time Value Ref Range Status  02/02/2021 03:36 PM 5.3 4.0 - 5.6 % Final  07/24/2018 10:57 AM 5.8 (A) 4.0 - 5.6 % Final   HbA1c, POC (prediabetic range)  Date/Time Value Ref Range Status  07/24/2018 10:57 AM 5.8 5.7 - 6.4 % Final   HbA1c, POC (controlled diabetic range)  Date/Time Value Ref Range Status  12/31/2018 03:31 PM 5.4 0.0 - 7.0 % Final  07/24/2018 10:57 AM 5.8 0.0 - 7.0 % Final   HbA1c POC (<> result, manual entry)  Date/Time Value Ref Range Status  07/24/2018 10:57 AM 5.8 4.0 - 5.6 % Final   Hgb A1c MFr Bld  Date/Time Value Ref Range Status  05/16/2022 02:45 PM 5.5 4.8 - 5.6 % Final    Comment:             Prediabetes: 5.7 - 6.4          Diabetes: >6.4          Glycemic control for adults with  diabetes: <7.0     CBG: Recent Labs  Lab 10/05/2022 1940 10/02/2022 2210 10/05/22 0313 10/05/22 0826  GLUCAP 120* 116* 155* 162*    Review of Systems:   Unable to obtain  Past Medical History:  She,  has a past medical history of Asthma, CHF (congestive heart failure) ([place]), Depression, Hypertension, Obesity, Sickle cell trait ([place]), and Stroke ([place]).   Surgical History:   Past  Surgical History:  Procedure Laterality Date   NO PAST SURGERIES       Social History:   reports that she has been smoking cigarettes. She has been smoking an average of .25 packs per day. She has never used smokeless tobacco. She reports that she does not currently use alcohol. She reports that she does not use drugs.   Family History:  Her family history includes Cancer in her father; Diabetes in her son and another family member; Hypertension in her father, mother, and another family member; Sickle cell trait in an other family member. There is no history of Other.   Allergies Allergies  Allergen Reactions   Bee Venom Anaphylaxis   Shellfish Allergy Anaphylaxis and Swelling     Home Medications  Prior to Admission medications   Medication Sig Start Date End Date Taking? Authorizing Provider  acetaminophen (TYLENOL) 325 MG tablet Take 2 tablets (650 mg total) by mouth every 6 (six) hours as needed for mild pain or moderate pain. 06/10/15   Kinnie Feil, MD  albuterol (PROVENTIL) (2.5 MG/3ML) 0.083% nebulizer solution Use 3 mLs (2.5 mg total) by nebulization every 6 (six) hours as needed for wheezing or shortness of breath. 05/16/22   Argentina Donovan, PA-C  albuterol (VENTOLIN HFA) 108 (90 Base) MCG/ACT inhaler INHALE 2 PUFFS INTO THE LUNGS EVERY 6 (SIX) HOURS AS NEEDED FOR WHEEZING OR SHORTNESS OF BREATH. 02/06/22   Gildardo Pounds, NP  amLODipine (NORVASC) 10 MG tablet Take 1 tablet (10 mg total) by mouth daily. 05/16/22   Argentina Donovan, PA-C  atorvastatin (LIPITOR) 20 MG tablet Take 1  tablet (20 mg total) by mouth daily. 05/16/22   Argentina Donovan, PA-C  fluticasone furoate-vilanterol (BREO ELLIPTA) 100-25 MCG/ACT AEPB Inhale 1 puff into the lungs daily. 05/25/22   Charlott Rakes, MD  metFORMIN (GLUCOPHAGE) 500 MG tablet Take 1 tablet (500 mg total) by mouth daily with breakfast. 05/16/22 06/24/22  Argentina Donovan, PA-C  ondansetron (ZOFRAN ODT) 4 MG disintegrating tablet Take 1 tablet (4 mg total) by mouth every 8 (eight) hours as needed for nausea or vomiting. 08/22/21   Mayers, Cari S, PA-C  pantoprazole (PROTONIX) 20 MG tablet Take 1 tablet (20 mg total) by mouth daily. 02/06/22   Gildardo Pounds, NP  sertraline (ZOLOFT) 50 MG tablet Take 1 tablet (50 mg total) by mouth daily. 05/16/22   Argentina Donovan, PA-C  traMADol (ULTRAM) 50 MG tablet Take 1 tablet (50 mg total) by mouth every 6 (six) hours as needed. 05/16/22   Argentina Donovan, PA-C  traMADol (ULTRAM) 50 MG tablet Take 1 tablet by mouth every 6 hours  as needed. 05/16/22   Argentina Donovan, PA-C  TRUEplus Lancets 28G MISC USE AS DIRECTED TWICE DAILY 03/10/20   Charlott Rakes, MD     This patient is critically ill with multiple organ system failure; which, requires frequent high complexity decision making, assessment, support, evaluation, and titration of therapies. This was completed through the application of advanced monitoring technologies and extensive interpretation of multiple databases. During this encounter critical care time was devoted to patient care services described in this note for 32 minutes.  Garner Nash, DO [facility] 10/05/2022 8:40 AM

## 2022-10-05 NOTE — Progress Notes (Signed)
Spoke with Primary RN, patient is restless at this time. RN currently titrating medication for sedation. Needs 2 people to place PICC line. Will follow up.

## 2022-10-05 NOTE — Progress Notes (Signed)
SLP Cancellation Note  Patient Details Name: Taylor Vazquez MRN: [id number] DOB: 08-20-1966   Cancelled treatment:       Reason Eval/Treat Not Completed: Patient not medically ready (on vent). Will f/u as able.    Osie Bond., M.A. [place] Office [phone]  Secure chat preferred  10/05/2022, 7:59 AM

## 2022-10-05 NOTE — Progress Notes (Signed)
PT Cancellation Note  Patient Details Name: Taylor Vazquez MRN: [id number] DOB: Jun 25, 1966   Cancelled Treatment:    Reason Eval/Treat Not Completed: Active bedrest order; will follow up when orders upgraded.    Reginia Naas 10/05/2022, 9:03 AM Magda Kiel, PT [facility] [phone] 10/05/2022

## 2022-10-06 DIAGNOSIS — I639 Cerebral infarction, unspecified: Secondary | ICD-10-CM

## 2022-10-06 LAB — BASIC METABOLIC PANEL WITH GFR
Anion gap: 6 (ref 5–15)
BUN: 41 mg/dL — ABNORMAL HIGH (ref 6–20)
CO2: 20 mmol/L — ABNORMAL LOW (ref 22–32)
Calcium: 7.8 mg/dL — ABNORMAL LOW (ref 8.9–10.3)
Chloride: 128 mmol/L — ABNORMAL HIGH (ref 98–111)
Creatinine, Ser: 1.97 mg/dL — ABNORMAL HIGH (ref 0.44–1.00)
GFR, Estimated: 30 mL/min — ABNORMAL LOW
Glucose, Bld: 127 mg/dL — ABNORMAL HIGH (ref 70–99)
Potassium: 4.1 mmol/L (ref 3.5–5.1)
Sodium: 154 mmol/L — ABNORMAL HIGH (ref 135–145)

## 2022-10-06 LAB — GLUCOSE, CAPILLARY
Glucose-Capillary: 137 mg/dL — ABNORMAL HIGH (ref 70–99)
Glucose-Capillary: 128 mg/dL — ABNORMAL HIGH (ref 70–99)
Glucose-Capillary: 133 mg/dL — ABNORMAL HIGH (ref 70–99)
Glucose-Capillary: 121 mg/dL — ABNORMAL HIGH (ref 70–99)
Glucose-Capillary: 106 mg/dL — ABNORMAL HIGH (ref 70–99)
Glucose-Capillary: 149 mg/dL — ABNORMAL HIGH (ref 70–99)

## 2022-10-06 LAB — PHOSPHORUS
Phosphorus: 4.5 mg/dL (ref 2.5–4.6)
Phosphorus: 3.2 mg/dL (ref 2.5–4.6)

## 2022-10-06 LAB — MAGNESIUM
Magnesium: 2.7 mg/dL — ABNORMAL HIGH (ref 1.7–2.4)
Magnesium: 2.5 mg/dL — ABNORMAL HIGH (ref 1.7–2.4)

## 2022-10-06 LAB — CBC
HCT: 40.7 % (ref 36.0–46.0)
Hemoglobin: 13.4 g/dL (ref 12.0–15.0)
MCH: 29.9 pg (ref 26.0–34.0)
MCHC: 32.9 g/dL (ref 30.0–36.0)
MCV: 90.8 fL (ref 80.0–100.0)
Platelets: 163 10*3/uL (ref 150–400)
RBC: 4.48 MIL/uL (ref 3.87–5.11)
RDW: 14.3 % (ref 11.5–15.5)
WBC: 11.6 10*3/uL — ABNORMAL HIGH (ref 4.0–10.5)
nRBC: 0 % (ref 0.0–0.2)

## 2022-10-06 LAB — HEMOGLOBIN A1C
Hgb A1c MFr Bld: 5.6 % (ref 4.8–5.6)
Mean Plasma Glucose: 114 mg/dL

## 2022-10-06 LAB — SODIUM
Sodium: 154 mmol/L — ABNORMAL HIGH (ref 135–145)
Sodium: 148 mmol/L — ABNORMAL HIGH (ref 135–145)
Sodium: 162 mmol/L (ref 135–145)

## 2022-10-06 LAB — TRIGLYCERIDES: Triglycerides: 185 mg/dL — ABNORMAL HIGH

## 2022-10-06 MED ORDER — LABETALOL HCL 5 MG/ML IV SOLN
20.0000 mg | INTRAVENOUS | Status: DC | PRN
  Administered 2022-10-06 – 2022-10-10 (×8): 20 mg via INTRAVENOUS
  Filled 2022-10-06 (×8): qty 4

## 2022-10-06 MED ORDER — FENTANYL 2500MCG IN NS 250ML (10MCG/ML) PREMIX INFUSION
0.0000 ug/h | INTRAVENOUS | Status: DC
  Administered 2022-10-06: 25 ug/h via INTRAVENOUS
  Administered 2022-10-07: 75 ug/h via INTRAVENOUS
  Administered 2022-10-07: 50 ug/h via INTRAVENOUS
  Administered 2022-10-09 – 2022-10-10 (×2): 100 ug/h via INTRAVENOUS
  Administered 2022-10-11: 150 ug/h via INTRAVENOUS
  Administered 2022-10-12: 125 ug/h via INTRAVENOUS
  Administered 2022-10-12: 75 ug/h via INTRAVENOUS
  Administered 2022-10-12: 125 ug/h via INTRAVENOUS
  Administered 2022-10-13 – 2022-10-14 (×3): 150 ug/h via INTRAVENOUS
  Administered 2022-10-15 (×2): 125 ug/h via INTRAVENOUS
  Administered 2022-10-16 – 2022-10-19 (×10): 400 ug/h via INTRAVENOUS
  Filled 2022-10-06 (×22): qty 250

## 2022-10-06 MED ORDER — AMLODIPINE BESYLATE 10 MG PO TABS
10.0000 mg | ORAL_TABLET | Freq: Every day | ORAL | Status: DC
  Administered 2022-10-06 – 2022-10-14 (×9): 10 mg
  Filled 2022-10-06 (×8): qty 1

## 2022-10-06 MED ORDER — AMLODIPINE BESYLATE 10 MG PO TABS
10.0000 mg | ORAL_TABLET | Freq: Every day | ORAL | Status: DC
  Filled 2022-10-06: qty 1

## 2022-10-06 NOTE — Plan of Care (Signed)
Na 151 Change 3% to 25cc/hr  -- Amie Portland, MD Neurologist [facility] Pager: [phone]

## 2022-10-06 NOTE — Progress Notes (Signed)
OT Cancellation Note  Patient Details Name: Taylor Vazquez MRN: [id number] DOB: [birth year]   Cancelled Treatment:    Reason Eval/Treat Not Completed: Patient not medically ready (intubated/sedated). OT will continue to follow for medical appropriateness and evaluate when activity orders updated.  [place] 10/06/2022, 9:09 AM  Jesse Sans OTR/L [place] Office: [phone]

## 2022-10-06 NOTE — Plan of Care (Signed)
Patient received on [facility] settings as charted. PSV 8/5 started at 0820. Patient is tolerating weaning trial well and remains on these settings at this time.

## 2022-10-06 NOTE — Progress Notes (Signed)
PT Cancellation Note  Patient Details Name: Taylor Vazquez MRN: [id number] DOB: May 31, 1966   Cancelled Treatment:    Reason Eval/Treat Not Completed: Patient not medically ready. Pt remains intubated/sedated. PT to continue to follow.    Lorriane Shire 10/06/2022, 9:18 AM

## 2022-10-06 NOTE — Progress Notes (Signed)
Pt transferred to and from MRI with Vent without complication.

## 2022-10-06 NOTE — Progress Notes (Signed)
Patient ID: ASANTE RITACCO, female   DOB: Nov 19, 1965, 56 y.o.   MRN: [id number]  56 y.o. F with PMH significant for asthma, depression, ETOH use, HTN, Sickle cell trait , HFpEF who presented to the ED after being found by her daughter with flaccid L side and R gaze deviation.  Admitted as a code stroke.  She was initially hypertensive 200/100's-> got TNK.  Imaging showed R MCA M2 occlusion->   Mechanical thrombectomy was performed with TICI 2A with temporary recanalization, -> had  subsequent re-occlusion.  Repeat imaging with contrast extravasation and likely SAH.  She was left intubated   E-Link called due to agitation on vent   Plans:  Continue mechanical ventilation post op  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  Complicated by Post-procedure SAH W/ right anterior frontal and temporal cortex, right basal ganglia ischemia Cytotoxic cerebral edema     -> Start Fentanyl infusion for agitation on vent, need to prevent ICP rise  -> low threshold to start paralytic if fentanyl infusion fails

## 2022-10-06 NOTE — Progress Notes (Addendum)
STROKE TEAM PROGRESS NOTE   INTERVAL HISTORY Her family is at the bedside.  Remains intubated and sedated with fentanyl at 5mg/hr and propofol at 40. 3% at 24mhr MRI scan of the brain yesterday showed large right posterior MCA territory infarct with trace leftward midline shift.  Serum sodium is 154.  Plan to repeat CT head tomorrow morning.  Vital signs stable.  Neurological exam limited by sedation  Vitals:   10/06/22 0500 10/06/22 0600 10/06/22 0700 10/06/22 0800  BP: 138/86 (!) 145/94 124/82 (!) 156/90  Pulse: 89 90 89 88  Resp: '13 12 12 13  '$ Temp:      TempSrc:      SpO2: 97% 98% 94% 98%  Weight: 90.4 kg     Height:       CBC:  Recent Labs  Lab 09/28/2022 1945 09/22/2022 1946 10/05/22 0510 10/06/22 0546  WBC 8.8  --  5.8 11.6*  NEUTROABS 3.8  --   --   --   HGB 16.5*   < > 17.4* 13.4  HCT 46.9*   < > 50.2* 40.7  MCV 87.2  --  87.9 90.8  PLT 178  --  198 163   < > = values in this interval not displayed.    Basic Metabolic Panel:  Recent Labs  Lab 10/05/22 0510 10/05/22 0916 10/05/22 1048 10/05/22 1647 10/05/22 2318 10/06/22 0546  NA 139  --    < > 145 151* 154*  154*  K 3.9  --   --   --   --  4.1  CL 104  --   --   --   --  128*  CO2 21*  --   --   --   --  20*  GLUCOSE 129*  --   --   --   --  127*  BUN 27*  --   --   --   --  41*  CREATININE 1.79*  --   --   --   --  1.97*  CALCIUM 8.7*  --   --   --   --  7.8*  MG  --  2.1  --  2.2  --   --   PHOS  --  4.2  --  3.5  --   --    < > = values in this interval not displayed.    Lipid Panel:  Recent Labs  Lab 10/05/22 0510 10/06/22 0546  CHOL 252*  --   TRIG 126 185*  HDL 53  --   CHOLHDL 4.8  --   VLDL 25  --   LDLCALC 174*  --     HgbA1c:  Recent Labs  Lab 10/05/22 0510  HGBA1C 5.6   Urine Drug Screen:  Recent Labs  Lab 10/05/22 1749  LABOPIA NONE DETECTED  COCAINSCRNUR POSITIVE*  LABBENZ NONE DETECTED  AMPHETMU NONE DETECTED  THCU NONE DETECTED  LABBARB NONE DETECTED     Alcohol Level  Recent Labs  Lab 10/09/2022 1939  ETH <10     IMAGING past 24 hours MR BRAIN WO CONTRAST  Result Date: 10/05/2022 CLINICAL DATA:  Stroke follow-up EXAM: MRI HEAD WITHOUT CONTRAST MRA HEAD WITHOUT CONTRAST TECHNIQUE: Multiplanar, multi-echo pulse sequences of the brain and surrounding structures were acquired without intravenous contrast. Angiographic images of the Circle of Willis were acquired using MRA technique without intravenous contrast. COMPARISON:  None Available. FINDINGS: MRI HEAD FINDINGS Brain: Large area early subacute ischemia throughout the posterior right  MCA territory. There is mass effect on the right lateral ventricle with trace leftward midline shift. No acute or chronic hemorrhage. Normal white matter signal, parenchymal volume and CSF spaces. The midline structures are normal. Vascular: Major flow voids are preserved. Skull and upper cervical spine: Normal calvarium and skull base. Visualized upper cervical spine and soft tissues are normal. Sinuses/Orbits:No paranasal sinus fluid levels or advanced mucosal thickening. No mastoid or middle ear effusion. Normal orbits. MRA HEAD FINDINGS POSTERIOR CIRCULATION: --Vertebral arteries: Normal --Inferior cerebellar arteries: Normal. --Basilar artery: Normal. --Superior cerebellar arteries: Normal. --Posterior cerebral arteries: Normal. ANTERIOR CIRCULATION: --Intracranial internal carotid arteries: Normal. --Anterior cerebral arteries (ACA): Normal. --Middle cerebral arteries (MCA): There is no flow related enhancement seen in the right MCA M2 branches aside from the most anterior part of the superior division, unchanged from yesterday's CTA. Left MCA normal. ANATOMIC VARIANTS: None IMPRESSION: 1. Large area of early subacute ischemia throughout the posterior right MCA territory. Mass effect on the right lateral ventricle with trace leftward midline shift. 2. No flow related enhancement seen in the right MCA M2 branches  aside from the most anterior part of the superior division, unchanged from yesterday's CTA. Electronically Signed   By: Ulyses Jarred M.D.   On: 10/05/2022 21:31   MR ANGIO HEAD WO CONTRAST  Result Date: 10/05/2022 CLINICAL DATA:  Stroke follow-up EXAM: MRI HEAD WITHOUT CONTRAST MRA HEAD WITHOUT CONTRAST TECHNIQUE: Multiplanar, multi-echo pulse sequences of the brain and surrounding structures were acquired without intravenous contrast. Angiographic images of the Circle of Willis were acquired using MRA technique without intravenous contrast. COMPARISON:  None Available. FINDINGS: MRI HEAD FINDINGS Brain: Large area early subacute ischemia throughout the posterior right MCA territory. There is mass effect on the right lateral ventricle with trace leftward midline shift. No acute or chronic hemorrhage. Normal white matter signal, parenchymal volume and CSF spaces. The midline structures are normal. Vascular: Major flow voids are preserved. Skull and upper cervical spine: Normal calvarium and skull base. Visualized upper cervical spine and soft tissues are normal. Sinuses/Orbits:No paranasal sinus fluid levels or advanced mucosal thickening. No mastoid or middle ear effusion. Normal orbits. MRA HEAD FINDINGS POSTERIOR CIRCULATION: --Vertebral arteries: Normal --Inferior cerebellar arteries: Normal. --Basilar artery: Normal. --Superior cerebellar arteries: Normal. --Posterior cerebral arteries: Normal. ANTERIOR CIRCULATION: --Intracranial internal carotid arteries: Normal. --Anterior cerebral arteries (ACA): Normal. --Middle cerebral arteries (MCA): There is no flow related enhancement seen in the right MCA M2 branches aside from the most anterior part of the superior division, unchanged from yesterday's CTA. Left MCA normal. ANATOMIC VARIANTS: None IMPRESSION: 1. Large area of early subacute ischemia throughout the posterior right MCA territory. Mass effect on the right lateral ventricle with trace leftward  midline shift. 2. No flow related enhancement seen in the right MCA M2 branches aside from the most anterior part of the superior division, unchanged from yesterday's CTA. Electronically Signed   By: Ulyses Jarred M.D.   On: 10/05/2022 21:31   ECHOCARDIOGRAM COMPLETE  Result Date: 10/05/2022    ECHOCARDIOGRAM REPORT   Patient Name:   AVEYAH GREENWOOD [facility] Date of Exam: 10/05/2022 Medical Rec #:  [id number]          Height:       63.0 in Accession #:    [id number]         Weight:       226.2 lb Date of Birth:  03/09/1966          BSA:  2.037 m Patient Age:    66 years           BP:           148/80 mmHg Patient Gender: F                  HR:           95 bpm. Exam Location:  Inpatient Procedure: 2D Echo, Color Doppler and Cardiac Doppler Indications:    Stroke  History:        Patient has prior history of Echocardiogram examinations, most                 recent 06/25/2017. CHF, Stroke; Risk Factors:Hypertension and                 Obesity. Cocaine and ETOH, sickle cell.  Sonographer:    Eartha Inch Referring Phys: [id number] ASHISH ARORA  Sonographer Comments: Echo performed with patient supine and on artificial respirator. Image acquisition challenging due to patient body habitus and Image acquisition challenging due to respiratory motion. IMPRESSIONS  1. Left ventricular ejection fraction, by estimation, is 60 to 65%. The left ventricle has normal function. The left ventricle has no regional wall motion abnormalities. There is moderate left ventricular hypertrophy. Left ventricular diastolic parameters are consistent with Grade II diastolic dysfunction (pseudonormalization). Elevated left atrial pressure.  2. Right ventricular systolic function is normal. The right ventricular size is normal. Tricuspid regurgitation signal is inadequate for assessing PA pressure.  3. Left atrial size was mildly dilated.  4. The mitral valve is normal in structure. No evidence of mitral valve regurgitation. No evidence of  mitral stenosis.  5. The aortic valve is tricuspid. Aortic valve regurgitation is not visualized. No aortic stenosis is present.  6. The inferior vena cava is normal in size with <50% respiratory variability, suggesting right atrial pressure of 8 mmHg. FINDINGS  Left Ventricle: Left ventricular ejection fraction, by estimation, is 60 to 65%. The left ventricle has normal function. The left ventricle has no regional wall motion abnormalities. The left ventricular internal cavity size was normal in size. There is  moderate left ventricular hypertrophy. Left ventricular diastolic parameters are consistent with Grade II diastolic dysfunction (pseudonormalization). Elevated left atrial pressure. Right Ventricle: The right ventricular size is normal. No increase in right ventricular wall thickness. Right ventricular systolic function is normal. Tricuspid regurgitation signal is inadequate for assessing PA pressure. Left Atrium: Left atrial size was mildly dilated. Right Atrium: Right atrial size was normal in size. Pericardium: Trivial pericardial effusion is present. Mitral Valve: The mitral valve is normal in structure. No evidence of mitral valve regurgitation. No evidence of mitral valve stenosis. Tricuspid Valve: The tricuspid valve is normal in structure. Tricuspid valve regurgitation is trivial. Aortic Valve: The aortic valve is tricuspid. Aortic valve regurgitation is not visualized. No aortic stenosis is present. Pulmonic Valve: The pulmonic valve was not well visualized. Pulmonic valve regurgitation is not visualized. Aorta: The aortic root is normal in size and structure. Venous: The inferior vena cava is normal in size with less than 50% respiratory variability, suggesting right atrial pressure of 8 mmHg. IAS/Shunts: The interatrial septum was not well visualized.  LEFT VENTRICLE PLAX 2D LVIDd:         4.60 cm   Diastology LVIDs:         2.80 cm   LV e' medial:    5.00 cm/s LV PW:         1.20 cm  LV E/e'  medial:  15.3 LV IVS:        1.30 cm   LV e' lateral:   5.22 cm/s LVOT diam:     2.30 cm   LV E/e' lateral: 14.7 LV SV:         103 LV SV Index:   51 LVOT Area:     4.15 cm  RIGHT VENTRICLE             IVC RV S prime:     15.70 cm/s  IVC diam: 2.00 cm TAPSE (M-mode): 2.1 cm LEFT ATRIUM             Index        RIGHT ATRIUM           Index LA diam:        3.80 cm 1.87 cm/m   RA Area:     11.10 cm LA Vol (A2C):   57.1 ml 28.02 ml/m  RA Volume:   21.50 ml  10.55 ml/m LA Vol (A4C):   77.6 ml 38.09 ml/m LA Biplane Vol: 68.8 ml 33.77 ml/m  AORTIC VALVE LVOT Vmax:   123.00 cm/s LVOT Vmean:  77.700 cm/s LVOT VTI:    0.249 m  AORTA Ao Root diam: 3.30 cm MITRAL VALVE MV Area (PHT): 3.65 cm    SHUNTS MV Decel Time: 208 msec    Systemic VTI:  0.25 m MV E velocity: 76.70 cm/s  Systemic Diam: 2.30 cm MV A velocity: 89.40 cm/s MV E/A ratio:  0.86 Oswaldo Milian MD Electronically signed by Oswaldo Milian MD Signature Date/Time: 10/05/2022/12:08:42 PM    Final    [place] EKG SITE RITE  Result Date: 10/05/2022 If Site Rite image not attached, placement could not be confirmed due to current cardiac rhythm.  DG Abd Portable 1V  Result Date: 10/05/2022 CLINICAL DATA:  Feeding tube placement. EXAM: PORTABLE ABDOMEN - 1 VIEW COMPARISON:  None Available. FINDINGS: Distal tip of feeding tube is seen in expected position of distal stomach. No abnormal bowel dilatation is noted. IMPRESSION: Distal tip of feeding tube is seen in expected position of distal stomach. Electronically Signed   By: Marijo Conception M.D.   On: 10/05/2022 10:55    PHYSICAL EXAM  Physical Exam  Constitutional: Appears well-developed and well-nourished.  Middle-aged African-American lady who is intubated and sedated Cardiovascular: Normal rate and regular rhythm.  Respiratory: Mechanically ventilated  Neuro: Mental Status: Intubated, sedated, opens eyes to noxious stimuli.  Follows occasional commands on the right side. Cranial  Nerves: II: Pupils are equal and sluggish III,IV, VI: Eyes are midline X: Cough and gag intact XI: Head is turned to the right Motor: Tone is normal. Bulk is normal. Spontaneous movement noted in right upper extremity Movement with painful stimuli in right lower extremity and left upper extremity Flicker of movement noted in left lower extremity Sensory: Localizes to pain in bilateral upper extremities and right lower extremity, no reaction to painful stimuli in left lower extremity  Patient still intubated, on vent, eyes halfway open with voice, not follow commands.  With forced eye opening, eye midline, PERRL, no doll's eyes, positive with gag cough and corneal reflexes.  Has spontaneous movement of right arm and right leg with right arm able to against gravity.  No movement of left upper extremity on pain stimulation, slight withdrawal of left lower extremity on pain stimulation.  Sensation, coordination and gait not tested.  ASSESSMENT/PLAN Ms. Maddie Brazier Balinski is a 56 y.o.  female with history of CHF, hypertension, obesity, sickle cell trait, prior stroke, cocaine use, presenting for evaluation of sudden onset of left-sided weakness with weakness last known well at 6 PM.  She was given TNK and emergently taken for a mechanical thrombectomy of her M2 MCA posterior division branch.  They achieved temporary recanalization with subsequent reocclusion.  Flat panel CT showed intense contrast blushing and edema of the right frontal and anterior temporal cortex and basal ganglia, suggesting ongoing ischemia . A small right Sylvian hyperdensity is also seen, suggesting contrast extravasation with likely associated SAH.  She returned to ICU intubated.  MRI showed right MCA large infarct.  MRA showed right M2 persistent occlusion.   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 ASPECTS is 8.   CTA head & neck Right MCA M2 segment occlusion with poor collateralization. S/p IR with temporary recanalization with subsequent reocclusion (TICI 2A)  CT Head- Right perisylvian SAH/contrast has diffused but not clearly increased. Continue contrast blush extensively within right anterior frontal and temporal cortex and in the right basal ganglia ischemia, ACA and MCA territory is involved. Additional cytotoxic edema in the lateral and high right frontal cortex. MRI Brain- Large area of early subacute ischemia throughout the posterior right MCA territory. Mass effect on the right lateral ventricle with trace leftward midline shift. MRA Brain-  No flow related enhancement seen in the right MCA M2 branches aside from the most anterior part of the superior division, unchanged from yesterday's CTA. 2D Echo EF 60-65%  LDL 174 HgbA1c 5.5 UDS cocaine positive VTE prophylaxis - heparin subq No antithrombotic prior to admission, now on ASA 325 Therapy recommendations: Pending Disposition: Pending  Cerebral edema with brain compression Hypertonic saline initiated at 58m/hr Sodium Q6 hr  Na 139 -> 140->145 -> 151 -> 154 PICC line ordered  Hypertension Home meds: Amlodipine Stable IV Cleviprex infusing BP goal 130-150  Hyperlipidemia Home meds: Atorvastatin 20 mg LDL 174, goal < 70 Put on crestor 40 Continue statin at discharge  Acute Hypoxic respiratory failure  CCM on board Remains intubated post procedure Wean to extubate per CCM team  Cocaine abuse Hx of cocaine use UDS positive for cocaine this time Cessation education will be provided.   Tobacco abuse Current smoker Smoking cessation counseling will be provided  Alcohol abuse Per daughter, she is drinking frequently Not compliant with medication with drinking CIWA protocol   Other Stroke Risk Factors Obesity, Body mass index is 35.3 kg/m., BMI >/= 30 associated with increased stroke risk, recommend weight loss, diet  and exercise as appropriate  Hx of stroke Congestive heart failure  Other Active Problems Depression Sertraline 50 mg Hypokalemia Repleted, K now 3.9  Hospital day # 2  Patient seen and examined by NP/APP with MD. MD to update note as needed.   DJanine Ores DNP, FNP-BC [facility] Pager: [phone] I have personally obtained history,examined this patient, reviewed notes, independently viewed imaging studies, participated in medical decision making and plan of care.ROS completed by me personally and pertinent positives fully documented  I have made any additions or clarifications directly to the above note. Agree with note above.  Continue ventilatory support for respiratory failure and wean off ventilator as tolerated.  Repeat CT head tomorrow morning.  Reduce sedation as tolerated and consider extubation in the next few days.  Long discussion with patient and family at the  bedside and answered questions.  Discussed with Dr. Valeta Harms critical care medicine.This patient is critically ill and at significant risk of neurological worsening, death and care requires constant monitoring of vital signs, hemodynamics,respiratory and cardiac monitoring, extensive review of multiple databases, frequent neurological assessment, discussion with family, other specialists and medical decision making of high complexity.I have made any additions or clarifications directly to the above note.This critical care time does not reflect procedure time, or teaching time or supervisory time of PA/NP/Med Resident etc but could involve care discussion time.  I spent 30 minutes of neurocritical care time  in the care of  this patient.      Antony Contras, MD Medical Director [facility] Pager: [phone] 10/06/2022 3:17 PM     To contact Stroke Continuity provider, please refer to [email or web address] After hours, contact General Neurology

## 2022-10-06 NOTE — Progress Notes (Signed)
NAME:  Taylor Vazquez, MRN:  [id number], DOB:  January 09, 1966, LOS: 2 ADMISSION DATE:  10/02/2022, CONSULTATION DATE:  10/06/22 REFERRING MD: Rory Percy  , CHIEF COMPLAINT:  Code stroke   History of Present Illness:  Taylor Vazquez is a 56 y.o. F with PMH significant for asthma, depression, ETOH use, HTN, Sickle cell trait , HFpEF who presented to the ED after being found by her daughter with flaccid L side and R gaze deviation.  Her LKW was 1830 12/14 found at 1925.  She had a normal glucose and was brought to the ED as a code stroke.  She was initially hypertensive 200/100's.  The decision was made to administer TNK.  Imaging showed R MCA M2 occlusion and so interventional radiology consulted.  Pt was combative in the ED requiring ativan.  Labs were significant for K 3.2, Creatinine 1.6.    Mechanical thrombectomy was performed with TICI 2A with temporary recanalization, but then subsequent re-occlusion.  Repeat imaging with contrast extravasation and likely SAH.  She was left intubated and PCCM consulted.   Pertinent  Medical History   has a past medical history of Asthma, CHF (congestive heart failure) ([place]), Depression, Hypertension, Obesity, Sickle cell trait ([place]), and Stroke ([place]).   Significant Hospital Events: Including procedures, antibiotic start and stop dates in addition to other pertinent events   12/14 presented as a code stroke, R MCA M2 occlusion, taken to IR, possible SAH post-procedure and left intubated 12/15 MRI completed   Interim History / Subjective:   Patient remains critically ill intubated on mechanical life support.  MRI completed last night.  Has large area of early subacute ischemia in the posterior right MCA territory with mass effect and right lateral ventricle with left inward midline shift.  Objective   Blood pressure (!) 156/90, pulse 88, temperature 98.8 F (37.1 C), temperature source Axillary, resp. rate 13, height '5\' 3"'$  (1.6 m), weight 90.4 kg, SpO2 98  %.    Vent Mode: PRVC FiO2 (%):  [40 %] 40 % Set Rate:  [18 bmp] 18 bmp Vt Set:  [450 mL] 450 mL PEEP:  [5 cmH20] 5 cmH20 Plateau Pressure:  [15 cmH20-17 cmH20] 15 cmH20   Intake/Output Summary (Last 24 hours) at 10/06/2022 5329 Last data filed at 10/06/2022 0800 Gross per 24 hour  Intake 3033.99 ml  Output 1925 ml  Net 1108.99 ml   Filed Weights   10/12/2022 1900 10/06/22 0500  Weight: 102.6 kg 90.4 kg    General: Critically ill intubated on mechanical life support HEENT: Endotracheal tube in place Neuro: Sedated on propofol and fentanyl CV: Regular rate rhythm, S1-S2 PULM: Bilateral mechanically ventilated breath sounds GI: Soft, nontender nondistended Extremities: No significant edema Skin: No rash   Resolved Hospital Problem list     Assessment & Plan:   Post-op mechanical ventilation management 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  Complicated by Post-procedure Perisylvian SAH W/ right anterior frontal and temporal cortex, right basal ganglia ischemia Cytotoxic cerebral edema high lateral frontal cortex S/p thrombectomy with TICI 2a recanalization and stent retriever and aspiration but with subsequent re-occlusion and concern for SAH, left intubated P: Post stroke mtg per stroke team  MRI completed PAD guidelines sedation, added fentanyl plus propofol Cleviprex for systolic blood pressure goal 120-140 Mental status precludes liberation from ventilator and limits SAT SBT Continue full vent support VAP prophylaxis Head of bed bili elevated 3% saline per neuro was every 6 hours sodium checks.  CKD stage 3a HTN Creatinine  near baseline at 1.5 -Home BP meds holding, Cleviprex for pressure control.  Type 2 DM -Holding metformin, continue SSI  Hypokalemia  -Replete as needed  Tube feeds -Continue tube feeds   Best Practice (right click and "Reselect all SmartList Selections" daily)   Diet/type: tubefeeds DVT prophylaxis: SCD GI prophylaxis:  PPI Lines: N/A Foley:  Yes, and it is still needed Code Status:  full code Last date of multidisciplinary goals of care discussion [ I updated patients family at bedside, sister and daughter were present]  Labs   CBC: Recent Labs  Lab 10/03/2022 1945 10/07/2022 1946 10/05/22 0510 10/06/22 0546  WBC 8.8  --  5.8 11.6*  NEUTROABS 3.8  --   --   --   HGB 16.5* 17.0* 17.4* 13.4  HCT 46.9* 50.0* 50.2* 40.7  MCV 87.2  --  87.9 90.8  PLT 178  --  198 449    Basic Metabolic Panel: Recent Labs  Lab 09/22/2022 1945 09/21/2022 1946 10/05/22 0510 10/05/22 0916 10/05/22 1048 10/05/22 1647 10/05/22 2318 10/06/22 0546  NA 138 138 139  --  140 145 151* 154*  154*  K 3.2* 3.2* 3.9  --   --   --   --  4.1  CL 106 106 104  --   --   --   --  128*  CO2 19*  --  21*  --   --   --   --  20*  GLUCOSE 104* 104* 129*  --   --   --   --  127*  BUN 24* 28* 27*  --   --   --   --  41*  CREATININE 1.51* 1.60* 1.79*  --   --   --   --  1.97*  CALCIUM 8.6*  --  8.7*  --   --   --   --  7.8*  MG  --   --   --  2.1  --  2.2  --  2.7*  PHOS  --   --   --  4.2  --  3.5  --  4.5   GFR: Estimated Creatinine Clearance: 34.8 mL/min (A) (by C-G formula based on SCr of 1.97 mg/dL (H)). Recent Labs  Lab 10/15/2022 1945 10/05/22 0510 10/06/22 0546  WBC 8.8 5.8 11.6*    Liver Function Tests: Recent Labs  Lab 10/20/2022 1945  AST 22  ALT 18  ALKPHOS 126  BILITOT 0.5  PROT 7.1  ALBUMIN 3.4*   No results for input(s): "LIPASE", "AMYLASE" in the last 168 hours. No results for input(s): "AMMONIA" in the last 168 hours.  ABG    Component Value Date/Time   PHART 7.35 10/21/2022 2320   PCO2ART 39 09/26/2022 2320   PO2ART 76 (L) 09/27/2022 2320   HCO3 21.6 09/29/2022 2320   TCO2 21 (L) 10/07/2022 1946   ACIDBASEDEF 3.9 (H) 10/10/2022 2320   O2SAT 96.5 10/16/2022 2320     Coagulation Profile: Recent Labs  Lab 10/06/2022 1945  INR 1.1    Cardiac Enzymes: No results for input(s): "CKTOTAL",  "CKMB", "CKMBINDEX", "TROPONINI" in the last 168 hours.  HbA1C: HbA1c, POC (prediabetic range)  Date/Time Value Ref Range Status  07/24/2018 10:57 AM 5.8 5.7 - 6.4 % Final   HbA1c, POC (controlled diabetic range)  Date/Time Value Ref Range Status  12/31/2018 03:31 PM 5.4 0.0 - 7.0 % Final  07/24/2018 10:57 AM 5.8 0.0 - 7.0 % Final   HbA1c POC (<> result,  manual entry)  Date/Time Value Ref Range Status  07/24/2018 10:57 AM 5.8 4.0 - 5.6 % Final   Hgb A1c MFr Bld  Date/Time Value Ref Range Status  10/05/2022 05:10 AM 5.6 4.8 - 5.6 % Final    Comment:    (NOTE)         Prediabetes: 5.7 - 6.4         Diabetes: >6.4         Glycemic control for adults with diabetes: <7.0   05/16/2022 02:45 PM 5.5 4.8 - 5.6 % Final    Comment:             Prediabetes: 5.7 - 6.4          Diabetes: >6.4          Glycemic control for adults with diabetes: <7.0     CBG: Recent Labs  Lab 10/05/22 1555 10/05/22 1916 10/05/22 2304 10/06/22 0304 10/06/22 0732  GLUCAP 124* 133* 145* 137* 128*    Review of Systems:   Unable to obtain  Past Medical History:  She,  has a past medical history of Asthma, CHF (congestive heart failure) ([place]), Depression, Hypertension, Obesity, Sickle cell trait ([place]), and Stroke ([place]).   Surgical History:   Past Surgical History:  Procedure Laterality Date   IR CT HEAD LTD  09/22/2022   IR PERCUTANEOUS ART THROMBECTOMY/INFUSION INTRACRANIAL INC DIAG ANGIO  10/03/2022   IR US GUIDE VASC ACCESS RIGHT  09/22/2022   NO PAST SURGERIES     RADIOLOGY WITH ANESTHESIA N/A 09/25/2022   Procedure: IR WITH ANESTHESIA;  Surgeon: Luanne Bras, MD;  Location: [place] [facility];  Service: Radiology;  Laterality: N/A;     Social History:   reports that she has been smoking cigarettes. She has been smoking an average of .25 packs per day. She has never used smokeless tobacco. She reports that she does not currently use alcohol. She reports that she does not use drugs.   Family  History:  Her family history includes Cancer in her father; Diabetes in her son and another family member; Hypertension in her father, mother, and another family member; Sickle cell trait in an other family member. There is no history of Other.   Allergies Allergies  Allergen Reactions   Bee Venom Anaphylaxis   Shellfish Allergy Anaphylaxis and Swelling     Home Medications  Prior to Admission medications   Medication Sig Start Date End Date Taking? Authorizing Provider  acetaminophen (TYLENOL) 325 MG tablet Take 2 tablets (650 mg total) by mouth every 6 (six) hours as needed for mild pain or moderate pain. 06/10/15   Kinnie Feil, MD  albuterol (PROVENTIL) (2.5 MG/3ML) 0.083% nebulizer solution Use 3 mLs (2.5 mg total) by nebulization every 6 (six) hours as needed for wheezing or shortness of breath. 05/16/22   Argentina Donovan, PA-C  albuterol (VENTOLIN HFA) 108 (90 Base) MCG/ACT inhaler INHALE 2 PUFFS INTO THE LUNGS EVERY 6 (SIX) HOURS AS NEEDED FOR WHEEZING OR SHORTNESS OF BREATH. 02/06/22   Gildardo Pounds, NP  amLODipine (NORVASC) 10 MG tablet Take 1 tablet (10 mg total) by mouth daily. 05/16/22   Argentina Donovan, PA-C  atorvastatin (LIPITOR) 20 MG tablet Take 1 tablet (20 mg total) by mouth daily. 05/16/22   Argentina Donovan, PA-C  fluticasone furoate-vilanterol (BREO ELLIPTA) 100-25 MCG/ACT AEPB Inhale 1 puff into the lungs daily. 05/25/22   Charlott Rakes, MD  metFORMIN (GLUCOPHAGE) 500 MG tablet Take 1 tablet (500 mg  total) by mouth daily with breakfast. 05/16/22 06/24/22  Argentina Donovan, PA-C  ondansetron (ZOFRAN ODT) 4 MG disintegrating tablet Take 1 tablet (4 mg total) by mouth every 8 (eight) hours as needed for nausea or vomiting. 08/22/21   Mayers, Cari S, PA-C  pantoprazole (PROTONIX) 20 MG tablet Take 1 tablet (20 mg total) by mouth daily. 02/06/22   Gildardo Pounds, NP  sertraline (ZOLOFT) 50 MG tablet Take 1 tablet (50 mg total) by mouth daily. 05/16/22   Argentina Donovan, PA-C  traMADol (ULTRAM) 50 MG tablet Take 1 tablet (50 mg total) by mouth every 6 (six) hours as needed. 05/16/22   Argentina Donovan, PA-C  traMADol (ULTRAM) 50 MG tablet Take 1 tablet by mouth every 6 hours  as needed. 05/16/22   Argentina Donovan, PA-C  TRUEplus Lancets 28G MISC USE AS DIRECTED TWICE DAILY 03/10/20   Charlott Rakes, MD   This patient is critically ill with multiple organ system failure; which, requires frequent high complexity decision making, assessment, support, evaluation, and titration of therapies. This was completed through the application of advanced monitoring technologies and extensive interpretation of multiple databases. During this encounter critical care time was devoted to patient care services described in this note for 32 minutes.  Garner Nash, DO [facility] 10/06/2022 9:04 AM

## 2022-10-07 ENCOUNTER — Inpatient Hospital Stay (HOSPITAL_COMMUNITY): Payer: Medicaid Other

## 2022-10-07 LAB — CBC
HCT: 37.5 % (ref 36.0–46.0)
Hemoglobin: 12.4 g/dL (ref 12.0–15.0)
MCH: 30.9 pg (ref 26.0–34.0)
MCHC: 33.1 g/dL (ref 30.0–36.0)
MCV: 93.5 fL (ref 80.0–100.0)
Platelets: 119 10*3/uL — ABNORMAL LOW (ref 150–400)
RBC: 4.01 MIL/uL (ref 3.87–5.11)
RDW: 14.8 % (ref 11.5–15.5)
WBC: 15 10*3/uL — ABNORMAL HIGH (ref 4.0–10.5)
nRBC: 0 % (ref 0.0–0.2)

## 2022-10-07 LAB — BASIC METABOLIC PANEL
BUN: 44 mg/dL — ABNORMAL HIGH (ref 6–20)
CO2: 19 mmol/L — ABNORMAL LOW (ref 22–32)
Calcium: 8.2 mg/dL — ABNORMAL LOW (ref 8.9–10.3)
Chloride: >130 mmol/L (ref 98–111)
Creatinine, Ser: 1.98 mg/dL — ABNORMAL HIGH (ref 0.44–1.00)
GFR, Estimated: 29 mL/min — ABNORMAL LOW (ref >60)
Glucose, Bld: 122 mg/dL — ABNORMAL HIGH (ref 70–99)
Potassium: 3.8 mmol/L (ref 3.5–5.1)
Sodium: 158 mmol/L — ABNORMAL HIGH (ref 135–145)

## 2022-10-07 LAB — GLUCOSE, CAPILLARY
Glucose-Capillary: 144 mg/dL — ABNORMAL HIGH (ref 70–99)
Glucose-Capillary: 158 mg/dL — ABNORMAL HIGH (ref 70–99)
Glucose-Capillary: 132 mg/dL — ABNORMAL HIGH (ref 70–99)
Glucose-Capillary: 151 mg/dL — ABNORMAL HIGH (ref 70–99)
Glucose-Capillary: 137 mg/dL — ABNORMAL HIGH (ref 70–99)
Glucose-Capillary: 155 mg/dL — ABNORMAL HIGH (ref 70–99)

## 2022-10-07 MED ORDER — FUROSEMIDE 10 MG/ML IJ SOLN
60.0000 mg | Freq: Once | INTRAMUSCULAR | Status: AC
  Administered 2022-10-07: 60 mg via INTRAVENOUS
  Filled 2022-10-07: qty 6

## 2022-10-07 MED ORDER — SODIUM CHLORIDE 0.9 % IV SOLN
3.0000 g | Freq: Two times a day (BID) | INTRAVENOUS | Status: AC
  Administered 2022-10-07 – 2022-10-12 (×10): 3 g via INTRAVENOUS
  Filled 2022-10-07 (×11): qty 8

## 2022-10-07 MED ORDER — METOLAZONE 5 MG PO TABS
5.0000 mg | ORAL_TABLET | Freq: Once | ORAL | Status: AC
  Administered 2022-10-07: 5 mg
  Filled 2022-10-07: qty 1

## 2022-10-07 MED ORDER — METOLAZONE 5 MG PO TABS
5.0000 mg | ORAL_TABLET | Freq: Once | ORAL | Status: DC
  Filled 2022-10-07: qty 1

## 2022-10-07 NOTE — Progress Notes (Signed)
Pharmacy Antibiotic Note  Taylor Vazquez is a 56 y.o. female admitted on 09/21/2022 with  aspiration pneumonia . WBC 15.0 with Tmax 99.1 (on sched aspirin) with increased secretions and CXR read as fluid overload vs infiltrate. Pharmacy has been consulted for Unasyn dosing.  Plan: Unasyn 3g IV q12h F/u renal function, LOT, fever curve and respiratory culture results   Height: '5\' 3"'$  (160 cm) Weight: 103.3 kg (227 lb 11.8 oz) IBW/kg (Calculated) : 52.4  Temp (24hrs), Avg:99 F (37.2 C), Min:98.1 F (36.7 C), Max:100.2 F (37.9 C)  Recent Labs  Lab 09/24/2022 1945 09/29/2022 1946 10/05/22 0510 10/06/22 0546 10/07/22 0200  WBC 8.8  --  5.8 11.6* 15.0*  CREATININE 1.51* 1.60* 1.79* 1.97* 1.98*    Estimated Creatinine Clearance: 37.3 mL/min (A) (by C-G formula based on SCr of 1.98 mg/dL (H)).    Allergies  Allergen Reactions   Bee Venom Anaphylaxis   Shellfish Allergy Anaphylaxis and Swelling    Antimicrobials this admission: Unasyn 12/17>   Dose adjustments this admission:   Microbiology results: 12/15 MRSA neg 12/17 resp cx: sent   Thank you for allowing pharmacy to be a part of this patient's care.  Wilson Singer, PharmD Clinical Pharmacist 10/07/2022 9:55 AM

## 2022-10-07 NOTE — Plan of Care (Signed)
Patient received on PSV 05/27/39%. Patient has been tolerating PSV since yesterday morning (12/16). When placed back on full ventilator support, patient does not tolerate PRVC. Sputum sample obtained by RN and sent to lab.

## 2022-10-07 NOTE — Progress Notes (Addendum)
NAME:  Taylor Vazquez, MRN:  [id number], DOB:  July 15, 1966, LOS: 3 ADMISSION DATE:  10/15/2022, CONSULTATION DATE:  10/07/22 REFERRING MD: Rory Percy  , CHIEF COMPLAINT:  Code stroke   History of Present Illness:  Taylor Vazquez is a 56 y.o. F with PMH significant for asthma, depression, ETOH use, HTN, Sickle cell trait , HFpEF who presented to the ED after being found by her daughter with flaccid L side and R gaze deviation.  Her LKW was 1830 12/14 found at 1925.  She had a normal glucose and was brought to the ED as a code stroke.  She was initially hypertensive 200/100's.  The decision was made to administer TNK.  Imaging showed R MCA M2 occlusion and so interventional radiology consulted.  Pt was combative in the ED requiring ativan.  Labs were significant for K 3.2, Creatinine 1.6.    Mechanical thrombectomy was performed with TICI 2A with temporary recanalization, but then subsequent re-occlusion.  Repeat imaging with contrast extravasation and likely SAH.  She was left intubated and PCCM consulted.   Pertinent  Medical History   has a past medical history of Asthma, CHF (congestive heart failure) ([place]), Depression, Hypertension, Obesity, Sickle cell trait ([place]), and Stroke ([place]).   Significant Hospital Events: Including procedures, antibiotic start and stop dates in addition to other pertinent events   12/14 presented as a code stroke, R MCA M2 occlusion, taken to IR, possible SAH post-procedure and left intubated 12/15 MRI completed   Interim History / Subjective:  Remains good we will intubated on mechanical life support. CT head completed early this morning with worsening cytotoxic edema.  Objective   Blood pressure (!) 158/89, pulse (!) 112, temperature 98.4 F (36.9 C), temperature source Oral, resp. rate 16, height '5\' 3"'$  (1.6 m), weight 103.3 kg, SpO2 94 %.    Vent Mode: PSV;CPAP FiO2 (%):  [40 %] 40 % PEEP:  [5 cmH20] 5 cmH20 Pressure Support:  [8 cmH20] 8 cmH20 Plateau  Pressure:  [18 cmH20-19 cmH20] 18 cmH20   Intake/Output Summary (Last 24 hours) at 10/07/2022 0820 Last data filed at 10/07/2022 0700 Gross per 24 hour  Intake 2618.38 ml  Output 1500 ml  Net 1118.38 ml   Filed Weights   09/24/2022 1900 10/06/22 0500 10/07/22 0500  Weight: 102.6 kg 90.4 kg 103.3 kg    General: Critically ill mechanical life support intubated HEENT: Tube in place Neuro: Sedated on propofol and fentanyl CV: Regular rate rhythm S1-S2 PULM: Bilateral mechanically ventilated breath sounds GI: Soft nontender nondistended Extremities: No significant edema Skin: No rash   Resolved Hospital Problem list     Assessment & Plan:   Post-op mechanical ventilation management 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  Complicated by Post-procedure Perisylvian SAH W/ right anterior frontal and temporal cortex, right basal ganglia ischemia Cytotoxic cerebral edema  S/p thrombectomy with TICI 2a recanalization and stent retriever and aspiration but with subsequent re-occlusion and concern for SAH, left intubated P: Post stroke management per stroke team Repeat CT head this morning, worsening cytotoxic edema in the posterior right MCA territory, 3 mm left inward shift Cleviprex for blood pressure control PAD guidelines sedation's with fentanyl plus propofol Continue full mechanical vent support VAP prophylaxis 3% saline per neurology, every 6 hours sodium checks.  Acute hypoxemic respiratory failure  P; Possible aspiration pneurmonia/pneumonitis Sputum culture, chest x-ray this a.m. Start Unasyn Does have positive cumulative fluid balance. Likely has some components of pulmonary edema, benefit from additional diuresis, sodium  has climbed above goal we will give 5 mg metolazone +60 mg Lasix x 1.  CKD stage 3a HTN Creatinine near baseline at 1.5 -Holding home BP meds - Cleviprex for BP control, systolic blood pressure 765-465  Type 2 DM -Holding metformin,  continue SSI with CBGs  Hypokalemia  -Replete as needed  Tube feeds -Continue tube feeds   Best Practice (right click and "Reselect all SmartList Selections" daily)   Diet/type: tubefeeds DVT prophylaxis: SCD GI prophylaxis: PPI Lines: N/A Foley:  Yes, and it is still needed Code Status:  full code Last date of multidisciplinary goals of care discussion [per primary stroke team.  Labs   CBC: Recent Labs  Lab 09/23/2022 1945 10/10/2022 1946 10/05/22 0510 10/06/22 0546 10/07/22 0200  WBC 8.8  --  5.8 11.6* 15.0*  NEUTROABS 3.8  --   --   --   --   HGB 16.5* 17.0* 17.4* 13.4 12.4  HCT 46.9* 50.0* 50.2* 40.7 37.5  MCV 87.2  --  87.9 90.8 93.5  PLT 178  --  198 163 119*    Basic Metabolic Panel: Recent Labs  Lab 10/03/2022 1945 10/18/2022 1946 10/05/22 0510 10/05/22 0916 10/05/22 1048 10/05/22 1647 10/05/22 2318 10/06/22 0546 10/06/22 1543 10/06/22 2200 10/07/22 0200  NA 138 138 139  --    < > 145 151* 154*  154* 148* 162* 158*  K 3.2* 3.2* 3.9  --   --   --   --  4.1  --   --  3.8  CL 106 106 104  --   --   --   --  128*  --   --  >130*  CO2 19*  --  21*  --   --   --   --  20*  --   --  19*  GLUCOSE 104* 104* 129*  --   --   --   --  127*  --   --  122*  BUN 24* 28* 27*  --   --   --   --  41*  --   --  44*  CREATININE 1.51* 1.60* 1.79*  --   --   --   --  1.97*  --   --  1.98*  CALCIUM 8.6*  --  8.7*  --   --   --   --  7.8*  --   --  8.2*  MG  --   --   --  2.1  --  2.2  --  2.7* 2.5*  --   --   PHOS  --   --   --  4.2  --  3.5  --  4.5 3.2  --   --    < > = values in this interval not displayed.   GFR: Estimated Creatinine Clearance: 37.3 mL/min (A) (by C-G formula based on SCr of 1.98 mg/dL (H)). Recent Labs  Lab 09/28/2022 1945 10/05/22 0510 10/06/22 0546 10/07/22 0200  WBC 8.8 5.8 11.6* 15.0*    Liver Function Tests: Recent Labs  Lab 10/03/2022 1945  AST 22  ALT 18  ALKPHOS 126  BILITOT 0.5  PROT 7.1  ALBUMIN 3.4*   No results for input(s):  "LIPASE", "AMYLASE" in the last 168 hours. No results for input(s): "AMMONIA" in the last 168 hours.  ABG    Component Value Date/Time   PHART 7.35 10/20/2022 2320   PCO2ART 39 10/12/2022 2320   PO2ART 76 (L) 09/26/2022 2320  HCO3 21.6 10/12/2022 2320   TCO2 21 (L) 09/29/2022 1946   ACIDBASEDEF 3.9 (H) 10/02/2022 2320   O2SAT 96.5 10/07/2022 2320     Coagulation Profile: Recent Labs  Lab 10/10/2022 1945  INR 1.1    Cardiac Enzymes: No results for input(s): "CKTOTAL", "CKMB", "CKMBINDEX", "TROPONINI" in the last 168 hours.  HbA1C: HbA1c, POC (prediabetic range)  Date/Time Value Ref Range Status  07/24/2018 10:57 AM 5.8 5.7 - 6.4 % Final   HbA1c, POC (controlled diabetic range)  Date/Time Value Ref Range Status  12/31/2018 03:31 PM 5.4 0.0 - 7.0 % Final  07/24/2018 10:57 AM 5.8 0.0 - 7.0 % Final   HbA1c POC (<> result, manual entry)  Date/Time Value Ref Range Status  07/24/2018 10:57 AM 5.8 4.0 - 5.6 % Final   Hgb A1c MFr Bld  Date/Time Value Ref Range Status  10/05/2022 05:10 AM 5.6 4.8 - 5.6 % Final    Comment:    (NOTE)         Prediabetes: 5.7 - 6.4         Diabetes: >6.4         Glycemic control for adults with diabetes: <7.0   05/16/2022 02:45 PM 5.5 4.8 - 5.6 % Final    Comment:             Prediabetes: 5.7 - 6.4          Diabetes: >6.4          Glycemic control for adults with diabetes: <7.0     CBG: Recent Labs  Lab 10/06/22 1604 10/06/22 1940 10/06/22 2325 10/07/22 0344 10/07/22 0732  GLUCAP 121* 106* 149* 144* 158*    Review of Systems:   Unable to obtain  Past Medical History:  She,  has a past medical history of Asthma, CHF (congestive heart failure) ([place]), Depression, Hypertension, Obesity, Sickle cell trait ([place]), and Stroke ([place]).   Surgical History:   Past Surgical History:  Procedure Laterality Date   IR CT HEAD LTD  09/23/2022   IR PERCUTANEOUS ART THROMBECTOMY/INFUSION INTRACRANIAL INC DIAG ANGIO  09/25/2022   IR US GUIDE  VASC ACCESS RIGHT  10/07/2022   NO PAST SURGERIES     RADIOLOGY WITH ANESTHESIA N/A 10/06/2022   Procedure: IR WITH ANESTHESIA;  Surgeon: Luanne Bras, MD;  Location: [place];  Service: Radiology;  Laterality: N/A;     Social History:   reports that she has been smoking cigarettes. She has been smoking an average of .25 packs per day. She has never used smokeless tobacco. She reports that she does not currently use alcohol. She reports that she does not use drugs.   Family History:  Her family history includes Cancer in her father; Diabetes in her son and another family member; Hypertension in her father, mother, and another family member; Sickle cell trait in an other family member. There is no history of Other.   Allergies Allergies  Allergen Reactions   Bee Venom Anaphylaxis   Shellfish Allergy Anaphylaxis and Swelling     Home Medications  Prior to Admission medications   Medication Sig Start Date End Date Taking? Authorizing Provider  acetaminophen (TYLENOL) 325 MG tablet Take 2 tablets (650 mg total) by mouth every 6 (six) hours as needed for mild pain or moderate pain. 06/10/15   Kinnie Feil, MD  albuterol (PROVENTIL) (2.5 MG/3ML) 0.083% nebulizer solution Use 3 mLs (2.5 mg total) by nebulization every 6 (six) hours as needed for wheezing or shortness of  breath. 05/16/22   Argentina Donovan, PA-C  albuterol (VENTOLIN HFA) 108 (90 Base) MCG/ACT inhaler INHALE 2 PUFFS INTO THE LUNGS EVERY 6 (SIX) HOURS AS NEEDED FOR WHEEZING OR SHORTNESS OF BREATH. 02/06/22   Gildardo Pounds, NP  amLODipine (NORVASC) 10 MG tablet Take 1 tablet (10 mg total) by mouth daily. 05/16/22   Argentina Donovan, PA-C  atorvastatin (LIPITOR) 20 MG tablet Take 1 tablet (20 mg total) by mouth daily. 05/16/22   Argentina Donovan, PA-C  fluticasone furoate-vilanterol (BREO ELLIPTA) 100-25 MCG/ACT AEPB Inhale 1 puff into the lungs daily. 05/25/22   Charlott Rakes, MD  metFORMIN (GLUCOPHAGE) 500 MG tablet Take 1  tablet (500 mg total) by mouth daily with breakfast. 05/16/22 06/24/22  Argentina Donovan, PA-C  ondansetron (ZOFRAN ODT) 4 MG disintegrating tablet Take 1 tablet (4 mg total) by mouth every 8 (eight) hours as needed for nausea or vomiting. 08/22/21   Mayers, Cari S, PA-C  pantoprazole (PROTONIX) 20 MG tablet Take 1 tablet (20 mg total) by mouth daily. 02/06/22   Gildardo Pounds, NP  sertraline (ZOLOFT) 50 MG tablet Take 1 tablet (50 mg total) by mouth daily. 05/16/22   Argentina Donovan, PA-C  traMADol (ULTRAM) 50 MG tablet Take 1 tablet (50 mg total) by mouth every 6 (six) hours as needed. 05/16/22   Argentina Donovan, PA-C  traMADol (ULTRAM) 50 MG tablet Take 1 tablet by mouth every 6 hours  as needed. 05/16/22   Argentina Donovan, PA-C  TRUEplus Lancets 28G MISC USE AS DIRECTED TWICE DAILY 03/10/20   Charlott Rakes, MD   This patient is critically ill with multiple organ system failure; which, requires frequent high complexity decision making, assessment, support, evaluation, and titration of therapies. This was completed through the application of advanced monitoring technologies and extensive interpretation of multiple databases. During this encounter critical care time was devoted to patient care services described in this note for 32 minutes.  Garner Nash, DO [facility] 10/07/2022 8:20 AM

## 2022-10-07 NOTE — Progress Notes (Addendum)
STROKE TEAM PROGRESS NOTE   INTERVAL HISTORY No family at the bedside.  Remains intubated and sedated. 3% on hold, Na 158    Serum sodium is 158.  Neurological exam limited by sedation WBC 15 CT Head 12/17- Worsened cytotoxic edema throughout the posterior right MCA territory with 3 mm of leftward midline shift. Continued diffusion of hyperdense subarachnoid material, which may be blood or contrast agent. She has low-grade fever is tachycardic and has elevated white count. Vitals:   10/07/22 0400 10/07/22 0500 10/07/22 0600 10/07/22 0700  BP: (!) 140/76 (!) 146/81 (!) 147/79 (!) 158/89  Pulse: 90 91 91 (!) 112  Resp: '11 11 11 16  '$ Temp:      TempSrc:      SpO2: 95% 94% 94% 94%  Weight:  103.3 kg    Height:       CBC:  Recent Labs  Lab 10/03/2022 1945 10/03/2022 1946 10/06/22 0546 10/07/22 0200  WBC 8.8   < > 11.6* 15.0*  NEUTROABS 3.8  --   --   --   HGB 16.5*   < > 13.4 12.4  HCT 46.9*   < > 40.7 37.5  MCV 87.2   < > 90.8 93.5  PLT 178   < > 163 119*   < > = values in this interval not displayed.    Basic Metabolic Panel:  Recent Labs  Lab 10/06/22 0546 10/06/22 1543 10/06/22 2200 10/07/22 0200  NA 154*  154* 148* 162* 158*  K 4.1  --   --  3.8  CL 128*  --   --  >130*  CO2 20*  --   --  19*  GLUCOSE 127*  --   --  122*  BUN 41*  --   --  44*  CREATININE 1.97*  --   --  1.98*  CALCIUM 7.8*  --   --  8.2*  MG 2.7* 2.5*  --   --   PHOS 4.5 3.2  --   --     Lipid Panel:  Recent Labs  Lab 10/05/22 0510 10/06/22 0546  CHOL 252*  --   TRIG 126 185*  HDL 53  --   CHOLHDL 4.8  --   VLDL 25  --   LDLCALC 174*  --     HgbA1c:  Recent Labs  Lab 10/05/22 0510  HGBA1C 5.6    Urine Drug Screen:  Recent Labs  Lab 10/05/22 1749  LABOPIA NONE DETECTED  COCAINSCRNUR POSITIVE*  LABBENZ NONE DETECTED  AMPHETMU NONE DETECTED  THCU NONE DETECTED  LABBARB NONE DETECTED     Alcohol Level  Recent Labs  Lab 09/21/2022 1939  ETH <10     IMAGING past 24  hours CT HEAD WO CONTRAST (5MM)  Result Date: 10/07/2022 CLINICAL DATA:  Stroke follow-up EXAM: CT HEAD WITHOUT CONTRAST TECHNIQUE: Contiguous axial images were obtained from the base of the skull through the vertex without intravenous contrast. RADIATION DOSE REDUCTION: This exam was performed according to the departmental dose-optimization program which includes automated exposure control, adjustment of the mA and/or kV according to patient size and/or use of iterative reconstruction technique. COMPARISON:  10/05/2022 FINDINGS: Brain: Worsened cytotoxic edema throughout the posterior right MCA territory. Continued diffusion of hyperdense subarachnoid material, which may be blood or contrast agent. Parenchymal staining of the left lentiform nucleus is unchanged. There is no new site of hemorrhage. There is 3 mm of leftward midline shift. No hydrocephalus. Vascular: No hyperdense vessel or unexpected  calcification. Skull: Normal. Negative for fracture or focal lesion. Sinuses/Orbits: Left maxillary sinus opacification. Other: None. IMPRESSION: 1. Worsened cytotoxic edema throughout the posterior right MCA territory with 3 mm of leftward midline shift. 2. Continued diffusion of hyperdense subarachnoid material, which may be blood or contrast agent. Electronically Signed   By: Ulyses Jarred M.D.   On: 10/07/2022 00:40    PHYSICAL EXAM  Physical Exam  Constitutional: Appears well-developed and well-nourished.  Middle-aged African-American lady who is intubated and sedated Cardiovascular: Normal rate and regular rhythm.  Respiratory: Mechanically ventilated  Neuro: Mental Status: Intubated, sedated, opens eyes to noxious stimuli.  Follows occasional commands on the right side. Cranial Nerves: II: Pupils are equal and sluggish III,IV, VI: Eyes are midline X: Cough and gag intact XI: Head is turned to the right Motor: Tone is normal. Bulk is normal. Spontaneous movement noted in right upper  extremity Movement with painful stimuli in right lower extremity and left upper extremity Flicker of movement noted in left lower extremity Sensory: Localizes to pain in bilateral upper extremities and right lower extremity, no reaction to painful stimuli in left lower extremity  Patient still intubated, on vent, eyes halfway open with voice, not follow commands.  With forced eye opening, eye midline, PERRL, no doll's eyes, positive with gag cough and corneal reflexes.  Has spontaneous movement of right arm and right leg with right arm able to against gravity.  No movement of left upper extremity on pain stimulation, slight withdrawal of left lower extremity on pain stimulation.  Sensation, coordination and gait not tested.  ASSESSMENT/PLAN Taylor Vazquez is a 56 y.o. female with history of CHF, hypertension, obesity, sickle cell trait, prior stroke, cocaine use, presenting for evaluation of sudden onset of left-sided weakness with weakness last known well at 6 PM.  She was given TNK and emergently taken for a mechanical thrombectomy of her M2 MCA posterior division branch.  They achieved temporary recanalization with subsequent reocclusion.  Flat panel CT showed intense contrast blushing and edema of the right frontal and anterior temporal cortex and basal ganglia, suggesting ongoing ischemia . A small right Sylvian hyperdensity is also seen, suggesting contrast extravasation with likely associated SAH.  She returned to ICU intubated.  MRI showed right MCA large infarct.  MRA showed right M2 persistent occlusion.   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 ASPECTS is 8.  CTA head & neck Right MCA M2 segment occlusion with poor collateralization. S/p IR with temporary recanalization with subsequent reocclusion (TICI 2A)  CT Head- Right perisylvian SAH/contrast has diffused but not  clearly increased. Continue contrast blush extensively within right anterior frontal and temporal cortex and in the right basal ganglia ischemia, ACA and MCA territory is involved. Additional cytotoxic edema in the lateral and high right frontal cortex. MRI Brain- Large area of early subacute ischemia throughout the posterior right MCA territory. Mass effect on the right lateral ventricle with trace leftward midline shift. MRA Brain-  No flow related enhancement seen in the right MCA M2 branches aside from the most anterior part of the superior division, unchanged from yesterday's CTA. 2D Echo EF 60-65%  LDL 174 HgbA1c 5.5 UDS cocaine positive VTE prophylaxis - heparin subq No antithrombotic prior to admission, now on ASA 325 Therapy recommendations: Pending Disposition: Pending  Cerebral edema with brain compression Hypertonic saline initiated at 64m/hr- Now off  Sodium  Q6 hr  Na 139 -> 140->145 -> 151 -> 154 -> 148 -> 162 -> 158 PICC line ordered  Hypertension Home meds: Amlodipine Stable IV Cleviprex infusing BP goal 130-150  Hyperlipidemia Home meds: Atorvastatin 20 mg LDL 174, goal < 70 Put on crestor 40 Continue statin at discharge  Acute Hypoxic respiratory failure  CCM on board Remains intubated post procedure Wean to extubate per CCM team  Cocaine abuse Hx of cocaine use UDS positive for cocaine this time Cessation education will be provided.   Tobacco abuse Current smoker Smoking cessation counseling will be provided  Alcohol abuse Per daughter, she is drinking frequently Not compliant with medication with drinking CIWA protocol   Other Stroke Risk Factors Obesity, Body mass index is 40.34 kg/m., BMI >/= 30 associated with increased stroke risk, recommend weight loss, diet and exercise as appropriate  Hx of stroke Congestive heart failure  Other Active Problems Depression Sertraline 50 mg Hypokalemia Repleted, K now 3.9 Leukocytosis  WBC 11.8 ->  15  Hospital day # 3  Patient seen and examined by NP/APP with MD. MD to update note as needed.   Janine Ores, DNP, FNP-BC [facility] Pager: [phone]  I have personally obtained history,examined this patient, reviewed notes, independently viewed imaging studies, participated in medical decision making and plan of care.ROS completed by me personally and pertinent positives fully documented  I have made any additions or clarifications directly to the above note. Agree with note above.  She remains critically ill with respiratory failure and significant neurological damage.  Patient neurological exam limited due to sedation but follow-up CT scan shows slight increase in cytotoxic edema.  Hypertonic saline is off the serum sodium is above goal at 158.  We will let it drift down naturally.  Ventilatory management and management of possible aspiration as per critical care team.  No family available at the bedside for discussion.  Discussed with Dr. Leory Plowman Icard critical care medicine. This patient is critically ill and at significant risk of neurological worsening, death and care requires constant monitoring of vital signs, hemodynamics,respiratory and cardiac monitoring, extensive review of multiple databases, frequent neurological assessment, discussion with family, other specialists and medical decision making of high complexity.I have made any additions or clarifications directly to the above note.This critical care time does not reflect procedure time, or teaching time or supervisory time of PA/NP/Med Resident etc but could involve care discussion time.  I spent 30 minutes of neurocritical care time  in the care of  this patient.     Antony Contras, MD Medical Director [facility] Pager: [phone] 10/07/2022 2:18 PM   To contact Stroke Continuity provider, please refer to [email or web address] After hours, contact General Neurology

## 2022-10-07 NOTE — Progress Notes (Signed)
PT Cancellation Note  Patient Details Name: Taylor Vazquez MRN: [id number] DOB: 08/02/1966   Cancelled Treatment:    Reason Eval/Treat Not Completed: Medical issues which prohibited therapy (Pt stil intubated and sedated.  Nurse stated to HOLD today.)   [name] 10/07/2022, 8:49 AM Shady Bradish M,PT Acute Rehab Services [phone]

## 2022-10-07 NOTE — Progress Notes (Signed)
Transported pt to and from CT with no complication

## 2022-10-08 LAB — CBC
HCT: 41.9 % (ref 36.0–46.0)
Hemoglobin: 13.5 g/dL (ref 12.0–15.0)
MCH: 30.1 pg (ref 26.0–34.0)
MCHC: 32.2 g/dL (ref 30.0–36.0)
MCV: 93.5 fL (ref 80.0–100.0)
Platelets: 171 10*3/uL (ref 150–400)
RBC: 4.48 MIL/uL (ref 3.87–5.11)
RDW: 15.3 % (ref 11.5–15.5)
WBC: 16.8 10*3/uL — ABNORMAL HIGH (ref 4.0–10.5)
nRBC: 0 % (ref 0.0–0.2)

## 2022-10-08 LAB — BASIC METABOLIC PANEL
Anion gap: 11 (ref 5–15)
BUN: 59 mg/dL — ABNORMAL HIGH (ref 6–20)
CO2: 17 mmol/L — ABNORMAL LOW (ref 22–32)
Calcium: 8.5 mg/dL — ABNORMAL LOW (ref 8.9–10.3)
Chloride: 129 mmol/L — ABNORMAL HIGH (ref 98–111)
Creatinine, Ser: 2.79 mg/dL — ABNORMAL HIGH (ref 0.44–1.00)
GFR, Estimated: 20 mL/min — ABNORMAL LOW (ref >60)
Glucose, Bld: 122 mg/dL — ABNORMAL HIGH (ref 70–99)
Potassium: 3.2 mmol/L — ABNORMAL LOW (ref 3.5–5.1)
Sodium: 157 mmol/L — ABNORMAL HIGH (ref 135–145)

## 2022-10-08 LAB — GLUCOSE, CAPILLARY
Glucose-Capillary: 144 mg/dL — ABNORMAL HIGH (ref 70–99)
Glucose-Capillary: 133 mg/dL — ABNORMAL HIGH (ref 70–99)
Glucose-Capillary: 81 mg/dL (ref 70–99)
Glucose-Capillary: 127 mg/dL — ABNORMAL HIGH (ref 70–99)
Glucose-Capillary: 145 mg/dL — ABNORMAL HIGH (ref 70–99)
Glucose-Capillary: 140 mg/dL — ABNORMAL HIGH (ref 70–99)

## 2022-10-08 LAB — MAGNESIUM: Magnesium: 2.7 mg/dL — ABNORMAL HIGH (ref 1.7–2.4)

## 2022-10-08 LAB — BRAIN NATRIURETIC PEPTIDE: B Natriuretic Peptide: 121.7 pg/mL — ABNORMAL HIGH (ref 0.0–100.0)

## 2022-10-08 MED ORDER — POTASSIUM CHLORIDE 20 MEQ PO PACK
40.0000 meq | PACK | Freq: Two times a day (BID) | ORAL | Status: AC
  Administered 2022-10-08 (×2): 40 meq
  Filled 2022-10-08 (×2): qty 2

## 2022-10-08 MED ORDER — POTASSIUM CHLORIDE 20 MEQ PO PACK
40.0000 meq | PACK | Freq: Two times a day (BID) | ORAL | Status: DC
  Filled 2022-10-08: qty 2

## 2022-10-08 MED ORDER — FENTANYL BOLUS VIA INFUSION
50.0000 ug | INTRAVENOUS | Status: DC | PRN
  Administered 2022-10-15: 50 ug via INTRAVENOUS
  Administered 2022-10-15 (×2): 100 ug via INTRAVENOUS
  Administered 2022-10-15: 50 ug via INTRAVENOUS
  Administered 2022-10-15 (×2): 100 ug via INTRAVENOUS

## 2022-10-08 MED ORDER — SENNOSIDES-DOCUSATE SODIUM 8.6-50 MG PO TABS: 1.0000 | ORAL_TABLET | Freq: Every evening | ORAL | Status: DC | PRN

## 2022-10-08 NOTE — Progress Notes (Signed)
SLP Cancellation Note  Patient Details Name: Taylor Vazquez MRN: [id number] DOB: 1966-01-14   Cancelled treatment:       Reason Eval/Treat Not Completed: Patient not medically ready (on vent). Will f/u as able.     Osie Bond., M.A. [place] Office [phone]  Secure chat preferred  10/08/2022, 9:00 AM

## 2022-10-08 NOTE — Progress Notes (Signed)
STROKE TEAM PROGRESS NOTE   INTERVAL HISTORY No family at the bedside.  Remains intubated and sedated. 3% on hold, Na 158    Serum sodium is 157.  Neurological exam limited by sedation WBC 16.8 CT Head 12/17- Worsened cytotoxic edema throughout the posterior right MCA territory with 3 mm of leftward midline shift. Continued diffusion of hyperdense subarachnoid material, which may be blood or contrast agent. Taylor Vazquez has low-grade fever is tachycardic and has elevated white count.  And was started on antibiotics yesterday.  Continues to require ventilatory support for respiratory failure Vitals:   10/08/22 1400 10/08/22 1523 10/08/22 1600 10/08/22 1700  BP: 128/74  (!) 159/83 139/80  Pulse: (!) 113 (!) 116 (!) 113 (!) 108  Resp: '19 18 18 17  '$ Temp:   99.2 F (37.3 C)   TempSrc:   Axillary   SpO2: 97% 97% 99% 97%  Weight:      Height:       CBC:  Recent Labs  Lab 10/20/2022 1945 09/23/2022 1946 10/07/22 0200 10/08/22 0605  WBC 8.8   < > 15.0* 16.8*  NEUTROABS 3.8  --   --   --   HGB 16.5*   < > 12.4 13.5  HCT 46.9*   < > 37.5 41.9  MCV 87.2   < > 93.5 93.5  PLT 178   < > 119* 171   < > = values in this interval not displayed.   Basic Metabolic Panel:  Recent Labs  Lab 10/06/22 0546 10/06/22 1543 10/06/22 2200 10/07/22 0200 10/08/22 0605  NA 154*  154* 148*   < > 158* 157*  K 4.1  --   --  3.8 3.2*  CL 128*  --   --  >130* 129*  CO2 20*  --   --  19* 17*  GLUCOSE 127*  --   --  122* 122*  BUN 41*  --   --  44* 59*  CREATININE 1.97*  --   --  1.98* 2.79*  CALCIUM 7.8*  --   --  8.2* 8.5*  MG 2.7* 2.5*  --   --  2.7*  PHOS 4.5 3.2  --   --   --    < > = values in this interval not displayed.   Lipid Panel:  Recent Labs  Lab 10/05/22 0510 10/06/22 0546  CHOL 252*  --   TRIG 126 185*  HDL 53  --   CHOLHDL 4.8  --   VLDL 25  --   LDLCALC 174*  --    HgbA1c:  Recent Labs  Lab 10/05/22 0510  HGBA1C 5.6   Urine Drug Screen:  Recent Labs  Lab 10/05/22 1749   LABOPIA NONE DETECTED  COCAINSCRNUR POSITIVE*  LABBENZ NONE DETECTED  AMPHETMU NONE DETECTED  THCU NONE DETECTED  LABBARB NONE DETECTED    Alcohol Level  Recent Labs  Lab 10/21/2022 1939  ETH <10    IMAGING past 24 hours No results found.  PHYSICAL EXAM  Physical Exam  Constitutional: Appears well-developed and well-nourished.  Taylor Vazquez who is intubated and sedated Cardiovascular: Normal rate and regular rhythm.  Respiratory: Mechanically ventilated  Neuro: Mental Status: Intubated, sedated, opens eyes to noxious stimuli.  Not following any commands cranial Nerves: II: Pupils are equal and sluggish III,IV, VI: Eyes are midline X: Cough and gag intact XI: Head is turned to the right Motor: Tone is normal. Bulk is normal. Spontaneous movement noted in right upper extremity Movement  with painful stimuli in right lower extremity and left upper extremity Flicker of movement noted in left lower extremity Sensory: Localizes to pain in bilateral upper extremities and right lower extremity, no reaction to painful stimuli in left lower extremity    ASSESSMENT/PLAN Taylor Vazquez is a 56 y.o. female with history of CHF, hypertension, obesity, sickle cell trait, prior stroke, cocaine use, presenting for evaluation of sudden onset of left-sided weakness with weakness last known well at 6 PM.  Taylor Vazquez was given TNK and emergently taken for a mechanical thrombectomy of her M2 MCA posterior division branch.  They achieved temporary recanalization with subsequent reocclusion.  Flat panel CT showed intense contrast blushing and edema of the right frontal and anterior temporal cortex and basal ganglia, suggesting ongoing ischemia . A small right Sylvian hyperdensity is also seen, suggesting contrast extravasation with likely associated SAH.  Taylor Vazquez returned to ICU intubated.  MRI showed right MCA large infarct.  MRA showed right M2 persistent occlusion.   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 ASPECTS is 8.  CTA head & neck Right MCA M2 segment occlusion with poor collateralization. S/p IR with temporary recanalization with subsequent reocclusion (TICI 2A)  CT Head- Right perisylvian SAH/contrast has diffused but not clearly increased. Continue contrast blush extensively within right anterior frontal and temporal cortex and in the right basal ganglia ischemia, ACA and MCA territory is involved. Additional cytotoxic edema in the lateral and high right frontal cortex. MRI Brain- Large area of early subacute ischemia throughout the posterior right MCA territory. Mass effect on the right lateral ventricle with trace leftward midline shift. MRA Brain-  No flow related enhancement seen in the right MCA M2 branches aside from the most anterior part of the superior division, unchanged from yesterday's CTA. 2D Echo EF 60-65%  LDL 174 HgbA1c 5.5 UDS cocaine positive VTE prophylaxis - heparin subq No antithrombotic prior to admission, now on ASA 325 Therapy recommendations: Pending Disposition: Pending  Cerebral edema with brain compression Hypertonic saline initiated at 71m/hr- Now off  Sodium Q6 hr  Na 139 -> 140->145 -> 151 -> 154 -> 148 -> 162 -> 158 PICC line ordered  Hypertension Home meds: Amlodipine Stable IV Cleviprex infusing BP goal 130-150  Hyperlipidemia Home meds: Atorvastatin 20 mg LDL 174, goal < 70 Put on crestor 40 Continue statin at discharge  Acute Hypoxic respiratory failure  CCM on board Remains intubated post procedure Wean to extubate per CCM team  Cocaine abuse Hx of cocaine use UDS positive for cocaine this time Cessation education will be provided.   Tobacco abuse Current smoker Smoking cessation counseling will be provided  Alcohol abuse Per daughter, Taylor Vazquez is drinking frequently Not compliant  with medication with drinking CIWA protocol   Other Stroke Risk Factors Obesity, Body mass index is 40.34 kg/m., BMI >/= 30 associated with increased stroke risk, recommend weight loss, diet and exercise as appropriate  Hx of stroke Congestive heart failure  Other Active Problems Depression Sertraline 50 mg Hypokalemia Repleted, K now 3.9 Leukocytosis  WBC 11.8 -> 15  Hospital day # 4  Taylor Vazquez remains critically ill with respiratory failure and significant neurological deficits.  Patient `sneurological exam limited due to sedation but follow-up CT scan shows slight increase in cytotoxic edema.  Hypertonic saline is off the serum sodium is above goal at 157.  We will let it drift  down naturally.  Antibiotics per critical care team for presumed ventilatory management and management of possible aspiration as per critical care team.  No family available at the bedside for discussion.  Discussed with Dr. Leory Plowman Icard critical care medicine. This patient is critically ill and at significant risk of neurological worsening, death and care requires constant monitoring of vital signs, hemodynamics,respiratory and cardiac monitoring, extensive review of multiple databases, frequent neurological assessment, discussion with family, other specialists and medical decision making of high complexity.I have made any additions or clarifications directly to the above note.This critical care time does not reflect procedure time, or teaching time or supervisory time of PA/NP/Med Resident etc but could involve care discussion time.  I spent 30 minutes of neurocritical care time  in the care of  this patient.     Antony Contras, MD Medical Director [facility] Pager: [phone] 10/08/2022 5:05 PM   To contact Stroke Continuity provider, please refer to [email or web address] After hours, contact General Neurology

## 2022-10-08 NOTE — Progress Notes (Signed)
PT Cancellation Note  Patient Details Name: Taylor Vazquez MRN: [id number] DOB: 06/01/1966   Cancelled Treatment:    Reason Eval/Treat Not Completed: Medical issues which prohibited therapy - intubated, sedated, hold per RN.   Stacie Glaze, PT DPT Acute Rehabilitation Services Pager [phone]  Office [phone]]    Louis Matte 10/08/2022, 9:32 AM

## 2022-10-08 NOTE — Progress Notes (Signed)
OT Cancellation Note  Patient Details Name: Taylor Vazquez MRN: [id number] DOB: [birth year]   Cancelled Treatment:    Reason Eval/Treat Not Completed: Patient not medically ready. Pt remains intubated and sedated--in chat text with RN hold pt for today.  [place], OTR/L Acute Rehab Services [facility] [phone] Office [phone]    [name] 10/08/2022, 9:04 AM

## 2022-10-08 NOTE — Progress Notes (Addendum)
NAME:  Taylor Vazquez, MRN:  [id number], DOB:  [birth year], LOS: 4 ADMISSION DATE:  09/26/2022, CONSULTATION DATE:  10/08/22 REFERRING MD: Rory Percy  , CHIEF COMPLAINT:  Code stroke   History of Present Illness:  Taylor Vazquez is a 56 y.o. F with PMH significant for asthma, depression, ETOH use, HTN, Sickle cell trait , HFpEF who presented to the ED after being found by her daughter with flaccid L side and R gaze deviation.  Her LKW was 1830 12/14 found at 1925.  She had a normal glucose and was brought to the ED as a code stroke.  She was initially hypertensive 200/100's.  The decision was made to administer TNK.  Imaging showed R MCA M2 occlusion and so interventional radiology consulted.  Pt was combative in the ED requiring ativan.  Labs were significant for K 3.2, Creatinine 1.6.    Mechanical thrombectomy was performed with TICI 2A with temporary recanalization, but then subsequent re-occlusion.  Repeat imaging with contrast extravasation and likely SAH.  She was left intubated and PCCM consulted.   Pertinent  Medical History   has a past medical history of Asthma, CHF (congestive heart failure) ([place]), Depression, Hypertension, Obesity, Sickle cell trait ([place]), and Stroke ([place]).   Significant Hospital Events: Including procedures, antibiotic start and stop dates in addition to other pertinent events   12/14 presented as a code stroke, R MCA M2 occlusion, taken to IR, possible SAH post-procedure and left intubated 12/15 MRI completed  12/18 CPAP PS 5/5 and 40%,  Interim History / Subjective:  Remains intubated, sedated, tachy per tele with T max of 99.8 Propofol 30 mcg/ Fentanyl 50 mcg CT head completed 12/17 with worsening cytotoxic edema. CXR 12/17>> Congestive HF>> Lasix 60 mg x 1 dose and 5 mg metolazone Net + 3329, 1600 cc's UO last 24 hours Weaning on 40%, 5/5  Objective   Blood pressure (!) 160/79, pulse (!) 114, temperature 98.6 F (37 C), temperature source Axillary,  resp. rate 16, height '5\' 3"'$  (1.6 m), weight 103.3 kg, SpO2 98 %.    Vent Mode: PSV;CPAP FiO2 (%):  [40 %] 40 % PEEP:  [5 cmH20] 5 cmH20 Pressure Support:  [8 cmH20] 8 cmH20 Plateau Pressure:  [16 cmH20-20 cmH20] 20 cmH20   Intake/Output Summary (Last 24 hours) at 10/08/2022 0827 Last data filed at 10/08/2022 0800 Gross per 24 hour  Intake 2174.62 ml  Output 1900 ml  Net 274.62 ml   Filed Weights   09/30/2022 1900 10/06/22 0500 10/07/22 0500  Weight: 102.6 kg 90.4 kg 103.3 kg    General: Critically ill mechanical life support intubated and sedated HEENT: Tube in place, Cor Track , No LAD, trace JVD Neuro: Sedated on propofol and fentanyl, does not follow commands with WUA CV: Regular rate rhythm S1-S2, tachy PULM: Bilateral chest excursion, Coarse throughout with rhonchi, tan secretions  GI: Soft nontender , Slightly distended, BS diminished , Body mass index is 40.34 kg/m.  Extremities: No obvious deformities, trace generalized edema , sluggish refill Skin: No rash, No lesions , warm dry and intact  Na 157/ K 3.2/CO2 17/ Creatinine bump to 2.79/ BUN 59 WBC 16.8, up from 15 HGB 13.5 , platelets 171 Mag is pending   Resolved Hospital Problem list     Assessment & Plan:   Post-op mechanical ventilation management 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  Complicated by Post-procedure Perisylvian SAH W/ right anterior frontal and temporal cortex, right basal ganglia ischemia Cytotoxic cerebral edema  S/p  thrombectomy with TICI 2a recanalization and stent retriever and aspiration but with subsequent re-occlusion and concern for SAH, left intubated P: Post stroke management per stroke team Repeat CT head 12/17 , worsening cytotoxic edema in the posterior right MCA territory, 3 mm left inward shift Cleviprex for blood pressure control>> currently off   Acute hypoxemic respiratory failure  P; Possible aspiration pneurmonia/pneumonitis Sputum culture GS with abundant  WBC, abundant Gram negative rods, moderate gram + cocci in pairs  Continue  Unasyn Trend WBC and fever curve CXR in am and prn Continued  positive cumulative fluid balance. PAD guidelines sedation's with fentanyl plus propofol PS trials as tolerated  VAP prophylaxis Likely has some components of pulmonary edema, benefit from additional diuresis, sodium has climbed above goal we will give 5 mg metolazone +60 mg Lasix x 1.  CKD stage 3a HTN - Creatinine  bump to 2.79 - Holding home BP meds - Cleviprex for BP control, systolic blood pressure 449-675  Type 2 DM -Holding metformin, continue SSI with CBGs  Hypokalemia / Hypernatremia -Replete/ treat  as needed - Trend BMET - Maintain Mag > 2  Tube feeds -Continue tube feeds - CBG and SSI  Heart Failure Plan Trend BNP Goal of Euremia CXR prn  Pt. Has had a devastating stroke, will continue to monitor for neurological improvement, may need to involve palliation .   Best Practice (right click and "Reselect all SmartList Selections" daily)   Diet/type: tubefeeds DVT prophylaxis: SCD GI prophylaxis: PPI Lines: N/A Foley:  Yes, and it is still needed Code Status:  full code Last date of multidisciplinary goals of care discussion [per primary stroke team.  Labs   CBC: Recent Labs  Lab 10/17/2022 1945 10/01/2022 1946 10/05/22 0510 10/06/22 0546 10/07/22 0200 10/08/22 0605  WBC 8.8  --  5.8 11.6* 15.0* 16.8*  NEUTROABS 3.8  --   --   --   --   --   HGB 16.5* 17.0* 17.4* 13.4 12.4 13.5  HCT 46.9* 50.0* 50.2* 40.7 37.5 41.9  MCV 87.2  --  87.9 90.8 93.5 93.5  PLT 178  --  198 163 119* 916    Basic Metabolic Panel: Recent Labs  Lab 10/07/2022 1945 10/13/2022 1946 10/05/22 0510 10/05/22 0916 10/05/22 1048 10/05/22 1647 10/05/22 2318 10/06/22 0546 10/06/22 1543 10/06/22 2200 10/07/22 0200 10/08/22 0605  NA 138 138 139  --    < > 145   < > 154*  154* 148* 162* 158* 157*  K 3.2* 3.2* 3.9  --   --   --   --  4.1  --    --  3.8 3.2*  CL 106 106 104  --   --   --   --  128*  --   --  >130* 129*  CO2 19*  --  21*  --   --   --   --  20*  --   --  19* 17*  GLUCOSE 104* 104* 129*  --   --   --   --  127*  --   --  122* 122*  BUN 24* 28* 27*  --   --   --   --  41*  --   --  44* 59*  CREATININE 1.51* 1.60* 1.79*  --   --   --   --  1.97*  --   --  1.98* 2.79*  CALCIUM 8.6*  --  8.7*  --   --   --   --  7.8*  --   --  8.2* 8.5*  MG  --   --   --  2.1  --  2.2  --  2.7* 2.5*  --   --   --   PHOS  --   --   --  4.2  --  3.5  --  4.5 3.2  --   --   --    < > = values in this interval not displayed.   GFR: Estimated Creatinine Clearance: 26.5 mL/min (A) (by C-G formula based on SCr of 2.79 mg/dL (H)). Recent Labs  Lab 10/05/22 0510 10/06/22 0546 10/07/22 0200 10/08/22 0605  WBC 5.8 11.6* 15.0* 16.8*    Liver Function Tests: Recent Labs  Lab 09/29/2022 1945  AST 22  ALT 18  ALKPHOS 126  BILITOT 0.5  PROT 7.1  ALBUMIN 3.4*   No results for input(s): "LIPASE", "AMYLASE" in the last 168 hours. No results for input(s): "AMMONIA" in the last 168 hours.  ABG    Component Value Date/Time   PHART 7.35 10/12/2022 2320   PCO2ART 39 10/07/2022 2320   PO2ART 76 (L) 09/21/2022 2320   HCO3 21.6 10/11/2022 2320   TCO2 21 (L) 10/03/2022 1946   ACIDBASEDEF 3.9 (H) 09/30/2022 2320   O2SAT 96.5 10/02/2022 2320     Coagulation Profile: Recent Labs  Lab 10/06/2022 1945  INR 1.1    Cardiac Enzymes: No results for input(s): "CKTOTAL", "CKMB", "CKMBINDEX", "TROPONINI" in the last 168 hours.  HbA1C: HbA1c, POC (prediabetic range)  Date/Time Value Ref Range Status  07/24/2018 10:57 AM 5.8 5.7 - 6.4 % Final   HbA1c, POC (controlled diabetic range)  Date/Time Value Ref Range Status  12/31/2018 03:31 PM 5.4 0.0 - 7.0 % Final  07/24/2018 10:57 AM 5.8 0.0 - 7.0 % Final   HbA1c POC (<> result, manual entry)  Date/Time Value Ref Range Status  07/24/2018 10:57 AM 5.8 4.0 - 5.6 % Final   Hgb A1c MFr Bld   Date/Time Value Ref Range Status  10/05/2022 05:10 AM 5.6 4.8 - 5.6 % Final    Comment:    (NOTE)         Prediabetes: 5.7 - 6.4         Diabetes: >6.4         Glycemic control for adults with diabetes: <7.0   05/16/2022 02:45 PM 5.5 4.8 - 5.6 % Final    Comment:             Prediabetes: 5.7 - 6.4          Diabetes: >6.4          Glycemic control for adults with diabetes: <7.0     CBG: Recent Labs  Lab 10/07/22 1523 10/07/22 1917 10/07/22 2315 10/08/22 0333 10/08/22 0748  GLUCAP 151* 137* 155* 144* 133*   Allergies Allergies  Allergen Reactions   Bee Venom Anaphylaxis   Shellfish Allergy Anaphylaxis and Swelling     Home Medications  Prior to Admission medications   Medication Sig Start Date End Date Taking? Authorizing Provider  acetaminophen (TYLENOL) 325 MG tablet Take 2 tablets (650 mg total) by mouth every 6 (six) hours as needed for mild pain or moderate pain. 06/10/15   Kinnie Feil, MD  albuterol (PROVENTIL) (2.5 MG/3ML) 0.083% nebulizer solution Use 3 mLs (2.5 mg total) by nebulization every 6 (six) hours as needed for wheezing or shortness of breath. 05/16/22   Argentina Donovan, PA-C  albuterol (VENTOLIN HFA)  108 (90 Base) MCG/ACT inhaler INHALE 2 PUFFS INTO THE LUNGS EVERY 6 (SIX) HOURS AS NEEDED FOR WHEEZING OR SHORTNESS OF BREATH. 02/06/22   Gildardo Pounds, NP  amLODipine (NORVASC) 10 MG tablet Take 1 tablet (10 mg total) by mouth daily. 05/16/22   Argentina Donovan, PA-C  atorvastatin (LIPITOR) 20 MG tablet Take 1 tablet (20 mg total) by mouth daily. 05/16/22   Argentina Donovan, PA-C  fluticasone furoate-vilanterol (BREO ELLIPTA) 100-25 MCG/ACT AEPB Inhale 1 puff into the lungs daily. 05/25/22   Charlott Rakes, MD  metFORMIN (GLUCOPHAGE) 500 MG tablet Take 1 tablet (500 mg total) by mouth daily with breakfast. 05/16/22 06/24/22  Argentina Donovan, PA-C  ondansetron (ZOFRAN ODT) 4 MG disintegrating tablet Take 1 tablet (4 mg total) by mouth every 8 (eight)  hours as needed for nausea or vomiting. 08/22/21   Mayers, Cari S, PA-C  pantoprazole (PROTONIX) 20 MG tablet Take 1 tablet (20 mg total) by mouth daily. 02/06/22   Gildardo Pounds, NP  sertraline (ZOLOFT) 50 MG tablet Take 1 tablet (50 mg total) by mouth daily. 05/16/22   Argentina Donovan, PA-C  traMADol (ULTRAM) 50 MG tablet Take 1 tablet (50 mg total) by mouth every 6 (six) hours as needed. 05/16/22   Argentina Donovan, PA-C  traMADol (ULTRAM) 50 MG tablet Take 1 tablet by mouth every 6 hours  as needed. 05/16/22   Argentina Donovan, PA-C  TRUEplus Lancets 28G MISC USE AS DIRECTED TWICE DAILY 03/10/20   Charlott Rakes, MD   This patient is critically ill with multiple organ system failure; which, requires frequent high complexity decision making, assessment, support, evaluation, and titration of therapies. This was completed through the application of advanced monitoring technologies and extensive interpretation of multiple databases. During this encounter critical care time was devoted to patient care services described in this note for 35 minutes.  Magdalen Spatz, MSN, AGACNP-BC [place] See Amion for personal pager PCCM on call pager [phone]  [facility] 10/08/2022 8:27 AM

## 2022-10-09 ENCOUNTER — Inpatient Hospital Stay (HOSPITAL_COMMUNITY): Payer: Medicaid Other

## 2022-10-09 LAB — CBC WITH DIFFERENTIAL/PLATELET
Abs Immature Granulocytes: 0.14 10*3/uL — ABNORMAL HIGH (ref 0.00–0.07)
Basophils Absolute: 0.1 10*3/uL (ref 0.0–0.1)
Basophils Relative: 0 %
Eosinophils Absolute: 0.4 10*3/uL (ref 0.0–0.5)
Eosinophils Relative: 3 %
HCT: 39.9 % (ref 36.0–46.0)
Hemoglobin: 13.1 g/dL (ref 12.0–15.0)
Immature Granulocytes: 1 %
Lymphocytes Relative: 11 %
Lymphs Abs: 1.7 10*3/uL (ref 0.7–4.0)
MCH: 30.3 pg (ref 26.0–34.0)
MCHC: 32.8 g/dL (ref 30.0–36.0)
MCV: 92.4 fL (ref 80.0–100.0)
Monocytes Absolute: 1.5 10*3/uL — ABNORMAL HIGH (ref 0.1–1.0)
Monocytes Relative: 11 %
Neutro Abs: 10.8 10*3/uL — ABNORMAL HIGH (ref 1.7–7.7)
Neutrophils Relative %: 74 %
Platelets: 176 10*3/uL (ref 150–400)
RBC: 4.32 MIL/uL (ref 3.87–5.11)
RDW: 15.5 % (ref 11.5–15.5)
WBC: 14.6 10*3/uL — ABNORMAL HIGH (ref 4.0–10.5)
nRBC: 0.1 % (ref 0.0–0.2)

## 2022-10-09 LAB — BASIC METABOLIC PANEL
BUN: 74 mg/dL — ABNORMAL HIGH (ref 6–20)
CO2: 18 mmol/L — ABNORMAL LOW (ref 22–32)
Calcium: 8.8 mg/dL — ABNORMAL LOW (ref 8.9–10.3)
Chloride: >130 mmol/L (ref 98–111)
Creatinine, Ser: 3.21 mg/dL — ABNORMAL HIGH (ref 0.44–1.00)
GFR, Estimated: 17 mL/min — ABNORMAL LOW (ref >60)
Glucose, Bld: 160 mg/dL — ABNORMAL HIGH (ref 70–99)
Potassium: 3.6 mmol/L (ref 3.5–5.1)
Sodium: 160 mmol/L — ABNORMAL HIGH (ref 135–145)

## 2022-10-09 LAB — CULTURE, RESPIRATORY W GRAM STAIN: Culture: NORMAL

## 2022-10-09 LAB — GLUCOSE, CAPILLARY
Glucose-Capillary: 145 mg/dL — ABNORMAL HIGH (ref 70–99)
Glucose-Capillary: 153 mg/dL — ABNORMAL HIGH (ref 70–99)
Glucose-Capillary: 163 mg/dL — ABNORMAL HIGH (ref 70–99)
Glucose-Capillary: 134 mg/dL — ABNORMAL HIGH (ref 70–99)
Glucose-Capillary: 140 mg/dL — ABNORMAL HIGH (ref 70–99)
Glucose-Capillary: 143 mg/dL — ABNORMAL HIGH (ref 70–99)

## 2022-10-09 LAB — TRIGLYCERIDES: Triglycerides: 311 mg/dL — ABNORMAL HIGH (ref <150)

## 2022-10-09 LAB — MAGNESIUM: Magnesium: 2.9 mg/dL — ABNORMAL HIGH (ref 1.7–2.4)

## 2022-10-09 LAB — SODIUM: Sodium: 164 mmol/L (ref 135–145)

## 2022-10-09 MED ORDER — LACTATED RINGERS IV BOLUS
1000.0000 mL | Freq: Once | INTRAVENOUS | Status: AC
  Administered 2022-10-09: 1000 mL via INTRAVENOUS

## 2022-10-09 MED ORDER — ALBUTEROL SULFATE (2.5 MG/3ML) 0.083% IN NEBU
INHALATION_SOLUTION | RESPIRATORY_TRACT | Status: AC
  Administered 2022-10-09: 2.5 mg
  Filled 2022-10-09: qty 3

## 2022-10-09 MED ORDER — FREE WATER
200.0000 mL | Status: DC
  Administered 2022-10-09 – 2022-10-12 (×33): 200 mL

## 2022-10-09 MED ORDER — BANATROL TF EN LIQD
60.0000 mL | Freq: Two times a day (BID) | ENTERAL | Status: DC
  Administered 2022-10-09 – 2022-10-15 (×14): 60 mL
  Filled 2022-10-09 (×14): qty 60

## 2022-10-09 MED ORDER — VITAL AF 1.2 CAL PO LIQD
1000.0000 mL | ORAL | Status: DC
  Administered 2022-10-09 – 2022-10-15 (×6): 1000 mL

## 2022-10-09 MED ORDER — PROSOURCE TF20 ENFIT COMPATIBL EN LIQD
60.0000 mL | Freq: Every day | ENTERAL | Status: DC
  Administered 2022-10-10 – 2022-10-14 (×5): 60 mL
  Filled 2022-10-09 (×6): qty 60

## 2022-10-09 NOTE — Progress Notes (Signed)
On assessment, RT felt WOB was somewhat increased w/noted insp &exp wheezing. Albuterol tx given via aerogen w/some noted improvement.

## 2022-10-09 NOTE — Progress Notes (Addendum)
[place] Progress Note Patient Name: KANOE WANNER DOB: [birth year] MRN: [id number]   Date of Service  10/09/2022  HPI/Events of Note  Multiple issues: 1. Na+ = 164. 2. Hypertriglyceridemia - Triglycerides = 311. Patient is on a Propofol IV infusion. Has made 400 mL of urine since 6 PM.  eICU Interventions  Plan: Free water 200 mL per tube now and Q 2 hours. Serum and urine Osmolality now.  Continue to trend Na+. D/C Propofol IV infusion.  Titrate current Fentanyl IV infusion to maintain desired level of sedation.      Intervention Category Major Interventions: Electrolyte abnormality - evaluation and management  Reynard Christoffersen [place] 10/09/2022, 8:06 PM

## 2022-10-09 NOTE — TOC CAGE-AID Note (Signed)
Transition of Care [facility]) - CAGE-AID Screening   Patient Details  Name: Taylor Vazquez MRN: [id number] Date of Birth: January 07, 1966  Transition of Care [facility] CM/SW Contact:    Benard Halsted, LCSW Phone Number: 10/09/2022, 6:00 PM   Clinical Narrative: Patient currently intubated; screen not appropriate at this time.    CAGE-AID Screening: Substance Abuse Screening unable to be completed due to: : Patient unable to participate

## 2022-10-09 NOTE — Progress Notes (Signed)
PT Cancellation Note  Patient Details Name: Taylor Vazquez MRN: [id number] DOB: October 17, 1966   Cancelled Treatment:    Reason Eval/Treat Not Completed: Patient not medically ready - will sign off per rehab protocol.   Stacie Glaze, PT DPT Acute Rehabilitation Services Pager [phone]  Office [phone]    [place] 10/09/2022, 11:08 AM

## 2022-10-09 NOTE — Progress Notes (Signed)
Nutrition Follow-up  DOCUMENTATION CODES:   Not applicable  INTERVENTION:    Tube feeding via Cortrak tube: Increase Vital AF 1.2 to 55 ml/h (1320 ml per day) Decrease Prosource TF20 to 60 ml daily   Provides 1664 kcal, 119 gm protein, 1070 ml free water daily  Add Banatrol BID   NUTRITION DIAGNOSIS:   Inadequate oral intake related to inability to eat as evidenced by NPO status. Ongoing.   GOAL:   Patient will meet greater than or equal to 90% of their needs Met with TF at goal   MONITOR:   TF tolerance  REASON FOR ASSESSMENT:   Consult Enteral/tube feeding initiation and management  ASSESSMENT:   Pt with PMH of asthma, depression, ETOH use, HTN, sickle cell trait, CHF, DM, and CKD stage 3 now admitted with R MCA occlusion s/p mechanical thrombectomy with TICI 2 recanalization but then had subsequent re-occlusion and post procedure SAH.  Pt discussed during ICU rounds and with RN.  Family at bedside. Per MD mental status precludes extubation without clear plan for reintubation/trach vs DNI - Palliative care consulted  Noted increasing BUN/Cr   Medications reviewed and include: colace (held), SSI, protonix, miralax (held) Fentanyl   Labs reviewed: Na 160, Cl >130 BUN/Cr increasing: 74/3.21 CBG's: 127-163  NUTRITION - FOCUSED PHYSICAL EXAM:  Flowsheet Row Most Recent Value  Orbital Region No depletion  Upper Arm Region No depletion  Thoracic and Lumbar Region No depletion  Buccal Region No depletion  Temple Region No depletion  Clavicle Bone Region No depletion  Clavicle and Acromion Bone Region No depletion  Scapular Bone Region No depletion  Dorsal Hand No depletion  Patellar Region No depletion  Anterior Thigh Region No depletion  Posterior Calf Region No depletion  Edema (RD Assessment) None  Hair Reviewed  Eyes Unable to assess  Mouth Unable to assess  Skin Reviewed  Nails Reviewed       Diet Order:   Diet Order             Diet NPO  time specified  Diet effective now                   EDUCATION NEEDS:   Not appropriate for education at this time  Skin:  Skin Assessment: Reviewed RN Assessment  Last BM:  unknown  Height:   Ht Readings from Last 1 Encounters:  10/05/22 _0  (1.6 m)    Weight:   Wt Readings from Last 1 Encounters:  10/07/22 103.3 kg    BMI:  Body mass index is 40.34 kg/m.  Estimated Nutritional Needs:   Kcal:  1600  Protein:  105-130 grams  Fluid:  >1.6 L/day  Lockie Pares., RD, LDN, CNSC See AMiON for contact information

## 2022-10-09 NOTE — Progress Notes (Signed)
[place] Progress Note Patient Name: Taylor Vazquez DOB: 05/02/1966 MRN: [id number]   Date of Service  10/09/2022  HPI/Events of Note  Frequent loose stools - Nursing request for Flexiseal.   eICU Interventions  Plan: Place Flexiseal.      Intervention Category Major Interventions: Other:  Courtany Mcmurphy Cornelia Copa 10/09/2022, 1:03 AM

## 2022-10-09 NOTE — Progress Notes (Addendum)
STROKE TEAM PROGRESS NOTE   INTERVAL HISTORY Her 2 brothers are at the bedside.  Remains intubated and sedated. 3% on hold, Na 158    Serum sodium is 160.  Renal failure is worsening with creatinine up to 3.2 neurological exam limited by sedation WBC 14.6 She is afebrile today but is tachycardic and has elevated white count.  .  Continues to require ventilatory support for respiratory failure Vitals:   10/09/22 1139 10/09/22 1200 10/09/22 1300 10/09/22 1506  BP: 126/76 118/70 115/64 129/70  Pulse: 91 91 94 99  Resp: (!) 26 (!) '22 20 20  '$ Temp:  98.7 F (37.1 C)    TempSrc:  Axillary    SpO2: 93% 93% 92% 96%  Weight:      Height:       CBC:  Recent Labs  Lab 10/02/2022 1945 10/08/2022 1946 10/08/22 0605 10/09/22 0619  WBC 8.8   < > 16.8* 14.6*  NEUTROABS 3.8  --   --  10.8*  HGB 16.5*   < > 13.5 13.1  HCT 46.9*   < > 41.9 39.9  MCV 87.2   < > 93.5 92.4  PLT 178   < > 171 176   < > = values in this interval not displayed.   Basic Metabolic Panel:  Recent Labs  Lab 10/06/22 0546 10/06/22 1543 10/06/22 2200 10/08/22 0605 10/09/22 0619  NA 154*  154* 148*   < > 157* 160*  K 4.1  --    < > 3.2* 3.6  CL 128*  --    < > 129* >130*  CO2 20*  --    < > 17* 18*  GLUCOSE 127*  --    < > 122* 160*  BUN 41*  --    < > 59* 74*  CREATININE 1.97*  --    < > 2.79* 3.21*  CALCIUM 7.8*  --    < > 8.5* 8.8*  MG 2.7* 2.5*  --  2.7* 2.9*  PHOS 4.5 3.2  --   --   --    < > = values in this interval not displayed.   Lipid Panel:  Recent Labs  Lab 10/05/22 0510 10/06/22 0546 10/09/22 0619  CHOL 252*  --   --   TRIG 126   < > 311*  HDL 53  --   --   CHOLHDL 4.8  --   --   VLDL 25  --   --   LDLCALC 174*  --   --    < > = values in this interval not displayed.   HgbA1c:  Recent Labs  Lab 10/05/22 0510  HGBA1C 5.6   Urine Drug Screen:  Recent Labs  Lab 10/05/22 1749  LABOPIA NONE DETECTED  COCAINSCRNUR POSITIVE*  LABBENZ NONE DETECTED  AMPHETMU NONE DETECTED  THCU  NONE DETECTED  LABBARB NONE DETECTED    Alcohol Level  Recent Labs  Lab 10/15/2022 1939  ETH <10    IMAGING past 24 hours US RENAL  Result Date: 10/09/2022 CLINICAL DATA:  Acute kidney injured EXAM: RENAL / URINARY TRACT ULTRASOUND COMPLETE COMPARISON:  CT 08/15/2021 FINDINGS: Right Kidney: Renal measurements: 11.5 x 5.7 x 5.7 cm = volume: 194.3 mL. Moderate hydronephrosis with dilated proximal ureter. The mid to distal ureter is not visualized. Left Kidney: Renal measurements: 8.2 x 5.4 x 4.9 cm = volume: 113.3 mL. Mild left-sided pelviectasis. Bladder: Distended urinary bladder.  Ureteral jets not visualized. Other: None. IMPRESSION: Moderate right-sided  hydronephrosis with dilated proximal ureter. Mild left-sided pelviectasis. Distended urinary bladder. Electronically Signed   By: Maurine Simmering M.D.   On: 10/09/2022 10:03   DG CHEST PORT 1 VIEW  Result Date: 10/09/2022 CLINICAL DATA:  56 year old female with right MCA infarct. EXAM: PORTABLE CHEST 1 VIEW COMPARISON:  Portable chest 10/07/2022 and earlier. FINDINGS: Portable AP semi upright view at 0524 hours. Extubated. Enteric feeding tube remains in place. Lower lung volumes. Stable cardiomegaly and mediastinal contours. Bilateral reticulonodular pulmonary opacity, not significantly changed although now relatively sparing the left lung apex. No pneumothorax. No pleural effusion is evident. Paucity bowel gas in the visible abdomen. No acute osseous abnormality identified. IMPRESSION: 1. Extubated. Enteric feeding tube remains in place. 2. Lower lung volumes with continued bilateral reticulonodular opacity. Favor pulmonary edema over infection or aspiration. No pleural effusion is evident. Electronically Signed   By: Genevie Ann M.D.   On: 10/09/2022 08:47    PHYSICAL EXAM  Physical Exam  Constitutional: Appears well-developed and well-nourished.  Middle-aged African-American lady who is intubated and sedated Cardiovascular: Normal rate and  regular rhythm.  Respiratory: Mechanically ventilated  Neuro: Mental Status: Intubated, sedated, opens eyes to noxious stimuli.  Not following any commands  Cranial Nerves: II: Pupils are equal and sluggish III,IV, VI: Eyes are midline X: Cough and gag intact XI: Head is turned to the right Motor: Tone is normal. Bulk is normal. Spontaneous movement noted in right upper extremity Movement with painful stimuli in right lower extremity and left upper extremity Flicker of movement noted in left lower extremity Sensory: Localizes to pain in bilateral upper extremities and right lower extremity, no reaction to painful stimuli in left lower extremity    ASSESSMENT/PLAN Taylor Vazquez is a 56 y.o. female with history of CHF, hypertension, obesity, sickle cell trait, prior stroke, cocaine use, presenting for evaluation of sudden onset of left-sided weakness with weakness last known well at 6 PM.  She was given TNK and emergently taken for a mechanical thrombectomy of her M2 MCA posterior division branch.  They achieved temporary recanalization with subsequent reocclusion.  Flat panel CT showed intense contrast blushing and edema of the right frontal and anterior temporal cortex and basal ganglia, suggesting ongoing ischemia . A small right Sylvian hyperdensity is also seen, suggesting contrast extravasation with likely associated SAH.  She returned to ICU intubated.  MRI showed right MCA large infarct.  MRA showed right M2 persistent occlusion.   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 ASPECTS is 8.  CTA head & neck Right MCA M2 segment occlusion with poor collateralization. S/p IR with temporary recanalization with subsequent reocclusion (TICI 2A)  CT Head- Right perisylvian SAH/contrast has diffused but not clearly increased. Continue contrast blush extensively within right  anterior frontal and temporal cortex and in the right basal ganglia ischemia, ACA and MCA territory is involved. Additional cytotoxic edema in the lateral and high right frontal cortex. MRI Brain- Large area of early subacute ischemia throughout the posterior right MCA territory. Mass effect on the right lateral ventricle with trace leftward midline shift. MRA Brain-  No flow related enhancement seen in the right MCA M2 branches aside from the most anterior part of the superior division, unchanged from yesterday's CTA. 2D Echo EF 60-65%  LDL 174 HgbA1c 5.5 UDS cocaine positive VTE prophylaxis - heparin subq No antithrombotic prior to admission, now on  ASA 325 Therapy recommendations: Pending Disposition: Pending  Cerebral edema with brain compression Hypertonic saline initiated at 60m/hr- Now off  Sodium Q6 hr  Na 139 -> 140->145 -> 151 -> 154 -> 148 -> 162 -> 158 PICC line ordered  Hypertension Home meds: Amlodipine Stable IV Cleviprex infusing BP goal 130-150  Hyperlipidemia Home meds: Atorvastatin 20 mg LDL 174, goal < 70 Put on crestor 40 Continue statin at discharge  Acute Hypoxic respiratory failure  CCM on board Remains intubated post procedure Wean to extubate per CCM team  Cocaine abuse Hx of cocaine use UDS positive for cocaine this time Cessation education will be provided.   Tobacco abuse Current smoker Smoking cessation counseling will be provided  Alcohol abuse Per daughter, she is drinking frequently Not compliant with medication with drinking CIWA protocol   Other Stroke Risk Factors Obesity, Body mass index is 40.34 kg/m., BMI >/= 30 associated with increased stroke risk, recommend weight loss, diet and exercise as appropriate  Hx of stroke Congestive heart failure  Other Active Problems Depression Sertraline 50 mg Hypokalemia Repleted, K now 3.9 Leukocytosis  WBC 11.8 -> 15  Hospital day # 5  She remains critically ill with  respiratory failure and significant neurological deficits and is not developing renal failure as well.  Patient `s neurological exam limited due to sedation    Hypertonic saline is off the serum sodium is above goal at 160.  We will let it drift down naturally.  Antibiotics per critical care team for presumed ventilatory management and management of possible aspiration as per critical care team.   I had a long discussion with her brothers at the bedside.  They understand that her prognosis quite guarded and she is unlikely to survive without prolonged ventilatory support, feeding tube and rehabilitation and nursing home.  They are not sure patient would want this.  The patient's daughter seems to have a different opinion and so recommend palliative care consult for the family to meet together and discuss goals of care.  Discussed with Dr. NFranchot Heidelbergcritical care medicine. This patient is critically ill and at significant risk of neurological worsening, death and care requires constant monitoring of vital signs, hemodynamics,respiratory and cardiac monitoring, extensive review of multiple databases, frequent neurological assessment, discussion with family, other specialists and medical decision making of high complexity.I have made any additions or clarifications directly to the above note.This critical care time does not reflect procedure time, or teaching time or supervisory time of PA/NP/Med Resident etc but could involve care discussion time.  I spent 35 minutes of neurocritical care time  in the care of  this patient.     PAntony Contras MD Medical Director [facility] Pager: [phone]/19/2023 3:26 PM   To contact Stroke Continuity provider, please refer to [email or web address] After hours, contact General Neurology

## 2022-10-09 NOTE — Progress Notes (Signed)
SLP Cancellation Note  Patient Details Name: Taylor Vazquez MRN: [id number] DOB: 08/29/1966   Cancelled treatment:       Reason Eval/Treat Not Completed: Patient not medically ready. Will sign off at this time.    Carroll Ranney, Katherene Ponto 10/09/2022, 9:01 AM

## 2022-10-09 NOTE — Progress Notes (Signed)
OT Cancellation Note  Patient Details Name: Taylor Vazquez MRN: [id number] DOB: [birth year]   Cancelled Treatment:    Reason Eval/Treat Not Completed: Patient not medically ready. Pt remains intubated, sedated, and on bedrest,  [place], OTR/L [facility] [phone] Office [phone]    [name] 10/09/2022, 7:13 AM

## 2022-10-09 NOTE — Progress Notes (Addendum)
NAME:  RAECHELLE SARTI, MRN:  [id number], DOB:  13-Jan-1966, LOS: 5 ADMISSION DATE:  10/07/2022, CONSULTATION DATE:  10/09/22 REFERRING MD: Rory Percy  , CHIEF COMPLAINT:  Code stroke   History of Present Illness:  Ketina Mars is a 56 y.o. F with PMH significant for asthma, depression, ETOH use, HTN, Sickle cell trait , HFpEF who presented to the ED after being found by her daughter with flaccid L side and R gaze deviation.  Her LKW was 1830 12/14 found at 1925.  She had a normal glucose and was brought to the ED as a code stroke.  She was initially hypertensive 200/100's.  The decision was made to administer TNK.  Imaging showed R MCA M2 occlusion and so interventional radiology consulted.  Pt was combative in the ED requiring ativan.  Labs were significant for K 3.2, Creatinine 1.6.    Mechanical thrombectomy was performed with TICI 2A with temporary recanalization, but then subsequent re-occlusion.  Repeat imaging with contrast extravasation and likely SAH.  She was left intubated and PCCM consulted.   Pertinent  Medical History   has a past medical history of Asthma, CHF (congestive heart failure) ([place]), Depression, Hypertension, Obesity, Sickle cell trait ([place]), and Stroke ([place]).   Significant Hospital Events: Including procedures, antibiotic start and stop dates in addition to other pertinent events   12/14 presented as a code stroke, R MCA M2 occlusion, taken to IR, possible SAH post-procedure and left intubated 12/15 MRI completed  12/18 CPAP PS 5/5 and 40%,  Interim History / Subjective:  Worsening AKI with little UOP overnight  No other acute issues  Objective   Blood pressure 132/82, pulse 99, temperature 99.9 F (37.7 C), temperature source Axillary, resp. rate 18, height '5\' 3"'$  (1.6 m), weight 103.3 kg, SpO2 95 %.    Vent Mode: PSV;CPAP FiO2 (%):  [40 %] 40 % PEEP:  [5 cmH20] 5 cmH20 Pressure Support:  [5 cmH20] 5 cmH20   Intake/Output Summary (Last 24 hours) at  10/09/2022 8366 Last data filed at 10/09/2022 0700 Gross per 24 hour  Intake 1687.77 ml  Output 1100 ml  Net 587.77 ml   Filed Weights   10/09/2022 1900 10/06/22 0500 10/07/22 0500  Weight: 102.6 kg 90.4 kg 103.3 kg    General appearance: 56 y.o., female, intubated Eyes: pupils pinpoint Lungs: mech breath sounds bl, with normal respiratory effort CV: RRR, no murmur  Abdomen: Soft, non-tender; non-distended, BS present  Extremities: 1+ dependent edema, warm Skin: Normal turgor and texture; no rash Neuro: opens eyes with noxious stim on right, moves RUE spontaneously    Labs/imaging reviewed  Na 160, high chloride  Rising BUN/SCr Resp cx pending  CXR interval increase in bilateral lower lobe alveolar opacities  Resolved Hospital Problem list     Assessment & Plan:    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  Complicated by Post-procedure Perisylvian SAH W/ right anterior frontal and temporal cortex, right basal ganglia ischemia Cytotoxic cerebral edema  S/p thrombectomy with TICI 2a recanalization and stent retriever and aspiration but with subsequent re-occlusion and concern for SAH, left intubated P: - HTS infusion off - crestor for HLD as below - Neuroprotective measures: HOB > 30 degrees, normoglycemia, normothermia, eucapnia, correct electrolytes  Acute hypoxemic respiratory failure  Possible aspiration pneurmonia/pneumonitis - Sputum culture GS with abundant WBC, abundant Gram negative rods, moderate gram + cocci in pairs, culture pending  - Continue  Unasyn - pulmonary hygiene - PAD guidelines sedation's with fentanyl plus  propofol - PS trials as tolerated but mental status precludes extubation without clear plan for reintubation/trach vs DNI  AKI on CKD - LR bolus - IVC surprisingly collapsible/narrow on bedside US today - limit nephrotoxic medications - renal US  HTN Chronic diastolic heart failure - Holding home BP meds - Cleviprex for BP control,  systolic blood pressure 216-244  Type 2 DM -Holding metformin, continue SSI with CBGs  Hypokalemia / Hypernatremia - Replete/ treat  as needed - Trend BMET - Maintain Mag > 2  Tube feeds - Continue tube feeds - CBG and SSI  Goals of Care Pt. Has had a devastating stroke, will continue to monitor for neurological improvement, palliative care following .   Best Practice (right click and "Reselect all SmartList Selections" daily)   Diet/type: tubefeeds DVT prophylaxis: prophylactic heparin  GI prophylaxis: PPI Lines: N/A Foley:  N/A Code Status:  full code Last date of multidisciplinary goals of care discussion [per primary stroke team.   This patient is critically ill with multiple organ system failure; which, requires frequent high complexity decision making, assessment, support, evaluation, and titration of therapies. This was completed through the application of advanced monitoring technologies and extensive interpretation of multiple databases. During this encounter critical care time was devoted to patient care services described in this note for 40 minutes.  Fredirick Maudlin Pulmonary/Critical Care  PCCM on call pager [phone]  [place] [facility] 10/09/2022 8:08 AM

## 2022-10-09 NOTE — Progress Notes (Signed)
SLP Cancellation Note  Patient Details Name: Taylor Vazquez MRN: [id number] DOB: September 01, 1966   Cancelled treatment:       Reason Eval/Treat Not Completed: Patient not medically ready   Yuki Purves, Katherene Ponto 10/09/2022, 8:58 AM

## 2022-10-10 LAB — BASIC METABOLIC PANEL
Anion gap: 12 (ref 5–15)
BUN: 81 mg/dL — ABNORMAL HIGH (ref 6–20)
BUN: 86 mg/dL — ABNORMAL HIGH (ref 6–20)
CO2: 20 mmol/L — ABNORMAL LOW (ref 22–32)
CO2: 19 mmol/L — ABNORMAL LOW (ref 22–32)
Calcium: 8.5 mg/dL — ABNORMAL LOW (ref 8.9–10.3)
Calcium: 8.4 mg/dL — ABNORMAL LOW (ref 8.9–10.3)
Chloride: >130 mmol/L (ref 98–111)
Chloride: 128 mmol/L — ABNORMAL HIGH (ref 98–111)
Creatinine, Ser: 3.6 mg/dL — ABNORMAL HIGH (ref 0.44–1.00)
Creatinine, Ser: 3.83 mg/dL — ABNORMAL HIGH (ref 0.44–1.00)
GFR, Estimated: 14 mL/min — ABNORMAL LOW (ref >60)
GFR, Estimated: 13 mL/min — ABNORMAL LOW (ref >60)
Glucose, Bld: 147 mg/dL — ABNORMAL HIGH (ref 70–99)
Glucose, Bld: 147 mg/dL — ABNORMAL HIGH (ref 70–99)
Potassium: 3.9 mmol/L (ref 3.5–5.1)
Potassium: 4.1 mmol/L (ref 3.5–5.1)
Sodium: 161 mmol/L (ref 135–145)
Sodium: 159 mmol/L — ABNORMAL HIGH (ref 135–145)

## 2022-10-10 LAB — GLUCOSE, CAPILLARY
Glucose-Capillary: 157 mg/dL — ABNORMAL HIGH (ref 70–99)
Glucose-Capillary: 144 mg/dL — ABNORMAL HIGH (ref 70–99)
Glucose-Capillary: 125 mg/dL — ABNORMAL HIGH (ref 70–99)
Glucose-Capillary: 133 mg/dL — ABNORMAL HIGH (ref 70–99)
Glucose-Capillary: 152 mg/dL — ABNORMAL HIGH (ref 70–99)
Glucose-Capillary: 143 mg/dL — ABNORMAL HIGH (ref 70–99)

## 2022-10-10 LAB — SODIUM: Sodium: 164 mmol/L (ref 135–145)

## 2022-10-10 LAB — SODIUM, URINE, RANDOM: Sodium, Ur: 86 mmol/L

## 2022-10-10 LAB — CREATININE, URINE, RANDOM: Creatinine, Urine: 42 mg/dL

## 2022-10-10 LAB — OSMOLALITY, URINE: Osmolality, Ur: 453 mOsm/kg (ref 300–900)

## 2022-10-10 LAB — OSMOLALITY: Osmolality: 375 mOsm/kg (ref 275–295)

## 2022-10-10 MED ORDER — POLYETHYLENE GLYCOL 3350 17 G PO PACK: 17.0000 g | PACK | Freq: Every day | ORAL | Status: DC | PRN

## 2022-10-10 MED ORDER — ALBUTEROL SULFATE (2.5 MG/3ML) 0.083% IN NEBU
INHALATION_SOLUTION | RESPIRATORY_TRACT | Status: AC
  Administered 2022-10-10: 2.5 mg
  Filled 2022-10-10: qty 3

## 2022-10-10 MED ORDER — ALBUTEROL SULFATE (2.5 MG/3ML) 0.083% IN NEBU
INHALATION_SOLUTION | RESPIRATORY_TRACT | Status: AC
  Filled 2022-10-10: qty 3

## 2022-10-10 MED ORDER — DEXTROSE 5 % IV SOLN: INTRAVENOUS | Status: DC

## 2022-10-10 MED ORDER — DOCUSATE SODIUM 50 MG/5ML PO LIQD: 100.0000 mg | Freq: Two times a day (BID) | ORAL | Status: DC | PRN

## 2022-10-10 NOTE — Progress Notes (Addendum)
STROKE TEAM PROGRESS NOTE   INTERVAL HISTORY Palliative care consulted yesterday. Neurologically no change, fentanyl is still on at 12mg/hr.  Remains intubated, weaning 5/5 neurological exam is limited due to sedation Cr increased to 3.60  Vitals:   10/10/22 0700 10/10/22 0800 10/10/22 0817 10/10/22 1114  BP: (!) 165/98 115/67 115/67 108/65  Pulse: (!) 115 96 (!) 109 88  Resp: 19 17 (!) 23 18  Temp:  100.2 F (37.9 C)    TempSrc:  Axillary    SpO2: 97% 97% 92% 96%  Weight:      Height:       CBC:  Recent Labs  Lab 10/09/2022 1945 10/13/2022 1946 10/08/22 0605 10/09/22 0619  WBC 8.8   < > 16.8* 14.6*  NEUTROABS 3.8  --   --  10.8*  HGB 16.5*   < > 13.5 13.1  HCT 46.9*   < > 41.9 39.9  MCV 87.2   < > 93.5 92.4  PLT 178   < > 171 176   < > = values in this interval not displayed.   Basic Metabolic Panel:  Recent Labs  Lab 10/06/22 0546 10/06/22 1543 10/06/22 2200 10/08/22 0605 10/09/22 0619 10/09/22 1830 10/10/22 0036 10/10/22 0920  NA 154*  154* 148*   < > 157* 160*   < > 164* 161*  K 4.1  --    < > 3.2* 3.6  --   --  3.9  CL 128*  --    < > 129* >130*  --   --  >130*  CO2 20*  --    < > 17* 18*  --   --  20*  GLUCOSE 127*  --    < > 122* 160*  --   --  147*  BUN 41*  --    < > 59* 74*  --   --  81*  CREATININE 1.97*  --    < > 2.79* 3.21*  --   --  3.60*  CALCIUM 7.8*  --    < > 8.5* 8.8*  --   --  8.5*  MG 2.7* 2.5*  --  2.7* 2.9*  --   --   --   PHOS 4.5 3.2  --   --   --   --   --   --    < > = values in this interval not displayed.   Lipid Panel:  Recent Labs  Lab 10/05/22 0510 10/06/22 0546 10/09/22 0619  CHOL 252*  --   --   TRIG 126   < > 311*  HDL 53  --   --   CHOLHDL 4.8  --   --   VLDL 25  --   --   LDLCALC 174*  --   --    < > = values in this interval not displayed.   HgbA1c:  Recent Labs  Lab 10/05/22 0510  HGBA1C 5.6   Urine Drug Screen:  Recent Labs  Lab 10/05/22 1749  LABOPIA NONE DETECTED  COCAINSCRNUR POSITIVE*   LABBENZ NONE DETECTED  AMPHETMU NONE DETECTED  THCU NONE DETECTED  LABBARB NONE DETECTED    Alcohol Level  Recent Labs  Lab 10/10/2022 1939  ETH <10    IMAGING past 24 hours No results found.  PHYSICAL EXAM  Physical Exam  Constitutional: Appears well-developed and well-nourished.  Middle-aged African-American lady who is intubated and sedated Cardiovascular: Normal rate and regular rhythm.  Respiratory: Mechanically ventilated  Neuro:  Mental Status: Intubated, sedated, opens eyes to noxious stimuli.  Not following any commands  Cranial Nerves: II: Pupils are equal and sluggish III,IV, VI: Eyes are midline X: Cough and gag intact XI: Head is turned to the right Motor: Tone is normal. Bulk is normal. Spontaneous movement noted in right upper extremity Movement with painful stimuli in right lower extremity and left upper extremity Flicker of movement noted in left lower extremity Sensory: Localizes to pain in bilateral upper extremities and right lower extremity, no reaction to painful stimuli in left lower extremity    ASSESSMENT/PLAN Ms. CIJI BOSTON is a 56 y.o. female with history of CHF, hypertension, obesity, sickle cell trait, prior stroke, cocaine use, presenting for evaluation of sudden onset of left-sided weakness with weakness last known well at 6 PM.  She was given TNK and emergently taken for a mechanical thrombectomy of her M2 MCA posterior division branch.  They achieved temporary recanalization with subsequent reocclusion.  Flat panel CT showed intense contrast blushing and edema of the right frontal and anterior temporal cortex and basal ganglia, suggesting ongoing ischemia . A small right Sylvian hyperdensity is also seen, suggesting contrast extravasation with likely associated SAH.  She returned to ICU intubated.  MRI showed right MCA large infarct.  MRA showed right M2 persistent occlusion.   Stroke: Right MCA infarct due to right M2 occlusion s/p  TNK and unsuccessful IR with TICI2a and small SAH Code Stroke CT head No intracranial hemorrhage. ASPECTS is 8.  CTA head & neck Right MCA M2 segment occlusion with poor collateralization. S/p IR with temporary recanalization with subsequent reocclusion (TICI 2A)  CT Head- Right perisylvian SAH/contrast has diffused but not clearly increased. Continue contrast blush extensively within right anterior frontal and temporal cortex and in the right basal ganglia ischemia, ACA and MCA territory is involved. Additional cytotoxic edema in the lateral and high right frontal cortex. MRI Brain- Large area of early subacute ischemia throughout the posterior right MCA territory. Mass effect on the right lateral ventricle with trace leftward midline shift. MRA Brain-  No flow related enhancement seen in the right MCA M2 branches aside from the most anterior part of the superior division, unchanged from yesterday's CTA. 2D Echo EF 60-65%  LDL 174 HgbA1c 5.5 UDS cocaine positive VTE prophylaxis - heparin subq No antithrombotic prior to admission, now on ASA 325 Therapy recommendations: Pending Disposition: Pending  Cerebral edema with brain compression Hypertonic saline initiated at 34m/hr- Now off  D5 549mhr ordered by CCM  Sodium Q6 hr  Na 139 -> 140->145 -> 151 -> 154 -> 148 -> 162 -> 158 -> 164 -> 161 PICC line ordered  Hypertension Home meds: Amlodipine Stable IV Cleviprex infusing BP goal 130-150  Hyperlipidemia Home meds: Atorvastatin 20 mg LDL 174, goal < 70 Put on crestor 40 Continue statin at discharge  Acute Hypoxic respiratory failure  CCM on board Remains intubated post procedure Wean to extubate per CCM team  Cocaine abuse Hx of cocaine use UDS positive for cocaine this time Cessation education will be provided.   Tobacco abuse Current smoker Smoking cessation counseling will be provided  Alcohol abuse Per daughter, she is drinking frequently Not compliant with  medication with drinking CIWA protocol   Other Stroke Risk Factors Obesity, Body mass index is 40.34 kg/m., BMI >/= 30 associated with increased stroke risk, recommend weight loss, diet and exercise as appropriate  Hx of stroke Congestive heart failure  Other Active Problems Depression Sertraline 50 mg  Hypokalemia Repleted, K now 3.9 Leukocytosis  WBC 11.8 -> 15 -> 14.6 AKI (baseline Cr 1.5) Cr 2.79-> 3.21 -> 3.60  Hospital day # 6  Patient seen and examined by NP/APP with MD. MD to update note as needed.   Janine Ores, DNP, FNP-BC [facility] Pager: [phone]  I have personally obtained history,examined this patient, reviewed notes, independently viewed imaging studies, participated in medical decision making and plan of care.ROS completed by me personally and pertinent positives fully documented  I have made any additions or clarifications directly to the above note. Agree with note above.  Patient remains critically ill in general medical condition seem to decline with worsening renal failure and continuing respiratory failure.  Family to meet with palliative care to decide on goals of care.  No family available at the bedside for discussion today.  Discussed with Dr. Erin Fulling critical care medicine This patient is critically ill and at significant risk of neurological worsening, death and care requires constant monitoring of vital signs, hemodynamics,respiratory and cardiac monitoring, extensive review of multiple databases, frequent neurological assessment, discussion with family, other specialists and medical decision making of high complexity.I have made any additions or clarifications directly to the above note.This critical care time does not reflect procedure time, or teaching time or supervisory time of PA/NP/Med Resident etc but could involve care discussion time.  I spent 30 minutes of neurocritical care time  in the care of  this patient.     Antony Contras, MD Medical Director [facility] Pager: [phone] 10/10/2022 3:01 PM   To contact Stroke Continuity provider, please refer to [email or web address] After hours, contact General Neurology

## 2022-10-10 NOTE — Progress Notes (Signed)
OT Cancellation Note  Patient Details Name: Taylor Vazquez MRN: [id number] DOB: 08/18/1966   Cancelled Treatment:    Reason Eval/Treat Not Completed: Patient not medically ready (Pt remains sedated/intubated. Will sign off at this time. Please reorder when medically ready. thanks)  [facility] 10/10/2022, 7:15 AM Maurie Boettcher, OT/L   Acute OT Clinical Specialist Acute Rehabilitation Services Pager [phone] Office [phone]

## 2022-10-10 NOTE — Progress Notes (Signed)
[facility] Progress Note Patient Name: RONNY RUDDELL DOB: 1966-05-11 MRN: [id number]   Date of Service  10/10/2022  HPI/Events of Note  Hypernatremia - Na+ = 164 --> 164. No improvement with the addition of free water per tube. Serum Osm = 375 and Urine Osm = 453. Urine not dilute enough to suggest central DI at this time.   eICU Interventions  Plan: Add D5W IV infusion at 50 mL/hour. Continue to trend Na+.     Intervention Category Major Interventions: Electrolyte abnormality - evaluation and management  Sten Dematteo [place] 10/10/2022, 3:25 AM

## 2022-10-10 NOTE — Progress Notes (Signed)
NAME:  NOLIE BIGNELL, MRN:  [id number], DOB:  07-14-1966, LOS: 6 ADMISSION DATE:  09/21/2022, CONSULTATION DATE:  10/10/22 REFERRING MD: Rory Percy  , CHIEF COMPLAINT:  Code stroke   History of Present Illness:  Destenee Guerry is a 56 y.o. F with PMH significant for asthma, depression, ETOH use, HTN, Sickle cell trait , HFpEF who presented to the ED after being found by her daughter with flaccid L side and R gaze deviation.  Her LKW was 1830 12/14 found at 1925.  She had a normal glucose and was brought to the ED as a code stroke.  She was initially hypertensive 200/100's.  The decision was made to administer TNK.  Imaging showed R MCA M2 occlusion and so interventional radiology consulted.  Pt was combative in the ED requiring ativan.  Labs were significant for K 3.2, Creatinine 1.6.    Mechanical thrombectomy was performed with TICI 2A with temporary recanalization, but then subsequent re-occlusion.  Repeat imaging with contrast extravasation and likely SAH.  She was left intubated and PCCM consulted.   Pertinent  Medical History   has a past medical history of Asthma, CHF (congestive heart failure) ([place]), Depression, Hypertension, Obesity, Sickle cell trait ([place]), and Stroke ([place]).   Significant Hospital Events: Including procedures, antibiotic start and stop dates in addition to other pertinent events   12/14 presented as a code stroke, R MCA M2 occlusion, taken to IR, possible SAH post-procedure and left intubated 12/15 MRI completed  12/18 CPAP PS 5/5 and 40%,  Interim History / Subjective:   No acute events overnight Family updated at the bedside  Objective   Blood pressure 115/67, pulse (!) 109, temperature 100.2 F (37.9 C), temperature source Axillary, resp. rate (!) 23, height '5\' 3"'$  (1.6 m), weight 103.3 kg, SpO2 92 %.    Vent Mode: PSV;CPAP FiO2 (%):  [40 %] 40 % PEEP:  [5 cmH20] 5 cmH20 Pressure Support:  [5 cmH20] 5 cmH20   Intake/Output Summary (Last 24 hours) at  10/10/2022 0959 Last data filed at 10/10/2022 0600 Gross per 24 hour  Intake 2599.23 ml  Output 2235 ml  Net 364.23 ml   Filed Weights   10/05/2022 1900 10/06/22 0500 10/07/22 0500  Weight: 102.6 kg 90.4 kg 103.3 kg    General appearance: 56 y.o., female, intubated Eyes: pupils pinpoint Lungs: mech breath sounds bl, with normal respiratory effort CV: RRR, no murmur  Abdomen: Soft, non-tender; non-distended, BS present  Extremities: 1+ dependent edema, warm Skin: Normal turgor and texture; no rash Neuro: opens eyes with noxious stim on right, moves RUE spontaneously   Renal US: bladder distention and right sided moderate hydronephrosis  Na 161, high chloride  Cr 3.6  Resolved Hospital Problem list     Assessment & Plan:   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  Complicated by Post-procedure Perisylvian SAH W/ right anterior frontal and temporal cortex, right basal ganglia ischemia Cytotoxic cerebral edema  S/p thrombectomy with TICI 2a recanalization and stent retriever and aspiration but with subsequent re-occlusion and concern for SAH, left intubated P: - HTS infusion off - crestor for HLD as below - Neuroprotective measures: HOB > 30 degrees, normoglycemia, normothermia, eucapnia, correct electrolytes  Acute hypoxemic respiratory failure  Possible aspiration pneurmonia/pneumonitis - Sputum culture GS with abundant WBC, abundant Gram negative rods, moderate gram + cocci in pairs, culture pending  - Continue  Unasyn - pulmonary hygiene - PAD guidelines sedation's with fentanyl plus propofol - PS trials as tolerated but  mental status precludes extubation without clear plan for reintubation/trach vs DNI  AKI on CKD Bladder distenstion Right Hydronephrosis - LR bolus - IVC surprisingly collapsible/narrow on bedside US today - limit nephrotoxic medications - place foley catheter  HTN Chronic diastolic heart failure - Holding home BP meds - Cleviprex for BP  control, systolic blood pressure 54-469  Type 2 DM -Holding metformin, continue SSI with CBGs  Hypokalemia / Hypernatremia - Replete/ treat  as needed - Trend BMET - Maintain Mag > 2  Tube feeds - Continue tube feeds - CBG and SSI  Goals of Care Pt. Has had a devastating stroke, will continue to monitor for neurological improvement, palliative care following .   Best Practice (right click and "Reselect all SmartList Selections" daily)   Diet/type: tubefeeds DVT prophylaxis: prophylactic heparin  GI prophylaxis: PPI Lines: N/A Foley:  N/A Code Status:  full code Last date of multidisciplinary goals of care discussion [per primary stroke team.   This patient is critically ill with multiple organ system failure; which, requires frequent high complexity decision making, assessment, support, evaluation, and titration of therapies. This was completed through the application of advanced monitoring technologies and extensive interpretation of multiple databases. During this encounter critical care time was devoted to patient care services described in this note for 40 minutes.  Freda Jackson, MD [facility] Office: [phone]   See Amion for personal pager PCCM on call pager [phone] until 7pm. Please call Elink 7p-7a. [phone]

## 2022-10-11 DIAGNOSIS — Z515 Encounter for palliative care: Secondary | ICD-10-CM

## 2022-10-11 DIAGNOSIS — Z7189 Other specified counseling: Secondary | ICD-10-CM

## 2022-10-11 DIAGNOSIS — Z9911 Dependence on respirator [ventilator] status: Secondary | ICD-10-CM

## 2022-10-11 DIAGNOSIS — Z66 Do not resuscitate: Secondary | ICD-10-CM

## 2022-10-11 LAB — CBC WITH DIFFERENTIAL/PLATELET
Abs Immature Granulocytes: 0.15 10*3/uL — ABNORMAL HIGH (ref 0.00–0.07)
Basophils Absolute: 0 10*3/uL (ref 0.0–0.1)
Basophils Relative: 0 %
Eosinophils Absolute: 0.5 10*3/uL (ref 0.0–0.5)
Eosinophils Relative: 5 %
HCT: 35.1 % — ABNORMAL LOW (ref 36.0–46.0)
Hemoglobin: 11.2 g/dL — ABNORMAL LOW (ref 12.0–15.0)
Immature Granulocytes: 1 %
Lymphocytes Relative: 16 %
Lymphs Abs: 1.9 10*3/uL (ref 0.7–4.0)
MCH: 29.9 pg (ref 26.0–34.0)
MCHC: 31.9 g/dL (ref 30.0–36.0)
MCV: 93.9 fL (ref 80.0–100.0)
Monocytes Absolute: 1.5 10*3/uL — ABNORMAL HIGH (ref 0.1–1.0)
Monocytes Relative: 13 %
Neutro Abs: 7.7 10*3/uL (ref 1.7–7.7)
Neutrophils Relative %: 65 %
Platelets: 151 10*3/uL (ref 150–400)
RBC: 3.74 MIL/uL — ABNORMAL LOW (ref 3.87–5.11)
RDW: 15.6 % — ABNORMAL HIGH (ref 11.5–15.5)
WBC: 11.7 10*3/uL — ABNORMAL HIGH (ref 4.0–10.5)
nRBC: 0 % (ref 0.0–0.2)

## 2022-10-11 LAB — GLUCOSE, CAPILLARY
Glucose-Capillary: 142 mg/dL — ABNORMAL HIGH (ref 70–99)
Glucose-Capillary: 141 mg/dL — ABNORMAL HIGH (ref 70–99)
Glucose-Capillary: 158 mg/dL — ABNORMAL HIGH (ref 70–99)
Glucose-Capillary: 135 mg/dL — ABNORMAL HIGH (ref 70–99)
Glucose-Capillary: 129 mg/dL — ABNORMAL HIGH (ref 70–99)
Glucose-Capillary: 152 mg/dL — ABNORMAL HIGH (ref 70–99)

## 2022-10-11 LAB — COMPREHENSIVE METABOLIC PANEL
ALT: 25 U/L (ref 0–44)
AST: 35 U/L (ref 15–41)
Albumin: 1.7 g/dL — ABNORMAL LOW (ref 3.5–5.0)
Alkaline Phosphatase: 122 U/L (ref 38–126)
Anion gap: 5 (ref 5–15)
BUN: 82 mg/dL — ABNORMAL HIGH (ref 6–20)
CO2: 19 mmol/L — ABNORMAL LOW (ref 22–32)
Calcium: 7.7 mg/dL — ABNORMAL LOW (ref 8.9–10.3)
Chloride: 128 mmol/L — ABNORMAL HIGH (ref 98–111)
Creatinine, Ser: 3.77 mg/dL — ABNORMAL HIGH (ref 0.44–1.00)
GFR, Estimated: 14 mL/min — ABNORMAL LOW (ref >60)
Glucose, Bld: 165 mg/dL — ABNORMAL HIGH (ref 70–99)
Potassium: 3.9 mmol/L (ref 3.5–5.1)
Sodium: 152 mmol/L — ABNORMAL HIGH (ref 135–145)
Total Bilirubin: 0.3 mg/dL (ref 0.3–1.2)
Total Protein: 6 g/dL — ABNORMAL LOW (ref 6.5–8.1)

## 2022-10-11 LAB — MAGNESIUM: Magnesium: 2.5 mg/dL — ABNORMAL HIGH (ref 1.7–2.4)

## 2022-10-11 MED ORDER — DEXMEDETOMIDINE HCL IN NACL 400 MCG/100ML IV SOLN
0.4000 ug/kg/h | INTRAVENOUS | Status: DC
  Administered 2022-10-11: 0.9 ug/kg/h via INTRAVENOUS
  Administered 2022-10-11: 0.4 ug/kg/h via INTRAVENOUS
  Administered 2022-10-11: 0.8 ug/kg/h via INTRAVENOUS
  Administered 2022-10-11: 0.4 ug/kg/h via INTRAVENOUS
  Administered 2022-10-12: 0.9 ug/kg/h via INTRAVENOUS
  Administered 2022-10-12: 0.4 ug/kg/h via INTRAVENOUS
  Administered 2022-10-12: 0.9 ug/kg/h via INTRAVENOUS
  Administered 2022-10-13: 0.6 ug/kg/h via INTRAVENOUS
  Administered 2022-10-13 (×3): 0.5 ug/kg/h via INTRAVENOUS
  Administered 2022-10-14: 0.6 ug/kg/h via INTRAVENOUS
  Administered 2022-10-14 – 2022-10-15 (×2): 0.5 ug/kg/h via INTRAVENOUS
  Filled 2022-10-11 (×9): qty 100
  Filled 2022-10-11: qty 200
  Filled 2022-10-11 (×4): qty 100

## 2022-10-11 MED ORDER — ALBUTEROL SULFATE (2.5 MG/3ML) 0.083% IN NEBU
2.5000 mg | INHALATION_SOLUTION | RESPIRATORY_TRACT | Status: DC | PRN
  Administered 2022-10-11: 2.5 mg via RESPIRATORY_TRACT
  Filled 2022-10-11: qty 3

## 2022-10-11 MED ORDER — FUROSEMIDE 10 MG/ML IJ SOLN
40.0000 mg | Freq: Once | INTRAMUSCULAR | Status: AC
  Administered 2022-10-11: 40 mg via INTRAVENOUS
  Filled 2022-10-11: qty 4

## 2022-10-11 NOTE — Progress Notes (Signed)
NAME:  Taylor Vazquez, MRN:  [id number], DOB:  08-18-1966, LOS: 7 ADMISSION DATE:  10/02/2022, CONSULTATION DATE:  10/11/22 REFERRING MD: Rory Percy  , CHIEF COMPLAINT:  Code stroke   History of Present Illness:  Taylor Vazquez is a 56 y.o. F with PMH significant for asthma, depression, ETOH use, HTN, Sickle cell trait , HFpEF who presented to the ED after being found by her daughter with flaccid L side and R gaze deviation.  Her LKW was 1830 12/14 found at 1925.  She had a normal glucose and was brought to the ED as a code stroke.  She was initially hypertensive 200/100's.  The decision was made to administer TNK.  Imaging showed R MCA M2 occlusion and so interventional radiology consulted.  Pt was combative in the ED requiring ativan.  Labs were significant for K 3.2, Creatinine 1.6.    Mechanical thrombectomy was performed with TICI 2A with temporary recanalization, but then subsequent re-occlusion.  Repeat imaging with contrast extravasation and likely SAH.  She was left intubated and PCCM consulted.   Pertinent  Medical History   has a past medical history of Asthma, CHF (congestive heart failure) ([place]), Depression, Hypertension, Obesity, Sickle cell trait ([place]), and Stroke ([place]).   Significant Hospital Events: Including procedures, antibiotic start and stop dates in addition to other pertinent events   12/14 presented as a code stroke, R MCA M2 occlusion, taken to IR, possible SAH post-procedure and left intubated 12/15 MRI completed  12/18 CPAP PS 5/5 and 40%,  Interim History / Subjective:   No acute events overnight Family met with palliative care today. Code status changed to DNR. Will continue current level of care.   Objective   Blood pressure (!) 142/68, pulse (!) 102, temperature 100 F (37.8 C), temperature source Axillary, resp. rate 19, height _0  (1.6 m), weight 103.3 kg, SpO2 99 %.    Vent Mode: PRVC FiO2 (%):  [40 %] 40 % Set Rate:  [18 bmp] 18 bmp Vt Set:   [420 mL] 420 mL PEEP:  [5 cmH20] 5 cmH20 Pressure Support:  [5 cmH20] 5 cmH20 Plateau Pressure:  [17 cmH20] 17 cmH20   Intake/Output Summary (Last 24 hours) at 10/11/2022 0708 Last data filed at 10/11/2022 9030 Gross per 24 hour  Intake 3643.76 ml  Output 1770 ml  Net 1873.76 ml   Filed Weights   09/24/2022 1900 10/06/22 0500 10/07/22 0500  Weight: 102.6 kg 90.4 kg 103.3 kg    General appearance: 56 y.o., female, intubated Eyes: PERRL Lungs: mech breath sounds bl, with normal respiratory effort CV: RRR, no murmur  Abdomen: Soft, non-tender; non-distended, BS present  Extremities: no edema, warm Skin: Normal turgor and texture; no rash Neuro: does not follow commands  Renal US: bladder distention and right sided moderate hydronephrosis  Na 161, high chloride  Cr 3.6  Resolved Hospital Problem list     Assessment & Plan:   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  Complicated by Post-procedure Perisylvian SAH W/ right anterior frontal and temporal cortex, right basal ganglia ischemia Cytotoxic cerebral edema  S/p thrombectomy with TICI 2a recanalization and stent retriever and aspiration but with subsequent re-occlusion and concern for SAH, left intubated P: - HTS infusion off - crestor for HLD as below - Neuroprotective measures: HOB > 30 degrees, normoglycemia, normothermia, eucapnia, correct electrolytes  Acute hypoxemic respiratory failure  Possible aspiration pneurmonia/pneumonitis - Sputum culture GS with abundant WBC, abundant Gram negative rods, moderate gram + cocci in  pairs, culture pending  - Continue  Unasyn - pulmonary hygiene - PAD guidelines sedation's with fentanyl plus propofol - PS trials as tolerated but mental status precludes extubation without clear plan for reintubation/trach vs DNI  AKI on CKD Bladder distenstion Right Hydronephrosis - limit nephrotoxic medications - place foley catheter  HTN Chronic diastolic heart failure - Holding  home BP meds - Cleviprex for BP control, systolic blood pressure [phone] - give Lasix 58m x 1 today  Type 2 DM -Holding metformin, continue SSI with CBGs  Hypokalemia / Hypernatremia - Replete/ treat  as needed - Trend BMET - Maintain Mag > 2  Tube feeds - Continue tube feeds - CBG and SSI  Goals of Care Pt. Has had a devastating stroke, will continue to monitor for neurological improvement, palliative care following . Patient made DNR today.    Best Practice (right click and "Reselect all SmartList Selections" daily)   Diet/type: tubefeeds DVT prophylaxis: prophylactic heparin  GI prophylaxis: PPI Lines: N/A Foley:  N/A Code Status:  full code Last date of multidisciplinary goals of care discussion [per primary stroke team.   This patient is critically ill with multiple organ system failure; which, requires frequent high complexity decision making, assessment, support, evaluation, and titration of therapies. This was completed through the application of advanced monitoring technologies and extensive interpretation of multiple databases. During this encounter critical care time was devoted to patient care services described in this note for 40 minutes.  JFreda Jackson MD [facility]: [phone]  See Amion for personal pager PCCM on call pager [phone] 7pm. Please call Elink 7p-7a. [phone]

## 2022-10-11 NOTE — Consult Note (Signed)
Consultation Note Date: 10/11/2022   Patient Name: Taylor Vazquez  DOB: 1965/12/23  MRN: [id number]  Age / Sex: 56 y.o., female  PCP: Charlott Rakes, MD Referring Physician: Stroke, Md, MD  Reason for Consultation: Establishing goals of care  HPI/Patient Profile: 56 y.o. female  with past medical history of asthma, depression, ETOH use, HTN, Sickle cell trait , and HFpEF admitted on 10/20/2022 with stroke. Mechanical thrombectomy was performed with TICI 2A with temporary recanalization, but then subsequent re-occlusion. Repeat imaging with contrast extravasation and likely SAH.  Patient also with acute respiratory failure.  Likely has aspiration pneumonia/pneumonitis.  Mental status precludes extubation.  Patient also with AKI on CKD.  PMT consulted to discuss goals of care.  Clinical Assessment and Goals of Care: I have reviewed medical records including EPIC notes, labs and imaging, received report from RN, assessed the patient and then met with patient's daughter Taylor Vazquez to discuss diagnosis prognosis, GOC, EOL wishes, disposition and options.  I introduced Palliative Medicine as specialized medical care for people living with serious illness. It focuses on providing relief from the symptoms and stress of a serious illness. The goal is to improve quality of life for both the patient and the family.  We discussed a brief life review of the patient.  Taylor Vazquez shares about patient's struggles prior to stroke.  She feels patient was very depressed and struggled with substance abuse.  She shares about other psychiatric conditions.  The patient has 4 children however 1 has passed away and 1 is incarcerated.  As far as functional patient was able to independently care for herself prior to admission.   We discussed patient's current illness and what it means in the larger context of patient's on-going co-morbidities.  Natural disease trajectory and  expectations at EOL were discussed.  We discussed her devastating brain injury and respiratory failure.  I attempted to elicit values and goals of care important to the patient.  We discussed there were concerns about quality of life prior to this recent event and now this concerns are far greater.  Daughter is not sure that patient would want to continue with aggressive care if she would not be able to return to her baseline status.  The difference between aggressive medical intervention and comfort care was considered in light of the patient's goals of care.   Encouraged daughter to consider DNR status understanding evidenced based poor outcomes in similar hospitalized patients, as the cause of the arrest is likely associated with terminal disease rather than a reversible acute cardio-pulmonary event.  Daughter agrees to DNR status.  Discussed with daughter the importance of continued conversation with family and the medical providers regarding overall plan of care and treatment options, ensuring decisions are within the context of the patients values and GOCs.    Questions and concerns were addressed. The family was encouraged to call with questions or concerns.  Primary Decision Maker NEXT OF KIN -daughter Taylor Vazquez    SUMMARY OF RECOMMENDATIONS   CODE STATUS changed to DNR Continue full scope of care and allow time for outcomes, daughter considering a shift to comfort focused care but needs time  Code Status/Advance Care Planning: DNR      Primary Diagnoses: Present on Admission:  Acute ischemic stroke ([place])   I have reviewed the medical record, interviewed the patient and family, and examined the patient. The following aspects are pertinent.  Past Medical History:  Diagnosis Date   Asthma    CHF (congestive heart failure) ([place])  Depression    Hypertension    Obesity    Sickle cell trait ([place])    Stroke [facility])    Social History   Socioeconomic History   Marital  status: Single    Spouse name: Not on file   Number of children: 4   Years of education: Not on file   Highest education level: 12th grade  Occupational History   Occupation: PCA    Employer: [employer]  Tobacco Use   Smoking status: Some Days    Packs/day: 0.25    Types: Cigarettes   Smokeless tobacco: Never  Substance and Sexual Activity   Alcohol use: Not Currently    Alcohol/week: 0.0 standard drinks of alcohol    Comment: occ, previously drank (2) 40oz for 2-3 years, quit 2005   Drug use: No    Comment: denies crack since 3 or4 months   Sexual activity: Yes    Birth control/protection: None  Other Topics Concern   Not on file  Social History Narrative   Patient is right-handed. She is single. She lives in a first floor apartment. She walks to work each day, using a can or walker.   She works as a Programmer, applications.   Social Determinants of Health   Financial Resource Strain: Not on file  Food Insecurity: Not on file  Transportation Needs: Not on file  Physical Activity: Not on file  Stress: Not on file  Social Connections: Not on file   Family History  Problem Relation Age of Onset   Hypertension Mother    Hypertension Father    Cancer Father    Hypertension Other    Sickle cell trait Other    Diabetes Other    Diabetes Son    Other Neg Hx    Scheduled Meds:  amLODipine  10 mg Per Tube Daily   aspirin  325 mg Per Tube Daily   Chlorhexidine Gluconate Cloth  6 each Topical Daily   feeding supplement (PROSource TF20)  60 mL Per Tube Daily   fiber supplement (BANATROL TF)  60 mL Per Tube BID   free water  200 mL Per Tube Q2H   heparin injection (subcutaneous)  5,000 Units Subcutaneous Q8H   insulin aspart  0-9 Units Subcutaneous Q4H   mouth rinse  15 mL Mouth Rinse Q2H   pantoprazole (PROTONIX) IV  40 mg Intravenous QHS   rosuvastatin  40 mg Per Tube Daily   sodium chloride flush  10-40 mL Intracatheter Q12H   sodium chloride flush  3 mL Intravenous  Once   Continuous Infusions:  ampicillin-sulbactam (UNASYN) IV 3 g (10/11/22 1207)   dexmedetomidine (PRECEDEX) IV infusion 0.7 mcg/kg/hr (10/11/22 1219)   dextrose 50 mL/hr at 10/11/22 1200   feeding supplement (VITAL AF 1.2 CAL) 55 mL/hr at 10/11/22 1200   fentaNYL infusion INTRAVENOUS 150 mcg/hr (10/11/22 1200)   PRN Meds:.acetaminophen **OR** acetaminophen (TYLENOL) oral liquid 160 mg/5 mL **OR** acetaminophen, docusate, fentaNYL, labetalol, ondansetron (ZOFRAN) IV, mouth rinse, polyethylene glycol, senna-docusate, sodium chloride flush Allergies  Allergen Reactions   Bee Venom Anaphylaxis   Shellfish Allergy Anaphylaxis and Swelling   Review of Systems  Unable to perform ROS: Intubated    Physical Exam Constitutional:      General: She is not in acute distress.    Appearance: She is ill-appearing.     Comments: Sedated, ventilator dependent  Pulmonary:     Effort: Pulmonary effort is normal.  Skin:    General: Skin is warm  and dry.     Vital Signs: BP 110/71   Pulse 82   Temp 98.9 F (37.2 C) (Axillary)   Resp (!) 25   Ht _0  (1.6 m)   Wt 103.3 kg   SpO2 94%   BMI 40.34 kg/m  Pain Scale: CPOT   Pain Score: Asleep   SpO2: SpO2: 94 % O2 Device:SpO2: 94 % O2 Flow Rate: .   IO: Intake/output summary:  Intake/Output Summary (Last 24 hours) at 10/11/2022 1323 Last data filed at 10/11/2022 1200 Gross per 24 hour  Intake 3977.73 ml  Output 1350 ml  Net 2627.73 ml    LBM: Last BM Date : 10/11/22 Baseline Weight: Weight: 102.6 kg Most recent weight: Weight: 103.3 kg      *Please note that this is a verbal dictation therefore any spelling or grammatical errors are due to the "[employer]" system interpretation.   Juel Burrow, DNP, AGNP-C Palliative Medicine Team [phone] Pager: [phone]

## 2022-10-11 NOTE — Progress Notes (Addendum)
STROKE TEAM PROGRESS NOTE   INTERVAL HISTORY Patient is now DNR.  Palliative care meeting today, there is ongoing conversation within the family on transitioning to comfort care however they have requested more time.  Creatinine is 3.77 today.  White blood cell count 11.7. She remains intubated, neurologically stable, hemodynamically stable.  Vitals:   10/11/22 1300 10/11/22 1400 10/11/22 1500 10/11/22 1600  BP: 115/63 107/65 103/60 (!) 97/53  Pulse: 80 73 69 68  Resp: (!) 23 (!) 23 19 (!) 21  Temp:    98.5 F (36.9 C)  TempSrc:    Axillary  SpO2: 96% 93% 96% 96%  Weight:      Height:       CBC:  Recent Labs  Lab 10/09/22 0619 10/11/22 0905  WBC 14.6* 11.7*  NEUTROABS 10.8* 7.7  HGB 13.1 11.2*  HCT 39.9 35.1*  MCV 92.4 93.9  PLT 176 643    Basic Metabolic Panel:  Recent Labs  Lab 10/06/22 0546 10/06/22 1543 10/06/22 2200 10/09/22 0619 10/09/22 1830 10/10/22 1816 10/11/22 0905  NA 154*  154* 148*   < > 160*   < > 159* 152*  K 4.1  --    < > 3.6   < > 4.1 3.9  CL 128*  --    < > >130*   < > 128* 128*  CO2 20*  --    < > 18*   < > 19* 19*  GLUCOSE 127*  --    < > 160*   < > 147* 165*  BUN 41*  --    < > 74*   < > 86* 82*  CREATININE 1.97*  --    < > 3.21*   < > 3.83* 3.77*  CALCIUM 7.8*  --    < > 8.8*   < > 8.4* 7.7*  MG 2.7* 2.5*   < > 2.9*  --   --  2.5*  PHOS 4.5 3.2  --   --   --   --   --    < > = values in this interval not displayed.    Lipid Panel:  Recent Labs  Lab 10/05/22 0510 10/06/22 0546 10/09/22 0619  CHOL 252*  --   --   TRIG 126   < > 311*  HDL 53  --   --   CHOLHDL 4.8  --   --   VLDL 25  --   --   LDLCALC 174*  --   --    < > = values in this interval not displayed.    HgbA1c:  Recent Labs  Lab 10/05/22 0510  HGBA1C 5.6    Urine Drug Screen:  Recent Labs  Lab 10/05/22 1749  LABOPIA NONE DETECTED  COCAINSCRNUR POSITIVE*  LABBENZ NONE DETECTED  AMPHETMU NONE DETECTED  THCU NONE DETECTED  LABBARB NONE DETECTED      Alcohol Level  Recent Labs  Lab 09/21/2022 1939  ETH <10     IMAGING past 24 hours No results found.  PHYSICAL EXAM  Physical Exam  Constitutional: Appears well-developed and well-nourished.  Middle-aged African-American lady who is intubated and sedated Cardiovascular: Normal rate and regular rhythm.  Respiratory: Mechanically ventilated  Neuro: Mental Status: Intubated, sedated, opens eyes to noxious stimuli.  Not following any commands  Cranial Nerves: II: Pupils are equal and sluggish III,IV, VI: Eyes are midline X: Cough and gag intact XI: Head is turned to the right Motor: Tone is normal. Bulk is  normal. Spontaneous movement noted in right upper extremity Movement with painful stimuli in right lower extremity and left upper extremity Flicker of movement noted in left lower extremity Sensory: Localizes to pain in bilateral upper extremities and right lower extremity, no reaction to painful stimuli in left lower extremity    ASSESSMENT/PLAN Ms. SHAMICA MOREE is a 56 y.o. female with history of CHF, hypertension, obesity, sickle cell trait, prior stroke, cocaine use, presenting for evaluation of sudden onset of left-sided weakness with weakness last known well at 6 PM.  She was given TNK and emergently taken for a mechanical thrombectomy of her M2 MCA posterior division branch.  They achieved temporary recanalization with subsequent reocclusion.  Flat panel CT showed intense contrast blushing and edema of the right frontal and anterior temporal cortex and basal ganglia, suggesting ongoing ischemia . A small right Sylvian hyperdensity is also seen, suggesting contrast extravasation with likely associated SAH.  She returned to ICU intubated.  MRI showed right MCA large infarct.  MRA showed right M2 persistent occlusion.   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 ASPECTS is 8.  CTA head & neck Right MCA M2 segment occlusion with poor collateralization. S/p IR with temporary recanalization with subsequent reocclusion (TICI 2A)  CT Head- Right perisylvian SAH/contrast has diffused but not clearly increased. Continue contrast blush extensively within right anterior frontal and temporal cortex and in the right basal ganglia ischemia, ACA and MCA territory is involved. Additional cytotoxic edema in the lateral and high right frontal cortex. MRI Brain- Large area of early subacute ischemia throughout the posterior right MCA territory. Mass effect on the right lateral ventricle with trace leftward midline shift. MRA Brain-  No flow related enhancement seen in the right MCA M2 branches aside from the most anterior part of the superior division, unchanged from yesterday's CTA. 2D Echo EF 60-65%  LDL 174 HgbA1c 5.5 UDS cocaine positive VTE prophylaxis - heparin subq No antithrombotic prior to admission, now on ASA 325 Therapy recommendations: Pending Disposition: Pending  Cerebral edema with brain compression Hypertonic saline initiated at 19m/hr- Now off  D5 558mhr ordered by CCM  Sodium Q6 hr  Na 152 PICC line ordered  Hypertension Home meds: Amlodipine Stable IV Cleviprex infusing BP goal 130-150  Hyperlipidemia Home meds: Atorvastatin 20 mg LDL 174, goal < 70 Put on crestor 40 Continue statin at discharge  Acute Hypoxic respiratory failure  CCM on board Remains intubated post procedure Wean to extubate per CCM team  Cocaine abuse Hx of cocaine use UDS positive for cocaine this time Cessation education will be provided.   Tobacco abuse Current smoker Smoking cessation counseling will be provided  Alcohol abuse Per daughter, she is drinking frequently Not compliant with medication with drinking CIWA protocol   Other Stroke Risk Factors Obesity, Body mass index is 40.34 kg/m., BMI >/= 30 associated with increased  stroke risk, recommend weight loss, diet and exercise as appropriate  Hx of stroke Congestive heart failure  Other Active Problems Depression Sertraline 50 mg Hypokalemia Repleted, K now 3.9 Leukocytosis  WBC 11.8 -> 15 -> 14.6 -> 11.7 AKI (baseline Cr 1.5) Cr 2.79-> 3.21 -> 3.60 -> 3.80 -> 3.77  Hospital day # 7  Patient seen and examined by NP/APP with MD. MD to update note as needed.   DeJanine OresDNP, FNP-BC [facility] Pager: [phone] have  personally obtained history,examined this patient, reviewed notes, independently viewed imaging studies, participated in medical decision making and plan of care.ROS completed by me personally and pertinent positives fully documented  I have made any additions or clarifications directly to the above note. Agree with note above.  Patient's neurological status remains unchanged and prognosis seems quite poor.  She continues to have renal failure with creatinine 3.77 today and respiratory failure requiring mechanical ventilation.  Family has met with palliative care team and agreed to DNR but want to pursue ongoing medical care for now but will have conversations amongst family to transition to comfort care in the future.  Appreciate critical care team and palliative care team health.  No family at the bedside during rounds for discussion This patient is critically ill and at significant risk of neurological worsening, death and care requires constant monitoring of vital signs, hemodynamics,respiratory and cardiac monitoring, extensive review of multiple databases, frequent neurological assessment, discussion with family, other specialists and medical decision making of high complexity.I have made any additions or clarifications directly to the above note.This critical care time does not reflect procedure time, or teaching time or supervisory time of PA/NP/Med Resident etc but could involve care discussion time.  I spent 30 minutes  of neurocritical care time  in the care of  this patient.     Antony Contras, MD Medical Director [facility] Pager: [phone] 10/11/2022 5:25 PM  To contact Stroke Continuity provider, please refer to [email or web address] After hours, contact General Neurology

## 2022-10-12 ENCOUNTER — Inpatient Hospital Stay (HOSPITAL_COMMUNITY): Payer: Medicaid Other

## 2022-10-12 LAB — BASIC METABOLIC PANEL
Anion gap: 10 (ref 5–15)
Anion gap: 7 (ref 5–15)
BUN: 85 mg/dL — ABNORMAL HIGH (ref 6–20)
BUN: 90 mg/dL — ABNORMAL HIGH (ref 6–20)
CO2: 17 mmol/L — ABNORMAL LOW (ref 22–32)
CO2: 17 mmol/L — ABNORMAL LOW (ref 22–32)
Calcium: 7 mg/dL — ABNORMAL LOW (ref 8.9–10.3)
Calcium: 7.3 mg/dL — ABNORMAL LOW (ref 8.9–10.3)
Chloride: 116 mmol/L — ABNORMAL HIGH (ref 98–111)
Chloride: 122 mmol/L — ABNORMAL HIGH (ref 98–111)
Creatinine, Ser: 3.49 mg/dL — ABNORMAL HIGH (ref 0.44–1.00)
Creatinine, Ser: 3.66 mg/dL — ABNORMAL HIGH (ref 0.44–1.00)
GFR, Estimated: 15 mL/min — ABNORMAL LOW (ref >60)
GFR, Estimated: 14 mL/min — ABNORMAL LOW (ref >60)
Glucose, Bld: 350 mg/dL — ABNORMAL HIGH (ref 70–99)
Glucose, Bld: 171 mg/dL — ABNORMAL HIGH (ref 70–99)
Potassium: 3.5 mmol/L (ref 3.5–5.1)
Potassium: 3.4 mmol/L — ABNORMAL LOW (ref 3.5–5.1)
Sodium: 143 mmol/L (ref 135–145)
Sodium: 146 mmol/L — ABNORMAL HIGH (ref 135–145)

## 2022-10-12 LAB — GLUCOSE, CAPILLARY
Glucose-Capillary: 138 mg/dL — ABNORMAL HIGH (ref 70–99)
Glucose-Capillary: 155 mg/dL — ABNORMAL HIGH (ref 70–99)
Glucose-Capillary: 147 mg/dL — ABNORMAL HIGH (ref 70–99)
Glucose-Capillary: 119 mg/dL — ABNORMAL HIGH (ref 70–99)
Glucose-Capillary: 134 mg/dL — ABNORMAL HIGH (ref 70–99)
Glucose-Capillary: 137 mg/dL — ABNORMAL HIGH (ref 70–99)

## 2022-10-12 LAB — SODIUM: Sodium: 155 mmol/L — ABNORMAL HIGH (ref 135–145)

## 2022-10-12 LAB — MAGNESIUM: Magnesium: 2.3 mg/dL (ref 1.7–2.4)

## 2022-10-12 MED ORDER — POTASSIUM CHLORIDE 20 MEQ PO PACK
60.0000 meq | PACK | Freq: Once | ORAL | Status: AC
  Administered 2022-10-12: 60 meq
  Filled 2022-10-12: qty 3

## 2022-10-12 MED ORDER — SODIUM CHLORIDE 3% (HYPERTONIC SALINE) BOLUS VIA INFUSION: 75.0000 mL | INTRAVENOUS | Status: DC

## 2022-10-12 MED ORDER — SODIUM CHLORIDE 23.4 % INJECTION (4 MEQ/ML) FOR IV ADMINISTRATION
120.0000 meq | Freq: Once | INTRAVENOUS | Status: AC
  Administered 2022-10-12: 120 meq via INTRAVENOUS
  Filled 2022-10-12: qty 30

## 2022-10-12 MED ORDER — FUROSEMIDE 10 MG/ML IJ SOLN
40.0000 mg | Freq: Once | INTRAMUSCULAR | Status: AC
  Administered 2022-10-12: 40 mg via INTRAVENOUS
  Filled 2022-10-12: qty 4

## 2022-10-12 MED ORDER — SODIUM CHLORIDE 3 % IV SOLN
INTRAVENOUS | Status: DC
  Filled 2022-10-12 (×2): qty 500

## 2022-10-12 MED ORDER — SODIUM CHLORIDE 23.4 % INJECTION (4 MEQ/ML) FOR IV ADMINISTRATION: 120.0000 meq | Freq: Once | INTRAVENOUS | Status: DC

## 2022-10-12 NOTE — Progress Notes (Signed)
RN assessed pt at 2000 and pupils were unequal in size and sluggish. Pt's extremities are still unresponsive to pain. Called MD Leonel Ramsay. No CT scanned ordered due to patient's code status. Will continue to monitor for further neuro change.

## 2022-10-12 NOTE — Progress Notes (Signed)
Notified Dr. Leonel Ramsay about pt's sodium level.  At 1802 pt's sodium was 155.  This increased from 146 at 1045.  Verbal order to stop 3%.  D/c order per pharmacist to avoid confusion.  Will continue to trent sodium levels.

## 2022-10-12 NOTE — Progress Notes (Signed)
CSW received request from [employer] to have MD sign incapacity letter for [facility] application. MD signed; CSW sent back to [place] Ellerbe.   Gilmore Laroche, MSW, [facility]

## 2022-10-12 NOTE — Progress Notes (Signed)
NAME:  Taylor Vazquez, MRN:  [id number], DOB:  05-27-1966, LOS: 8 ADMISSION DATE:  10/21/2022, CONSULTATION DATE:  10/12/22 REFERRING MD: Rory Percy  , CHIEF COMPLAINT:  Code stroke   History of Present Illness:  Taylor Vazquez is a 56 y.o. F with PMH significant for asthma, depression, ETOH use, HTN, Sickle cell trait , HFpEF who presented to the ED after being found by her daughter with flaccid L side and R gaze deviation.  Her LKW was 1830 12/14 found at 1925.  She had a normal glucose and was brought to the ED as a code stroke.  She was initially hypertensive 200/100's.  The decision was made to administer TNK.  Imaging showed R MCA M2 occlusion and so interventional radiology consulted.  Pt was combative in the ED requiring ativan.  Labs were significant for K 3.2, Creatinine 1.6.    Mechanical thrombectomy was performed with TICI 2A with temporary recanalization, but then subsequent re-occlusion.  Repeat imaging with contrast extravasation and likely SAH.  She was left intubated and PCCM consulted.   Pertinent  Medical History   has a past medical history of Asthma, CHF (congestive heart failure) ([place]), Depression, Hypertension, Obesity, Sickle cell trait ([place]), and Stroke ([place]).   Significant Hospital Events: Including procedures, antibiotic start and stop dates in addition to other pertinent events   12/14 presented as a code stroke, R MCA M2 occlusion, taken to IR, possible SAH post-procedure and left intubated 12/15 MRI completed  12/18 CPAP PS 5/5 and 40%,  Interim History / Subjective:   No acute events overnight  Objective   Blood pressure 109/66, pulse 66, temperature 99.5 F (37.5 C), temperature source Oral, resp. rate (!) 22, height '5\' 3"'$  (1.6 m), weight 103.3 kg, SpO2 95 %.    Vent Mode: PRVC FiO2 (%):  [40 %] 40 % Set Rate:  [18 bmp] 18 bmp Vt Set:  [420 mL] 420 mL PEEP:  [5 cmH20] 5 cmH20 Pressure Support:  [5 cmH20] 5 cmH20 Plateau Pressure:  [19 cmH20-21  cmH20] 19 cmH20   Intake/Output Summary (Last 24 hours) at 10/12/2022 9735 Last data filed at 10/12/2022 0600 Gross per 24 hour  Intake 4672 ml  Output 2300 ml  Net 2372 ml   Filed Weights   09/27/2022 1900 10/06/22 0500 10/07/22 0500  Weight: 102.6 kg 90.4 kg 103.3 kg    General appearance: 56 y.o., female, intubated Eyes: PERRL Lungs: mech breath sounds bl CV: RRR, no murmur  Abdomen: Soft, non-tender; non-distended, BS present  Extremities: no edema, warm Skin: Normal turgor and texture; no rash Neuro: does not follow commands  Renal US 12/19: bladder distention and right sided moderate hydronephrosis  Na 146 Cr 3.66  Resolved Hospital Problem list     Assessment & Plan:   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  Complicated by Post-procedure Perisylvian SAH W/ right anterior frontal and temporal cortex, right basal ganglia ischemia Cytotoxic cerebral edema  S/p thrombectomy with TICI 2a recanalization and stent retriever and aspiration but with subsequent re-occlusion and concern for SAH, left intubated P: - HTS infusion off - crestor for HLD as below - Neuroprotective measures: HOB > 30 degrees, normoglycemia, normothermia, eucapnia, correct electrolytes  Acute hypoxemic respiratory failure  Possible aspiration pneurmonia/pneumonitis - Sputum culture GS with abundant WBC, abundant Gram negative rods, moderate gram + cocci in pairs, culture pending  - Continue  Unasyn - pulmonary hygiene - PAD guidelines sedation's with fentanyl plus propofol - PS trials as tolerated  but mental status precludes extubation without clear plan for reintubation/trach vs DNI  AKI on CKD Bladder distenstion Right Hydronephrosis - limit nephrotoxic medications - continue foley catheter - repeat renal US  HTN Chronic diastolic heart failure - Holding home BP meds - give Lasix '40mg'$  x 1 today  Type 2 DM -Holding metformin, continue SSI with CBGs  Hypokalemia /  Hypernatremia - Replete/ treat  as needed - Trend BMET - Maintain Mag > 2  Tube feeds - Continue tube feeds - CBG and SSI  Goals of Care Pt. Has had a devastating stroke, will continue to monitor for neurological improvement, palliative care following . Patient made DNR.   Best Practice (right click and "Reselect all SmartList Selections" daily)   Diet/type: tubefeeds DVT prophylaxis: prophylactic heparin  GI prophylaxis: PPI Lines: N/A Foley:  N/A Code Status:  full code Last date of multidisciplinary goals of care discussion [per primary stroke team.   This patient is critically ill with multiple organ system failure; which, requires frequent high complexity decision making, assessment, support, evaluation, and titration of therapies. This was completed through the application of advanced monitoring technologies and extensive interpretation of multiple databases. During this encounter critical care time was devoted to patient care services described in this note for 35 minutes.  Freda Jackson, MD [facility] Office: [phone]   See Amion for personal pager PCCM on call pager [phone] until 7pm. Please call Elink 7p-7a. [phone]

## 2022-10-12 NOTE — Progress Notes (Signed)
Pt placed on PS/CPAP 5/5 on 40% and is tolerating well. RT will monitor.

## 2022-10-12 NOTE — Progress Notes (Signed)
STROKE TEAM PROGRESS NOTE   INTERVAL HISTORY Patient is now DNR.  Palliative care meeting today, there is ongoing conversation within the family on transitioning to comfort care however they have requested more time.  Creatinine is 3.66 today.   7. She remains intubated, neurologically stable, hemodynamically stable. Right pupil is sluggishly reactive.  CT scan of the head was repeated which shows progressive cytotoxic edema with increased now 7 mm right to left shift.  Serum sodium is now down to 146.  Discussed with daughter over the phone.  Agrees with DNR but still wants appropriate medical care Vitals:   10/12/22 1400 10/12/22 1405 10/12/22 1430 10/12/22 1500  BP: (!) 92/58 104/66 (!) 88/54 94/61  Pulse: (!) 57 65 (!) 57 (!) 57  Resp: _0 Temp:      TempSrc:      SpO2: 100% 100%  98%  Weight:      Height:       CBC:  Recent Labs  Lab 10/09/22 0619 10/11/22 0905  WBC 14.6* 11.7*  NEUTROABS 10.8* 7.7  HGB 13.1 11.2*  HCT 39.9 35.1*  MCV 92.4 93.9  PLT 176 342   Basic Metabolic Panel:  Recent Labs  Lab 10/06/22 0546 10/06/22 1543 10/06/22 2200 10/11/22 0905 10/12/22 0734 10/12/22 1045  NA 154*  154* 148*   < > 152* 143 146*  K 4.1  --    < > 3.9 3.5 3.4*  CL 128*  --    < > 128* 116* 122*  CO2 20*  --    < > 19* 17* 17*  GLUCOSE 127*  --    < > 165* 350* 171*  BUN 41*  --    < > 82* 85* 90*  CREATININE 1.97*  --    < > 3.77* 3.49* 3.66*  CALCIUM 7.8*  --    < > 7.7* 7.0* 7.3*  MG 2.7* 2.5*   < > 2.5* 2.3  --   PHOS 4.5 3.2  --   --   --   --    < > = values in this interval not displayed.   Lipid Panel:  Recent Labs  Lab 10/09/22 0619  TRIG 311*   HgbA1c:  No results for input(s): "HGBA1C" in the last 168 hours. Urine Drug Screen:  Recent Labs  Lab 10/05/22 1749  LABOPIA NONE DETECTED  COCAINSCRNUR POSITIVE*  LABBENZ NONE DETECTED  AMPHETMU NONE DETECTED  THCU NONE DETECTED  LABBARB NONE DETECTED    Alcohol Level  No results for  input(s): "ETH" in the last 168 hours.  IMAGING past 24 hours CT HEAD WO CONTRAST (5MM)  Result Date: 10/12/2022 CLINICAL DATA:  Neuro deficit, acute, stroke suspected EXAM: CT HEAD WITHOUT CONTRAST TECHNIQUE: Contiguous axial images were obtained from the base of the skull through the vertex without intravenous contrast. RADIATION DOSE REDUCTION: This exam was performed according to the departmental dose-optimization program which includes automated exposure control, adjustment of the mA and/or kV according to patient size and/or use of iterative reconstruction technique. COMPARISON:  CT head 10/07/2022. FINDINGS: Brain: Progressive edema associated with a large right MCA territory infarct. Resulting increased mass effect with 6 mm of leftward midline shift and effacement the right lateral ventricle. Petechial hemorrhage. Increased dilation of the left temporal horn, suspicious for ventricular entrapment. No evidence of mass lesion. Vascular: None hyperdense right MCA vessels, possibly thrombosed. Skull: No acute fracture. Sinuses/Orbits: Paranasal sinus mucosal thickening. No acute orbital findings. Other: No  mastoid effusions. IMPRESSION: 1. Progressive edema associated with a large right MCA territory infarct. Resulting increased mass effect with 6 mm of leftward midline shift. Petechial hemorrhage. 2. Increased dilation of the left temporal horn, suspicious for ventricular entrapment. These results will be called to the ordering clinician or representative by the Radiologist Assistant, and communication documented in the PACS or [employer]. Electronically Signed   By: Margaretha Sheffield M.D.   On: 10/12/2022 14:26    PHYSICAL EXAM  Physical Exam  Constitutional: Appears well-developed and well-nourished.  Middle-aged African-American lady who is intubated and sedated Cardiovascular: Normal rate and regular rhythm.  Respiratory: Mechanically ventilated  Neuro: Mental Status: Intubated,  sedated, opens eyes to noxious stimuli.  Not following any commands  Cranial Nerves: II: Pupils right 4 mm and sluggish left 3 mm and briskly reactive III,IV, VI: Eyes are midline X: Cough and gag intact XI: Head is turned to the right Motor: Tone is normal. Bulk is normal. Spontaneous movement noted in right upper extremity Movement with painful stimuli in right lower extremity and left upper extremity Flicker of movement noted in left lower extremity Sensory: Localizes to pain in bilateral upper extremities and right lower extremity, no reaction to painful stimuli in left lower extremity    ASSESSMENT/PLAN Ms. Taylor Vazquez is a 56 y.o. female with history of CHF, hypertension, obesity, sickle cell trait, prior stroke, cocaine use, presenting for evaluation of sudden onset of left-sided weakness with weakness last known well at 6 PM.  She was given TNK and emergently taken for a mechanical thrombectomy of her M2 MCA posterior division branch.  They achieved temporary recanalization with subsequent reocclusion.  Flat panel CT showed intense contrast blushing and edema of the right frontal and anterior temporal cortex and basal ganglia, suggesting ongoing ischemia . A small right Sylvian hyperdensity is also seen, suggesting contrast extravasation with likely associated SAH.  She returned to ICU intubated.  MRI showed right MCA large infarct.  MRA showed right M2 persistent occlusion.   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 ASPECTS is 8.  CTA head & neck Right MCA M2 segment occlusion with poor collateralization. S/p IR with temporary recanalization with subsequent reocclusion (TICI 2A)  CT Head- Right perisylvian SAH/contrast has diffused but not clearly increased. Continue contrast blush extensively within right anterior frontal and temporal cortex and in the right basal  ganglia ischemia, ACA and MCA territory is involved. Additional cytotoxic edema in the lateral and high right frontal cortex. MRI Brain- Large area of early subacute ischemia throughout the posterior right MCA territory. Mass effect on the right lateral ventricle with trace leftward midline shift. MRA Brain-  No flow related enhancement seen in the right MCA M2 branches aside from the most anterior part of the superior division, unchanged from yesterday's CTA. 2D Echo EF 60-65%  LDL 174 HgbA1c 5.5 UDS cocaine positive VTE prophylaxis - heparin subq No antithrombotic prior to admission, now on ASA 325 Therapy recommendations: Pending Disposition: Pending  Cerebral edema with brain compression Hypertonic saline initiated at 14m/hr- Now off  D5 584mhr ordered by CCM  Sodium Q6 hr  Na 152 PICC line ordered  Hypertension Home meds: Amlodipine Stable IV Cleviprex infusing BP goal 130-150  Hyperlipidemia Home meds: Atorvastatin 20 mg LDL 174, goal < 70 Put on crestor 40 Continue statin at discharge  Acute Hypoxic respiratory failure  CCM  on board Remains intubated post procedure Wean to extubate per CCM team  Cocaine abuse Hx of cocaine use UDS positive for cocaine this time Cessation education will be provided.   Tobacco abuse Current smoker Smoking cessation counseling will be provided  Alcohol abuse Per daughter, she is drinking frequently Not compliant with medication with drinking CIWA protocol   Other Stroke Risk Factors Obesity, Body mass index is 40.34 kg/m., BMI >/= 30 associated with increased stroke risk, recommend weight loss, diet and exercise as appropriate  Hx of stroke Congestive heart failure  Other Active Problems Depression Sertraline 50 mg Hypokalemia Repleted, K now 3.9 Leukocytosis  WBC 11.8 -> 15 -> 14.6 -> 11.7 AKI (baseline Cr 1.5) Cr 2.79-> 3.21 -> 3.60 -> 3.80 -> 3.77  Hospital day # 8   Patient's neurological status remains  unchanged and prognosis seems quite poor.  She continues to have renal failure with creatinine 3.66 today and respiratory failure requiring mechanical ventilation.  Neurological exam shows slight worsening and CT scan shows increasing cytotoxic edema.  Family has met with palliative care team and agreed to DNR but want to pursue ongoing medical care for now hence will restart hypertonic saline with serum sodium goal 150-155 appreciate critical care team and palliative care team health.  No family at the bedside during rounds for discussion  This patient is critically ill and at significant risk of neurological worsening, death and care requires constant monitoring of vital signs, hemodynamics,respiratory and cardiac monitoring, extensive review of multiple databases, frequent neurological assessment, discussion with family, other specialists and medical decision making of high complexity.I have made any additions or clarifications directly to the above note.This critical care time does not reflect procedure time, or teaching time or supervisory time of PA/NP/Med Resident etc but could involve care discussion time.  I spent 30 minutes of neurocritical care time  in the care of  this patient.        Antony Contras, MD Medical Director [facility] Pager: [phone] 10/12/2022 5:21 PM  To contact Stroke Continuity provider, please refer to [email or web address] After hours, contact General Neurology

## 2022-10-12 NOTE — Progress Notes (Signed)
Pt transported on vent to CT and back to 7V85 without complications. RT will monitor.

## 2022-10-13 ENCOUNTER — Inpatient Hospital Stay (HOSPITAL_COMMUNITY): Payer: Medicaid Other

## 2022-10-13 DIAGNOSIS — J9622 Acute and chronic respiratory failure with hypercapnia: Secondary | ICD-10-CM

## 2022-10-13 DIAGNOSIS — N179 Acute kidney failure, unspecified: Secondary | ICD-10-CM

## 2022-10-13 LAB — URINALYSIS, COMPLETE (UACMP) WITH MICROSCOPIC
Bilirubin Urine: NEGATIVE
Glucose, UA: NEGATIVE mg/dL
Ketones, ur: NEGATIVE mg/dL
Nitrite: NEGATIVE
Protein, ur: 30 mg/dL — AB
Specific Gravity, Urine: 1.013 (ref 1.005–1.030)
pH: 5 (ref 5.0–8.0)

## 2022-10-13 LAB — SODIUM
Sodium: 156 mmol/L — ABNORMAL HIGH (ref 135–145)
Sodium: 157 mmol/L — ABNORMAL HIGH (ref 135–145)
Sodium: 156 mmol/L — ABNORMAL HIGH (ref 135–145)
Sodium: 156 mmol/L — ABNORMAL HIGH (ref 135–145)

## 2022-10-13 LAB — CBC
HCT: 32.9 % — ABNORMAL LOW (ref 36.0–46.0)
Hemoglobin: 10.7 g/dL — ABNORMAL LOW (ref 12.0–15.0)
MCH: 30.1 pg (ref 26.0–34.0)
MCHC: 32.5 g/dL (ref 30.0–36.0)
MCV: 92.7 fL (ref 80.0–100.0)
Platelets: 147 10*3/uL — ABNORMAL LOW (ref 150–400)
RBC: 3.55 MIL/uL — ABNORMAL LOW (ref 3.87–5.11)
RDW: 15.2 % (ref 11.5–15.5)
WBC: 12.7 10*3/uL — ABNORMAL HIGH (ref 4.0–10.5)
nRBC: 0 % (ref 0.0–0.2)

## 2022-10-13 LAB — BASIC METABOLIC PANEL
BUN: 101 mg/dL — ABNORMAL HIGH (ref 6–20)
CO2: 17 mmol/L — ABNORMAL LOW (ref 22–32)
Calcium: 8 mg/dL — ABNORMAL LOW (ref 8.9–10.3)
Chloride: >130 mmol/L (ref 98–111)
Creatinine, Ser: 3.8 mg/dL — ABNORMAL HIGH (ref 0.44–1.00)
GFR, Estimated: 13 mL/min — ABNORMAL LOW (ref >60)
Glucose, Bld: 123 mg/dL — ABNORMAL HIGH (ref 70–99)
Potassium: 4.2 mmol/L (ref 3.5–5.1)
Sodium: 156 mmol/L — ABNORMAL HIGH (ref 135–145)

## 2022-10-13 LAB — GLUCOSE, CAPILLARY
Glucose-Capillary: 99 mg/dL (ref 70–99)
Glucose-Capillary: 135 mg/dL — ABNORMAL HIGH (ref 70–99)
Glucose-Capillary: 137 mg/dL — ABNORMAL HIGH (ref 70–99)
Glucose-Capillary: 114 mg/dL — ABNORMAL HIGH (ref 70–99)
Glucose-Capillary: 143 mg/dL — ABNORMAL HIGH (ref 70–99)
Glucose-Capillary: 151 mg/dL — ABNORMAL HIGH (ref 70–99)

## 2022-10-13 MED ORDER — FREE WATER
200.0000 mL | Freq: Three times a day (TID) | Status: DC
  Administered 2022-10-13 – 2022-10-14 (×4): 200 mL

## 2022-10-13 MED ORDER — FREE WATER: 200.0000 mL | Status: DC

## 2022-10-13 MED ORDER — FREE WATER: 100.0000 mL | Freq: Three times a day (TID) | Status: DC

## 2022-10-13 NOTE — Progress Notes (Addendum)
eLink Physician-Brief Progress Note Patient Name: SAPHRONIA OZDEMIR DOB: 05-09-1966 MRN: [id number]   Date of Service  10/13/2022  HPI/Events of Note  Hypernatremia - Na+ = 156.  eICU Interventions  Plan: Free water 100 mL per tube now and Q 8 hours per request of Neurology.  Please call Neurology for further management of Na+. Continue to trend Na+.     Intervention Category Major Interventions: Electrolyte abnormality - evaluation and management  Lysle Dingwall 10/13/2022, 7:46 PM

## 2022-10-13 NOTE — Progress Notes (Signed)
NAME:  MARCEE JACOBS, MRN:  [id number], DOB:  [birth year], LOS: 9 ADMISSION DATE:  10/14/2022, CONSULTATION DATE:  10/13/22 REFERRING MD: Rory Percy  , CHIEF COMPLAINT:  Code stroke   History of Present Illness:  Maribella Kuna is a 56 y.o. F with PMH significant for asthma, depression, ETOH use, HTN, Sickle cell trait , HFpEF who presented to the ED after being found by her daughter with flaccid L side and R gaze deviation.  Her LKW was 1830 12/14 found at 1925.  She had a normal glucose and was brought to the ED as a code stroke.  She was initially hypertensive 200/100's.  The decision was made to administer TNK.  Imaging showed R MCA M2 occlusion and so interventional radiology consulted.  Pt was combative in the ED requiring ativan.  Labs were significant for K 3.2, Creatinine 1.6.    Mechanical thrombectomy was performed with TICI 2A with temporary recanalization, but then subsequent re-occlusion.  Repeat imaging with contrast extravasation and likely SAH.  She was left intubated and PCCM consulted.   Pertinent  Medical History   has a past medical history of Asthma, CHF (congestive heart failure) ([place]), Depression, Hypertension, Obesity, Sickle cell trait ([place]), and Stroke ([place]).   Significant Hospital Events: Including procedures, antibiotic start and stop dates in addition to other pertinent events   12/14 presented as a code stroke, R MCA M2 occlusion, taken to IR, possible SAH post-procedure and left intubated 12/15 MRI completed  12/18 CPAP PS 5/5 and 40%,  Interim History / Subjective:   CT Head showed increase in cerebral edema and hypertonic saline was started again No acute events overnight Repeat renal US shows resolution of hydronephrosis Daughter updated at bedside  Objective   Blood pressure 102/64, pulse 60, temperature 99.4 F (37.4 C), temperature source Oral, resp. rate 16, height '5\' 3"'$  (1.6 m), weight 103.3 kg, SpO2 100 %.    Vent Mode: PRVC FiO2 (%):  [40 %]  40 % Set Rate:  [18 bmp] 18 bmp Vt Set:  [420 mL] 420 mL PEEP:  [5 cmH20] 5 cmH20 Pressure Support:  [5 cmH20] 5 cmH20 Plateau Pressure:  [15 cmH20-22 cmH20] 19 cmH20   Intake/Output Summary (Last 24 hours) at 10/13/2022 0739 Last data filed at 10/13/2022 0700 Gross per 24 hour  Intake 2601.9 ml  Output 3198 ml  Net -596.1 ml   Filed Weights   10/01/2022 1900 10/06/22 0500 10/07/22 0500  Weight: 102.6 kg 90.4 kg 103.3 kg    General appearance: 56 y.o., female, intubated Eyes: PERRL Lungs: mech breath sounds bl CV: RRR, no murmur  Abdomen: Soft, non-tender; non-distended, BS present  Extremities: no edema, warm Skin: Normal turgor and texture; no rash Neuro: does not follow commands  Resolved Hospital Problem list     Assessment & Plan:   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  Complicated by Post-procedure Perisylvian SAH W/ right anterior frontal and temporal cortex, right basal ganglia ischemia Cytotoxic cerebral edema  S/p thrombectomy with TICI 2a recanalization and stent retriever and aspiration but with subsequent re-occlusion and concern for SAH, left intubated P: - HTS infusion restarted 12/22 and stopped overnight - crestor for HLD as below - Neuroprotective measures: HOB > 30 degrees, normoglycemia, normothermia, eucapnia, correct electrolytes  Acute hypoxemic respiratory failure  Possible aspiration pneurmonia/pneumonitis - Sputum culture GS with abundant WBC, abundant Gram negative rods, moderate gram + cocci in pairs, culture pending  - Unasyn completed 12/21 - pulmonary hygiene - PAD  guidelines sedation's with fentanyl plus propofol - PS trials as tolerated but mental status precludes extubation without clear plan for reintubation/trach vs DNI - advance ET tube 3cm based on AM cxr  AKI on CKD Bladder distenstion Right Hydronephrosis - limit nephrotoxic medications - continue foley catheter - hydronephrosis resolved on repeat imaging 12/22 -  Continue to trend Cr  HTN Chronic diastolic heart failure - Holding home BP meds  Type 2 DM -Holding metformin, continue SSI with CBGs  Hypokalemia / Hypernatremia - Replete/ treat  as needed - Trend BMET - Maintain Mag > 2  Tube feeds - Continue tube feeds - CBG and SSI  Goals of Care Pt. Has had a devastating stroke, will continue to monitor for neurological improvement, palliative care following . Patient made DNR.   Best Practice (right click and "Reselect all SmartList Selections" daily)   Diet/type: tubefeeds DVT prophylaxis: prophylactic heparin  GI prophylaxis: PPI Lines: N/A Foley:  N/A Code Status:  DNR Last date of multidisciplinary goals of care discussion [per primary stroke team.   This patient is critically ill with multiple organ system failure; which, requires frequent high complexity decision making, assessment, support, evaluation, and titration of therapies. This was completed through the application of advanced monitoring technologies and extensive interpretation of multiple databases. During this encounter critical care time was devoted to patient care services described in this note for 35 minutes.  Freda Jackson, MD [facility] Office: [phone]   See Amion for personal pager PCCM on call pager [phone] until 7pm. Please call Elink 7p-7a. [phone]

## 2022-10-13 NOTE — Progress Notes (Addendum)
CCM placed order for free water flushes 200 mL q 4hr.  This RN paged Dr. Leonel Ramsay with neuro to verify this order with him.  He said he wants 126m q 8 hr to avoid dropping pt's sodium level too quickly.  This RN called elink to notify CCM MD of this conversation.   Later, Dr. XErlinda Hongmodified the 105mq 8hr order to be 200 mL q 8hr.  Messaged Dr. KiLeonel Ramsayo see if this was okay.  Dr. KiLeonel Ramsayaid the new order placed by Dr. XuErlinda Hongas fine.

## 2022-10-13 NOTE — Progress Notes (Signed)
STROKE TEAM PROGRESS NOTE   INTERVAL HISTORY No family at the bedside.  Patient needed Precedex and fentanyl for agitation with ventilation.  She was very lethargic, not following commands, not moving extremities even with pain stimulation.  Vitals:   10/13/22 1000 10/13/22 1018 10/13/22 1100 10/13/22 1200  BP: 104/63 120/68 114/61 115/62  Pulse: 65  65 67  Resp: 15  (!) 32 (!) 22  Temp: 98.6 F (37 C)     TempSrc: Axillary     SpO2: 98%  97% 97%  Weight:      Height:       CBC:  Recent Labs  Lab 10/09/22 0619 10/11/22 0905 10/13/22 0610  WBC 14.6* 11.7* 12.7*  NEUTROABS 10.8* 7.7  --   HGB 13.1 11.2* 10.7*  HCT 39.9 35.1* 32.9*  MCV 92.4 93.9 92.7  PLT 176 151 161*   Basic Metabolic Panel:  Recent Labs  Lab 10/06/22 1543 10/06/22 2200 10/11/22 0905 10/12/22 0734 10/12/22 1045 10/12/22 1802 10/13/22 0055 10/13/22 0610  NA 148*   < > 152* 143 146*   < > 156* 156*  157*  K  --    < > 3.9 3.5 3.4*  --   --  4.2  CL  --    < > 128* 116* 122*  --   --  >130*  CO2  --    < > 19* 17* 17*  --   --  17*  GLUCOSE  --    < > 165* 350* 171*  --   --  123*  BUN  --    < > 82* 85* 90*  --   --  101*  CREATININE  --    < > 3.77* 3.49* 3.66*  --   --  3.80*  CALCIUM  --    < > 7.7* 7.0* 7.3*  --   --  8.0*  MG 2.5*   < > 2.5* 2.3  --   --   --   --   PHOS 3.2  --   --   --   --   --   --   --    < > = values in this interval not displayed.   Lipid Panel:  Recent Labs  Lab 10/09/22 0619  TRIG 311*    IMAGING past 24 hours DG CHEST PORT 1 VIEW  Result Date: 10/13/2022 CLINICAL DATA:  Leukocytosis. EXAM: PORTABLE CHEST 1 VIEW COMPARISON:  10/09/2022 and older exams.  CT, 12/11/2018. FINDINGS: Cardiac silhouette mildly enlarged.  No mediastinal or hilar masses. Bilateral interstitial thickening. Mild hazy intervening airspace opacities. Additional lung base opacities consistent with atelectasis. Possible small effusions. No pneumothorax. Endotracheal tube tip currently  projects 6.5 cm above the carina. Nasal/orogastric tube passes well below the diaphragm and below the included field of view. Right PICC tip projects in the mid superior vena cava. IMPRESSION: 1. No change compared to the most recent prior exam. 2. Mild cardiomegaly and bilateral interstitial thickening with subtle intervening ground-glass opacities suspected to be pulmonary edema. 3. Endotracheal tube tip currently projects 6.5 cm above the carina. Electronically Signed   By: Lajean Manes M.D.   On: 10/13/2022 11:12   US RENAL  Result Date: 10/12/2022 CLINICAL DATA:  Follow-up right-sided hydronephrosis EXAM: RENAL / URINARY TRACT ULTRASOUND COMPLETE COMPARISON:  10/09/2022 FINDINGS: Right Kidney: Renal measurements: 11.5 x 5.2 x 6.1 cm. = volume: 190 mL. Previously seen hydronephrosis has resolved in the interval. Left Kidney: Renal measurements:  10.1 x 6.0 x 5.8 cm. = volume: 182 mL. Echogenicity within normal limits. No mass or hydronephrosis visualized. Bladder: Decompressed and not well visualized. Other: None. IMPRESSION: Resolution of previously seen right-sided hydronephrosis. This may have been related to a small distal stone which has passed. No other focal abnormality is noted. Electronically Signed   By: Inez Catalina M.D.   On: 10/12/2022 18:16   CT HEAD WO CONTRAST (5MM)  Result Date: 10/12/2022 CLINICAL DATA:  Neuro deficit, acute, stroke suspected EXAM: CT HEAD WITHOUT CONTRAST TECHNIQUE: Contiguous axial images were obtained from the base of the skull through the vertex without intravenous contrast. RADIATION DOSE REDUCTION: This exam was performed according to the departmental dose-optimization program which includes automated exposure control, adjustment of the mA and/or kV according to patient size and/or use of iterative reconstruction technique. COMPARISON:  CT head 10/07/2022. FINDINGS: Brain: Progressive edema associated with a large right MCA territory infarct. Resulting increased  mass effect with 6 mm of leftward midline shift and effacement the right lateral ventricle. Petechial hemorrhage. Increased dilation of the left temporal horn, suspicious for ventricular entrapment. No evidence of mass lesion. Vascular: None hyperdense right MCA vessels, possibly thrombosed. Skull: No acute fracture. Sinuses/Orbits: Paranasal sinus mucosal thickening. No acute orbital findings. Other: No mastoid effusions. IMPRESSION: 1. Progressive edema associated with a large right MCA territory infarct. Resulting increased mass effect with 6 mm of leftward midline shift. Petechial hemorrhage. 2. Increased dilation of the left temporal horn, suspicious for ventricular entrapment. These results will be called to the ordering clinician or representative by the Radiologist Assistant, and communication documented in the PACS or [employer]. Electronically Signed   By: Margaretha Sheffield M.D.   On: 10/12/2022 14:26    PHYSICAL EXAM  Physical Exam   Temp:  [98.5 F (36.9 C)-99.4 F (37.4 C)] 98.6 F (37 C) (12/23 1000) Pulse Rate:  [57-88] 67 (12/23 1200) Resp:  [14-32] 22 (12/23 1200) BP: (88-141)/(54-95) 115/62 (12/23 1200) SpO2:  [95 %-100 %] 97 % (12/23 1200) FiO2 (%):  [30 %-40 %] 30 % (12/23 0729)  General - Well nourished, well developed, intubated on precedex and fentanyl.  Ophthalmologic - fundi not visualized due to noncooperation.  Cardiovascular - Regular rate and rhythm.  Neuro - intubated on precedex and fentanyl, eyes half way open on voice, left eye palpebral fissure larger than right, not following commands. Eyes in mid position, not blinking to visual threat, doll's eyes sluggish, not tracking, PERRL. Corneal reflex present, gag and cough present. Breathing over the vent.  Facial symmetry not able to test due to ET tube.  Tongue protrusion not cooperative. On pain stimulation, no movement in all extremities. Sensation, coordination and gait not  tested.   ASSESSMENT/PLAN Ms. HOLLYN STUCKY is a 56 y.o. female with history of CHF, hypertension, obesity, sickle cell trait, prior stroke, cocaine use, presenting for evaluation of sudden onset of left-sided weakness with weakness last known well at 6 PM.  She was given TNK and emergently taken for a mechanical thrombectomy of her M2 MCA posterior division branch.  They achieved temporary recanalization with subsequent reocclusion.  Flat panel CT showed intense contrast blushing and edema of the right frontal and anterior temporal cortex and basal ganglia, suggesting ongoing ischemia . A small right Sylvian hyperdensity is also seen, suggesting contrast extravasation with likely associated SAH.  She returned to ICU intubated.  MRI showed right MCA large infarct.  MRA showed right M2 persistent occlusion.   Stroke: Right  MCA infarct due to right M2 occlusion s/p TNK and unsuccessful IR with TICI2a and small SAH Code Stroke CT head No intracranial hemorrhage. ASPECTS is 8.  CTA head & neck Right MCA M2 segment occlusion with poor collateralization. S/p IR with temporary recanalization with subsequent reocclusion (TICI 2A)  CT Head- Right perisylvian SAH/contrast has diffused but not clearly increased. Continue contrast blush extensively within right anterior frontal and temporal cortex and in the right basal ganglia ischemia, ACA and MCA territory is involved. Additional cytotoxic edema in the lateral and high right frontal cortex. MRI Brain- Large area of early subacute ischemia throughout the posterior right MCA territory. Mass effect on the right lateral ventricle with trace leftward midline shift. MRA Brain- No flow related enhancement seen in the right MCA M2 branches aside from the most anterior part of the superior division, unchanged from yesterday's CTA. 2D Echo EF 60-65%  LDL 174 HgbA1c 5.5 UDS cocaine positive VTE prophylaxis - heparin subq No antithrombotic prior to admission, now  on ASA 325 Therapy recommendations: Pending Disposition: Pending  Cerebral edema Hyponatremia Hypertonic saline initiated at 48m/hr- Now off  D5 564mhr ordered by CCM -> off Sodium Q6 hr  Na 152->156 On FW 200 Q8 Allow Na gradually trending down CT head 12/22 Progressive edema associated with a large right MCA territory infarct. Resulting increased mass effect with 6 mm of leftward midline shift. Petechial hemorrhage. Increased dilation of the left temporal horn, suspicious for ventricular entrapment. PICC line in place  Hypertension Home meds: Amlodipine Stable On amlodipine 10 Off Cleviprex  BP goal < 160  Hyperlipidemia Home meds: Atorvastatin 20 mg LDL 174, goal < 70 Put on crestor 40 Continue statin at discharge  Acute Hypoxic respiratory failure  CCM on board Remains intubated post procedure Wean to extubate per CCM team  Cocaine abuse Hx of cocaine use UDS positive for cocaine this time Cessation education will be provided.   Tobacco abuse Current smoker Smoking cessation counseling will be provided  Alcohol abuse Per daughter, she is drinking frequently Not compliant with medication with drinking CIWA protocol   AKI  baseline Cr 1.5 Cr 2.79-> 3.49 -> 3.66 -> 3.80 on TF and FW  Fever and leukocytosis Tmax 101.5-> afebrile WBC 11.8 -> 15 -> 14.6 -> 11.7->12.7 UA WBC 6-10 CXR 12/23 mild cardiomegaly and bilateral interstitial thickening with subtle intervening groundglass opacities suspected to be pulmonary edema  Other Stroke Risk Factors Obesity, Body mass index is 40.34 kg/m., BMI >/= 30 associated with increased stroke risk, recommend weight loss, diet and exercise as appropriate  Hx of stroke Congestive heart failure  Other Active Problems Depression Sertraline 50 mg Hypokalemia Repleted, K now 3.9->4.2   Hospital day # 9  This patient is critically ill due to Large right MCA stroke, cerebral edema, alcohol abuse, respiratory  failure and at significant risk of neurological worsening, death form recurrent stroke, hemorrhagic transformation, brain herniation, seizure, renal failure. This patient's care requires constant monitoring of vital signs, hemodynamics, respiratory and cardiac monitoring, review of multiple databases, neurological assessment, discussion with family, other specialists and medical decision making of high complexity. I spent 35 minutes of neurocritical care time in the care of this patient.  JiRosalin HawkingMD PhD Stroke Neurology 10/13/2022 9:56 PM   To contact Stroke Continuity provider, please refer to [email or web address], contact General Neurology

## 2022-10-14 DIAGNOSIS — G936 Cerebral edema: Secondary | ICD-10-CM

## 2022-10-14 LAB — GLUCOSE, CAPILLARY
Glucose-Capillary: 115 mg/dL — ABNORMAL HIGH (ref 70–99)
Glucose-Capillary: 127 mg/dL — ABNORMAL HIGH (ref 70–99)
Glucose-Capillary: 124 mg/dL — ABNORMAL HIGH (ref 70–99)
Glucose-Capillary: 134 mg/dL — ABNORMAL HIGH (ref 70–99)
Glucose-Capillary: 127 mg/dL — ABNORMAL HIGH (ref 70–99)
Glucose-Capillary: 146 mg/dL — ABNORMAL HIGH (ref 70–99)

## 2022-10-14 LAB — BASIC METABOLIC PANEL
BUN: 108 mg/dL — ABNORMAL HIGH (ref 6–20)
CO2: 17 mmol/L — ABNORMAL LOW (ref 22–32)
Calcium: 8.3 mg/dL — ABNORMAL LOW (ref 8.9–10.3)
Chloride: >130 mmol/L (ref 98–111)
Creatinine, Ser: 3.69 mg/dL — ABNORMAL HIGH (ref 0.44–1.00)
GFR, Estimated: 14 mL/min — ABNORMAL LOW (ref >60)
Glucose, Bld: 124 mg/dL — ABNORMAL HIGH (ref 70–99)
Potassium: 4.4 mmol/L (ref 3.5–5.1)
Sodium: 156 mmol/L — ABNORMAL HIGH (ref 135–145)

## 2022-10-14 LAB — SODIUM
Sodium: 156 mmol/L — ABNORMAL HIGH (ref 135–145)
Sodium: 156 mmol/L — ABNORMAL HIGH (ref 135–145)
Sodium: 159 mmol/L — ABNORMAL HIGH (ref 135–145)

## 2022-10-14 LAB — CBC
HCT: 33.7 % — ABNORMAL LOW (ref 36.0–46.0)
Hemoglobin: 10.9 g/dL — ABNORMAL LOW (ref 12.0–15.0)
MCH: 30 pg (ref 26.0–34.0)
MCHC: 32.3 g/dL (ref 30.0–36.0)
MCV: 92.8 fL (ref 80.0–100.0)
Platelets: 149 10*3/uL — ABNORMAL LOW (ref 150–400)
RBC: 3.63 MIL/uL — ABNORMAL LOW (ref 3.87–5.11)
RDW: 14.8 % (ref 11.5–15.5)
WBC: 13.5 10*3/uL — ABNORMAL HIGH (ref 4.0–10.5)
nRBC: 0 % (ref 0.0–0.2)

## 2022-10-14 NOTE — Progress Notes (Addendum)
NAME:  LATOY LABRIOLA, MRN:  [id number], DOB:  [birth year], LOS: 8 ADMISSION DATE:  10/20/2022, CONSULTATION DATE:  10/14/22 REFERRING MD: Rory Percy  , CHIEF COMPLAINT:  Code stroke   History of Present Illness:  Geniene List is a 56 y.o. F with PMH significant for asthma, depression, ETOH use, HTN, Sickle cell trait , HFpEF who presented to the ED after being found by her daughter with flaccid L side and R gaze deviation.  Her LKW was 1830 12/14 found at 1925.  She had a normal glucose and was brought to the ED as a code stroke.  She was initially hypertensive 200/100's.  The decision was made to administer TNK.  Imaging showed R MCA M2 occlusion and so interventional radiology consulted.  Pt was combative in the ED requiring ativan.  Labs were significant for K 3.2, Creatinine 1.6.    Mechanical thrombectomy was performed with TICI 2A with temporary recanalization, but then subsequent re-occlusion.  Repeat imaging with contrast extravasation and likely SAH.  She was left intubated and PCCM consulted.   Pertinent  Medical History   has a past medical history of Asthma, CHF (congestive heart failure) ([place]), Depression, Hypertension, Obesity, Sickle cell trait ([place]), and Stroke ([place]).   Significant Hospital Events: Including procedures, antibiotic start and stop dates in addition to other pertinent events   12/14 presented as a code stroke, R MCA M2 occlusion, taken to IR, possible SAH post-procedure and left intubated 12/15 MRI completed  12/18 CPAP PS 5/5 and 40%,  Interim History / Subjective:   No acute events overnight Remains on fentanyl and precedex ET tube advanced 3CM yesterday based on CXR  Objective   Blood pressure (!) 97/56, pulse (!) 58, temperature 99.5 F (37.5 C), temperature source Axillary, resp. rate 16, height '5\' 3"'$  (1.6 m), weight 103.3 kg, SpO2 100 %.    Vent Mode: PRVC FiO2 (%):  [30 %] 30 % Set Rate:  [18 bmp] 18 bmp Vt Set:  [420 mL] 420 mL PEEP:  [5  cmH20] 5 cmH20 Plateau Pressure:  [21 cmH20] 21 cmH20   Intake/Output Summary (Last 24 hours) at 10/14/2022 0743 Last data filed at 10/14/2022 0700 Gross per 24 hour  Intake 4320.34 ml  Output 2748 ml  Net 1572.34 ml   Filed Weights   10/09/2022 1900 10/06/22 0500 10/07/22 0500  Weight: 102.6 kg 90.4 kg 103.3 kg    General appearance: 56 y.o., female, intubated, sedated Eyes: PERRL Lungs: mech breath sounds bl CV: RRR, no murmur  Abdomen: Soft, non-tender; non-distended, BS present  Extremities: no edema, warm Skin: Normal turgor and texture; no rash Neuro: does not follow commands  Resolved Hospital Problem list     Assessment & Plan:   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  Complicated by Post-procedure Perisylvian SAH W/ right anterior frontal and temporal cortex, right basal ganglia ischemia Cytotoxic cerebral edema  S/p thrombectomy with TICI 2a recanalization and stent retriever and aspiration but with subsequent re-occlusion and concern for SAH, left intubated P: - HTS infusion restarted 12/22 and stopped overnight - crestor for HLD as below - Neuroprotective measures: HOB > 30 degrees, normoglycemia, normothermia, eucapnia, correct electrolytes  Acute hypoxemic respiratory failure  Possible aspiration pneurmonia/pneumonitis - Sputum culture GS with abundant WBC, abundant Gram negative rods, moderate gram + cocci in pairs, culture pending  - Unasyn completed 12/21 - pulmonary hygiene - PAD guidelines sedation's with fentanyl plus precedex - PS trials as tolerated but mental status precludes extubation  without clear plan for reintubation/trach vs DNI - advance ET tube 3cm based on AM cxr  AKI on CKD Bladder distenstion Right Hydronephrosis - limit nephrotoxic medications - continue foley catheter - hydronephrosis resolved on repeat imaging 12/22 - Continue to trend Cr  HTN Chronic diastolic heart failure - Holding home BP meds  Type 2 DM -Holding  metformin, continue SSI with CBGs  Hypokalemia / Hypernatremia - Replete/ treat  as needed - free water started over night 158m q8hr - Trend BMET - Maintain Mag > 2  Tube feeds - Continue tube feeds - CBG and SSI  Goals of Care Pt. Has had a devastating stroke, will continue to monitor for neurological improvement, palliative care following . Patient made DNR.   Best Practice (right click and "Reselect all SmartList Selections" daily)   Diet/type: tubefeeds DVT prophylaxis: prophylactic heparin  GI prophylaxis: PPI Lines: N/A Foley:  N/A Code Status:  DNR Last date of multidisciplinary goals of care discussion [per primary stroke team.   This patient is critically ill with multiple organ system failure; which, requires frequent high complexity decision making, assessment, support, evaluation, and titration of therapies. This was completed through the application of advanced monitoring technologies and extensive interpretation of multiple databases. During this encounter critical care time was devoted to patient care services described in this note for 32 minutes.  JFreda Jackson MD [facility]: [phone]  See Amion for personal pager PCCM on call pager [phone] 7pm. Please call Elink 7p-7a. [phone]

## 2022-10-15 ENCOUNTER — Inpatient Hospital Stay (HOSPITAL_COMMUNITY): Payer: Medicaid Other

## 2022-10-15 DIAGNOSIS — J9611 Chronic respiratory failure with hypoxia: Secondary | ICD-10-CM

## 2022-10-15 LAB — BASIC METABOLIC PANEL
BUN: 112 mg/dL — ABNORMAL HIGH (ref 6–20)
CO2: 18 mmol/L — ABNORMAL LOW (ref 22–32)
Calcium: 8.8 mg/dL — ABNORMAL LOW (ref 8.9–10.3)
Chloride: >130 mmol/L (ref 98–111)
Creatinine, Ser: 3.55 mg/dL — ABNORMAL HIGH (ref 0.44–1.00)
GFR, Estimated: 15 mL/min — ABNORMAL LOW (ref >60)
Glucose, Bld: 116 mg/dL — ABNORMAL HIGH (ref 70–99)
Potassium: 5 mmol/L (ref 3.5–5.1)
Sodium: 160 mmol/L — ABNORMAL HIGH (ref 135–145)

## 2022-10-15 LAB — SODIUM
Sodium: 159 mmol/L — ABNORMAL HIGH (ref 135–145)
Sodium: 159 mmol/L — ABNORMAL HIGH (ref 135–145)
Sodium: 161 mmol/L (ref 135–145)

## 2022-10-15 LAB — GLUCOSE, CAPILLARY
Glucose-Capillary: 105 mg/dL — ABNORMAL HIGH (ref 70–99)
Glucose-Capillary: 109 mg/dL — ABNORMAL HIGH (ref 70–99)
Glucose-Capillary: 111 mg/dL — ABNORMAL HIGH (ref 70–99)
Glucose-Capillary: 112 mg/dL — ABNORMAL HIGH (ref 70–99)
Glucose-Capillary: 114 mg/dL — ABNORMAL HIGH (ref 70–99)
Glucose-Capillary: 117 mg/dL — ABNORMAL HIGH (ref 70–99)

## 2022-10-15 LAB — CBC
HCT: 35.3 % — ABNORMAL LOW (ref 36.0–46.0)
Hemoglobin: 10.9 g/dL — ABNORMAL LOW (ref 12.0–15.0)
MCH: 29.3 pg (ref 26.0–34.0)
MCHC: 30.9 g/dL (ref 30.0–36.0)
MCV: 94.9 fL (ref 80.0–100.0)
Platelets: 170 10*3/uL (ref 150–400)
RBC: 3.72 MIL/uL — ABNORMAL LOW (ref 3.87–5.11)
RDW: 15.1 % (ref 11.5–15.5)
WBC: 14.2 10*3/uL — ABNORMAL HIGH (ref 4.0–10.5)
nRBC: 0 % (ref 0.0–0.2)

## 2022-10-15 MED ORDER — PHENYLEPHRINE HCL-NACL 20-0.9 MG/250ML-% IV SOLN
0.0000 ug/min | INTRAVENOUS | Status: DC
  Administered 2022-10-15: 20 ug/min via INTRAVENOUS
  Filled 2022-10-15: qty 250

## 2022-10-15 MED ORDER — FREE WATER
200.0000 mL | Freq: Four times a day (QID) | Status: DC
  Administered 2022-10-15: 200 mL

## 2022-10-15 MED ORDER — PANCRELIPASE (LIP-PROT-AMYL) 10440-39150 UNITS PO TABS
20880.0000 [IU] | ORAL_TABLET | Freq: Once | ORAL | Status: AC
  Administered 2022-10-15: 20880 [IU]
  Filled 2022-10-15: qty 2

## 2022-10-15 MED ORDER — FREE WATER
200.0000 mL | Status: DC
  Administered 2022-10-15 – 2022-10-16 (×5): 200 mL

## 2022-10-15 MED ORDER — SODIUM BICARBONATE 650 MG PO TABS
650.0000 mg | ORAL_TABLET | Freq: Once | ORAL | Status: AC
  Administered 2022-10-15: 650 mg
  Filled 2022-10-15: qty 1

## 2022-10-15 NOTE — Progress Notes (Signed)
STROKE TEAM PROGRESS NOTE   INTERVAL HISTORY RN is at the bedside.  Patient still intubated, on fentanyl and precedex. Neuro unchanged, still not following commands, not tracking not blinking to visual threat. Right pupil 66m, left 1.564m and right mild ptosis, CT repeat showed slight decreased MLS. Na 160   Vitals:   10/15/22 0743 10/15/22 0800 10/15/22 0900 10/15/22 1146  BP: (!) 104/54 (!) 107/52 (!) 104/59   Pulse: 62 73 70   Resp: (!) '21 17 16   '$ Temp:  99.2 F (37.3 C)  98.3 F (36.8 C)  TempSrc:  Axillary  Axillary  SpO2: 99% 97% 97%   Weight:      Height:       CBC:  Recent Labs  Lab 10/09/22 0619 10/11/22 0905 10/13/22 0610 10/14/22 0626 10/15/22 0617  WBC 14.6* 11.7*   < > 13.5* 14.2*  NEUTROABS 10.8* 7.7  --   --   --   HGB 13.1 11.2*   < > 10.9* 10.9*  HCT 39.9 35.1*   < > 33.7* 35.3*  MCV 92.4 93.9   < > 92.8 94.9  PLT 176 151   < > 149* 170   < > = values in this interval not displayed.   Basic Metabolic Panel:  Recent Labs  Lab 10/11/22 0905 10/12/22 0734 10/12/22 1045 10/14/22 0626 10/14/22 1219 10/14/22 2359 10/15/22 0617  NA 152* 143   < > 156*   < > 159* 160*  K 3.9 3.5   < > 4.4  --   --  5.0  CL 128* 116*   < > >130*  --   --  >130*  CO2 19* 17*   < > 17*  --   --  18*  GLUCOSE 165* 350*   < > 124*  --   --  116*  BUN 82* 85*   < > 108*  --   --  112*  CREATININE 3.77* 3.49*   < > 3.69*  --   --  3.55*  CALCIUM 7.7* 7.0*   < > 8.3*  --   --  8.8*  MG 2.5* 2.3  --   --   --   --   --    < > = values in this interval not displayed.   Lipid Panel:  Recent Labs  Lab 10/09/22 0619  TRIG 311*    IMAGING past 24 hours No results found.  PHYSICAL EXAM  Physical Exam   Temp:  [98.3 F (36.8 C)-99.5 F (37.5 C)] 98.3 F (36.8 C) (12/25 1146) Pulse Rate:  [59-94] 70 (12/25 0900) Resp:  [14-21] 16 (12/25 0900) BP: (91-122)/(52-66) 104/59 (12/25 0900) SpO2:  [94 %-100 %] 97 % (12/25 0900) FiO2 (%):  [30 %] 30 % (12/25  0743)  General - Well nourished, well developed, intubated on precedex and fentanyl.  Ophthalmologic - fundi not visualized due to noncooperation.  Cardiovascular - Regular rate and rhythm.  Neuro - intubated on precedex and fentanyl, eyes open spontaneously, left eye palpebral fissure larger than right, not following commands. Eyes in mid position, not blinking to visual threat, doll's eyes sluggish, not tracking, right pupil 66m49mleft pupil 1.5mm67mluggish to light b/l. Corneal reflex present, gag and cough present. Breathing over the vent.  Facial symmetry not able to test due to ET tube.  Tongue protrusion not cooperative. On pain stimulation, no movement in all extremities. Sensation, coordination and gait not tested.   ASSESSMENT/PLAN Taylor Vazquez  is a 56 y.o. female with history of CHF, hypertension, obesity, sickle cell trait, prior stroke, cocaine use, presenting for evaluation of sudden onset of left-sided weakness with weakness last known well at 6 PM.  She was given TNK and emergently taken for a mechanical thrombectomy of her M2 MCA posterior division branch.  They achieved temporary recanalization with subsequent reocclusion.  Flat panel CT showed intense contrast blushing and edema of the right frontal and anterior temporal cortex and basal ganglia, suggesting ongoing ischemia . A small right Sylvian hyperdensity is also seen, suggesting contrast extravasation with likely associated SAH.  She returned to ICU intubated.  MRI showed right MCA large infarct.  MRA showed right M2 persistent occlusion.   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 ASPECTS is 8.  CTA head & neck Right MCA M2 segment occlusion with poor collateralization. S/p IR with temporary recanalization with subsequent reocclusion (TICI 2A)  CT Head- Right perisylvian SAH/contrast has diffused but not  clearly increased. Continue contrast blush extensively within right anterior frontal and temporal cortex and in the right basal ganglia ischemia, ACA and MCA territory is involved. Additional cytotoxic edema in the lateral and high right frontal cortex. MRI Brain- Large area of early subacute ischemia throughout the posterior right MCA territory. Mass effect on the right lateral ventricle with trace leftward midline shift. MRA Brain- No flow related enhancement seen in the right MCA M2 branches aside from the most anterior part of the superior division, unchanged from yesterday's CTA. 2D Echo EF 60-65%  LDL 174 HgbA1c 5.5 UDS cocaine positive VTE prophylaxis - heparin subq No antithrombotic prior to admission, now on ASA 325.  Therapy recommendations: Pending Disposition: had long discussion with daughter for [place], she requested comfort care after Christmas.   Cerebral edema Hyponatremia Hypertonic saline initiated at 82m/hr- Now off  Sodium Q6 hr  Na 152->156->159->160 On FW 200 Q8->Q6->Q4h Allow Na gradually trending down CT head 12/22 Progressive edema associated with a large right MCA territory infarct. Resulting increased mass effect with 6 mm of leftward midline shift. Petechial hemorrhage. Increased dilation of the left temporal horn, suspicious for ventricular entrapment. CT head repeat 12/25 showed slightly decreased MLS PICC line in place  Hypertension Home meds: Amlodipine Stable on the low end On amlodipine 10 Off Cleviprex  BP goal < 160  Hyperlipidemia Home meds: Atorvastatin 20 mg LDL 174, goal < 70 On crestor 40 Continue statin at discharge  Acute Hypoxic respiratory failure  CCM on board Remains intubated post procedure Not candidate for extubation Will need trach if aggressive management  Cocaine abuse Hx of cocaine use UDS positive for cocaine this time Cessation education will be provided.   Tobacco abuse Current smoker Smoking cessation counseling  will be provided  Alcohol abuse Per daughter, she is drinking frequently Not compliant with medication with drinking CIWA protocol   AKI  baseline Cr 1.5 Cr 2.79-> 3.49 -> 3.66 -> 3.80->3.69->3.55 on TF and FW  Fever and leukocytosis Tmax 101.5-> afebrile WBC 11.8 -> 15 -> 14.6 -> 11.7->12.7->13.5->14.2 UA WBC 6-10 CXR 12/23 mild cardiomegaly and bilateral interstitial thickening with subtle intervening groundglass opacities suspected to be pulmonary edema  Other Stroke Risk Factors Obesity, Body mass index is 40.34 kg/m., BMI >/= 30 associated with increased stroke risk, recommend weight loss, diet and exercise as appropriate  Hx of stroke Congestive heart failure  Other Active  Problems Depression Sertraline 50 mg Hypokalemia Repleted, K now 3.9->4.2->4.4->5.0   Hospital day # 11  This patient is critically ill due to Large right MCA stroke, cerebral edema, alcohol abuse, respiratory failure and at significant risk of neurological worsening, death form recurrent stroke, hemorrhagic transformation, brain herniation, seizure, renal failure. This patient's care requires constant monitoring of vital signs, hemodynamics, respiratory and cardiac monitoring, review of multiple databases, neurological assessment, discussion with family, other specialists and medical decision making of high complexity. I spent 35 minutes of neurocritical care time in the care of this patient.     Rosalin Hawking, MD PhD Stroke Neurology 10/15/2022 1:52 PM   To contact Stroke Continuity provider, please refer to [email or web address] After hours, contact General Neurology

## 2022-10-15 NOTE — Progress Notes (Signed)
NAME:  Taylor Vazquez, MRN:  [id number], DOB:  [birth year], LOS: 11 ADMISSION DATE:  10/15/2022, CONSULTATION DATE:  10/15/22 REFERRING MD: Rory Percy  , CHIEF COMPLAINT:  Code stroke   History of Present Illness:  Taylor Vazquez is a 56 y.o. F with PMH significant for asthma, depression, ETOH use, HTN, Sickle cell trait , HFpEF who presented to the ED after being found by her daughter with flaccid L side and R gaze deviation.  Her LKW was 1830 12/14 found at 1925.  She had a normal glucose and was brought to the ED as a code stroke.  She was initially hypertensive 200/100's.  The decision was made to administer TNK.  Imaging showed R MCA M2 occlusion and so interventional radiology consulted.  Pt was combative in the ED requiring ativan.  Labs were significant for K 3.2, Creatinine 1.6.    Mechanical thrombectomy was performed with TICI 2A with temporary recanalization, but then subsequent re-occlusion.  Repeat imaging with contrast extravasation and likely SAH.  She was left intubated and PCCM consulted.   Pertinent  Medical History   has a past medical history of Asthma, CHF (congestive heart failure) ([place]), Depression, Hypertension, Obesity, Sickle cell trait ([place]), and Stroke ([place]).   Significant Hospital Events: Including procedures, antibiotic start and stop dates in addition to other pertinent events   12/14 presented as a code stroke, R MCA M2 occlusion, taken to IR, possible SAH post-procedure and left intubated 12/15 MRI completed  12/18 CPAP PS 5/5 and 40%,  Interim History / Subjective:   No acute events overnight Did not tolerate SBT this morning and sedation wean, displayed guppy breathing per RT and nursing ET tube advanced again 3CM today  Objective   Blood pressure (!) 104/54, pulse 64, temperature 99.5 F (37.5 C), temperature source Axillary, resp. rate 17, height '5\' 3"'$  (1.6 m), weight 103.3 kg, SpO2 98 %.    Vent Mode: PRVC FiO2 (%):  [30 %] 30 % Set Rate:  [18  bmp] 18 bmp Vt Set:  [420 mL] 420 mL PEEP:  [5 cmH20] 5 cmH20 Pressure Support:  [5 cmH20] 5 cmH20 Plateau Pressure:  [16 cmH20] 16 cmH20   Intake/Output Summary (Last 24 hours) at 10/15/2022 0747 Last data filed at 10/15/2022 0700 Gross per 24 hour  Intake 2587.59 ml  Output 2400 ml  Net 187.59 ml   Filed Weights   10/08/2022 1900 10/06/22 0500 10/07/22 0500  Weight: 102.6 kg 90.4 kg 103.3 kg    General appearance: 56 y.o., female, intubated, sedated Eyes: PERRL Lungs: mech breath sounds bl CV: RRR, no murmur  Abdomen: Soft, non-tender; non-distended, BS present  Extremities: no edema, warm Skin: Normal turgor and texture; no rash Neuro: does not follow commands  Resolved Hospital Problem list     Assessment & Plan:   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  Complicated by Post-procedure Perisylvian SAH W/ right anterior frontal and temporal cortex, right basal ganglia ischemia Cytotoxic cerebral edema  S/p thrombectomy with TICI 2a recanalization and stent retriever and aspiration but with subsequent re-occlusion and concern for SAH, left intubated P: - HTS infusion completed - crestor for HLD as below - Neuroprotective measures: HOB > 30 degrees, normoglycemia, normothermia, eucapnia, correct electrolytes - Plan for repeat head CT today by neuro  Acute hypoxemic respiratory failure  Possible aspiration pneurmonia/pneumonitis - Sputum culture GS with abundant WBC, abundant Gram negative rods, moderate gram + cocci in pairs, culture pending  - Unasyn completed 12/21 -  pulmonary hygiene - PAD guidelines sedation's with fentanyl plus precedex - PS trials not tolerated due to increased work of breathing  AKI on CKD Bladder distenstion Right Hydronephrosis - limit nephrotoxic medications - continue foley catheter - hydronephrosis resolved on repeat imaging 12/22 - Continue to trend Cr  HTN Chronic diastolic heart failure - Holding home BP meds  Type 2  DM -Holding metformin, continue SSI with CBGs  Hypokalemia / Hypernatremia - Replete/ treat  as needed - free water 280m q6hr - Trend BMET - Maintain Mag > 2  Tube feeds - Continue tube feeds - CBG and SSI  Goals of Care Pt. Has had a devastating stroke, will continue to monitor for neurological improvement, palliative care following . Patient made DNR.   Best Practice (right click and "Reselect all SmartList Selections" daily)   Diet/type: tubefeeds DVT prophylaxis: prophylactic heparin  GI prophylaxis: PPI Lines: N/A Foley:  N/A Code Status:  DNR Last date of multidisciplinary goals of care discussion [per primary stroke team.   This patient is critically ill with multiple organ system failure; which, requires frequent high complexity decision making, assessment, support, evaluation, and titration of therapies. This was completed through the application of advanced monitoring technologies and extensive interpretation of multiple databases. During this encounter critical care time was devoted to patient care services described in this note for 31 minutes.  JFreda Jackson MD L[facility] Care Office: [phone]  See Amion for personal pager PCCM on call pager [phone] 7pm. Please call Elink 7p-7a. [phone]

## 2022-10-15 NOTE — Progress Notes (Signed)
Unable to flush cortrak tube with water. Attempted with gingerale but still unsuccessful. Initiated unclogging protocol, but unable to flush solution into tube. Notified MD with order to remove cortrak and place OG tube. Placed OG without complicaiton and placed order for confirmatory xray.

## 2022-10-15 NOTE — Progress Notes (Signed)
Transported to and from 4N18 to CT2 on vent. Pt stable throughout with no complications. VS WNL

## 2022-10-15 NOTE — Progress Notes (Addendum)
STROKE TEAM PROGRESS NOTE   INTERVAL HISTORY Daughter is at the bedside.  Patient still intubated, on fentanyl and precedex. Not following commands, eyes spontaneous open, but not tracking not blinking to visual threat. Right pupil 41m, left 133m and right mild ptosis, concerning for uncal herniation.   Vitals:   10/14/22 2100 10/14/22 2200 10/14/22 2300 10/15/22 0000  BP: (!) 91/56 (!) 96/59 (!) 98/54   Pulse: 60 (!) 59 (!) 59   Resp: '15 15 16   '$ Temp:    98.5 F (36.9 C)  TempSrc:    Axillary  SpO2: 98% 96% 98%   Weight:      Height:       CBC:  Recent Labs  Lab 10/09/22 0619 10/11/22 0905 10/13/22 0610 10/14/22 0626  WBC 14.6* 11.7* 12.7* 13.5*  NEUTROABS 10.8* 7.7  --   --   HGB 13.1 11.2* 10.7* 10.9*  HCT 39.9 35.1* 32.9* 33.7*  MCV 92.4 93.9 92.7 92.8  PLT 176 151 147* 14782  Basic Metabolic Panel:  Recent Labs  Lab 10/11/22 0905 10/12/22 0734 10/12/22 1045 10/13/22 0610 10/13/22 1200 10/14/22 0626 10/14/22 1219 10/14/22 1758  NA 152* 143   < > 156*  157*   < > 156* 156* 159*  K 3.9 3.5   < > 4.2  --  4.4  --   --   CL 128* 116*   < > >130*  --  >130*  --   --   CO2 19* 17*   < > 17*  --  17*  --   --   GLUCOSE 165* 350*   < > 123*  --  124*  --   --   BUN 82* 85*   < > 101*  --  108*  --   --   CREATININE 3.77* 3.49*   < > 3.80*  --  3.69*  --   --   CALCIUM 7.7* 7.0*   < > 8.0*  --  8.3*  --   --   MG 2.5* 2.3  --   --   --   --   --   --    < > = values in this interval not displayed.   Lipid Panel:  Recent Labs  Lab 10/09/22 0619  TRIG 311*    IMAGING past 24 hours No results found.  PHYSICAL EXAM  Physical Exam   Temp:  [98.3 F (36.8 C)-99.5 F (37.5 C)] 98.5 F (36.9 C) (12/25 0000) Pulse Rate:  [57-71] 59 (12/24 2300) Resp:  [12-34] 16 (12/24 2300) BP: (91-116)/(54-66) 98/54 (12/24 2300) SpO2:  [96 %-100 %] 98 % (12/24 2300) FiO2 (%):  [30 %] 30 % (12/24 1937)  General - Well nourished, well developed, intubated on precedex  and fentanyl.  Ophthalmologic - fundi not visualized due to noncooperation.  Cardiovascular - Regular rate and rhythm.  Neuro - intubated on precedex and fentanyl, eyes half way open on voice, left eye palpebral fissure larger than right, not following commands. Eyes in mid position, not blinking to visual threat, doll's eyes sluggish, not tracking, PERRL. Corneal reflex present, gag and cough present. Breathing over the vent.  Facial symmetry not able to test due to ET tube.  Tongue protrusion not cooperative. On pain stimulation, no movement in all extremities. Sensation, coordination and gait not tested.   ASSESSMENT/PLAN Ms. ArNAILANI FULLs a [age].o. female with history of CHF, hypertension, obesity, sickle cell trait, prior stroke, cocaine use,  presenting for evaluation of sudden onset of left-sided weakness with weakness last known well at 6 PM.  She was given TNK and emergently taken for a mechanical thrombectomy of her M2 MCA posterior division branch.  They achieved temporary recanalization with subsequent reocclusion.  Flat panel CT showed intense contrast blushing and edema of the right frontal and anterior temporal cortex and basal ganglia, suggesting ongoing ischemia . A small right Sylvian hyperdensity is also seen, suggesting contrast extravasation with likely associated SAH.  She returned to ICU intubated.  MRI showed right MCA large infarct.  MRA showed right M2 persistent occlusion.   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 ASPECTS is 8.  CTA head & neck Right MCA M2 segment occlusion with poor collateralization. S/p IR with temporary recanalization with subsequent reocclusion (TICI 2A)  CT Head- Right perisylvian SAH/contrast has diffused but not clearly increased. Continue contrast blush extensively within right anterior frontal and temporal cortex and in the  right basal ganglia ischemia, ACA and MCA territory is involved. Additional cytotoxic edema in the lateral and high right frontal cortex. MRI Brain- Large area of early subacute ischemia throughout the posterior right MCA territory. Mass effect on the right lateral ventricle with trace leftward midline shift. MRA Brain- No flow related enhancement seen in the right MCA M2 branches aside from the most anterior part of the superior division, unchanged from yesterday's CTA. 2D Echo EF 60-65%  LDL 174 HgbA1c 5.5 UDS cocaine positive VTE prophylaxis - heparin subq No antithrombotic prior to admission, now on ASA 325 Therapy recommendations: Pending Disposition: had long discussion with daughter for [place], she requested comfort care after Christmas.   Cerebral edema Hyponatremia Hypertonic saline initiated at 37m/hr- Now off  D5 560mhr ordered by CCM -> off Sodium Q6 hr  Na 152->156->159 On FW 200 Q8->Q6 Allow Na gradually trending down CT head 12/22 Progressive edema associated with a large right MCA territory infarct. Resulting increased mass effect with 6 mm of leftward midline shift. Petechial hemorrhage. Increased dilation of the left temporal horn, suspicious for ventricular entrapment. CT head repeat in am PICC line in place  Hypertension Home meds: Amlodipine Stable on the low end On amlodipine 10 Off Cleviprex  BP goal < 160  Hyperlipidemia Home meds: Atorvastatin 20 mg LDL 174, goal < 70 Put on crestor 40 Continue statin at discharge  Acute Hypoxic respiratory failure  CCM on board Remains intubated post procedure Wean to extubate per CCM team  Cocaine abuse Hx of cocaine use UDS positive for cocaine this time Cessation education will be provided.   Tobacco abuse Current smoker Smoking cessation counseling will be provided  Alcohol abuse Per daughter, she is drinking frequently Not compliant with medication with drinking CIWA protocol   AKI  baseline Cr  1.5 Cr 2.79-> 3.49 -> 3.66 -> 3.80->3.69 on TF and FW  Fever and leukocytosis Tmax 101.5-> afebrile WBC 11.8 -> 15 -> 14.6 -> 11.7->12.7->13.5 UA WBC 6-10 CXR 12/23 mild cardiomegaly and bilateral interstitial thickening with subtle intervening groundglass opacities suspected to be pulmonary edema  Other Stroke Risk Factors Obesity, Body mass index is 40.34 kg/m., BMI >/= 30 associated with increased stroke risk, recommend weight loss, diet and exercise as appropriate  Hx of stroke Congestive heart failure  Other Active Problems Depression Sertraline 50 mg Hypokalemia Repleted, K now 3.9->4.2->4.4   Hospital day # 11  This  patient is critically ill due to Large right MCA stroke, cerebral edema, alcohol abuse, respiratory failure and at significant risk of neurological worsening, death form recurrent stroke, hemorrhagic transformation, brain herniation, seizure, renal failure. This patient's care requires constant monitoring of vital signs, hemodynamics, respiratory and cardiac monitoring, review of multiple databases, neurological assessment, discussion with family, other specialists and medical decision making of high complexity. I spent 35 minutes of neurocritical care time in the care of this patient. I had long discussion with daughter at bedside, updated pt current condition, treatment plan and potential prognosis, and answered all the questions. She expressed understanding and appreciation and she requested comfort care measures after Christmas after discussion with other family members.    Rosalin Hawking, MD PhD Stroke Neurology 10/15/2022 12:37 AM   To contact Stroke Continuity provider, please refer to [email or web address] After hours, contact General Neurology

## 2022-10-16 LAB — BASIC METABOLIC PANEL
BUN: 108 mg/dL — ABNORMAL HIGH (ref 6–20)
CO2: 17 mmol/L — ABNORMAL LOW (ref 22–32)
Calcium: 8.4 mg/dL — ABNORMAL LOW (ref 8.9–10.3)
Chloride: >130 mmol/L (ref 98–111)
Creatinine, Ser: 3.19 mg/dL — ABNORMAL HIGH (ref 0.44–1.00)
GFR, Estimated: 17 mL/min — ABNORMAL LOW (ref >60)
Glucose, Bld: 114 mg/dL — ABNORMAL HIGH (ref 70–99)
Potassium: 4.5 mmol/L (ref 3.5–5.1)
Sodium: 159 mmol/L — ABNORMAL HIGH (ref 135–145)

## 2022-10-16 LAB — GLUCOSE, CAPILLARY
Glucose-Capillary: 118 mg/dL — ABNORMAL HIGH (ref 70–99)
Glucose-Capillary: 122 mg/dL — ABNORMAL HIGH (ref 70–99)

## 2022-10-16 LAB — SODIUM: Sodium: 160 mmol/L — ABNORMAL HIGH (ref 135–145)

## 2022-10-16 LAB — CBC
HCT: 33.5 % — ABNORMAL LOW (ref 36.0–46.0)
Hemoglobin: 10.7 g/dL — ABNORMAL LOW (ref 12.0–15.0)
MCH: 30 pg (ref 26.0–34.0)
MCHC: 31.9 g/dL (ref 30.0–36.0)
MCV: 93.8 fL (ref 80.0–100.0)
Platelets: 189 10*3/uL (ref 150–400)
RBC: 3.57 MIL/uL — ABNORMAL LOW (ref 3.87–5.11)
RDW: 15.2 % (ref 11.5–15.5)
WBC: 12.7 10*3/uL — ABNORMAL HIGH (ref 4.0–10.5)
nRBC: 0 % (ref 0.0–0.2)

## 2022-10-16 MED ORDER — HALOPERIDOL LACTATE 5 MG/ML IJ SOLN
0.5000 mg | INTRAMUSCULAR | Status: DC | PRN
  Administered 2022-10-16: 0.5 mg via INTRAVENOUS
  Filled 2022-10-16: qty 1

## 2022-10-16 MED ORDER — GLYCOPYRROLATE 0.2 MG/ML IJ SOLN
0.2000 mg | INTRAMUSCULAR | Status: DC
  Administered 2022-10-16 – 2022-10-19 (×16): 0.2 mg via INTRAVENOUS
  Filled 2022-10-16 (×13): qty 1

## 2022-10-16 MED ORDER — GLYCOPYRROLATE 1 MG PO TABS: 1.0000 mg | ORAL_TABLET | ORAL | Status: DC | PRN

## 2022-10-16 MED ORDER — SCOPOLAMINE 1 MG/3DAYS TD PT72
1.0000 | MEDICATED_PATCH | TRANSDERMAL | Status: DC
  Filled 2022-10-16 (×2): qty 1

## 2022-10-16 MED ORDER — POLYVINYL ALCOHOL 1.4 % OP SOLN: 1.0000 [drp] | Freq: Four times a day (QID) | OPHTHALMIC | Status: DC | PRN

## 2022-10-16 MED ORDER — LORAZEPAM 2 MG/ML IJ SOLN
4.0000 mg | Freq: Once | INTRAMUSCULAR | Status: AC
  Administered 2022-10-16: 4 mg via INTRAVENOUS

## 2022-10-16 MED ORDER — GLYCOPYRROLATE 0.2 MG/ML IJ SOLN
0.2000 mg | INTRAMUSCULAR | Status: DC | PRN
  Filled 2022-10-16: qty 1

## 2022-10-16 MED ORDER — ONDANSETRON HCL 4 MG/2ML IJ SOLN: 4.0000 mg | Freq: Four times a day (QID) | INTRAMUSCULAR | Status: DC | PRN

## 2022-10-16 MED ORDER — ACETAMINOPHEN 325 MG PO TABS: 650.0000 mg | ORAL_TABLET | Freq: Four times a day (QID) | ORAL | Status: DC | PRN

## 2022-10-16 MED ORDER — GLYCOPYRROLATE 0.2 MG/ML IJ SOLN: 0.2000 mg | INTRAMUSCULAR | Status: DC | PRN

## 2022-10-16 MED ORDER — HALOPERIDOL 1 MG PO TABS: 0.5000 mg | ORAL_TABLET | ORAL | Status: DC | PRN

## 2022-10-16 MED ORDER — LORAZEPAM 2 MG/ML IJ SOLN
INTRAMUSCULAR | Status: AC
  Filled 2022-10-16: qty 2

## 2022-10-16 MED ORDER — ACETAMINOPHEN 650 MG RE SUPP: 650.0000 mg | Freq: Four times a day (QID) | RECTAL | Status: DC | PRN

## 2022-10-16 MED ORDER — HALOPERIDOL LACTATE 2 MG/ML PO CONC: 0.5000 mg | ORAL | Status: DC | PRN

## 2022-10-16 MED ORDER — BIOTENE DRY MOUTH MT LIQD: 15.0000 mL | OROMUCOSAL | Status: DC | PRN

## 2022-10-16 MED ORDER — ONDANSETRON 4 MG PO TBDP: 4.0000 mg | ORAL_TABLET | Freq: Four times a day (QID) | ORAL | Status: DC | PRN

## 2022-10-16 NOTE — Procedures (Signed)
Extubation Procedure Note  Patient Details:   Name: Taylor Vazquez DOB: 12/21/1965 MRN: [id number]   Airway Documentation:    Vent end date: 10/16/22 Vent end time: 1215   Extubated per withdrawal of life protocol  Jesse Sans 10/16/2022, 12:17 PM

## 2022-10-16 NOTE — Progress Notes (Signed)
STROKE TEAM PROGRESS NOTE   INTERVAL HISTORY Whole family is at the bedside.  Patient still intubated, not following commands, not moving extremities even with pain.  Again had long discussion with family regarding poor prognosis, daughter decided on comfort care measures.  Patient was put on comfort care orders, subsequently extubated to comfort.  Vitals:   10/16/22 1000 10/16/22 1100 10/16/22 1200 10/16/22 1215  BP: (!) 152/62 (!) 144/65 (!) 130/57   Pulse: (!) 103 (!) 102 94   Resp: '17 16 19   '$ Temp:      TempSrc:      SpO2: 94% 99% 98% 91%  Weight:      Height:       CBC:  Recent Labs  Lab 10/11/22 0905 10/13/22 0610 10/15/22 0617 10/16/22 0417  WBC 11.7*   < > 14.2* 12.7*  NEUTROABS 7.7  --   --   --   HGB 11.2*   < > 10.9* 10.7*  HCT 35.1*   < > 35.3* 33.5*  MCV 93.9   < > 94.9 93.8  PLT 151   < > 170 189   < > = values in this interval not displayed.   Basic Metabolic Panel:  Recent Labs  Lab 10/11/22 0905 10/12/22 0734 10/12/22 1045 10/15/22 0617 10/15/22 1358 10/16/22 0120 10/16/22 0417  NA 152* 143   < > 160*   < > 160* 159*  K 3.9 3.5   < > 5.0  --   --  4.5  CL 128* 116*   < > >130*  --   --  >130*  CO2 19* 17*   < > 18*  --   --  17*  GLUCOSE 165* 350*   < > 116*  --   --  114*  BUN 82* 85*   < > 112*  --   --  108*  CREATININE 3.77* 3.49*   < > 3.55*  --   --  3.19*  CALCIUM 7.7* 7.0*   < > 8.8*  --   --  8.4*  MG 2.5* 2.3  --   --   --   --   --    < > = values in this interval not displayed.   Lipid Panel:  No results for input(s): "CHOL", "TRIG", "HDL", "CHOLHDL", "VLDL", "LDLCALC" in the last 168 hours.   IMAGING past 24 hours No results found.  PHYSICAL EXAM  Physical Exam   Temp:  [98.5 F (36.9 C)-99.1 F (37.3 C)] 98.5 F (36.9 C) (12/26 0800) Pulse Rate:  [71-122] 94 (12/26 1200) Resp:  [12-22] 19 (12/26 1200) BP: (100-153)/(53-86) 130/57 (12/26 1200) SpO2:  [91 %-100 %] 91 % (12/26 1215) FiO2 (%):  [30 %] 30 % (12/26  0721) Weight:  [87.9 kg] 87.9 kg (12/26 0400)  General - Well nourished, well developed, intubated on precedex and fentanyl.  Ophthalmologic - fundi not visualized due to noncooperation.  Cardiovascular - Regular rate and rhythm.  Neuro - intubated on precedex and fentanyl, eyes open spontaneously, left eye palpebral fissure larger than right, not following commands. Eyes in mid position, not blinking to visual threat, doll's eyes sluggish, not tracking, right pupil 32m, left pupil 1.576m sluggish to light b/l. Corneal reflex present, gag and cough present. Breathing over the vent.  Facial symmetry not able to test due to ET tube.  Tongue protrusion not cooperative. On pain stimulation, no movement in all extremities. Sensation, coordination and gait not tested.   ASSESSMENT/PLAN Ms.  Taylor Vazquez is a 56 y.o. female with history of CHF, hypertension, obesity, sickle cell trait, prior stroke, cocaine use, presenting for evaluation of sudden onset of left-sided weakness with weakness last known well at 6 PM.  She was given TNK and emergently taken for a mechanical thrombectomy of her M2 MCA posterior division branch.  They achieved temporary recanalization with subsequent reocclusion.  Flat panel CT showed intense contrast blushing and edema of the right frontal and anterior temporal cortex and basal ganglia, suggesting ongoing ischemia . A small right Sylvian hyperdensity is also seen, suggesting contrast extravasation with likely associated SAH.  She returned to ICU intubated.  MRI showed right MCA large infarct.  MRA showed right M2 persistent occlusion.   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 ASPECTS is 8.  CTA head & neck Right MCA M2 segment occlusion with poor collateralization. S/p IR with temporary recanalization with subsequent reocclusion (TICI 2A)  CT Head- Right  perisylvian SAH/contrast has diffused but not clearly increased. Continue contrast blush extensively within right anterior frontal and temporal cortex and in the right basal ganglia ischemia, ACA and MCA territory is involved. Additional cytotoxic edema in the lateral and high right frontal cortex. MRI Brain- Large area of early subacute ischemia throughout the posterior right MCA territory. Mass effect on the right lateral ventricle with trace leftward midline shift. MRA Brain- No flow related enhancement seen in the right MCA M2 branches aside from the most anterior part of the superior division, unchanged from yesterday's CTA. 2D Echo EF 60-65%  LDL 174 HgbA1c 5.5 UDS cocaine positive VTE prophylaxis - heparin subq -> DC No antithrombotic prior to admission, was on ASA 325, now off given complicated measures.  Disposition: had long discussion with daughter and family for [place], she requested comfort care measures.   Cerebral edema Hyponatremia Hypertonic saline initiated at 82m/hr- Now off  Sodium Q6 hr -> DC Na 152->156->159->160 On FW 200 Q8->Q6->Q4h Allow Na gradually trending down CT head 12/22 Progressive edema associated with a large right MCA territory infarct. Resulting increased mass effect with 6 mm of leftward midline shift. Petechial hemorrhage. Increased dilation of the left temporal horn, suspicious for ventricular entrapment. CT head repeat 12/25 showed slightly decreased MLS PICC line in place -> d/c for comfort  Hypertension Home meds: Amlodipine Stable on the low end Was on amlodipine 10 Off Cleviprex   Hyperlipidemia Home meds: Atorvastatin 20 mg LDL 174, goal < 70 Was on crestor 40  Acute Hypoxic respiratory failure  CCM on board Remains intubated post procedure Extubated for comfort care measures 12/26  Cocaine abuse Hx of cocaine use UDS positive for cocaine this time  Tobacco abuse Current smoker  Alcohol abuse Per daughter, she was drinking  frequently Not compliant with medication with drinking Was on CIWA protocol   AKI  baseline Cr 1.5 Cr 2.79-> 3.49 -> 3.66 -> 3.80->3.69->3.55 Was on TF and FW  Fever and leukocytosis Tmax 101.5-> afebrile WBC 11.8 -> 15 -> 14.6 -> 11.7->12.7->13.5->14.2 UA WBC 6-10 CXR 12/23 mild cardiomegaly and bilateral interstitial thickening with subtle intervening groundglass opacities suspected to be pulmonary edema  Other Stroke Risk Factors Obesity, Body mass index is 34.33 kg/m., BMI >/= 30 associated with increased stroke risk, recommend weight loss, diet and exercise as appropriate  Hx of stroke Congestive heart failure  Other Active Problems Depression Sertraline 50 mg Hypokalemia  Hospital day # 28  I had long discussion with daughter and other family members at bedside, updated pt current condition, treatment plan and poor prognosis, and answered all the questions.  They request comfort care measures.  Patient was put on comfort care orders and subacutely extubated for comfort   Rosalin Hawking, MD PhD Stroke Neurology 10/16/2022 7:47 PM   To contact Stroke Continuity provider, please refer to [email or web address] After hours, contact General Neurology

## 2022-10-16 NOTE — Progress Notes (Signed)
Pt w/ critical lab Chloride >130. Elink RN informed.

## 2022-10-16 NOTE — Progress Notes (Signed)
Nutrition Brief Note  Chart reviewed. Pt now transitioning to comfort care.  No further nutrition interventions planned at this time.  Please re-consult as needed.   Lockie Pares., RD, LDN, CNSC See AMiON for contact information

## 2022-10-16 NOTE — Progress Notes (Signed)
   NAME:  Taylor Vazquez, MRN:  [id number], DOB:  07/06/1966, LOS: 12 ADMISSION DATE:  09/27/2022, CONSULTATION DATE:  12/14 REFERRING MD:  Rory Percy, CHIEF COMPLAINT:  stroke   History of Present Illness:  56 yo female admitted with R MCA M2 occlusion stroke, received TNK, then IR consult for mechanical thrombectomy on 12/14.    Pertinent  Medical History  Asthma, CHF (congestive heart failure) ([place]), Depression, Hypertension, Obesity, Sickle cell trait ([place]), and Stroke ([place])   Significant Hospital Events: Including procedures, antibiotic start and stop dates in addition to other pertinent events   12/14 presented as a code stroke, R MCA M2 occlusion, taken to IR, possible SAH post-procedure and left intubated 12/15 MRI completed > Large R subacute MCA territory stroke, mass effect with midline shift 12/18 CPAP PS 5/5 and 40% 12/25 Large R MCA territory infarct with petechial hemorrhage, no hematoma, decreased mass effect, soft tissue thickening R ear  Interim History / Subjective:  Neuro discussion: planning comfort measures  Objective   Blood pressure (!) 147/77, pulse 95, temperature 98.5 F (36.9 C), temperature source Axillary, resp. rate 12, height '5\' 3"'$  (1.6 m), weight 87.9 kg, SpO2 98 %.    Vent Mode: PRVC FiO2 (%):  [30 %] 30 % Set Rate:  [18 bmp] 18 bmp Vt Set:  [420 mL] 420 mL PEEP:  [5 cmH20] 5 cmH20 Plateau Pressure:  [16 cmH20] 16 cmH20   Intake/Output Summary (Last 24 hours) at 10/16/2022 0844 Last data filed at 10/16/2022 0800 Gross per 24 hour  Intake 1800.84 ml  Output 2560 ml  Net -759.16 ml   Filed Weights   10/06/22 0500 10/07/22 0500 10/16/22 0400  Weight: 90.4 kg 103.3 kg 87.9 kg    Examination:  General:  In bed on vent HENT: NCAT ETT in place PULM: Rhonchi bilaterally B, vent supported breathing CV: RRR, no mgr GI: BS+, soft, nontender MSK: normal bulk and tone Neuro: eyes open, coughing, doesn't follow commands    Resolved Hospital  Problem list   Aspiration pneumonia  Assessment & Plan:  Acute R MCA stroke, s/p TNK and mechanical thrombectomy on 12/14 Post thrombectomy SAH Cytotoxic cerebral edema treated with hypertonic saline boluses Acute hypoxemic respiratory failure due to inabilty to proect airway AKI Hypernatremia, iatrogenic Hypertension Chronic diastolic heart afilure DM2 Cocaine abuse Tobacco abuse  Discussion: Difficult situation with devastating neurologic injury.  Family planning comfort measures.  Plan: Full mechanical vent support VAP prevention Low dose free water to continue Monitor fever curve Monitor hemodynamics Glu goal 140-180 SBP goal < 160 Tube feeding Add robinul for secretions  Best Practice (right click and "Reselect all SmartList Selections" daily)   Per Primary I spoke to [place], daughter bedside  Critical care time: 30 minutes    Roselie Awkward, MD [facility] Pager: [phone] Cell: [phone] After 7:00 pm call Elink  [phone]

## 2022-10-17 MED ORDER — LORAZEPAM 2 MG/ML IJ SOLN
1.0000 mg | INTRAMUSCULAR | Status: DC | PRN
  Filled 2022-10-17: qty 1

## 2022-10-17 MED ORDER — LORAZEPAM 1 MG PO TABS: 1.0000 mg | ORAL_TABLET | ORAL | Status: DC | PRN

## 2022-10-17 MED ORDER — LORAZEPAM 2 MG/ML PO CONC: 1.0000 mg | ORAL | Status: DC | PRN

## 2022-10-17 MED ORDER — ACETAMINOPHEN 10 MG/ML IV SOLN
1000.0000 mg | Freq: Four times a day (QID) | INTRAVENOUS | Status: AC
  Administered 2022-10-17 – 2022-10-18 (×3): 1000 mg via INTRAVENOUS
  Filled 2022-10-17 (×4): qty 100

## 2022-10-17 MED ORDER — ALTEPLASE 2 MG IJ SOLR
2.0000 mg | Freq: Once | INTRAMUSCULAR | Status: AC
  Administered 2022-10-17: 2 mg

## 2022-10-17 MED ORDER — MORPHINE SULFATE (PF) 2 MG/ML IV SOLN
1.0000 mg | INTRAVENOUS | Status: DC | PRN
  Administered 2022-10-17 (×2): 1 mg via INTRAVENOUS
  Filled 2022-10-17 (×2): qty 1

## 2022-10-17 NOTE — Plan of Care (Signed)
  Problem: Ischemic Stroke/TIA Tissue Perfusion: Goal: Complications of ischemic stroke/TIA will be minimized Outcome: Progressing   Problem: Education: Goal: Individualized Educational Video(s) Outcome: Progressing   Problem: Activity: Goal: Ability to return to baseline activity level will improve Outcome: Progressing   Problem: Cardiovascular: Goal: Ability to achieve and maintain adequate cardiovascular perfusion will improve Outcome: Progressing Goal: Vascular access site(s) Level 0-1 will be maintained Outcome: Progressing   Problem: Health Behavior/Discharge Planning: Goal: Ability to safely manage health-related needs after discharge will improve Outcome: Progressing   Problem: Education: Goal: Ability to describe self-care measures that may prevent or decrease complications (Diabetes Survival Skills Education) will improve Outcome: Progressing Goal: Individualized Educational Video(s) Outcome: Progressing   Problem: Coping: Goal: Ability to adjust to condition or change in health will improve Outcome: Progressing   Problem: Fluid Volume: Goal: Ability to maintain a balanced intake and output will improve Outcome: Progressing   Problem: Health Behavior/Discharge Planning: Goal: Ability to identify and utilize available resources and services will improve Outcome: Progressing Goal: Ability to manage health-related needs will improve Outcome: Progressing   Problem: Metabolic: Goal: Ability to maintain appropriate glucose levels will improve Outcome: Progressing   Problem: Nutritional: Goal: Maintenance of adequate nutrition will improve Outcome: Progressing Goal: Progress toward achieving an optimal weight will improve Outcome: Progressing   Problem: Skin Integrity: Goal: Risk for impaired skin integrity will decrease Outcome: Progressing   Problem: Tissue Perfusion: Goal: Adequacy of tissue perfusion will improve Outcome: Progressing   Problem:  Clinical Measurements: Goal: Will remain free from infection Outcome: Progressing Goal: Cardiovascular complication will be avoided Outcome: Progressing   Problem: Elimination: Goal: Will not experience complications related to bowel motility Outcome: Progressing Goal: Will not experience complications related to urinary retention Outcome: Progressing   Problem: Pain Managment: Goal: General experience of comfort will improve Outcome: Progressing   Problem: Safety: Goal: Ability to remain free from injury will improve Outcome: Progressing   Problem: Skin Integrity: Goal: Risk for impaired skin integrity will decrease Outcome: Progressing

## 2022-10-17 NOTE — Progress Notes (Signed)
STROKE TEAM PROGRESS NOTE   INTERVAL HISTORY Daughters are at the bedside. Pt on comfort care measures now, has abnormal breathing pattern, seems not completely comfortable. Now maximized with fentanyl. Will given morphine IV PRN, add ativan on top of haldol PRN for agitation. Pt also developed fever, given tylenol. D/c rectal tube and CBG monitoring for comfort. Urinary output increased, concerning for DI.   Vitals:   10/16/22 1215 10/17/22 0753 10/17/22 1857 10/17/22 2019  BP:  139/61  109/63  Pulse:  (!) 113  (!) 112  Resp:  15  13  Temp:  (!) 97.5 F (36.4 C) (!) 101.8 F (38.8 C) (!) 101.5 F (38.6 C)  TempSrc:  Axillary Axillary Axillary  SpO2: 91% (!) 82%  (!) 66%  Weight:      Height:       CBC:  Recent Labs  Lab 10/11/22 0905 10/13/22 0610 10/15/22 0617 10/16/22 0417  WBC 11.7*   < > 14.2* 12.7*  NEUTROABS 7.7  --   --   --   HGB 11.2*   < > 10.9* 10.7*  HCT 35.1*   < > 35.3* 33.5*  MCV 93.9   < > 94.9 93.8  PLT 151   < > 170 189   < > = values in this interval not displayed.   Basic Metabolic Panel:  Recent Labs  Lab 10/11/22 0905 10/12/22 0734 10/12/22 1045 10/15/22 0617 10/15/22 1358 10/16/22 0120 10/16/22 0417  NA 152* 143   < > 160*   < > 160* 159*  K 3.9 3.5   < > 5.0  --   --  4.5  CL 128* 116*   < > >130*  --   --  >130*  CO2 19* 17*   < > 18*  --   --  17*  GLUCOSE 165* 350*   < > 116*  --   --  114*  BUN 82* 85*   < > 112*  --   --  108*  CREATININE 3.77* 3.49*   < > 3.55*  --   --  3.19*  CALCIUM 7.7* 7.0*   < > 8.8*  --   --  8.4*  MG 2.5* 2.3  --   --   --   --   --    < > = values in this interval not displayed.   Lipid Panel:  No results for input(s): "CHOL", "TRIG", "HDL", "CHOLHDL", "VLDL", "LDLCALC" in the last 168 hours.   IMAGING past 24 hours No results found.  PHYSICAL EXAM  Physical Exam   Temp:  [97.5 F (36.4 C)-101.8 F (38.8 C)] 101.5 F (38.6 C) (12/27 2019) Pulse Rate:  [112-113] 112 (12/27 2019) Resp:   [13-15] 13 (12/27 2019) BP: (109-139)/(61-63) 109/63 (12/27 2019) SpO2:  [66 %-82 %] 66 % (12/27 2019)  Limited exam due to comfort care measures. Pt not responsive, not open eyes on voice. No spontaneous movement. Abnormal breathing pattern.    ASSESSMENT/PLAN Ms. Taylor Vazquez is a 56 y.o. female with history of CHF, hypertension, obesity, sickle cell trait, prior stroke, cocaine use, presenting for evaluation of sudden onset of left-sided weakness with weakness last known well at 6 PM.  She was given TNK and emergently taken for a mechanical thrombectomy of her M2 MCA posterior division branch.  They achieved temporary recanalization with subsequent reocclusion.  Flat panel CT showed intense contrast blushing and edema of the right frontal and anterior temporal cortex and basal ganglia,  suggesting ongoing ischemia . A small right Sylvian hyperdensity is also seen, suggesting contrast extravasation with likely associated SAH.  She returned to ICU intubated.  MRI showed right MCA large infarct.  MRA showed right M2 persistent occlusion.   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 ASPECTS is 8.  CTA head & neck Right MCA M2 segment occlusion with poor collateralization. S/p IR with temporary recanalization with subsequent reocclusion (TICI 2A)  CT Head- Right perisylvian SAH/contrast has diffused but not clearly increased. Continue contrast blush extensively within right anterior frontal and temporal cortex and in the right basal ganglia ischemia, ACA and MCA territory is involved. Additional cytotoxic edema in the lateral and high right frontal cortex. MRI Brain- Large area of early subacute ischemia throughout the posterior right MCA territory. Mass effect on the right lateral ventricle with trace leftward midline shift. MRA Brain- No flow related enhancement seen in the right MCA M2  branches aside from the most anterior part of the superior division, unchanged from yesterday's CTA. 2D Echo EF 60-65%  LDL 174 HgbA1c 5.5 UDS cocaine positive VTE prophylaxis - heparin subq -> DC No antithrombotic prior to admission, was on ASA 325, now off given complicated measures.  Disposition: now on comfort care measures, anticipating hospital death  Cerebral edema Hyponatremia Hypertonic saline initiated at 19m/hr- Now off  Sodium Q6 hr -> DC Na 152->156->159->160 On FW 200 Q8->Q6->Q4h->off Allow Na gradually trending down CT head 12/22 Progressive edema associated with a large right MCA territory infarct. Resulting increased mass effect with 6 mm of leftward midline shift. Petechial hemorrhage. Increased dilation of the left temporal horn, suspicious for ventricular entrapment. CT head repeat 12/25 showed slightly decreased MLS PICC line in place -> d/c for comfort  Hypertension Home meds: Amlodipine Stable on the low end Was on amlodipine 10 Off Cleviprex   Hyperlipidemia Home meds: Atorvastatin 20 mg LDL 174, goal < 70 Was on crestor 40  Acute Hypoxic respiratory failure  CCM on board Remains intubated post procedure Extubated for comfort care measures 12/26  Cocaine abuse Hx of cocaine use UDS positive for cocaine this time  Tobacco abuse Current smoker  Alcohol abuse Per daughter, she was drinking frequently Not compliant with medication with drinking Was on CIWA protocol   AKI  baseline Cr 1.5 Cr 2.79-> 3.49 -> 3.66 -> 3.80->3.69->3.55 Was on TF and FW  Fever and leukocytosis Tmax 101.5-> afebrile->101.8 WBC 11.8 -> 15 -> 14.6 -> 11.7->12.7->13.5->14.2 UA WBC 6-10 CXR 12/23 mild cardiomegaly and bilateral interstitial thickening with subtle intervening groundglass opacities suspected to be pulmonary edema Tylenol PRN  Other Stroke Risk Factors Obesity, Body mass index is 34.33 kg/m., BMI >/= 30 associated with increased stroke risk,  recommend weight loss, diet and exercise as appropriate  Hx of stroke Congestive heart failure  Other Active Problems Depression Sertraline 50 mg -> off Hypokalemia Urine output increased -> [facility]   JRosalin Hawking MD PhD Stroke Neurology 10/17/2022 10:09 PM   To contact Stroke Continuity provider, please refer to [email or web address] After hours, contact General Neurology

## 2022-10-17 NOTE — Progress Notes (Signed)
Patients temp 101.8 axillary. Dr. Erlinda Hong notified new orders received. Report given to Ulyses Jarred, RN

## 2022-10-18 DIAGNOSIS — J9601 Acute respiratory failure with hypoxia: Secondary | ICD-10-CM

## 2022-10-18 DIAGNOSIS — J9602 Acute respiratory failure with hypercapnia: Secondary | ICD-10-CM

## 2022-10-18 NOTE — Progress Notes (Signed)
RN entered room for report and spoke with family. Lanelle Bal, daughter, was the spokesperson for the family at this time.  Family wishes to be contacted if any changes occur, number on white board in room.  Lanelle Bal said she is not opposed to giving mother prn morphine dose if needed but requests to be called before the administration is given.  Family wants to leave the rectal tube in place for now.  Family will not be staying tonight, they are going home to rest but want to be notified immediately if there are changes.

## 2022-10-18 NOTE — Progress Notes (Signed)
STROKE TEAM PROGRESS NOTE   INTERVAL HISTORY Daughter and RN are at the bedside. Pt still has abnormal breathing pattern and copious secretions. Has been doing overnight oral suctioning, pt no gag or cough, deemed not discomfort by RN and daughter. Still has diarrhea on rectal tube, deemed to be comfort measures. Had fever, on IV tylenol.   Vitals:   10/17/22 0753 10/17/22 1857 10/17/22 2019 10/18/22 0752  BP: 139/61  109/63 (!) 99/57  Pulse: (!) 113  (!) 112 (!) 107  Resp: '15  13 10  '$ Temp: (!) 97.5 F (36.4 C) (!) 101.8 F (38.8 C) (!) 101.5 F (38.6 C) 99.5 F (37.5 C)  TempSrc: Axillary Axillary Axillary Oral  SpO2: (!) 82%  (!) 66% (!) 57%  Weight:      Height:       CBC:  Recent Labs  Lab 10/15/22 0617 10/16/22 0417  WBC 14.2* 12.7*  HGB 10.9* 10.7*  HCT 35.3* 33.5*  MCV 94.9 93.8  PLT 170 914   Basic Metabolic Panel:  Recent Labs  Lab 10/12/22 0734 10/12/22 1045 10/15/22 0617 10/15/22 1358 10/16/22 0120 10/16/22 0417  NA 143   < > 160*   < > 160* 159*  K 3.5   < > 5.0  --   --  4.5  CL 116*   < > >130*  --   --  >130*  CO2 17*   < > 18*  --   --  17*  GLUCOSE 350*   < > 116*  --   --  114*  BUN 85*   < > 112*  --   --  108*  CREATININE 3.49*   < > 3.55*  --   --  3.19*  CALCIUM 7.0*   < > 8.8*  --   --  8.4*  MG 2.3  --   --   --   --   --    < > = values in this interval not displayed.    IMAGING past 24 hours No results found.  PHYSICAL EXAM  Physical Exam   Temp:  [99.5 F (37.5 C)-101.8 F (38.8 C)] 99.5 F (37.5 C) (12/28 0752) Pulse Rate:  [107-112] 107 (12/28 0752) Resp:  [10-13] 10 (12/28 0752) BP: (99-109)/(57-63) 99/57 (12/28 0752) SpO2:  [57 %-66 %] 57 % (12/28 0752)  Limited exam due to comfort care measures. Pt not responsive, not open eyes on voice. No spontaneous movement. Abnormal breathing pattern.    ASSESSMENT/PLAN Taylor Vazquez is a 56 y.o. female with history of CHF, hypertension, obesity, sickle cell trait,  prior stroke, cocaine use, presenting for evaluation of sudden onset of left-sided weakness with weakness last known well at 6 PM.  She was given TNK and emergently taken for a mechanical thrombectomy of her M2 MCA posterior division branch.  They achieved temporary recanalization with subsequent reocclusion.  Flat panel CT showed intense contrast blushing and edema of the right frontal and anterior temporal cortex and basal ganglia, suggesting ongoing ischemia . A small right Sylvian hyperdensity is also seen, suggesting contrast extravasation with likely associated SAH.  She returned to ICU intubated.  MRI showed right MCA large infarct.  MRA showed right M2 persistent occlusion.   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 ASPECTS is 8.  CTA head & neck Right MCA M2 segment occlusion with poor collateralization.  S/p IR with temporary recanalization with subsequent reocclusion (TICI 2A)  CT Head- Right perisylvian SAH/contrast has diffused but not clearly increased. Continue contrast blush extensively within right anterior frontal and temporal cortex and in the right basal ganglia ischemia, ACA and MCA territory is involved. Additional cytotoxic edema in the lateral and high right frontal cortex. MRI Brain- Large area of early subacute ischemia throughout the posterior right MCA territory. Mass effect on the right lateral ventricle with trace leftward midline shift. MRA Brain- No flow related enhancement seen in the right MCA M2 branches aside from the most anterior part of the superior division, unchanged from yesterday's CTA. 2D Echo EF 60-65%  LDL 174 HgbA1c 5.5 UDS cocaine positive VTE prophylaxis - heparin subq -> DC No antithrombotic prior to admission, was on ASA 325, now off given complicated measures.  Disposition: now on comfort care measures, anticipating hospital  death  Cerebral edema Hyponatremia Hypertonic saline initiated at 64m/hr- Now off  Sodium Q6 hr -> DC Na 152->156->159->160 On FW 200 Q8->Q6->Q4h->off Allow Na gradually trending down CT head 12/22 Progressive edema associated with a large right MCA territory infarct. Resulting increased mass effect with 6 mm of leftward midline shift. Petechial hemorrhage. Increased dilation of the left temporal horn, suspicious for ventricular entrapment. CT head repeat 12/25 showed slightly decreased MLS PICC line in place -> d/c for comfort  Hypertension Home meds: Amlodipine Stable on the low end Was on amlodipine 10 Off Cleviprex   Hyperlipidemia Home meds: Atorvastatin 20 mg LDL 174, goal < 70 Was on crestor 40  Acute Hypoxic respiratory failure  CCM on board Remains intubated post procedure Extubated for comfort care measures 12/26 Secretions with suctioning  Cocaine abuse Hx of cocaine use UDS positive for cocaine this time  Tobacco abuse Current smoker  Alcohol abuse Per daughter, she was drinking frequently Not compliant with medication with drinking Was on CIWA protocol   AKI  baseline Cr 1.5 Cr 2.79-> 3.49 -> 3.66 -> 3.80->3.69->3.55 Was on TF and FW  Fever and leukocytosis Tmax 101.5-> afebrile->101.8 WBC 11.8 -> 15 -> 14.6 -> 11.7->12.7->13.5->14.2 UA WBC 6-10 CXR 12/23 mild cardiomegaly and bilateral interstitial thickening with subtle intervening groundglass opacities suspected to be pulmonary edema Tylenol PRN  Other Stroke Risk Factors Obesity, Body mass index is 34.33 kg/m., BMI >/= 30 associated with increased stroke risk, recommend weight loss, diet and exercise as appropriate  Hx of stroke Congestive heart failure  Other Active Problems Depression Sertraline 50 mg -> off Hypokalemia Urine output increased -> concerning for DI Diarrhea - on rectal tube Fever - IV tylenol   Hospital day # 14   JRosalin Hawking MD PhD Stroke  Neurology 10/18/2022 11:08 AM   To contact Stroke Continuity provider, please refer to [email or web address] After hours, contact General Neurology

## 2022-10-18 NOTE — Plan of Care (Signed)
Problem: Education: Goal: Knowledge of disease or condition will improve Outcome: Progressing Goal: Knowledge of secondary prevention will improve (MUST DOCUMENT ALL) Outcome: Progressing Goal: Knowledge of patient specific risk factors will improve [place]ta [place] N/A or DELETE if not current risk factor) Outcome: Progressing   Problem: Ischemic Stroke/TIA Tissue Perfusion: Goal: Complications of ischemic stroke/TIA will be minimized Outcome: Progressing   Problem: Coping: Goal: Will verbalize positive feelings about self Outcome: Progressing Goal: Will identify appropriate support needs Outcome: Progressing   Problem: Health Behavior/Discharge Planning: Goal: Ability to manage health-related needs will improve Outcome: Progressing Goal: Goals will be collaboratively established with patient/family Outcome: Progressing   Problem: Self-Care: Goal: Ability to participate in self-care as condition permits will improve Outcome: Progressing Goal: Verbalization of feelings and concerns over difficulty with self-care will improve Outcome: Progressing Goal: Ability to communicate needs accurately will improve Outcome: Progressing   Problem: Nutrition: Goal: Risk of aspiration will decrease Outcome: Progressing Goal: Dietary intake will improve Outcome: Progressing   Problem: Education: Goal: Understanding of CV disease, CV risk reduction, and recovery process will improve Outcome: Progressing Goal: Individualized Educational Video(s) Outcome: Progressing   Problem: Activity: Goal: Ability to return to baseline activity level will improve Outcome: Progressing   Problem: Cardiovascular: Goal: Ability to achieve and maintain adequate cardiovascular perfusion will improve Outcome: Progressing Goal: Vascular access site(s) Level 0-1 will be maintained Outcome: Progressing   Problem: Health Behavior/Discharge Planning: Goal: Ability to safely manage health-related needs after  discharge will improve Outcome: Progressing   Problem: Education: Goal: Ability to describe self-care measures that may prevent or decrease complications (Diabetes Survival Skills Education) will improve Outcome: Progressing Goal: Individualized Educational Video(s) Outcome: Progressing   Problem: Coping: Goal: Ability to adjust to condition or change in health will improve Outcome: Progressing   Problem: Fluid Volume: Goal: Ability to maintain a balanced intake and output will improve Outcome: Progressing   Problem: Health Behavior/Discharge Planning: Goal: Ability to identify and utilize available resources and services will improve Outcome: Progressing Goal: Ability to manage health-related needs will improve Outcome: Progressing   Problem: Metabolic: Goal: Ability to maintain appropriate glucose levels will improve Outcome: Progressing   Problem: Nutritional: Goal: Maintenance of adequate nutrition will improve Outcome: Progressing Goal: Progress toward achieving an optimal weight will improve Outcome: Progressing   Problem: Skin Integrity: Goal: Risk for impaired skin integrity will decrease Outcome: Progressing   Problem: Tissue Perfusion: Goal: Adequacy of tissue perfusion will improve Outcome: Progressing   Problem: Education: Goal: Knowledge of General Education information will improve Description: Including pain rating scale, medication(s)/side effects and non-pharmacologic comfort measures Outcome: Progressing   Problem: Health Behavior/Discharge Planning: Goal: Ability to manage health-related needs will improve Outcome: Progressing   Problem: Clinical Measurements: Goal: Ability to maintain clinical measurements within normal limits will improve Outcome: Progressing Goal: Will remain free from infection Outcome: Progressing Goal: Diagnostic test results will improve Outcome: Progressing Goal: Respiratory complications will improve Outcome:  Progressing Goal: Cardiovascular complication will be avoided Outcome: Progressing   Problem: Activity: Goal: Risk for activity intolerance will decrease Outcome: Progressing   Problem: Nutrition: Goal: Adequate nutrition will be maintained Outcome: Progressing   Problem: Coping: Goal: Level of anxiety will decrease Outcome: Progressing   Problem: Elimination: Goal: Will not experience complications related to bowel motility Outcome: Progressing Goal: Will not experience complications related to urinary retention Outcome: Progressing   Problem: Pain Managment: Goal: General experience of comfort will improve Outcome: Progressing   Problem: Safety: Goal: Ability to remain free from injury  will improve Outcome: Progressing   Problem: Skin Integrity: Goal: Risk for impaired skin integrity will decrease Outcome: Progressing

## 2022-10-18 NOTE — Progress Notes (Signed)
Spoke with family and daughter and sister do not want the rectal tube remove due to continued Diarrhea and fear of moisture excoriation or irritation to mom's bottom. Daughter mentioned that she requested that while speaking to the doctor. Rectal tube remains per family request.

## 2022-10-18 NOTE — Progress Notes (Signed)
Patient bathed and lotion ed downed.

## 2022-10-22 NOTE — Death Summary Note (Signed)
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 Taylor Vazquez is a 57 y.o. female with history of CHF, hypertension, obesity, sickle cell trait, prior stroke, cocaine use, presenting for evaluation of sudden onset of left-sided weakness with weakness last known well at 6 PM.  She was given TNK and emergently taken for a mechanical thrombectomy of her M2 MCA posterior division branch.  They achieved temporary recanalization with subsequent reocclusion.  Flat panel CT showed intense contrast blushing and edema of the right frontal and anterior temporal cortex and basal ganglia, suggesting ongoing ischemia . A small right Sylvian hyperdensity is also seen, suggesting contrast extravasation with likely associated SAH.  She returned to ICU intubated.  MRI showed right MCA large infarct.  MRA showed right M2 persistent occlusion.    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 ASPECTS is 8.  CTA head & neck Right MCA M2 segment occlusion with poor collateralization. S/p IR with temporary  recanalization with subsequent reocclusion (TICI 2A)  CT Head- Right perisylvian SAH/contrast has diffused but not clearly increased. Continue contrast blush extensively within right anterior frontal and temporal cortex and in the right basal ganglia ischemia, ACA and MCA territory is involved. Additional cytotoxic edema in the lateral and high right frontal cortex. MRI Brain- Large area of early subacute ischemia throughout the posterior right MCA territory. Mass effect on the right lateral ventricle with trace leftward midline shift. MRA Brain- No flow related enhancement seen in the right MCA M2 branches aside from the most anterior part of the superior division, unchanged from yesterday's CTA. 2D Echo EF 60-65%  LDL 174 HgbA1c 5.5 UDS cocaine positive VTE prophylaxis - heparin subq -> DC No antithrombotic prior to admission, was on ASA 325, now off given complicated measures.  Disposition: comfort care measures per daughter and family. TOD 0537 11/05/2022   Cerebral edema Hyponatremia Hypertonic saline initiated at 80m/hr- Now off  Sodium Q6 hr -> DC Na 152->156->159->160 On FW 200 Q8->Q6->Q4h->off Allow Na gradually trending down CT head 12/22 Progressive edema associated with a large right MCA territory infarct. Resulting increased mass effect with 6 mm of leftward midline shift. Petechial hemorrhage. Increased dilation of the left temporal horn, suspicious for ventricular entrapment. CT head repeat 12/25 showed slightly decreased MLS PICC line in place -> d/c for comfort   Hypertension Home meds: Amlodipine Stable on the low end Was on amlodipine 10 Off Cleviprex    Hyperlipidemia Home meds: Atorvastatin 20 mg LDL 174, goal < 70 Was on crestor 40   Acute Hypoxic respiratory failure  CCM on board Remains intubated post procedure Extubated for comfort  care measures 12/26 Secretions with suctioning   Cocaine abuse Hx of cocaine use UDS positive for cocaine this time    Tobacco abuse Current smoker   Alcohol abuse Per daughter, she was drinking frequently Not compliant with medication with drinking Was on CIWA protocol    AKI  baseline Cr 1.5 Cr 2.79-> 3.49 -> 3.66 -> 3.80->3.69->3.55 Was on TF and FW   Fever and leukocytosis Tmax 101.5-> afebrile->101.8 WBC 11.8 -> 15 -> 14.6 -> 11.7->12.7->13.5->14.2 UA WBC 6-10 CXR 12/23 mild cardiomegaly and bilateral interstitial thickening with subtle intervening groundglass opacities suspected to be pulmonary edema Tylenol PRN   Other Stroke Risk Factors Obesity, Body mass index is 34.33 kg/m., BMI >/= 30 associated with increased stroke risk, recommend weight loss, diet and exercise as appropriate  Hx of stroke Congestive heart failure   Other Active Problems Depression Sertraline 50 mg -> off Hypokalemia Urine output increased -> concerning for DI Diarrhea - on rectal tube Fever - IV tylenol      Pertinent Labs and Studies  Significant Diagnostic Studies DG Abd Portable 1V  Result Date: 10/15/2022 CLINICAL DATA:  Enteric catheter placement EXAM: PORTABLE ABDOMEN - 1 VIEW COMPARISON:  10/05/2022 FINDINGS: Frontal view of the lower chest and upper abdomen demonstrates enteric catheter passing below diaphragm tip overlying the gastric body. Bowel gas pattern is unremarkable. There is patchy consolidation within the bilateral lower lobes. IMPRESSION: 1. Enteric catheter tip projecting over the gastric body. 2. Patchy bibasilar consolidation consistent with atelectasis or airspace disease. Electronically Signed   By: Randa Ngo M.D.   On: 10/15/2022 15:01   CT HEAD WO CONTRAST (5MM)  Result Date: 10/15/2022 CLINICAL DATA:  Stroke follow-up EXAM: CT HEAD WITHOUT CONTRAST TECHNIQUE: Contiguous axial images were obtained from the base of the skull through the vertex without intravenous contrast. RADIATION DOSE REDUCTION: This exam was performed according to the departmental dose-optimization  program which includes automated exposure control, adjustment of the mA and/or kV according to patient size and/or use of iterative reconstruction technique. COMPARISON:  CT Head 10/12/22 FINDINGS: Brain: Redemonstrated is a large right MCA territory infarct with curvilinear regions of hyperdensity in the infarcted territory, favored to represent petechial hemorrhage. There is no evidence of a parenchymal hematoma. Compared to prior exam there is interval decrease in mass effect with decreased leftward midline shift now measuring 5 mm, previously 7 mm. There is persistent effacement of the right ambient cistern, but decreased from prior exam. There is interval decrease in the size of the ventricular system compared to prior exam. No evidence of extra-axial blood products. No hydrocephalus. No new CT evidence of an infarct. Vascular: No hyperdense vessel or unexpected calcification. Skull: Normal. Negative for fracture or focal lesion. Sinuses/Orbits: Enteric tube in place. Frothy secretions in the right maxillary sinus and near-complete opacification of the left maxillary sinus. Air-fluid level is also seen in the bilateral sphenoid sinuses. These findings are nonspecific in the setting an enteric tube. There are bilateral mastoid effusions and middle ear effusions. Other: There is mild asymmetric soft tissue thickening along the inferior auricular soft tissues on the right (series 4, image 3). IMPRESSION: 1. Evolving large right MCA territory infarct with associated petechial hemorrhage. No evidence of a parenchymal hematoma. 2. Decreased mass effect with decreased leftward midline shift now measuring 5 mm, previously 7 mm. 3. Asymmetric soft tissue thickening in the inferior auricular soft tissues on the right. Findings could be seen in the setting of cellulitis. Correlate with physical exam. Electronically  Signed   By: Marin Roberts M.D.   On: 10/15/2022 14:06   DG CHEST PORT 1 VIEW  Result Date:  10/13/2022 CLINICAL DATA:  Leukocytosis. EXAM: PORTABLE CHEST 1 VIEW COMPARISON:  10/09/2022 and older exams.  CT, 12/11/2018. FINDINGS: Cardiac silhouette mildly enlarged.  No mediastinal or hilar masses. Bilateral interstitial thickening. Mild hazy intervening airspace opacities. Additional lung base opacities consistent with atelectasis. Possible small effusions. No pneumothorax. Endotracheal tube tip currently projects 6.5 cm above the carina. Nasal/orogastric tube passes well below the diaphragm and below the included field of view. Right PICC tip projects in the mid superior vena cava. IMPRESSION: 1. No change compared to the most recent prior exam. 2. Mild cardiomegaly and bilateral interstitial thickening with subtle intervening ground-glass opacities suspected to be pulmonary edema. 3. Endotracheal tube tip currently projects 6.5 cm above the carina. Electronically Signed   By: Lajean Manes M.D.   On: 10/13/2022 11:12   US RENAL  Result Date: 10/12/2022 CLINICAL DATA:  Follow-up right-sided hydronephrosis EXAM: RENAL / URINARY TRACT ULTRASOUND COMPLETE COMPARISON:  10/09/2022 FINDINGS: Right Kidney: Renal measurements: 11.5 x 5.2 x 6.1 cm. = volume: 190 mL. Previously seen hydronephrosis has resolved in the interval. Left Kidney: Renal measurements: 10.1 x 6.0 x 5.8 cm. = volume: 182 mL. Echogenicity within normal limits. No mass or hydronephrosis visualized. Bladder: Decompressed and not well visualized. Other: None. IMPRESSION: Resolution of previously seen right-sided hydronephrosis. This may have been related to a small distal stone which has passed. No other focal abnormality is noted. Electronically Signed   By: Inez Catalina M.D.   On: 10/12/2022 18:16   CT HEAD WO CONTRAST (5MM)  Result Date: 10/12/2022 CLINICAL DATA:  Neuro deficit, acute, stroke suspected EXAM: CT HEAD WITHOUT CONTRAST TECHNIQUE: Contiguous axial images were obtained from the base of the skull through the vertex without  intravenous contrast. RADIATION DOSE REDUCTION: This exam was performed according to the departmental dose-optimization program which includes automated exposure control, adjustment of the mA and/or kV according to patient size and/or use of iterative reconstruction technique. COMPARISON:  CT head 10/07/2022. FINDINGS: Brain: Progressive edema associated with a large right MCA territory infarct. Resulting increased mass effect with 6 mm of leftward midline shift and effacement the right lateral ventricle. Petechial hemorrhage. Increased dilation of the left temporal horn, suspicious for ventricular entrapment. No evidence of mass lesion. Vascular: None hyperdense right MCA vessels, possibly thrombosed. Skull: No acute fracture. Sinuses/Orbits: Paranasal sinus mucosal thickening. No acute orbital findings. Other: No mastoid effusions. IMPRESSION: 1. Progressive edema associated with a large right MCA territory infarct. Resulting increased mass effect with 6 mm of leftward midline shift. Petechial hemorrhage. 2. Increased dilation of the left temporal horn, suspicious for ventricular entrapment. These results will be called to the ordering clinician or representative by the Radiologist Assistant, and communication documented in the PACS or [employer]. Electronically Signed   By: Margaretha Sheffield M.D.   On: 10/12/2022 14:26   US RENAL  Result Date: 10/09/2022 CLINICAL DATA:  Acute kidney injured EXAM: RENAL / URINARY TRACT ULTRASOUND COMPLETE COMPARISON:  CT 08/15/2021 FINDINGS: Right Kidney: Renal measurements: 11.5 x 5.7 x 5.7 cm = volume: 194.3 mL. Moderate hydronephrosis with dilated proximal ureter. The mid to distal ureter is not visualized. Left Kidney: Renal measurements: 8.2 x 5.4 x 4.9 cm = volume: 113.3 mL. Mild left-sided pelviectasis. Bladder: Distended urinary bladder.  Ureteral jets not visualized. Other: None. IMPRESSION: Moderate right-sided hydronephrosis with dilated proximal ureter. Mild  left-sided pelviectasis. Distended urinary bladder. Electronically Signed   By: Maurine Simmering M.D.   On: 10/09/2022 10:03   DG CHEST PORT 1 VIEW  Result Date: 10/09/2022 CLINICAL DATA:  57 year old female with right MCA infarct. EXAM: PORTABLE CHEST 1 VIEW COMPARISON:  Portable chest 10/07/2022 and earlier. FINDINGS: Portable AP semi upright view at 0524 hours. Extubated. Enteric feeding tube remains in place. Lower lung volumes. Stable cardiomegaly and mediastinal contours. Bilateral reticulonodular pulmonary opacity, not significantly changed although now relatively sparing the left lung apex. No pneumothorax. No pleural effusion is evident. Paucity bowel gas in the visible abdomen. No acute osseous abnormality identified. IMPRESSION: 1. Extubated. Enteric feeding tube remains in place. 2. Lower lung volumes with continued bilateral reticulonodular opacity. Favor pulmonary edema over infection or aspiration. No pleural effusion is evident. Electronically Signed   By: Genevie Ann M.D.   On: 10/09/2022 08:47   DG CHEST PORT 1 VIEW  Result Date: 10/07/2022 CLINICAL DATA:  57 year old female with history of respiratory distress. EXAM: PORTABLE CHEST 1 VIEW COMPARISON:  Chest x-ray 10/05/2022. FINDINGS: An endotracheal tube is in place with tip 4.4 cm above the carina. A feeding tube is seen extending into the abdomen, however, the tip of the feeding tube extends below the lower margin of the image. There is a right upper extremity PICC with tip terminating in the superior cavoatrial junction. Lung volumes are slightly low. There is cephalization of the pulmonary vasculature and slight indistinctness of the interstitial markings suggestive of mild pulmonary edema. Small left pleural effusion. No right pleural effusion. Resolving atelectasis and/or consolidation in the left lung base. No pneumothorax. Heart size is mildly enlarged. Upper mediastinal contours are within normal limits. IMPRESSION: 1. Support  apparatus, as above. 2. The appearance of the chest is most suggestive of congestive heart failure. 3. Small left pleural effusion, slightly decreased compared to the prior study, with resolving areas of atelectasis and/or consolidation in the left lung base. Electronically Signed   By: Vinnie Langton M.D.   On: 10/07/2022 08:35   CT HEAD WO CONTRAST (5MM)  Result Date: 10/07/2022 CLINICAL DATA:  Stroke follow-up EXAM: CT HEAD WITHOUT CONTRAST TECHNIQUE: Contiguous axial images were obtained from the base of the skull through the vertex without intravenous contrast. RADIATION DOSE REDUCTION: This exam was performed according to the departmental dose-optimization program which includes automated exposure control, adjustment of the mA and/or kV according to patient size and/or use of iterative reconstruction technique. COMPARISON:  10/05/2022 FINDINGS: Brain: Worsened cytotoxic edema throughout the posterior right MCA territory. Continued diffusion of hyperdense subarachnoid material, which may be blood or contrast agent. Parenchymal staining of the left lentiform nucleus is unchanged. There is no new site of hemorrhage. There is 3 mm of leftward midline shift. No hydrocephalus. Vascular: No hyperdense vessel or unexpected calcification. Skull: Normal. Negative for fracture or focal lesion. Sinuses/Orbits: Left maxillary sinus opacification. Other: None. IMPRESSION: 1. Worsened cytotoxic edema throughout the posterior right MCA territory with 3 mm of leftward midline shift. 2. Continued diffusion of hyperdense subarachnoid material, which may be blood or contrast agent. Electronically Signed   By: Ulyses Jarred M.D.   On: 10/07/2022 00:40   MR BRAIN WO CONTRAST  Result Date: 10/05/2022 CLINICAL DATA:  Stroke follow-up EXAM: MRI HEAD WITHOUT CONTRAST MRA HEAD WITHOUT CONTRAST TECHNIQUE: Multiplanar, multi-echo pulse sequences of the brain and surrounding structures were acquired without intravenous  contrast. Angiographic images of the Circle of Willis were acquired using MRA technique without intravenous contrast.  COMPARISON:  None Available. FINDINGS: MRI HEAD FINDINGS Brain: Large area early subacute ischemia throughout the posterior right MCA territory. There is mass effect on the right lateral ventricle with trace leftward midline shift. No acute or chronic hemorrhage. Normal white matter signal, parenchymal volume and CSF spaces. The midline structures are normal. Vascular: Major flow voids are preserved. Skull and upper cervical spine: Normal calvarium and skull base. Visualized upper cervical spine and soft tissues are normal. Sinuses/Orbits:No paranasal sinus fluid levels or advanced mucosal thickening. No mastoid or middle ear effusion. Normal orbits. MRA HEAD FINDINGS POSTERIOR CIRCULATION: --Vertebral arteries: Normal --Inferior cerebellar arteries: Normal. --Basilar artery: Normal. --Superior cerebellar arteries: Normal. --Posterior cerebral arteries: Normal. ANTERIOR CIRCULATION: --Intracranial internal carotid arteries: Normal. --Anterior cerebral arteries (ACA): Normal. --Middle cerebral arteries (MCA): There is no flow related enhancement seen in the right MCA M2 branches aside from the most anterior part of the superior division, unchanged from yesterday's CTA. Left MCA normal. ANATOMIC VARIANTS: None IMPRESSION: 1. Large area of early subacute ischemia throughout the posterior right MCA territory. Mass effect on the right lateral ventricle with trace leftward midline shift. 2. No flow related enhancement seen in the right MCA M2 branches aside from the most anterior part of the superior division, unchanged from yesterday's CTA. Electronically Signed   By: Ulyses Jarred M.D.   On: 10/05/2022 21:31   MR ANGIO HEAD WO CONTRAST  Result Date: 10/05/2022 CLINICAL DATA:  Stroke follow-up EXAM: MRI HEAD WITHOUT CONTRAST MRA HEAD WITHOUT CONTRAST TECHNIQUE: Multiplanar, multi-echo pulse sequences  of the brain and surrounding structures were acquired without intravenous contrast. Angiographic images of the Circle of Willis were acquired using MRA technique without intravenous contrast. COMPARISON:  None Available. FINDINGS: MRI HEAD FINDINGS Brain: Large area early subacute ischemia throughout the posterior right MCA territory. There is mass effect on the right lateral ventricle with trace leftward midline shift. No acute or chronic hemorrhage. Normal white matter signal, parenchymal volume and CSF spaces. The midline structures are normal. Vascular: Major flow voids are preserved. Skull and upper cervical spine: Normal calvarium and skull base. Visualized upper cervical spine and soft tissues are normal. Sinuses/Orbits:No paranasal sinus fluid levels or advanced mucosal thickening. No mastoid or middle ear effusion. Normal orbits. MRA HEAD FINDINGS POSTERIOR CIRCULATION: --Vertebral arteries: Normal --Inferior cerebellar arteries: Normal. --Basilar artery: Normal. --Superior cerebellar arteries: Normal. --Posterior cerebral arteries: Normal. ANTERIOR CIRCULATION: --Intracranial internal carotid arteries: Normal. --Anterior cerebral arteries (ACA): Normal. --Middle cerebral arteries (MCA): There is no flow related enhancement seen in the right MCA M2 branches aside from the most anterior part of the superior division, unchanged from yesterday's CTA. Left MCA normal. ANATOMIC VARIANTS: None IMPRESSION: 1. Large area of early subacute ischemia throughout the posterior right MCA territory. Mass effect on the right lateral ventricle with trace leftward midline shift. 2. No flow related enhancement seen in the right MCA M2 branches aside from the most anterior part of the superior division, unchanged from yesterday's CTA. Electronically Signed   By: Ulyses Jarred M.D.   On: 10/05/2022 21:31   IR PERCUTANEOUS ART THROMBECTOMY/INFUSION INTRACRANIAL INC DIAG ANGIO  Result Date: 10/05/2022 INDICATION: Taylor Vazquez is a 57 year-old female with sudden onset of left-sided weakness with weakness, NIHSS 25. Her last known well was 18:00 on 10/09/2022. Her past medical history is significant for CHF, hypertension, obesity, sickle cell trait, prior stroke, cocaine and alcohol use. Head CT showed large acute territory infarct or hemorrhage. Patient received IV TNK. CT angiogram of  the head and neck showed occlusion of the proximal right M2/MCA dominant posterior division branch with poor collaterals. She was then transferred to our service for mechanical thrombectomy. EXAM: ULTRASOUND-GUIDED VASCULAR ACCESS DIAGNOSTIC CEREBRAL ANGIOGRAM THROMBECTOMY FLAT PANEL HEAD CT COMPARISON:  CT/CT angiogram of neck October 04, 2022. MEDICATIONS: No antibiotic was administered. ANESTHESIA/SEDATION: The procedure was performed under general anesthesia. CONTRAST:  75 mL of Omnipaque 300 mg/mL. FLUOROSCOPY: Radiation Exposure Index (as provided by the fluoroscopic device): 235 mGy Kerma COMPLICATIONS: None immediate. TECHNIQUE: Informed written consent was obtained from the patient after a thorough discussion of the procedural risks, benefits and alternatives. All questions were addressed. Maximal Sterile Barrier Technique was utilized including caps, mask, sterile gowns, sterile gloves, sterile drape, hand hygiene and skin antiseptic. A timeout was performed prior to the initiation of the procedure. The right groin was prepped and draped in the usual sterile fashion. Using a micropuncture kit and the modified Seldinger technique, access was gained to the right common femoral artery and an 8 French sheath was placed. Real-time ultrasound guidance was utilized for vascular access including the acquisition of a permanent ultrasound image documenting patency of the accessed vessel. Under fluoroscopy, a Zoom 88 guide catheter was navigated over a 6 [place] Berenstein 2 catheter and a 0.035" Terumo Glidewire into the aortic arch. The catheter  was placed into the right common carotid artery and then advanced into the right internal carotid artery. The diagnostic catheter was removed. Frontal and lateral angiograms of the head were obtained. FINDINGS: 1. Normal caliber of the right common femoral artery, adequate vascular access. 2. Occlusion of a proximal dominant a right M2/MCA posterior division branch. Luminal irregularity in the mid right M1/MCA which could represent a dissection versus plaque rupture. 3. Luminal irregularity along the right carotid siphons and right ACA vascular tree, consistent with intracranial atherosclerotic disease without hemodynamically significant stenosis. PROCEDURE: Using biplane roadmap guidance, a Freeclimb 70 aspiration catheter was navigated over a tenzing delivery catheter into the M1 segment of the right MCA. The aspiration catheter was then advanced to the level of occlusion and connected to an aspiration pump. Continuous aspiration was performed for 2 minutes. The guide catheter was connected to a VacLok syringe. The aspiration catheter was subsequently removed under constant aspiration. The guide catheter was aspirated for debris. Right internal carotid artery angiograms with frontal lateral views of the head showed persistent occlusion of the proximal right M2/MCA posterior division branch. Tapering of the occluded vessel noted. Using biplane roadmap guidance, a Freeclimb 70 aspiration catheter was navigated over a phenom 21 microcatheter and an Aristotle 14 microguidewire into the cavernous segment of the right ICA. The microcatheter was then navigated over the wire into the right M2/MCA posterior division branch. Then, a 4 x 40 mm solitaire stent retriever was deployed spanning the . The device was allowed to intercalated with the clot for 4 minutes. The microcatheter was removed. The aspiration catheter was advanced to the level of occlusion and connected to an aspiration pump. The thrombectomy device and  aspiration catheter were removed under constant aspiration. The guide catheter was aspirated for debris. Right internal carotid artery angiograms with frontal lateral views of the head showed persistent occlusion of the proximal right M2/MCA posterior division branch. Using biplane roadmap guidance, a Zoom 35 aspiration catheter was navigated over an Aristotle 24 microguidewire into the cavernous segment of the right ICA. The aspiration catheter was then advanced to the level of occlusion and connected to an aspiration pump. Continuous aspiration was  performed for 2 minutes. The guide catheter was connected to a VacLok syringe. The aspiration catheter was subsequently removed under constant aspiration. The guide catheter was aspirated for debris. Right internal carotid artery angiograms with frontal lateral views of the head showed recanalization of the right M2/MCA branch. However, there was severe stenosis and irregularity proximally, suggesting dissection, with slow distal flow. Delayed angiograms showed reocclusion of the M2 segment. Flat panel CT of the head was obtained and post processed in a separate workstation with concurrent attending physician supervision. Selected images were sent to PACS. Prominent swelling and contrast enhancement of the frontal cortex, tear temporal cortex, basal ganglia with a 2.5 x 2.5 cm hyperdense area within the deep white matter of the right frontal lobe. The catheter was subsequently withdrawn. Right common femoral artery angiogram was obtained in right anterior oblique view. The puncture is at the level of the common femoral artery. The artery has normal caliber, adequate for closure device. The sheath was exchanged over the wire for a Perclose prostyle which was utilized for access closure. Immediate hemostasis was achieved. Small right sylvian subarachnoid hemorrhage versus contrast extravasation with also noted. IMPRESSION: 1. Mechanical thrombectomy performed for  occlusion of a proximal dominant right M2/MCA posterior division branch. A total of 3 passes were performed with temporary recanalization and subsequent reocclusion. Final recanalization status is TICI 2A. Underlying dissection noted. 2. Rescue stenting not performed due to need for anti-platelet therapy in the setting of TNK administration and flat panel CT showing small contrast extravasation/subarachnoid hemorrhage and prominent blushing and edema of the frontotemporal cortex and basal ganglia as well as a 2.5 cm area of hyperdensity within the right frontal lobe concerning for ongoing ischemia versus hemorrhage. PLAN: Refer to EMR. Electronically Signed   By: Pedro Earls M.D.   On: 10/05/2022 14:39   IR US Guide Vasc Access Right  Result Date: 10/05/2022 INDICATION: Taylor Vazquez is a 57 year-old female with sudden onset of left-sided weakness with weakness, NIHSS 25. Her last known well was 18:00 on 10/12/2022. Her past medical history is significant for CHF, hypertension, obesity, sickle cell trait, prior stroke, cocaine and alcohol use. Head CT showed large acute territory infarct or hemorrhage. Patient received IV TNK. CT angiogram of the head and neck showed occlusion of the proximal right M2/MCA dominant posterior division branch with poor collaterals. She was then transferred to our service for mechanical thrombectomy. EXAM: ULTRASOUND-GUIDED VASCULAR ACCESS DIAGNOSTIC CEREBRAL ANGIOGRAM THROMBECTOMY FLAT PANEL HEAD CT COMPARISON:  CT/CT angiogram of neck October 04, 2022. MEDICATIONS: No antibiotic was administered. ANESTHESIA/SEDATION: The procedure was performed under general anesthesia. CONTRAST:  75 mL of Omnipaque 300 mg/mL. FLUOROSCOPY: Radiation Exposure Index (as provided by the fluoroscopic device): 270 mGy Kerma COMPLICATIONS: None immediate. TECHNIQUE: Informed written consent was obtained from the patient after a thorough discussion of the procedural risks,  benefits and alternatives. All questions were addressed. Maximal Sterile Barrier Technique was utilized including caps, mask, sterile gowns, sterile gloves, sterile drape, hand hygiene and skin antiseptic. A timeout was performed prior to the initiation of the procedure. The right groin was prepped and draped in the usual sterile fashion. Using a micropuncture kit and the modified Seldinger technique, access was gained to the right common femoral artery and an 8 French sheath was placed. Real-time ultrasound guidance was utilized for vascular access including the acquisition of a permanent ultrasound image documenting patency of the accessed vessel. Under fluoroscopy, a Zoom 88 guide catheter was navigated over a  6 [place] Berenstein 2 catheter and a 0.035" Terumo Glidewire into the aortic arch. The catheter was placed into the right common carotid artery and then advanced into the right internal carotid artery. The diagnostic catheter was removed. Frontal and lateral angiograms of the head were obtained. FINDINGS: 1. Normal caliber of the right common femoral artery, adequate vascular access. 2. Occlusion of a proximal dominant a right M2/MCA posterior division branch. Luminal irregularity in the mid right M1/MCA which could represent a dissection versus plaque rupture. 3. Luminal irregularity along the right carotid siphons and right ACA vascular tree, consistent with intracranial atherosclerotic disease without hemodynamically significant stenosis. PROCEDURE: Using biplane roadmap guidance, a Freeclimb 70 aspiration catheter was navigated over a tenzing delivery catheter into the M1 segment of the right MCA. The aspiration catheter was then advanced to the level of occlusion and connected to an aspiration pump. Continuous aspiration was performed for 2 minutes. The guide catheter was connected to a VacLok syringe. The aspiration catheter was subsequently removed under constant aspiration. The guide catheter was  aspirated for debris. Right internal carotid artery angiograms with frontal lateral views of the head showed persistent occlusion of the proximal right M2/MCA posterior division branch. Tapering of the occluded vessel noted. Using biplane roadmap guidance, a Freeclimb 70 aspiration catheter was navigated over a phenom 21 microcatheter and an Aristotle 14 microguidewire into the cavernous segment of the right ICA. The microcatheter was then navigated over the wire into the right M2/MCA posterior division branch. Then, a 4 x 40 mm solitaire stent retriever was deployed spanning the . The device was allowed to intercalated with the clot for 4 minutes. The microcatheter was removed. The aspiration catheter was advanced to the level of occlusion and connected to an aspiration pump. The thrombectomy device and aspiration catheter were removed under constant aspiration. The guide catheter was aspirated for debris. Right internal carotid artery angiograms with frontal lateral views of the head showed persistent occlusion of the proximal right M2/MCA posterior division branch. Using biplane roadmap guidance, a Zoom 35 aspiration catheter was navigated over an Aristotle 24 microguidewire into the cavernous segment of the right ICA. The aspiration catheter was then advanced to the level of occlusion and connected to an aspiration pump. Continuous aspiration was performed for 2 minutes. The guide catheter was connected to a VacLok syringe. The aspiration catheter was subsequently removed under constant aspiration. The guide catheter was aspirated for debris. Right internal carotid artery angiograms with frontal lateral views of the head showed recanalization of the right M2/MCA branch. However, there was severe stenosis and irregularity proximally, suggesting dissection, with slow distal flow. Delayed angiograms showed reocclusion of the M2 segment. Flat panel CT of the head was obtained and post processed in a separate  workstation with concurrent attending physician supervision. Selected images were sent to PACS. Prominent swelling and contrast enhancement of the frontal cortex, tear temporal cortex, basal ganglia with a 2.5 x 2.5 cm hyperdense area within the deep white matter of the right frontal lobe. The catheter was subsequently withdrawn. Right common femoral artery angiogram was obtained in right anterior oblique view. The puncture is at the level of the common femoral artery. The artery has normal caliber, adequate for closure device. The sheath was exchanged over the wire for a Perclose prostyle which was utilized for access closure. Immediate hemostasis was achieved. Small right sylvian subarachnoid hemorrhage versus contrast extravasation with also noted. IMPRESSION: 1. Mechanical thrombectomy performed for occlusion of a proximal dominant right M2/MCA posterior  division branch. A total of 3 passes were performed with temporary recanalization and subsequent reocclusion. Final recanalization status is TICI 2A. Underlying dissection noted. 2. Rescue stenting not performed due to need for anti-platelet therapy in the setting of TNK administration and flat panel CT showing small contrast extravasation/subarachnoid hemorrhage and prominent blushing and edema of the frontotemporal cortex and basal ganglia as well as a 2.5 cm area of hyperdensity within the right frontal lobe concerning for ongoing ischemia versus hemorrhage. PLAN: Refer to EMR. Electronically Signed   By: Pedro Earls M.D.   On: 10/05/2022 14:39   IR CT Head Ltd  Result Date: 10/05/2022 INDICATION: Taylor Vazquez is a 57 year-old female with sudden onset of left-sided weakness with weakness, NIHSS 25. Her last known well was 18:00 on 10/13/2022. Her past medical history is significant for CHF, hypertension, obesity, sickle cell trait, prior stroke, cocaine and alcohol use. Head CT showed large acute territory infarct or hemorrhage.  Patient received IV TNK. CT angiogram of the head and neck showed occlusion of the proximal right M2/MCA dominant posterior division branch with poor collaterals. She was then transferred to our service for mechanical thrombectomy. EXAM: ULTRASOUND-GUIDED VASCULAR ACCESS DIAGNOSTIC CEREBRAL ANGIOGRAM THROMBECTOMY FLAT PANEL HEAD CT COMPARISON:  CT/CT angiogram of neck October 04, 2022. MEDICATIONS: No antibiotic was administered. ANESTHESIA/SEDATION: The procedure was performed under general anesthesia. CONTRAST:  75 mL of Omnipaque 300 mg/mL. FLUOROSCOPY: Radiation Exposure Index (as provided by the fluoroscopic device): 170 mGy Kerma COMPLICATIONS: None immediate. TECHNIQUE: Informed written consent was obtained from the patient after a thorough discussion of the procedural risks, benefits and alternatives. All questions were addressed. Maximal Sterile Barrier Technique was utilized including caps, mask, sterile gowns, sterile gloves, sterile drape, hand hygiene and skin antiseptic. A timeout was performed prior to the initiation of the procedure. The right groin was prepped and draped in the usual sterile fashion. Using a micropuncture kit and the modified Seldinger technique, access was gained to the right common femoral artery and an 8 French sheath was placed. Real-time ultrasound guidance was utilized for vascular access including the acquisition of a permanent ultrasound image documenting patency of the accessed vessel. Under fluoroscopy, a Zoom 88 guide catheter was navigated over a 6 [place] Berenstein 2 catheter and a 0.035" Terumo Glidewire into the aortic arch. The catheter was placed into the right common carotid artery and then advanced into the right internal carotid artery. The diagnostic catheter was removed. Frontal and lateral angiograms of the head were obtained. FINDINGS: 1. Normal caliber of the right common femoral artery, adequate vascular access. 2. Occlusion of a proximal dominant a right  M2/MCA posterior division branch. Luminal irregularity in the mid right M1/MCA which could represent a dissection versus plaque rupture. 3. Luminal irregularity along the right carotid siphons and right ACA vascular tree, consistent with intracranial atherosclerotic disease without hemodynamically significant stenosis. PROCEDURE: Using biplane roadmap guidance, a Freeclimb 70 aspiration catheter was navigated over a tenzing delivery catheter into the M1 segment of the right MCA. The aspiration catheter was then advanced to the level of occlusion and connected to an aspiration pump. Continuous aspiration was performed for 2 minutes. The guide catheter was connected to a VacLok syringe. The aspiration catheter was subsequently removed under constant aspiration. The guide catheter was aspirated for debris. Right internal carotid artery angiograms with frontal lateral views of the head showed persistent occlusion of the proximal right M2/MCA posterior division branch. Tapering of the occluded vessel noted. Using  biplane roadmap guidance, a Freeclimb 70 aspiration catheter was navigated over a phenom 21 microcatheter and an Aristotle 14 microguidewire into the cavernous segment of the right ICA. The microcatheter was then navigated over the wire into the right M2/MCA posterior division branch. Then, a 4 x 40 mm solitaire stent retriever was deployed spanning the . The device was allowed to intercalated with the clot for 4 minutes. The microcatheter was removed. The aspiration catheter was advanced to the level of occlusion and connected to an aspiration pump. The thrombectomy device and aspiration catheter were removed under constant aspiration. The guide catheter was aspirated for debris. Right internal carotid artery angiograms with frontal lateral views of the head showed persistent occlusion of the proximal right M2/MCA posterior division branch. Using biplane roadmap guidance, a Zoom 35 aspiration catheter was  navigated over an Aristotle 24 microguidewire into the cavernous segment of the right ICA. The aspiration catheter was then advanced to the level of occlusion and connected to an aspiration pump. Continuous aspiration was performed for 2 minutes. The guide catheter was connected to a VacLok syringe. The aspiration catheter was subsequently removed under constant aspiration. The guide catheter was aspirated for debris. Right internal carotid artery angiograms with frontal lateral views of the head showed recanalization of the right M2/MCA branch. However, there was severe stenosis and irregularity proximally, suggesting dissection, with slow distal flow. Delayed angiograms showed reocclusion of the M2 segment. Flat panel CT of the head was obtained and post processed in a separate workstation with concurrent attending physician supervision. Selected images were sent to PACS. Prominent swelling and contrast enhancement of the frontal cortex, tear temporal cortex, basal ganglia with a 2.5 x 2.5 cm hyperdense area within the deep white matter of the right frontal lobe. The catheter was subsequently withdrawn. Right common femoral artery angiogram was obtained in right anterior oblique view. The puncture is at the level of the common femoral artery. The artery has normal caliber, adequate for closure device. The sheath was exchanged over the wire for a Perclose prostyle which was utilized for access closure. Immediate hemostasis was achieved. Small right sylvian subarachnoid hemorrhage versus contrast extravasation with also noted. IMPRESSION: 1. Mechanical thrombectomy performed for occlusion of a proximal dominant right M2/MCA posterior division branch. A total of 3 passes were performed with temporary recanalization and subsequent reocclusion. Final recanalization status is TICI 2A. Underlying dissection noted. 2. Rescue stenting not performed due to need for anti-platelet therapy in the setting of TNK administration  and flat panel CT showing small contrast extravasation/subarachnoid hemorrhage and prominent blushing and edema of the frontotemporal cortex and basal ganglia as well as a 2.5 cm area of hyperdensity within the right frontal lobe concerning for ongoing ischemia versus hemorrhage. PLAN: Refer to EMR. Electronically Signed   By: Pedro Earls M.D.   On: 10/05/2022 14:39   ECHOCARDIOGRAM COMPLETE  Result Date: 10/05/2022    ECHOCARDIOGRAM REPORT   Patient Name:   Taylor Vazquez [facility] Date of Exam: 10/05/2022 Medical Rec #:  [id number]          Height:       63.0 in Accession #:    [id number]         Weight:       226.2 lb Date of Birth:  Aug 23, 1966          BSA:          2.037 m Patient Age:    14 years  BP:           148/80 mmHg Patient Gender: F                  HR:           95 bpm. Exam Location:  Inpatient Procedure: 2D Echo, Color Doppler and Cardiac Doppler Indications:    Stroke  History:        Patient has prior history of Echocardiogram examinations, most                 recent 06/25/2017. CHF, Stroke; Risk Factors:Hypertension and                 Obesity. Cocaine and ETOH, sickle cell.  Sonographer:    Eartha Inch Referring Phys: [id number] ASHISH ARORA  Sonographer Comments: Echo performed with patient supine and on artificial respirator. Image acquisition challenging due to patient body habitus and Image acquisition challenging due to respiratory motion. IMPRESSIONS  1. Left ventricular ejection fraction, by estimation, is 60 to 65%. The left ventricle has normal function. The left ventricle has no regional wall motion abnormalities. There is moderate left ventricular hypertrophy. Left ventricular diastolic parameters are consistent with Grade II diastolic dysfunction (pseudonormalization). Elevated left atrial pressure.  2. Right ventricular systolic function is normal. The right ventricular size is normal. Tricuspid regurgitation signal is inadequate for assessing PA pressure.   3. Left atrial size was mildly dilated.  4. The mitral valve is normal in structure. No evidence of mitral valve regurgitation. No evidence of mitral stenosis.  5. The aortic valve is tricuspid. Aortic valve regurgitation is not visualized. No aortic stenosis is present.  6. The inferior vena cava is normal in size with <50% respiratory variability, suggesting right atrial pressure of 8 mmHg. FINDINGS  Left Ventricle: Left ventricular ejection fraction, by estimation, is 60 to 65%. The left ventricle has normal function. The left ventricle has no regional wall motion abnormalities. The left ventricular internal cavity size was normal in size. There is  moderate left ventricular hypertrophy. Left ventricular diastolic parameters are consistent with Grade II diastolic dysfunction (pseudonormalization). Elevated left atrial pressure. Right Ventricle: The right ventricular size is normal. No increase in right ventricular wall thickness. Right ventricular systolic function is normal. Tricuspid regurgitation signal is inadequate for assessing PA pressure. Left Atrium: Left atrial size was mildly dilated. Right Atrium: Right atrial size was normal in size. Pericardium: Trivial pericardial effusion is present. Mitral Valve: The mitral valve is normal in structure. No evidence of mitral valve regurgitation. No evidence of mitral valve stenosis. Tricuspid Valve: The tricuspid valve is normal in structure. Tricuspid valve regurgitation is trivial. Aortic Valve: The aortic valve is tricuspid. Aortic valve regurgitation is not visualized. No aortic stenosis is present. Pulmonic Valve: The pulmonic valve was not well visualized. Pulmonic valve regurgitation is not visualized. Aorta: The aortic root is normal in size and structure. Venous: The inferior vena cava is normal in size with less than 50% respiratory variability, suggesting right atrial pressure of 8 mmHg. IAS/Shunts: The interatrial septum was not well visualized.  LEFT  VENTRICLE PLAX 2D LVIDd:         4.60 cm   Diastology LVIDs:         2.80 cm   LV e' medial:    5.00 cm/s LV PW:         1.20 cm   LV E/e' medial:  15.3 LV IVS:        1.30 cm  LV e' lateral:   5.22 cm/s LVOT diam:     2.30 cm   LV E/e' lateral: 14.7 LV SV:         103 LV SV Index:   51 LVOT Area:     4.15 cm  RIGHT VENTRICLE             IVC RV S prime:     15.70 cm/s  IVC diam: 2.00 cm TAPSE (M-mode): 2.1 cm LEFT ATRIUM             Index        RIGHT ATRIUM           Index LA diam:        3.80 cm 1.87 cm/m   RA Area:     11.10 cm LA Vol (A2C):   57.1 ml 28.02 ml/m  RA Volume:   21.50 ml  10.55 ml/m LA Vol (A4C):   77.6 ml 38.09 ml/m LA Biplane Vol: 68.8 ml 33.77 ml/m  AORTIC VALVE LVOT Vmax:   123.00 cm/s LVOT Vmean:  77.700 cm/s LVOT VTI:    0.249 m  AORTA Ao Root diam: 3.30 cm MITRAL VALVE MV Area (PHT): 3.65 cm    SHUNTS MV Decel Time: 208 msec    Systemic VTI:  0.25 m MV E velocity: 76.70 cm/s  Systemic Diam: 2.30 cm MV A velocity: 89.40 cm/s MV E/A ratio:  0.86 Oswaldo Milian MD Electronically signed by Oswaldo Milian MD Signature Date/Time: 10/05/2022/12:08:42 PM    Final    [place] EKG SITE RITE  Result Date: 10/05/2022 If Site Rite image not attached, placement could not be confirmed due to current cardiac rhythm.  DG Abd Portable 1V  Result Date: 10/05/2022 CLINICAL DATA:  Feeding tube placement. EXAM: PORTABLE ABDOMEN - 1 VIEW COMPARISON:  None Available. FINDINGS: Distal tip of feeding tube is seen in expected position of distal stomach. No abnormal bowel dilatation is noted. IMPRESSION: Distal tip of feeding tube is seen in expected position of distal stomach. Electronically Signed   By: Marijo Conception M.D.   On: 10/05/2022 10:55   CT HEAD WO CONTRAST (5MM)  Result Date: 10/05/2022 CLINICAL DATA:  Follow-up ICH EXAM: CT HEAD WITHOUT CONTRAST TECHNIQUE: Contiguous axial images were obtained from the base of the skull through the vertex without intravenous contrast.  RADIATION DOSE REDUCTION: This exam was performed according to the departmental dose-optimization program which includes automated exposure control, adjustment of the mA and/or kV according to patient size and/or use of iterative reconstruction technique. COMPARISON:  Flat panel CT from yesterday FINDINGS: Brain: Continued increased hazy density within the cortex of the anterior right frontal and anterior right temporal cortex, including the parasagittal frontal region and extending into the basal ganglia. Linear high-density within the right sylvian fissure, likely combination of subarachnoid blood and contrast, similar in volume although likely somewhat diffused, [place] 3C. Extensive cytotoxic edema in the right frontal lobe. Local mass effect without significant midline shift and no entrapment. No focal hematoma. Vascular: Stable Skull: Unremarkable Sinuses/Orbits: Unremarkable IMPRESSION: 1. Right perisylvian subarachnoid hemorrhage/contrast has diffused but not clearly increased. 2. Continue contrast blush extensively within right anterior frontal and temporal cortex and in the right basal ganglia ischemia, ACA and MCA territory is involved. Additional cytotoxic edema in the lateral and high right frontal cortex. Electronically Signed   By: Jorje Guild M.D.   On: 10/05/2022 04:18   DG Chest Port 1 View  Result Date: 10/05/2022 CLINICAL DATA:  Check endotracheal tube placement EXAM: PORTABLE CHEST 1 VIEW COMPARISON:  Film from earlier in the same day. FINDINGS: Endotracheal tube is now seen 17 mm above the carina in more stable position. Persistent vascular congestion is seen. Left retrocardiac density as well as a fusion is seen. IMPRESSION: Improved positioning of the endotracheal tube. Stable vascular congestion and left effusion. Electronically Signed   By: Inez Catalina M.D.   On: 10/05/2022 02:14   DG CHEST PORT 1 VIEW  Result Date: 10/05/2022 CLINICAL DATA:  Check endotracheal tube  placement EXAM: PORTABLE CHEST 1 VIEW COMPARISON:  08/15/2021 FINDINGS: Cardiac shadow is enlarged. Endotracheal tube is noted at the level of the carina directed towards the right mainstem bronchus. This should be withdrawn at least 3-4 cm. Mild vascular congestion is noted. Left-sided pleural effusion is seen as well. IMPRESSION: Endotracheal tube directed towards right mainstem bronchus. This should be withdrawn several cm. Vascular congestion with left-sided effusion. Electronically Signed   By: Inez Catalina M.D.   On: 10/05/2022 02:13   CT HEAD CODE STROKE WO CONTRAST  Result Date: 10/17/2022 CLINICAL DATA:  Left facial droop, left-sided paralysis and rightward gaze deviation. EXAM: CT HEAD WITHOUT CONTRAST CT ANGIOGRAPHY OF THE HEAD AND NECK TECHNIQUE: Contiguous axial images were obtained from the base of the skull through the vertex without intravenous contrast. Multidetector CT imaging of the head and neck was performed using the standard protocol during bolus administration of intravenous contrast. Multiplanar CT image reconstructions and MIPs were obtained to evaluate the vascular anatomy. Carotid stenosis measurements (when applicable) are obtained utilizing NASCET criteria, using the distal internal carotid diameter as the denominator. RADIATION DOSE REDUCTION: This exam was performed according to the departmental dose-optimization program which includes automated exposure control, adjustment of the mA and/or kV according to patient size and/or use of iterative reconstruction technique. CONTRAST:  57m OMNIPAQUE IOHEXOL 350 MG/ML SOLN COMPARISON:  None Available. FINDINGS: CT HEAD FINDINGS Brain: There is no mass, hemorrhage or extra-axial collection. The size and configuration of the ventricles and extra-axial CSF spaces are normal. There is mild hypoattenuation in the right corona radiata and at the right frontal periventricular white matter, new since 02/01/2021. Vascular: No abnormal  hyperdensity of the major intracranial arteries or dural venous sinuses. No intracranial atherosclerosis. Skull: The visualized skull base, calvarium and extracranial soft tissues are normal. Sinuses/Orbits: No fluid levels or advanced mucosal thickening of the visualized paranasal sinuses. No mastoid or middle ear effusion. The orbits are normal. ASPECTS ([place] Early CT Score) - Ganglionic level infarction (caudate, lentiform nuclei, internal capsule, insula, M1-M3 cortex): 5 - Supraganglionic infarction (M4-M6 cortex): 3 Total score (0-10 with 10 being normal): 8 CTA NECK FINDINGS SKELETON: There is no bony spinal canal stenosis. No lytic or blastic lesion. OTHER NECK: Normal pharynx, larynx and major salivary glands. No cervical lymphadenopathy. Unremarkable thyroid gland. UPPER CHEST: No pneumothorax or pleural effusion. No nodules or masses. AORTIC ARCH: There is no calcific atherosclerosis of the aortic arch. There is no aneurysm, dissection or hemodynamically significant stenosis of the visualized portion of the aorta. Conventional 3 vessel aortic branching pattern. The visualized proximal subclavian arteries are widely patent. RIGHT CAROTID SYSTEM: Normal without aneurysm, dissection or stenosis. LEFT CAROTID SYSTEM: Normal without aneurysm, dissection or stenosis. VERTEBRAL ARTERIES: Left dominant configuration. Both origins are clearly patent. There is no dissection, occlusion or flow-limiting stenosis to the skull base (V1-V3 segments). CTA HEAD FINDINGS POSTERIOR CIRCULATION: --Vertebral arteries: Normal V4 segments. --Inferior cerebellar arteries: Normal. --Basilar  artery: Normal. --Superior cerebellar arteries: Normal. --Posterior cerebral arteries (PCA): Normal. ANTERIOR CIRCULATION: --Intracranial internal carotid arteries: Normal. --Anterior cerebral arteries (ACA): Normal. Both A1 segments are present. Patent anterior communicating artery (a-comm). --Middle cerebral arteries (MCA):  Paucity of vascularity throughout much of the right MCA territory, specifically the posterior superior division and all of the inferior division. There is no clear cut off, but the branches are likely occluded at the MCA bifurcation. Minimal collateralization. Left MCA is normal. VENOUS SINUSES: As permitted by contrast timing, patent. ANATOMIC VARIANTS: None Review of the MIP images confirms the above findings. IMPRESSION: 1. No intracranial hemorrhage. ASPECTS is 8. 2. 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 These results were communicated to Dr. Amie Portland at 8:25 pm on 09/21/2022 by text page via the [facility] messaging system. Electronically Signed   By: Ulyses Jarred M.D.   On: 09/24/2022 20:32   CT ANGIO HEAD NECK W WO CM (CODE STROKE)  Result Date: 10/15/2022 CLINICAL DATA:  Left facial droop, left-sided paralysis and rightward gaze deviation. EXAM: CT HEAD WITHOUT CONTRAST CT ANGIOGRAPHY OF THE HEAD AND NECK TECHNIQUE: Contiguous axial images were obtained from the base of the skull through the vertex without intravenous contrast. Multidetector CT imaging of the head and neck was performed using the standard protocol during bolus administration of intravenous contrast. Multiplanar CT image reconstructions and MIPs were obtained to evaluate the vascular anatomy. Carotid stenosis measurements (when applicable) are obtained utilizing NASCET criteria, using the distal internal carotid diameter as the denominator. RADIATION DOSE REDUCTION: This exam was performed according to the departmental dose-optimization program which includes automated exposure control, adjustment of the mA and/or kV according to patient size and/or use of iterative reconstruction technique. CONTRAST:  33m OMNIPAQUE IOHEXOL 350 MG/ML SOLN COMPARISON:  None Available. FINDINGS: CT HEAD FINDINGS Brain: There is no mass, hemorrhage or extra-axial collection. The size and configuration of the ventricles and extra-axial  CSF spaces are normal. There is mild hypoattenuation in the right corona radiata and at the right frontal periventricular white matter, new since 02/01/2021. Vascular: No abnormal hyperdensity of the major intracranial arteries or dural venous sinuses. No intracranial atherosclerosis. Skull: The visualized skull base, calvarium and extracranial soft tissues are normal. Sinuses/Orbits: No fluid levels or advanced mucosal thickening of the visualized paranasal sinuses. No mastoid or middle ear effusion. The orbits are normal. ASPECTS ([place] Early CT Score) - Ganglionic level infarction (caudate, lentiform nuclei, internal capsule, insula, M1-M3 cortex): 5 - Supraganglionic infarction (M4-M6 cortex): 3 Total score (0-10 with 10 being normal): 8 CTA NECK FINDINGS SKELETON: There is no bony spinal canal stenosis. No lytic or blastic lesion. OTHER NECK: Normal pharynx, larynx and major salivary glands. No cervical lymphadenopathy. Unremarkable thyroid gland. UPPER CHEST: No pneumothorax or pleural effusion. No nodules or masses. AORTIC ARCH: There is no calcific atherosclerosis of the aortic arch. There is no aneurysm, dissection or hemodynamically significant stenosis of the visualized portion of the aorta. Conventional 3 vessel aortic branching pattern. The visualized proximal subclavian arteries are widely patent. RIGHT CAROTID SYSTEM: Normal without aneurysm, dissection or stenosis. LEFT CAROTID SYSTEM: Normal without aneurysm, dissection or stenosis. VERTEBRAL ARTERIES: Left dominant configuration. Both origins are clearly patent. There is no dissection, occlusion or flow-limiting stenosis to the skull base (V1-V3 segments). CTA HEAD FINDINGS POSTERIOR CIRCULATION: --Vertebral arteries: Normal V4 segments. --Inferior cerebellar arteries: Normal. --Basilar artery: Normal. --Superior cerebellar arteries: Normal. --Posterior cerebral arteries (PCA): Normal. ANTERIOR CIRCULATION: --Intracranial internal  carotid arteries: Normal. --Anterior  cerebral arteries (ACA): Normal. Both A1 segments are present. Patent anterior communicating artery (a-comm). --Middle cerebral arteries (MCA): Paucity of vascularity throughout much of the right MCA territory, specifically the posterior superior division and all of the inferior division. There is no clear cut off, but the branches are likely occluded at the MCA bifurcation. Minimal collateralization. Left MCA is normal. VENOUS SINUSES: As permitted by contrast timing, patent. ANATOMIC VARIANTS: None Review of the MIP images confirms the above findings. IMPRESSION: 1. No intracranial hemorrhage. ASPECTS is 8. 2. 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 These results were communicated to Dr. Amie Portland at 8:25 pm on 10/11/2022 by text page via the [facility] messaging system. Electronically Signed   By: Ulyses Jarred M.D.   On: 10/03/2022 20:32    Microbiology No results found for this or any previous visit (from the past 240 hour(s)).  Lab Basic Metabolic Panel: Recent Labs  Lab 10/13/22 0610 10/13/22 1200 10/14/22 0626 10/14/22 1219 10/15/22 0617 10/15/22 1358 10/15/22 1746 10/16/22 0120 10/16/22 0417  NA 156*  157*   < > 156*   < > 160* 161* 159* 160* 159*  K 4.2  --  4.4  --  5.0  --   --   --  4.5  CL >130*  --  >130*  --  >130*  --   --   --  >130*  CO2 17*  --  17*  --  18*  --   --   --  17*  GLUCOSE 123*  --  124*  --  116*  --   --   --  114*  BUN 101*  --  108*  --  112*  --   --   --  108*  CREATININE 3.80*  --  3.69*  --  3.55*  --   --   --  3.19*  CALCIUM 8.0*  --  8.3*  --  8.8*  --   --   --  8.4*   < > = values in this interval not displayed.   CBC: Recent Labs  Lab 10/13/22 0610 10/14/22 0626 10/15/22 0617 10/16/22 0417  WBC 12.7* 13.5* 14.2* 12.7*  HGB 10.7* 10.9* 10.9* 10.7*  HCT 32.9* 33.7* 35.3* 33.5*  MCV 92.7 92.8 94.9 93.8  PLT 147* 149* 170 189   Sepsis Labs: Recent Labs  Lab 10/13/22 0610  10/14/22 0626 10/15/22 0617 10/16/22 0417  WBC 12.7* 13.5* 14.2* 12.7*    Procedures/Operations  Intubation, PICC line placement   Shylo Dillenbeck Nov 05, 2022, 10:10 PM

## 2022-10-22 NOTE — Progress Notes (Signed)
Pt family left bedside. Pt transported to morgue without incident.

## 2022-10-22 NOTE — Progress Notes (Signed)
RN was in the room doing pt care when pt expired.  RN had just done mouth care and rolled pt over to check flexiseal.  When pt was turned back over she had brown liquid substance oozing from her mouth.  RN and NT were cleaning her face when another RN came in and said she was showing no vital signs on the monitor.  Cheron Schaumann, RN and Marcina Millard, RN both pronounced pt at 5:37am.  [name], daughter was notified by telephone of her mothers passing.  Eyes were moistened with saline and all lines and drains were removed from pt.  [place] was contacted and pt a potential donor so preservation was completed.  Medical examiner notified but no case was opened.  Family will not be up to visit until after 7:30am.

## 2022-10-22 DEATH — deceased

## 2023-02-06 NOTE — Telephone Encounter (Signed)
noted
